# Patient Record
Sex: Male | Born: 1965 | Race: White | Hispanic: No | Marital: Married | Smoking: Never smoker
Health system: Southern US, Community
[De-identification: ages and names within clinical notes are randomized; demographics above are authoritative.]

## PROBLEM LIST (undated history)

## (undated) DIAGNOSIS — C801 Malignant (primary) neoplasm, unspecified: Secondary | ICD-10-CM

## (undated) DIAGNOSIS — E079 Disorder of thyroid, unspecified: Secondary | ICD-10-CM

## (undated) DIAGNOSIS — E119 Type 2 diabetes mellitus without complications: Secondary | ICD-10-CM

## (undated) DIAGNOSIS — E7800 Pure hypercholesterolemia, unspecified: Secondary | ICD-10-CM

## (undated) DIAGNOSIS — L509 Urticaria, unspecified: Secondary | ICD-10-CM

## (undated) DIAGNOSIS — C6290 Malignant neoplasm of unspecified testis, unspecified whether descended or undescended: Secondary | ICD-10-CM

## (undated) HISTORY — PX: ORCHIECTOMY: SHX2116

---

## 2020-04-17 ENCOUNTER — Emergency Department (HOSPITAL_BASED_OUTPATIENT_CLINIC_OR_DEPARTMENT_OTHER): Payer: BC Managed Care – PPO

## 2020-04-17 ENCOUNTER — Encounter (HOSPITAL_BASED_OUTPATIENT_CLINIC_OR_DEPARTMENT_OTHER): Payer: Self-pay | Admitting: Emergency Medicine

## 2020-04-17 ENCOUNTER — Other Ambulatory Visit: Payer: Self-pay

## 2020-04-17 ENCOUNTER — Emergency Department (HOSPITAL_BASED_OUTPATIENT_CLINIC_OR_DEPARTMENT_OTHER)
Admission: EM | Admit: 2020-04-17 | Discharge: 2020-04-17 | Disposition: A | Discharge status: Home / Self Care | Payer: BC Managed Care – PPO | Attending: Emergency Medicine | Admitting: Emergency Medicine

## 2020-04-17 DIAGNOSIS — S4991XA Unspecified injury of right shoulder and upper arm, initial encounter: Secondary | ICD-10-CM | POA: Diagnosis present

## 2020-04-17 DIAGNOSIS — S46911A Strain of unspecified muscle, fascia and tendon at shoulder and upper arm level, right arm, initial encounter: Secondary | ICD-10-CM | POA: Diagnosis not present

## 2020-04-17 DIAGNOSIS — X58XXXA Exposure to other specified factors, initial encounter: Secondary | ICD-10-CM | POA: Diagnosis not present

## 2020-04-17 DIAGNOSIS — Y92002 Bathroom of unspecified non-institutional (private) residence single-family (private) house as the place of occurrence of the external cause: Secondary | ICD-10-CM | POA: Diagnosis not present

## 2020-04-17 DIAGNOSIS — Z859 Personal history of malignant neoplasm, unspecified: Secondary | ICD-10-CM | POA: Insufficient documentation

## 2020-04-17 HISTORY — DX: Malignant (primary) neoplasm, unspecified: C80.1

## 2020-04-17 IMAGING — CR DG SHOULDER 2+V*R*
3 series · 3 of 3 positions shown · non-contrast
Comparison: None

CLINICAL DATA: Pain

EXAM:
RIGHT SHOULDER - 2+ VIEW

[w shoulder grashey right]
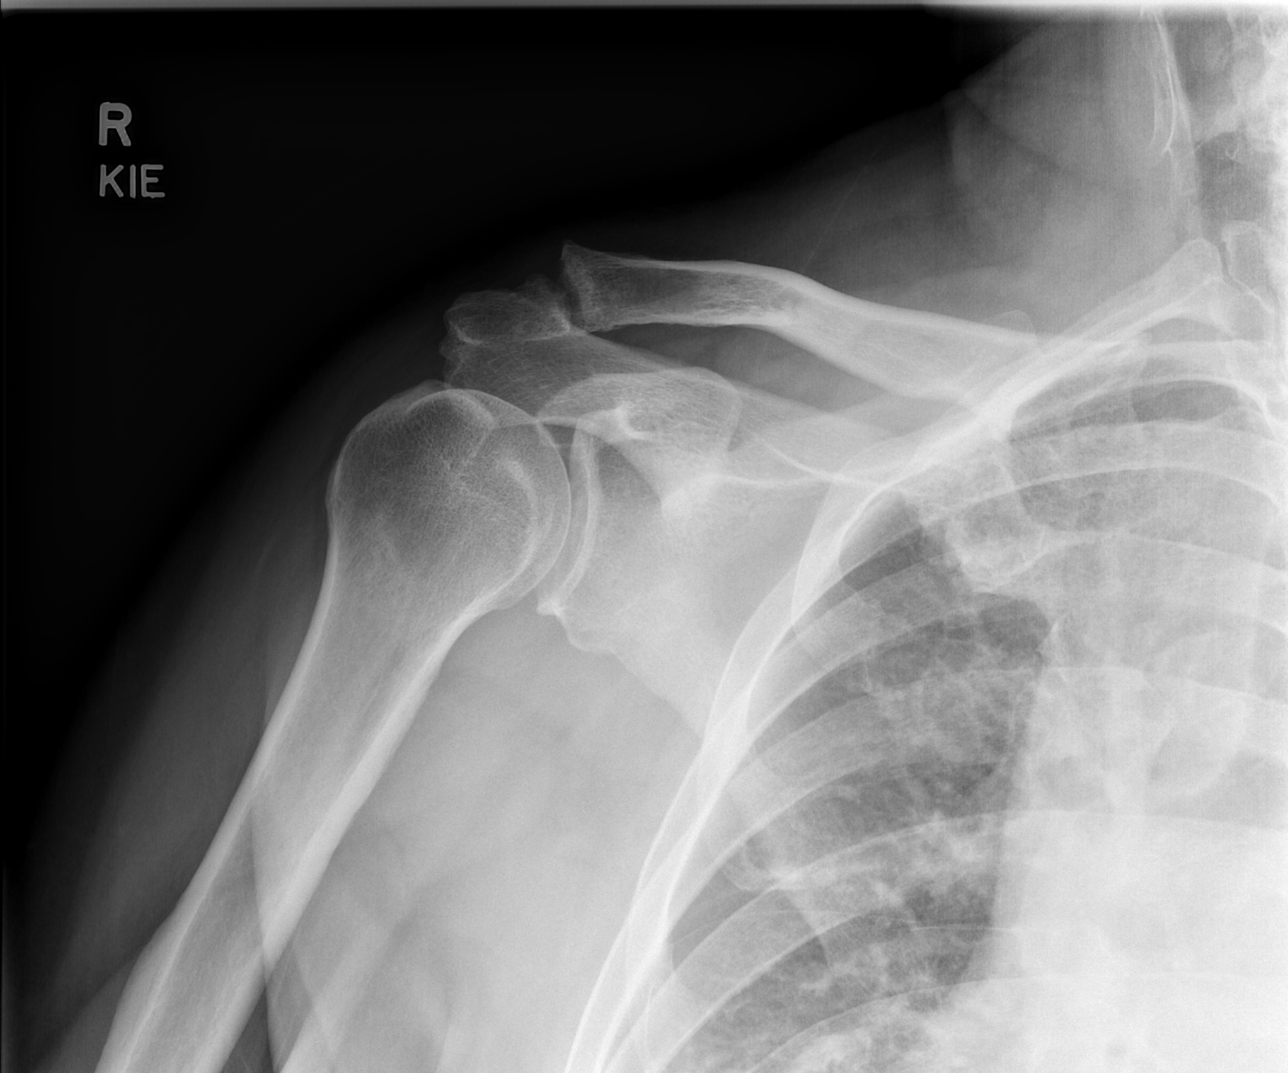

[w shoulder y view right]
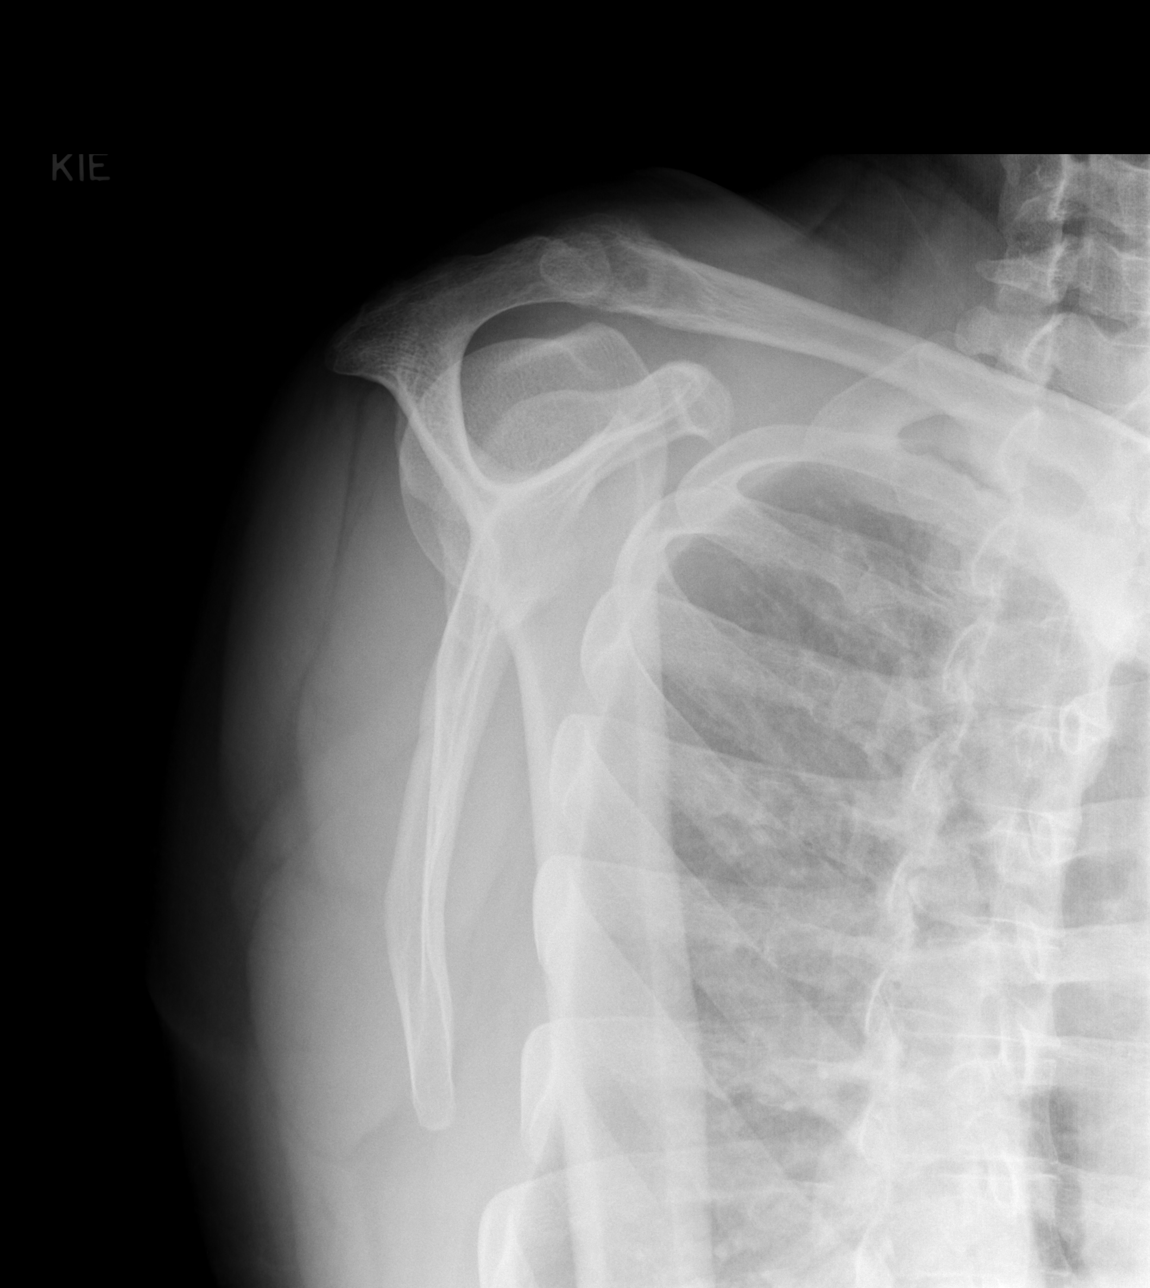

[w shoulder ap internal righ]
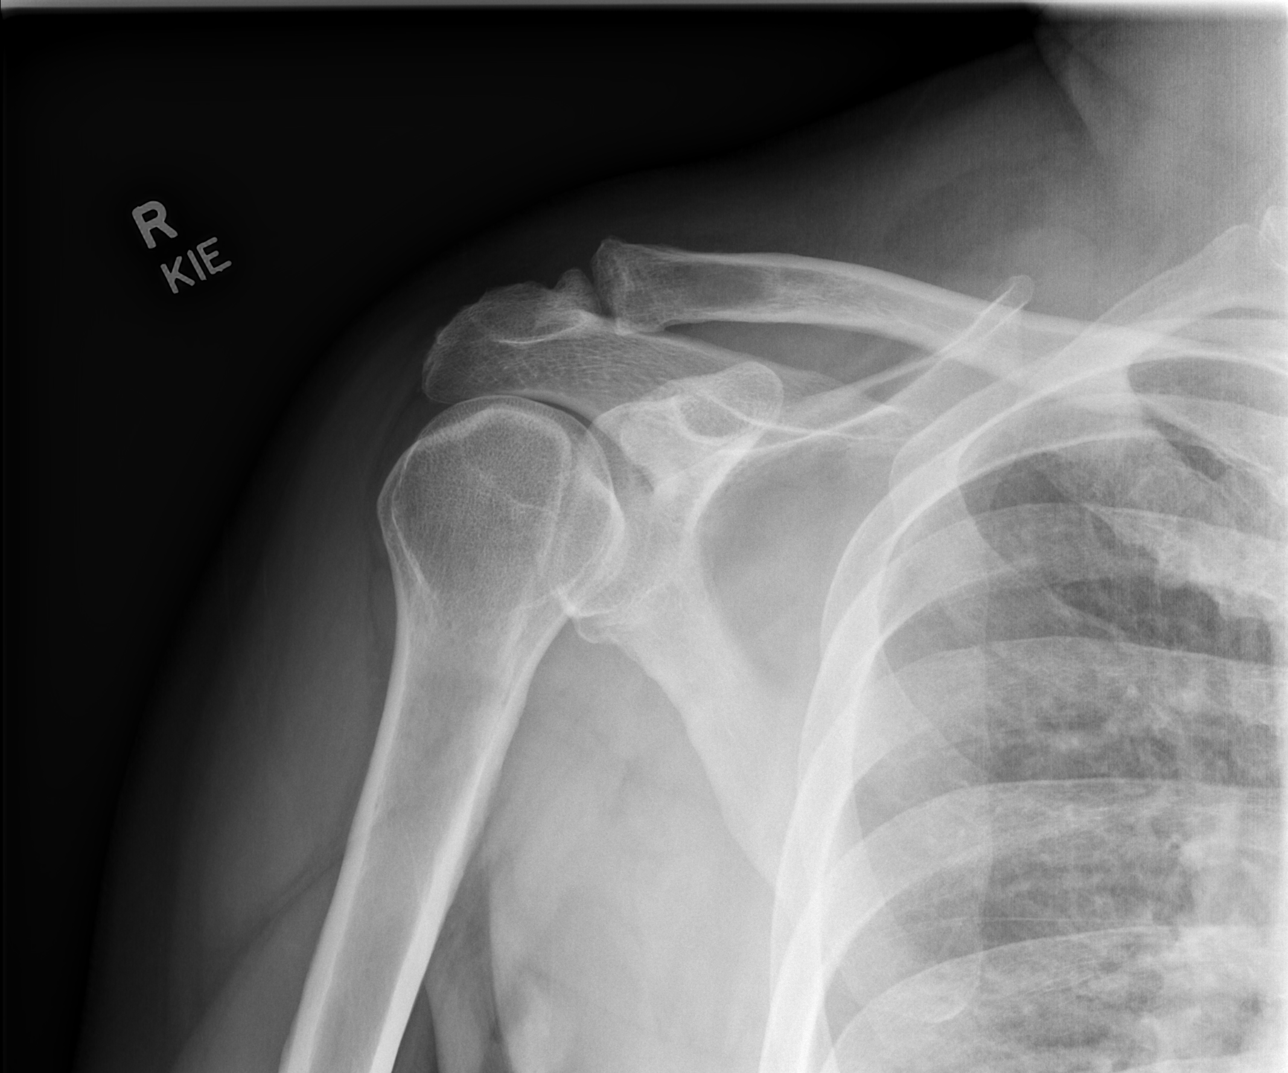

[3 of 3 positions shown; findings below may reference images not displayed]

FINDINGS: Frontal, oblique, and Y scapular images were obtained. No fracture
or dislocation. There is moderate generalized joint space narrowing.
No erosive change or intra-articular calcification. Visualized right
lung clear.
IMPRESSION: Moderate generalized osteoarthritic change. No fracture or
dislocation.

## 2020-04-17 MED ORDER — LIDOCAINE 5 % EX PTCH: 1.0000 | MEDICATED_PATCH | CUTANEOUS | 0 refills | Status: AC

## 2020-04-17 MED ORDER — HYDROCODONE-ACETAMINOPHEN 5-325 MG PO TABS
2.0000 | ORAL_TABLET | ORAL | 0 refills | Status: DC | PRN
Start: 2020-04-17 — End: 2020-04-22

## 2020-04-17 MED ORDER — LIDOCAINE 5 % EX PTCH
1.0000 | MEDICATED_PATCH | CUTANEOUS | Status: DC
  Administered 2020-04-17: 1 via TRANSDERMAL
  Filled 2020-04-17: qty 1

## 2020-04-17 MED ORDER — MELOXICAM 7.5 MG PO TABS: 7.5000 mg | ORAL_TABLET | Freq: Every day | ORAL | 0 refills | Status: DC

## 2020-04-17 MED ORDER — METHOCARBAMOL 500 MG PO TABS: 500.0000 mg | ORAL_TABLET | Freq: Two times a day (BID) | ORAL | 0 refills | Status: DC

## 2020-04-17 MED ORDER — HYDROCODONE-ACETAMINOPHEN 5-325 MG PO TABS
1.0000 | ORAL_TABLET | Freq: Once | ORAL | Status: AC
  Administered 2020-04-17: 1 via ORAL
  Filled 2020-04-17: qty 1

## 2020-04-17 NOTE — ED Provider Notes (Signed)
[facility] Provider Note   CSN: [id number] Arrival date & time: 04/17/20  1057     History Chief Complaint  Patient presents with  . Shoulder Pain    Jonathan Shaffer is a 54 y.o. male.  54 year old male presents with complaint of right shoulder pain.  Patient states that he had a stomach bug last week and was leaning over the toilet holding onto the toilet with both arms.  Patient woke up the next day with muscle soreness in his abdomen as well as in his right shoulder.  Patient states the muscle discomfort in his abdomen has since totally resolved and he has no more GI symptoms however continues to have pain in his right shoulder.  Patient is taking ibuprofen without improvement in his symptoms, pain is worse with palpation around the shoulder as well as with any range of motion of the shoulder.  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        Past Medical History:  Diagnosis Date  . Cancer ([place])     There are no problems to display for this patient.   History reviewed. No pertinent surgical history.     No family history on file.  Social History   Tobacco Use  . Smoking status: Never Smoker  . Smokeless tobacco: Never Used  Substance Use Topics  . Alcohol use: Yes  . Drug use: Never    Home Medications Prior to Admission medications   Medication Sig Start Date End Date Taking? Authorizing Provider  HYDROcodone-acetaminophen (NORCO/VICODIN) 5-325 MG tablet Take 2 tablets by mouth every 4 (four) hours as needed. 04/17/20   Tacy Learn, PA-C  lidocaine (LIDODERM) 5 % Place 1 patch onto the skin daily. Remove & Discard patch within 12 hours or as directed by MD 04/17/20   Tacy Learn, PA-C  meloxicam (MOBIC) 7.5 MG tablet Take 1 tablet (7.5 mg total) by mouth daily for 10 days. 04/17/20 04/23/2020  Tacy Learn, PA-C  methocarbamol (ROBAXIN) 500 MG tablet Take 1 tablet (500 mg total) by mouth 2 (two) times daily. 04/17/20   Tacy Learn, PA-C    Allergies    Patient has no known allergies.  Review of Systems   Review of Systems  Constitutional: Negative for fever.  Respiratory: Negative for shortness of breath.   Cardiovascular: Negative for chest pain.  Gastrointestinal: Negative for abdominal pain, diarrhea, nausea and vomiting.  Musculoskeletal: Positive for arthralgias, myalgias and neck pain.  Skin: Negative for rash and wound.  Allergic/Immunologic: Negative for immunocompromised state.  Neurological: Negative for numbness.  All other systems reviewed and are negative.   Physical Exam Updated Vital Signs BP (!) 139/91 (BP Location: Left Arm)   Pulse 93   Temp 99.2 F (37.3 C) (Oral)   Resp 18   Ht 6\' 3"  (1.905 m)   Wt 117.9 kg   SpO2 98%   BMI 32.50 kg/m   Physical Exam Vitals and nursing note reviewed.  Constitutional:      General: He is not in acute distress.    Appearance: He is well-developed. He is not diaphoretic.  HENT:     Head: Normocephalic and atraumatic.  Cardiovascular:     Pulses: Normal pulses.  Pulmonary:     Effort: Pulmonary effort is normal.  Musculoskeletal:        General: Tenderness present. No  swelling.     Right shoulder: Tenderness present. No swelling, deformity, bony tenderness or crepitus. Decreased range of motion.     Cervical back: No bony tenderness.     Thoracic back: No tenderness or bony tenderness.       Back:     Comments: generalized tenderness of right shoulder, specifically along the right cervical paraspinous and right trapezius along medial border of right scapula and right AC.  Pain is worse with range of motion of the right shoulder, unable to internally or externally rotate secondary to pain, range of motion all feels significantly limited active and passive range of motion.  Skin:    General: Skin is warm  and dry.     Findings: No erythema or rash.  Neurological:     Mental Status: He is alert and oriented to person, place, and time.  Psychiatric:        Behavior: Behavior normal.     ED Results / Procedures / Treatments   Labs (all labs ordered are listed, but only abnormal results are displayed) Labs Reviewed - No data to display  EKG None  Radiology DG Shoulder Right  Result Date: 04/17/2020 CLINICAL DATA:  Pain EXAM: RIGHT SHOULDER - 2+ VIEW COMPARISON:  None FINDINGS: Frontal, oblique, and Y scapular images were obtained. No fracture or dislocation. There is moderate generalized joint space narrowing. No erosive change or intra-articular calcification. Visualized right lung clear. IMPRESSION: Moderate generalized osteoarthritic change. No fracture or dislocation. Electronically Signed   By: Lowella Grip III M.D.   On: 04/17/2020 11:50    Procedures Procedures (including critical care time)  Medications Ordered in ED Medications  lidocaine (LIDODERM) 5 % 1 patch (1 patch Transdermal Patch Applied 04/17/20 1243)  HYDROcodone-acetaminophen (NORCO/VICODIN) 5-325 MG per tablet 1 tablet (1 tablet Oral Given 04/17/20 1240)    ED Course  I have reviewed the triage vital signs and the nursing notes.  Pertinent labs & imaging results that were available during my care of the patient were reviewed by me and considered in my medical decision making (see chart for details).  Clinical Course as of Apr 17 1253  Nancy Fetter Apr 17, 5069  [id number] 54 year old male with complaint of right shoulder pain, not improving with rest, sling, ibuprofen at home. X-ray shows arthritic changes without dislocation or fracture.  Found to have tenderness general right shoulder as well as right trapezius and right paraspinous muscles, pain significantly in its range of motion. Plan is for Robaxin and meloxicam at home as well as Lidoderm patches.  Given Norco for limited use for pain not relieved with Robaxin  and meloxicam.  Advised to use Colace for constipation.  Recommend warm compresses followed by gentle range of motion exercises as discussed.  Recommend early follow-up with sports medicine, discussed potential to develop adhesive capsulitis. Patient has a sports medicine provider and will contact them tomorrow for follow-up.   [LM]    Clinical Course User Index [LM] Roque Lias   MDM Rules/Calculators/A&P                          Final Clinical Impression(s) / ED Diagnoses Final diagnoses:  Strain of right shoulder, initial encounter    Rx / DC Orders ED Discharge Orders         Ordered    HYDROcodone-acetaminophen (NORCO/VICODIN) 5-325 MG tablet  Every 4 hours PRN        04/17/20 1227  lidocaine (LIDODERM) 5 %  Every 24 hours        04/17/20 1227    methocarbamol (ROBAXIN) 500 MG tablet  2 times daily        04/17/20 1227    meloxicam (MOBIC) 7.5 MG tablet  Daily        04/17/20 1227           Tacy Learn, PA-C 04/17/20 1254    Wyvonnia Dusky, MD 04/17/20 1729

## 2020-04-17 NOTE — Discharge Instructions (Addendum)
Take meloxicam daily as prescribed.  Do not take other NSAIDs while taking meloxicam. Take Robaxin as needed as prescribed, this is a muscle relaxer, do not drive or operate machinery taking Robaxin. Take Norco for pain not controlled with your meloxicam and Robaxin.  Do not drive or operate machinery while taking Norco.  Muscle relaxers and narcotic pain medications can cause constipation, take Colace if taking these medications. Apply Lidoderm patch as needed as prescribed. Recommend warm compresses for 20 minutes followed by gentle stretching as discussed. Follow-up with your sports medicine provider.

## 2020-04-17 NOTE — ED Triage Notes (Signed)
States he has had shoulder pain for several days. No known injury but states he had a GI bug and possibly hurt it while vomiting. He is wearing a sling.

## 2020-04-19 ENCOUNTER — Other Ambulatory Visit: Payer: Self-pay

## 2020-04-19 ENCOUNTER — Ambulatory Visit (INDEPENDENT_AMBULATORY_CARE_PROVIDER_SITE_OTHER): Payer: BC Managed Care – PPO | Admitting: Family Medicine

## 2020-04-19 ENCOUNTER — Encounter: Payer: Self-pay | Admitting: Family Medicine

## 2020-04-19 ENCOUNTER — Ambulatory Visit: Payer: BC Managed Care – PPO

## 2020-04-19 DIAGNOSIS — M25511 Pain in right shoulder: Secondary | ICD-10-CM | POA: Diagnosis not present

## 2020-04-19 MED ORDER — HYDROCODONE-ACETAMINOPHEN 5-325 MG PO TABS
1.0000 | ORAL_TABLET | Freq: Every evening | ORAL | 0 refills | Status: DC | PRN
Start: 2020-04-19 — End: 2020-04-22

## 2020-04-19 MED ORDER — TRAMADOL HCL 50 MG PO TABS
50.0000 mg | ORAL_TABLET | Freq: Four times a day (QID) | ORAL | 0 refills | Status: AC | PRN
Start: 2020-04-19 — End: ?

## 2020-04-19 NOTE — Progress Notes (Signed)
   Office Visit Note   Patient: Jonathan Shaffer           Date of Birth: 1966/05/22           MRN: [id number] Visit Date: 04/19/2020 Requested by: No referring provider defined for this encounter. PCP: System, Provider Not In  Subjective: Chief Complaint  Patient presents with  . Right Shoulder - Pain    HPI: He is here with right shoulder pain. He is right-hand dominant. Last week he ate some seafood, developed food poisoning and vomited violently for about 24 hours. Then everything seemed to be okay, but a day later he started noticing pain in his right shoulder which has progressively worsened. Now he can hardly move it because of pain. Pain seems to be mostly on the anterior aspect. 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 He was given some medications which have not eliminated his pain. Denies any neck pain, denies any numbness or weakness in his arm.               ROS:   All other systems were reviewed and are negative.  Objective: Vital Signs: There were no vitals taken for this visit.  Physical Exam:  General:  Alert and oriented, in no acute distress. Pulm:  Breathing unlabored. Psy:  Normal mood, congruent affect. Skin: No erythema or rash Right shoulder: Very limited active range of motion because of pain. Passive range of motion hurts equally. He is tender over the [facility] joint and in the lateral and posterior subacromial spaces. Diffusely tender around the shoulder. Unable to test rotator cuff strength due to pain.  Imaging: US Guided Needle Placement  Result Date: 04/19/2020 I briefly attempted diagnostic imaging but he was unable to tolerate positioning. Ultrasound guided injection is preferred based studies that show increased duration, increased effect, greater accuracy, decreased procedural pain, increased response rate, and decreased cost with ultrasound guided versus blind injection.   Verbal informed consent  obtained.  Time-out conducted.  Noted no overlying erythema, induration, or other signs of local infection. Ultrasound-guided right glenohumeral injection: After sterile prep with Betadine, injected 8 cc 1% lidocaine without epinephrine and 40 mg methylprednisolone using a 22-gauge spinal needle, passing the needle from posterior approach into the glenohumeral joint. Injectate was seen filling the joint capsule. He had very good immediate relief.    Assessment & Plan: 1. Acute right shoulder pain, suspect inflammatory reaction due to recent illness -Discussed options with him and elected to do an ultrasound-guided intra-articular injection today. He will follow-up as needed. MRI scan if he fails to improve.     Procedures: No procedures performed  No notes on file     PMFS History: There are no problems to display for this patient.  Past Medical History:  Diagnosis Date  . Cancer [facility])     No family history on file.  No past surgical history on file. Social History   Occupational History  . Not on file  Tobacco Use  . Smoking status: Never Smoker  . Smokeless tobacco: Never Used  Substance and Sexual Activity  . Alcohol use: Yes  . Drug use: Never  . Sexual activity: Not on file

## 2020-04-21 ENCOUNTER — Other Ambulatory Visit: Payer: Self-pay | Admitting: Family Medicine

## 2020-04-21 MED ORDER — PREDNISONE 10 MG PO TABS: ORAL_TABLET | ORAL | 0 refills | Status: AC

## 2020-04-22 ENCOUNTER — Emergency Department (HOSPITAL_BASED_OUTPATIENT_CLINIC_OR_DEPARTMENT_OTHER): Payer: BC Managed Care – PPO

## 2020-04-22 ENCOUNTER — Inpatient Hospital Stay: Payer: Self-pay

## 2020-04-22 ENCOUNTER — Other Ambulatory Visit: Payer: Self-pay

## 2020-04-22 ENCOUNTER — Inpatient Hospital Stay (HOSPITAL_BASED_OUTPATIENT_CLINIC_OR_DEPARTMENT_OTHER)
Admission: EM | Admit: 2020-04-22 | Discharge: 2020-06-04 | DRG: 853 | Disposition: E | Payer: BC Managed Care – PPO | Attending: Cardiothoracic Surgery | Admitting: Cardiothoracic Surgery

## 2020-04-22 ENCOUNTER — Encounter (HOSPITAL_BASED_OUTPATIENT_CLINIC_OR_DEPARTMENT_OTHER): Payer: Self-pay | Admitting: *Deleted

## 2020-04-22 ENCOUNTER — Inpatient Hospital Stay (HOSPITAL_COMMUNITY): Payer: BC Managed Care – PPO

## 2020-04-22 DIAGNOSIS — E1152 Type 2 diabetes mellitus with diabetic peripheral angiopathy with gangrene: Secondary | ICD-10-CM | POA: Diagnosis present

## 2020-04-22 DIAGNOSIS — L270 Generalized skin eruption due to drugs and medicaments taken internally: Secondary | ICD-10-CM | POA: Diagnosis not present

## 2020-04-22 DIAGNOSIS — R7881 Bacteremia: Secondary | ICD-10-CM | POA: Diagnosis not present

## 2020-04-22 DIAGNOSIS — E440 Moderate protein-calorie malnutrition: Secondary | ICD-10-CM | POA: Diagnosis not present

## 2020-04-22 DIAGNOSIS — F05 Delirium due to known physiological condition: Secondary | ICD-10-CM | POA: Diagnosis not present

## 2020-04-22 DIAGNOSIS — M549 Dorsalgia, unspecified: Secondary | ICD-10-CM | POA: Diagnosis not present

## 2020-04-22 DIAGNOSIS — E872 Acidosis: Secondary | ICD-10-CM | POA: Diagnosis present

## 2020-04-22 DIAGNOSIS — R578 Other shock: Secondary | ICD-10-CM | POA: Diagnosis not present

## 2020-04-22 DIAGNOSIS — D689 Coagulation defect, unspecified: Secondary | ICD-10-CM | POA: Diagnosis present

## 2020-04-22 DIAGNOSIS — D75838 Other thrombocytosis: Secondary | ICD-10-CM | POA: Diagnosis not present

## 2020-04-22 DIAGNOSIS — N170 Acute kidney failure with tubular necrosis: Secondary | ICD-10-CM | POA: Diagnosis present

## 2020-04-22 DIAGNOSIS — J9811 Atelectasis: Secondary | ICD-10-CM | POA: Diagnosis not present

## 2020-04-22 DIAGNOSIS — I495 Sick sinus syndrome: Secondary | ICD-10-CM | POA: Diagnosis not present

## 2020-04-22 DIAGNOSIS — A419 Sepsis, unspecified organism: Secondary | ICD-10-CM | POA: Diagnosis not present

## 2020-04-22 DIAGNOSIS — R609 Edema, unspecified: Secondary | ICD-10-CM | POA: Diagnosis not present

## 2020-04-22 DIAGNOSIS — L02413 Cutaneous abscess of right upper limb: Secondary | ICD-10-CM

## 2020-04-22 DIAGNOSIS — E1110 Type 2 diabetes mellitus with ketoacidosis without coma: Secondary | ICD-10-CM | POA: Diagnosis present

## 2020-04-22 DIAGNOSIS — D72829 Elevated white blood cell count, unspecified: Secondary | ICD-10-CM | POA: Diagnosis not present

## 2020-04-22 DIAGNOSIS — Y92239 Unspecified place in hospital as the place of occurrence of the external cause: Secondary | ICD-10-CM | POA: Diagnosis not present

## 2020-04-22 DIAGNOSIS — I823 Embolism and thrombosis of renal vein: Secondary | ICD-10-CM | POA: Diagnosis not present

## 2020-04-22 DIAGNOSIS — R6520 Severe sepsis without septic shock: Secondary | ICD-10-CM | POA: Diagnosis not present

## 2020-04-22 DIAGNOSIS — I468 Cardiac arrest due to other underlying condition: Secondary | ICD-10-CM | POA: Diagnosis not present

## 2020-04-22 DIAGNOSIS — G9341 Metabolic encephalopathy: Secondary | ICD-10-CM | POA: Diagnosis present

## 2020-04-22 DIAGNOSIS — Z791 Long term (current) use of non-steroidal anti-inflammatories (NSAID): Secondary | ICD-10-CM

## 2020-04-22 DIAGNOSIS — M60011 Infective myositis, right shoulder: Secondary | ICD-10-CM | POA: Diagnosis present

## 2020-04-22 DIAGNOSIS — L03114 Cellulitis of left upper limb: Secondary | ICD-10-CM | POA: Diagnosis present

## 2020-04-22 DIAGNOSIS — E875 Hyperkalemia: Secondary | ICD-10-CM | POA: Diagnosis not present

## 2020-04-22 DIAGNOSIS — R6521 Severe sepsis with septic shock: Secondary | ICD-10-CM | POA: Diagnosis present

## 2020-04-22 DIAGNOSIS — M62830 Muscle spasm of back: Secondary | ICD-10-CM | POA: Diagnosis not present

## 2020-04-22 DIAGNOSIS — E871 Hypo-osmolality and hyponatremia: Secondary | ICD-10-CM | POA: Diagnosis present

## 2020-04-22 DIAGNOSIS — I130 Hypertensive heart and chronic kidney disease with heart failure and stage 1 through stage 4 chronic kidney disease, or unspecified chronic kidney disease: Secondary | ICD-10-CM | POA: Diagnosis present

## 2020-04-22 DIAGNOSIS — J95821 Acute postprocedural respiratory failure: Secondary | ICD-10-CM | POA: Diagnosis not present

## 2020-04-22 DIAGNOSIS — I330 Acute and subacute infective endocarditis: Secondary | ICD-10-CM | POA: Diagnosis present

## 2020-04-22 DIAGNOSIS — I4891 Unspecified atrial fibrillation: Secondary | ICD-10-CM | POA: Diagnosis not present

## 2020-04-22 DIAGNOSIS — R52 Pain, unspecified: Secondary | ICD-10-CM

## 2020-04-22 DIAGNOSIS — E876 Hypokalemia: Secondary | ICD-10-CM | POA: Diagnosis not present

## 2020-04-22 DIAGNOSIS — Z20822 Contact with and (suspected) exposure to covid-19: Secondary | ICD-10-CM | POA: Diagnosis present

## 2020-04-22 DIAGNOSIS — I76 Septic arterial embolism: Secondary | ICD-10-CM | POA: Diagnosis present

## 2020-04-22 DIAGNOSIS — J9601 Acute respiratory failure with hypoxia: Secondary | ICD-10-CM | POA: Diagnosis not present

## 2020-04-22 DIAGNOSIS — J80 Acute respiratory distress syndrome: Secondary | ICD-10-CM | POA: Diagnosis not present

## 2020-04-22 DIAGNOSIS — I313 Pericardial effusion (noninflammatory): Secondary | ICD-10-CM | POA: Diagnosis not present

## 2020-04-22 DIAGNOSIS — E860 Dehydration: Secondary | ICD-10-CM | POA: Diagnosis present

## 2020-04-22 DIAGNOSIS — N179 Acute kidney failure, unspecified: Secondary | ICD-10-CM | POA: Diagnosis not present

## 2020-04-22 DIAGNOSIS — I748 Embolism and thrombosis of other arteries: Secondary | ICD-10-CM | POA: Diagnosis not present

## 2020-04-22 DIAGNOSIS — B9561 Methicillin susceptible Staphylococcus aureus infection as the cause of diseases classified elsewhere: Secondary | ICD-10-CM

## 2020-04-22 DIAGNOSIS — I472 Ventricular tachycardia: Secondary | ICD-10-CM | POA: Diagnosis not present

## 2020-04-22 DIAGNOSIS — M868X1 Other osteomyelitis, shoulder: Secondary | ICD-10-CM | POA: Diagnosis present

## 2020-04-22 DIAGNOSIS — D735 Infarction of spleen: Secondary | ICD-10-CM | POA: Diagnosis not present

## 2020-04-22 DIAGNOSIS — R410 Disorientation, unspecified: Secondary | ICD-10-CM | POA: Diagnosis not present

## 2020-04-22 DIAGNOSIS — R509 Fever, unspecified: Secondary | ICD-10-CM

## 2020-04-22 DIAGNOSIS — I059 Rheumatic mitral valve disease, unspecified: Secondary | ICD-10-CM

## 2020-04-22 DIAGNOSIS — I6310 Cerebral infarction due to embolism of unspecified precerebral artery: Secondary | ICD-10-CM | POA: Diagnosis not present

## 2020-04-22 DIAGNOSIS — J690 Pneumonitis due to inhalation of food and vomit: Secondary | ICD-10-CM | POA: Diagnosis not present

## 2020-04-22 DIAGNOSIS — L989 Disorder of the skin and subcutaneous tissue, unspecified: Secondary | ICD-10-CM

## 2020-04-22 DIAGNOSIS — E785 Hyperlipidemia, unspecified: Secondary | ICD-10-CM | POA: Diagnosis present

## 2020-04-22 DIAGNOSIS — Z8547 Personal history of malignant neoplasm of testis: Secondary | ICD-10-CM

## 2020-04-22 DIAGNOSIS — M00011 Staphylococcal arthritis, right shoulder: Secondary | ICD-10-CM | POA: Diagnosis not present

## 2020-04-22 DIAGNOSIS — Z452 Encounter for adjustment and management of vascular access device: Secondary | ICD-10-CM

## 2020-04-22 DIAGNOSIS — J810 Acute pulmonary edema: Secondary | ICD-10-CM | POA: Diagnosis not present

## 2020-04-22 DIAGNOSIS — E441 Mild protein-calorie malnutrition: Secondary | ICD-10-CM | POA: Diagnosis not present

## 2020-04-22 DIAGNOSIS — E11649 Type 2 diabetes mellitus with hypoglycemia without coma: Secondary | ICD-10-CM | POA: Diagnosis not present

## 2020-04-22 DIAGNOSIS — D6489 Other specified anemias: Secondary | ICD-10-CM | POA: Diagnosis present

## 2020-04-22 DIAGNOSIS — K567 Ileus, unspecified: Secondary | ICD-10-CM

## 2020-04-22 DIAGNOSIS — Z8249 Family history of ischemic heart disease and other diseases of the circulatory system: Secondary | ICD-10-CM

## 2020-04-22 DIAGNOSIS — I272 Pulmonary hypertension, unspecified: Secondary | ICD-10-CM | POA: Diagnosis present

## 2020-04-22 DIAGNOSIS — E1169 Type 2 diabetes mellitus with other specified complication: Secondary | ICD-10-CM | POA: Diagnosis present

## 2020-04-22 DIAGNOSIS — R001 Bradycardia, unspecified: Secondary | ICD-10-CM | POA: Diagnosis not present

## 2020-04-22 DIAGNOSIS — Z66 Do not resuscitate: Secondary | ICD-10-CM | POA: Diagnosis not present

## 2020-04-22 DIAGNOSIS — E11621 Type 2 diabetes mellitus with foot ulcer: Secondary | ICD-10-CM | POA: Diagnosis present

## 2020-04-22 DIAGNOSIS — M009 Pyogenic arthritis, unspecified: Secondary | ICD-10-CM | POA: Diagnosis present

## 2020-04-22 DIAGNOSIS — M7551 Bursitis of right shoulder: Secondary | ICD-10-CM | POA: Diagnosis present

## 2020-04-22 DIAGNOSIS — T360X5A Adverse effect of penicillins, initial encounter: Secondary | ICD-10-CM | POA: Diagnosis not present

## 2020-04-22 DIAGNOSIS — J90 Pleural effusion, not elsewhere classified: Secondary | ICD-10-CM | POA: Diagnosis not present

## 2020-04-22 DIAGNOSIS — R109 Unspecified abdominal pain: Secondary | ICD-10-CM

## 2020-04-22 DIAGNOSIS — E1310 Other specified diabetes mellitus with ketoacidosis without coma: Secondary | ICD-10-CM | POA: Diagnosis not present

## 2020-04-22 DIAGNOSIS — R739 Hyperglycemia, unspecified: Secondary | ICD-10-CM | POA: Diagnosis not present

## 2020-04-22 DIAGNOSIS — Z6837 Body mass index (BMI) 37.0-37.9, adult: Secondary | ICD-10-CM

## 2020-04-22 DIAGNOSIS — E878 Other disorders of electrolyte and fluid balance, not elsewhere classified: Secondary | ICD-10-CM | POA: Diagnosis not present

## 2020-04-22 DIAGNOSIS — M7989 Other specified soft tissue disorders: Secondary | ICD-10-CM | POA: Diagnosis not present

## 2020-04-22 DIAGNOSIS — Z9889 Other specified postprocedural states: Secondary | ICD-10-CM

## 2020-04-22 DIAGNOSIS — I5021 Acute systolic (congestive) heart failure: Secondary | ICD-10-CM | POA: Diagnosis not present

## 2020-04-22 DIAGNOSIS — D62 Acute posthemorrhagic anemia: Secondary | ICD-10-CM | POA: Diagnosis not present

## 2020-04-22 DIAGNOSIS — I742 Embolism and thrombosis of arteries of the upper extremities: Secondary | ICD-10-CM | POA: Diagnosis not present

## 2020-04-22 DIAGNOSIS — M479 Spondylosis, unspecified: Secondary | ICD-10-CM | POA: Diagnosis present

## 2020-04-22 DIAGNOSIS — I743 Embolism and thrombosis of arteries of the lower extremities: Secondary | ICD-10-CM | POA: Diagnosis not present

## 2020-04-22 DIAGNOSIS — A4101 Sepsis due to Methicillin susceptible Staphylococcus aureus: Secondary | ICD-10-CM | POA: Diagnosis present

## 2020-04-22 DIAGNOSIS — K7200 Acute and subacute hepatic failure without coma: Secondary | ICD-10-CM | POA: Diagnosis present

## 2020-04-22 DIAGNOSIS — N1831 Chronic kidney disease, stage 3a: Secondary | ICD-10-CM | POA: Diagnosis present

## 2020-04-22 DIAGNOSIS — L03818 Cellulitis of other sites: Secondary | ICD-10-CM | POA: Diagnosis not present

## 2020-04-22 DIAGNOSIS — Z01818 Encounter for other preprocedural examination: Secondary | ICD-10-CM

## 2020-04-22 DIAGNOSIS — I455 Other specified heart block: Secondary | ICD-10-CM | POA: Diagnosis present

## 2020-04-22 DIAGNOSIS — I872 Venous insufficiency (chronic) (peripheral): Secondary | ICD-10-CM | POA: Diagnosis present

## 2020-04-22 DIAGNOSIS — I340 Nonrheumatic mitral (valve) insufficiency: Secondary | ICD-10-CM | POA: Diagnosis not present

## 2020-04-22 DIAGNOSIS — I38 Endocarditis, valve unspecified: Secondary | ICD-10-CM | POA: Diagnosis not present

## 2020-04-22 DIAGNOSIS — D649 Anemia, unspecified: Secondary | ICD-10-CM | POA: Diagnosis not present

## 2020-04-22 DIAGNOSIS — I2510 Atherosclerotic heart disease of native coronary artery without angina pectoris: Secondary | ICD-10-CM | POA: Diagnosis present

## 2020-04-22 DIAGNOSIS — E1122 Type 2 diabetes mellitus with diabetic chronic kidney disease: Secondary | ICD-10-CM | POA: Diagnosis present

## 2020-04-22 DIAGNOSIS — L97429 Non-pressure chronic ulcer of left heel and midfoot with unspecified severity: Secondary | ICD-10-CM | POA: Diagnosis present

## 2020-04-22 DIAGNOSIS — L988 Other specified disorders of the skin and subcutaneous tissue: Secondary | ICD-10-CM | POA: Diagnosis not present

## 2020-04-22 DIAGNOSIS — L0291 Cutaneous abscess, unspecified: Secondary | ICD-10-CM

## 2020-04-22 DIAGNOSIS — I081 Rheumatic disorders of both mitral and tricuspid valves: Secondary | ICD-10-CM | POA: Diagnosis present

## 2020-04-22 DIAGNOSIS — J9690 Respiratory failure, unspecified, unspecified whether with hypoxia or hypercapnia: Secondary | ICD-10-CM

## 2020-04-22 DIAGNOSIS — Z833 Family history of diabetes mellitus: Secondary | ICD-10-CM

## 2020-04-22 DIAGNOSIS — Z79899 Other long term (current) drug therapy: Secondary | ICD-10-CM

## 2020-04-22 DIAGNOSIS — G4700 Insomnia, unspecified: Secondary | ICD-10-CM | POA: Diagnosis not present

## 2020-04-22 DIAGNOSIS — Z9079 Acquired absence of other genital organ(s): Secondary | ICD-10-CM | POA: Diagnosis not present

## 2020-04-22 DIAGNOSIS — E7800 Pure hypercholesterolemia, unspecified: Secondary | ICD-10-CM | POA: Diagnosis present

## 2020-04-22 HISTORY — DX: Pure hypercholesterolemia, unspecified: E78.00

## 2020-04-22 HISTORY — DX: Type 2 diabetes mellitus without complications: E11.9

## 2020-04-22 HISTORY — DX: Malignant neoplasm of unspecified testis, unspecified whether descended or undescended: C62.90

## 2020-04-22 HISTORY — DX: Disorder of thyroid, unspecified: E07.9

## 2020-04-22 HISTORY — DX: Urticaria, unspecified: L50.9

## 2020-04-22 LAB — CBC WITH DIFFERENTIAL/PLATELET
Abs Immature Granulocytes: 0.41 10*3/uL — ABNORMAL HIGH (ref 0.00–0.07)
Basophils Absolute: 0 10*3/uL (ref 0.0–0.1)
Basophils Relative: 0 %
Eosinophils Absolute: 0.1 10*3/uL (ref 0.0–0.5)
Eosinophils Relative: 0 %
HCT: 41.6 % (ref 39.0–52.0)
Hemoglobin: 14 g/dL (ref 13.0–17.0)
Immature Granulocytes: 2 %
Lymphocytes Relative: 4 %
Lymphs Abs: 0.8 10*3/uL (ref 0.7–4.0)
MCH: 30 pg (ref 26.0–34.0)
MCHC: 33.7 g/dL (ref 30.0–36.0)
MCV: 89.1 fL (ref 80.0–100.0)
Monocytes Absolute: 0.5 10*3/uL (ref 0.1–1.0)
Monocytes Relative: 3 %
Neutro Abs: 19.5 10*3/uL — ABNORMAL HIGH (ref 1.7–7.7)
Neutrophils Relative %: 91 %
Platelets: 191 10*3/uL (ref 150–400)
RBC: 4.67 MIL/uL (ref 4.22–5.81)
RDW: 14 % (ref 11.5–15.5)
Smear Review: NORMAL
WBC: 21.3 10*3/uL — ABNORMAL HIGH (ref 4.0–10.5)
nRBC: 0 % (ref 0.0–0.2)

## 2020-04-22 LAB — GLUCOSE, CAPILLARY
Glucose-Capillary: 381 mg/dL — ABNORMAL HIGH (ref 70–99)
Glucose-Capillary: 322 mg/dL — ABNORMAL HIGH (ref 70–99)
Glucose-Capillary: 227 mg/dL — ABNORMAL HIGH (ref 70–99)
Glucose-Capillary: 272 mg/dL — ABNORMAL HIGH (ref 70–99)
Glucose-Capillary: 234 mg/dL — ABNORMAL HIGH (ref 70–99)
Glucose-Capillary: 243 mg/dL — ABNORMAL HIGH (ref 70–99)
Glucose-Capillary: 248 mg/dL — ABNORMAL HIGH (ref 70–99)
Glucose-Capillary: 246 mg/dL — ABNORMAL HIGH (ref 70–99)
Glucose-Capillary: 255 mg/dL — ABNORMAL HIGH (ref 70–99)
Glucose-Capillary: 252 mg/dL — ABNORMAL HIGH (ref 70–99)
Glucose-Capillary: 257 mg/dL — ABNORMAL HIGH (ref 70–99)
Glucose-Capillary: 238 mg/dL — ABNORMAL HIGH (ref 70–99)
Glucose-Capillary: 254 mg/dL — ABNORMAL HIGH (ref 70–99)
Glucose-Capillary: 168 mg/dL — ABNORMAL HIGH (ref 70–99)
Glucose-Capillary: 165 mg/dL — ABNORMAL HIGH (ref 70–99)
Glucose-Capillary: 187 mg/dL — ABNORMAL HIGH (ref 70–99)
Glucose-Capillary: 182 mg/dL — ABNORMAL HIGH (ref 70–99)

## 2020-04-22 LAB — BASIC METABOLIC PANEL
Anion gap: 17 — ABNORMAL HIGH (ref 5–15)
Anion gap: 14 (ref 5–15)
Anion gap: 12 (ref 5–15)
Anion gap: 9 (ref 5–15)
Anion gap: 9 (ref 5–15)
BUN: 29 mg/dL — ABNORMAL HIGH (ref 6–20)
BUN: 25 mg/dL — ABNORMAL HIGH (ref 6–20)
BUN: 26 mg/dL — ABNORMAL HIGH (ref 6–20)
BUN: 24 mg/dL — ABNORMAL HIGH (ref 6–20)
BUN: 23 mg/dL — ABNORMAL HIGH (ref 6–20)
CO2: 14 mmol/L — ABNORMAL LOW (ref 22–32)
CO2: 19 mmol/L — ABNORMAL LOW (ref 22–32)
CO2: 21 mmol/L — ABNORMAL LOW (ref 22–32)
CO2: 20 mmol/L — ABNORMAL LOW (ref 22–32)
CO2: 20 mmol/L — ABNORMAL LOW (ref 22–32)
Calcium: 7.6 mg/dL — ABNORMAL LOW (ref 8.9–10.3)
Calcium: 8 mg/dL — ABNORMAL LOW (ref 8.9–10.3)
Calcium: 8 mg/dL — ABNORMAL LOW (ref 8.9–10.3)
Calcium: 7.4 mg/dL — ABNORMAL LOW (ref 8.9–10.3)
Calcium: 7.5 mg/dL — ABNORMAL LOW (ref 8.9–10.3)
Chloride: 89 mmol/L — ABNORMAL LOW (ref 98–111)
Chloride: 93 mmol/L — ABNORMAL LOW (ref 98–111)
Chloride: 95 mmol/L — ABNORMAL LOW (ref 98–111)
Chloride: 98 mmol/L (ref 98–111)
Chloride: 102 mmol/L (ref 98–111)
Creatinine, Ser: 1.51 mg/dL — ABNORMAL HIGH (ref 0.61–1.24)
Creatinine, Ser: 1.29 mg/dL — ABNORMAL HIGH (ref 0.61–1.24)
Creatinine, Ser: 1.48 mg/dL — ABNORMAL HIGH (ref 0.61–1.24)
Creatinine, Ser: 1.41 mg/dL — ABNORMAL HIGH (ref 0.61–1.24)
Creatinine, Ser: 1.33 mg/dL — ABNORMAL HIGH (ref 0.61–1.24)
GFR, Estimated: 55 mL/min — ABNORMAL LOW (ref >60)
GFR, Estimated: >60 mL/min (ref >60)
GFR, Estimated: 56 mL/min — ABNORMAL LOW (ref >60)
GFR, Estimated: 59 mL/min — ABNORMAL LOW (ref >60)
GFR, Estimated: >60 mL/min (ref >60)
Glucose, Bld: 539 mg/dL (ref 70–99)
Glucose, Bld: 255 mg/dL — ABNORMAL HIGH (ref 70–99)
Glucose, Bld: 264 mg/dL — ABNORMAL HIGH (ref 70–99)
Glucose, Bld: 287 mg/dL — ABNORMAL HIGH (ref 70–99)
Glucose, Bld: 198 mg/dL — ABNORMAL HIGH (ref 70–99)
Potassium: 3.6 mmol/L (ref 3.5–5.1)
Potassium: 4.2 mmol/L (ref 3.5–5.1)
Potassium: 3.6 mmol/L (ref 3.5–5.1)
Potassium: 2.9 mmol/L — ABNORMAL LOW (ref 3.5–5.1)
Potassium: 3.3 mmol/L — ABNORMAL LOW (ref 3.5–5.1)
Sodium: 120 mmol/L — ABNORMAL LOW (ref 135–145)
Sodium: 126 mmol/L — ABNORMAL LOW (ref 135–145)
Sodium: 128 mmol/L — ABNORMAL LOW (ref 135–145)
Sodium: 127 mmol/L — ABNORMAL LOW (ref 135–145)
Sodium: 131 mmol/L — ABNORMAL LOW (ref 135–145)

## 2020-04-22 LAB — I-STAT VENOUS BLOOD GAS, ED
Acid-base deficit: 7 mmol/L — ABNORMAL HIGH (ref 0.0–2.0)
Bicarbonate: 18 mmol/L — ABNORMAL LOW (ref 20.0–28.0)
Calcium, Ion: 1.09 mmol/L — ABNORMAL LOW (ref 1.15–1.40)
HCT: 46 % (ref 39.0–52.0)
Hemoglobin: 15.6 g/dL (ref 13.0–17.0)
O2 Saturation: 50 %
Patient temperature: 104.1
Potassium: 4 mmol/L (ref 3.5–5.1)
Sodium: 122 mmol/L — ABNORMAL LOW (ref 135–145)
TCO2: 19 mmol/L — ABNORMAL LOW (ref 22–32)
pCO2, Ven: 38 mmHg — ABNORMAL LOW (ref 44.0–60.0)
pH, Ven: 7.298 (ref 7.250–7.430)
pO2, Ven: 35 mmHg (ref 32.0–45.0)

## 2020-04-22 LAB — URINALYSIS, ROUTINE W REFLEX MICROSCOPIC
Bilirubin Urine: NEGATIVE
Glucose, UA: >=500 mg/dL — AB
Ketones, ur: 40 mg/dL — AB
Leukocytes,Ua: NEGATIVE
Nitrite: NEGATIVE
Protein, ur: NEGATIVE mg/dL
Specific Gravity, Urine: 1.01 (ref 1.005–1.030)
pH: 5.5 (ref 5.0–8.0)

## 2020-04-22 LAB — BLOOD CULTURE ID PANEL (REFLEXED) - BCID2

## 2020-04-22 LAB — RAPID URINE DRUG SCREEN, HOSP PERFORMED
Amphetamines: NOT DETECTED
Barbiturates: NOT DETECTED
Benzodiazepines: NOT DETECTED
Cocaine: NOT DETECTED
Opiates: POSITIVE — AB
Tetrahydrocannabinol: NOT DETECTED

## 2020-04-22 LAB — URINALYSIS, MICROSCOPIC (REFLEX): Squamous Epithelial / HPF: NONE SEEN (ref 0–5)

## 2020-04-22 LAB — LACTIC ACID, PLASMA
Lactic Acid, Venous: 3.3 mmol/L (ref 0.5–1.9)
Lactic Acid, Venous: 2.5 mmol/L (ref 0.5–1.9)
Lactic Acid, Venous: 3 mmol/L (ref 0.5–1.9)
Lactic Acid, Venous: 3.2 mmol/L (ref 0.5–1.9)
Lactic Acid, Venous: 1.7 mmol/L (ref 0.5–1.9)
Lactic Acid, Venous: 1.4 mmol/L (ref 0.5–1.9)

## 2020-04-22 LAB — CBG MONITORING, ED
Glucose-Capillary: 524 mg/dL (ref 70–99)
Glucose-Capillary: 543 mg/dL (ref 70–99)
Glucose-Capillary: 464 mg/dL — ABNORMAL HIGH (ref 70–99)
Glucose-Capillary: 505 mg/dL (ref 70–99)
Glucose-Capillary: 428 mg/dL — ABNORMAL HIGH (ref 70–99)

## 2020-04-22 LAB — AMMONIA: Ammonia: 23 umol/L (ref 9–35)

## 2020-04-22 LAB — C-REACTIVE PROTEIN: CRP: 34.3 mg/dL — ABNORMAL HIGH (ref <1.0)

## 2020-04-22 LAB — HEMOGLOBIN A1C
Hgb A1c MFr Bld: 11.3 % — ABNORMAL HIGH (ref 4.8–5.6)
Mean Plasma Glucose: 277.61 mg/dL

## 2020-04-22 LAB — CK: Total CK: 70 U/L (ref 49–397)

## 2020-04-22 LAB — COMPREHENSIVE METABOLIC PANEL
ALT: 48 U/L — ABNORMAL HIGH (ref 0–44)
AST: 40 U/L (ref 15–41)
Albumin: 2.4 g/dL — ABNORMAL LOW (ref 3.5–5.0)
Alkaline Phosphatase: 133 U/L — ABNORMAL HIGH (ref 38–126)
Anion gap: 21 — ABNORMAL HIGH (ref 5–15)
BUN: 30 mg/dL — ABNORMAL HIGH (ref 6–20)
CO2: 15 mmol/L — ABNORMAL LOW (ref 22–32)
Calcium: 7.8 mg/dL — ABNORMAL LOW (ref 8.9–10.3)
Chloride: 85 mmol/L — ABNORMAL LOW (ref 98–111)
Creatinine, Ser: 1.59 mg/dL — ABNORMAL HIGH (ref 0.61–1.24)
GFR, Estimated: 51 mL/min — ABNORMAL LOW (ref >60)
Glucose, Bld: 572 mg/dL (ref 70–99)
Potassium: 4.1 mmol/L (ref 3.5–5.1)
Sodium: 121 mmol/L — ABNORMAL LOW (ref 135–145)
Total Bilirubin: 2 mg/dL — ABNORMAL HIGH (ref 0.3–1.2)
Total Protein: 6.4 g/dL — ABNORMAL LOW (ref 6.5–8.1)

## 2020-04-22 LAB — LIPASE, BLOOD: Lipase: 35 U/L (ref 11–51)

## 2020-04-22 LAB — RESP PANEL BY RT-PCR (FLU A&B, COVID) ARPGX2
Influenza A by PCR: NEGATIVE
Influenza B by PCR: NEGATIVE
SARS Coronavirus 2 by RT PCR: NEGATIVE

## 2020-04-22 LAB — SEDIMENTATION RATE: Sed Rate: 60 mm/hr — ABNORMAL HIGH (ref 0–16)

## 2020-04-22 LAB — ETHANOL: Alcohol, Ethyl (B): <10 mg/dL (ref <10)

## 2020-04-22 LAB — SALICYLATE LEVEL: Salicylate Lvl: <7 mg/dL — ABNORMAL LOW (ref 7.0–30.0)

## 2020-04-22 LAB — APTT: aPTT: 24 seconds (ref 24–36)

## 2020-04-22 LAB — MRSA PCR SCREENING: MRSA by PCR: NEGATIVE

## 2020-04-22 LAB — PROTIME-INR
INR: 1.3 — ABNORMAL HIGH (ref 0.8–1.2)
Prothrombin Time: 15.7 seconds — ABNORMAL HIGH (ref 11.4–15.2)

## 2020-04-22 LAB — BETA-HYDROXYBUTYRIC ACID
Beta-Hydroxybutyric Acid: 1.5 mmol/L — ABNORMAL HIGH (ref 0.05–0.27)
Beta-Hydroxybutyric Acid: 0.48 mmol/L — ABNORMAL HIGH (ref 0.05–0.27)

## 2020-04-22 LAB — ECHOCARDIOGRAM LIMITED
Height: 75 in
Weight: 4472.69 oz

## 2020-04-22 LAB — HIV ANTIBODY (ROUTINE TESTING W REFLEX): HIV Screen 4th Generation wRfx: NONREACTIVE

## 2020-04-22 LAB — ACETAMINOPHEN LEVEL: Acetaminophen (Tylenol), Serum: 10 ug/mL (ref 10–30)

## 2020-04-22 IMAGING — DX DG CHEST 1V PORT
1 series · 1 of 1 positions shown · non-contrast
Comparison: None.

CLINICAL DATA: Sepsis

EXAM:
PORTABLE CHEST 1 VIEW

[chest ap]
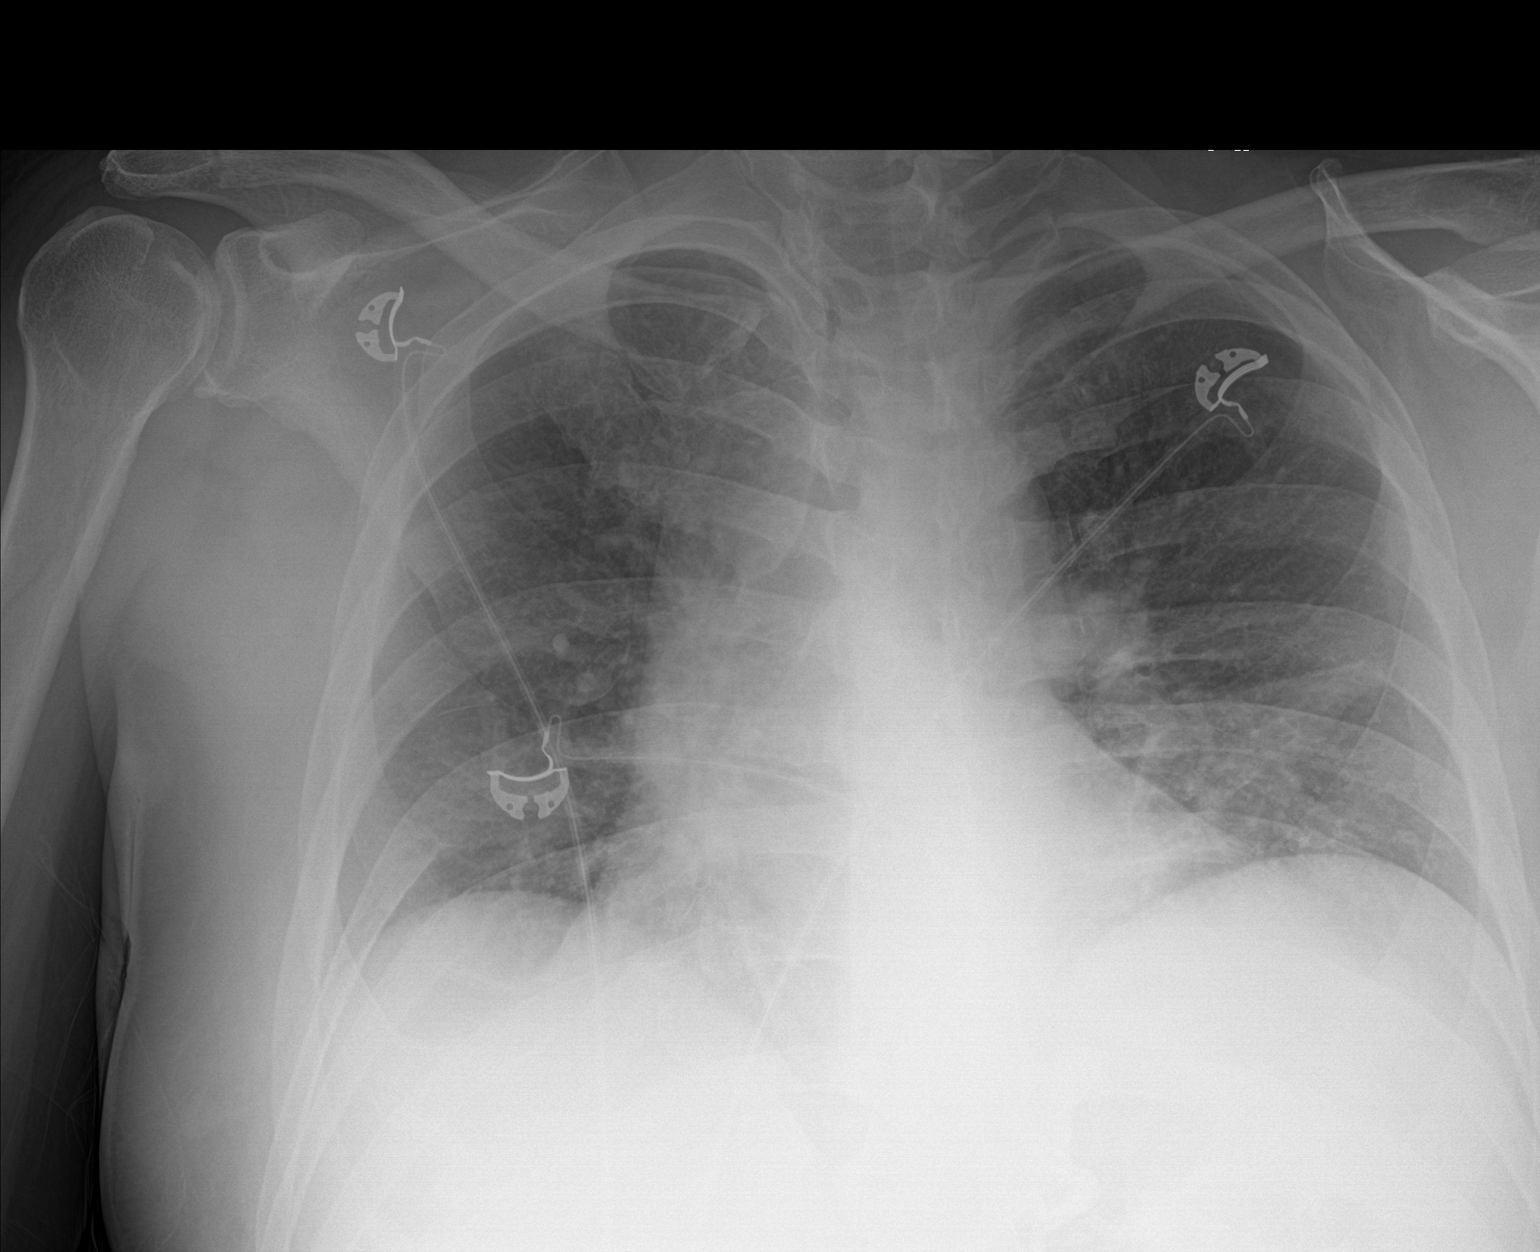

[1 of 1 positions shown; findings below may reference images not displayed]

FINDINGS: Lungs volumes are small, and there is mild left basilar atelectasis.
No pneumothorax or pleural effusion. Cardiac size within normal
limits. Pulmonary vascularity is normal. Osseous structures are
age-appropriate. No acute bone abnormality.
IMPRESSION: No active disease.

## 2020-04-22 IMAGING — CT CT ABD-PELV W/ CM
2 of 5 series · 16 of 46 positions shown, 18 images · IV contrast (omnipaque)
Comparison: None.

CLINICAL DATA: Abdominal pain, fever, vomiting

EXAM:
CT ABDOMEN AND PELVIS WITH CONTRAST
TECHNIQUE: Multidetector CT imaging of the abdomen and pelvis was performed
using the standard protocol following bolus administration of
intravenous contrast.
CONTRAST:  100mL OMNIPAQUE IOHEXOL 300 MG/ML  SOLN

[Series 3: axial st · axial · 0.95mm/px · z∈[-1220,-726]mm · 13 of 113 slices shown, 15 images]
[im 7/113  soft-tissue]
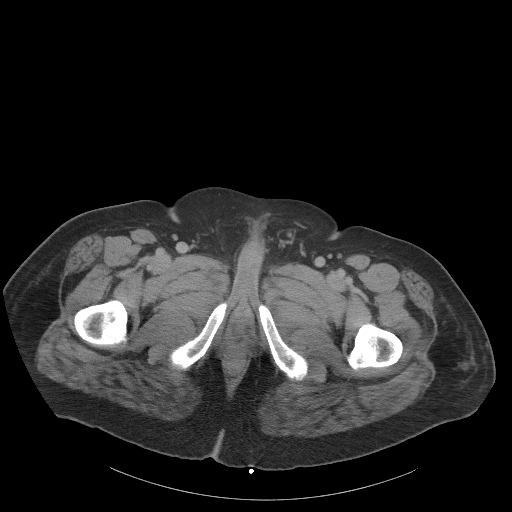
[im 7/113  bone]
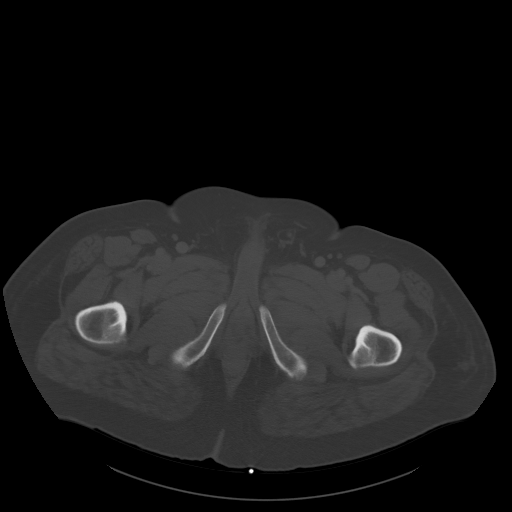
[im 13/113  soft-tissue]
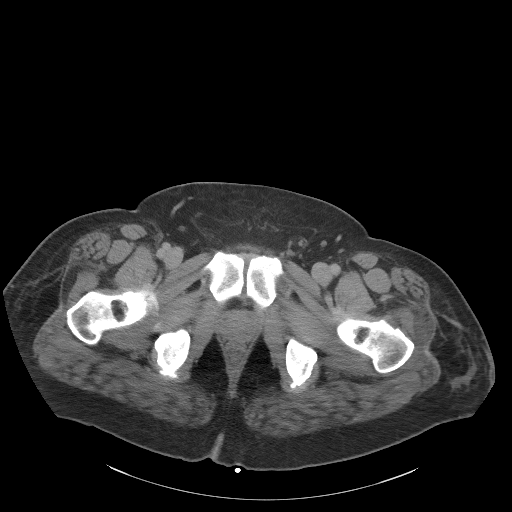
[im 25/113  soft-tissue]
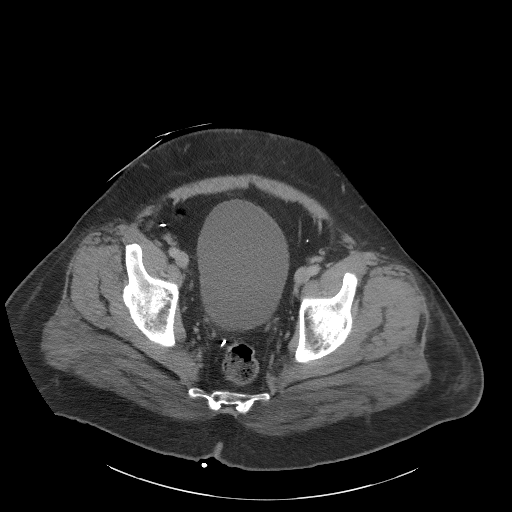
[im 32/113  soft-tissue]
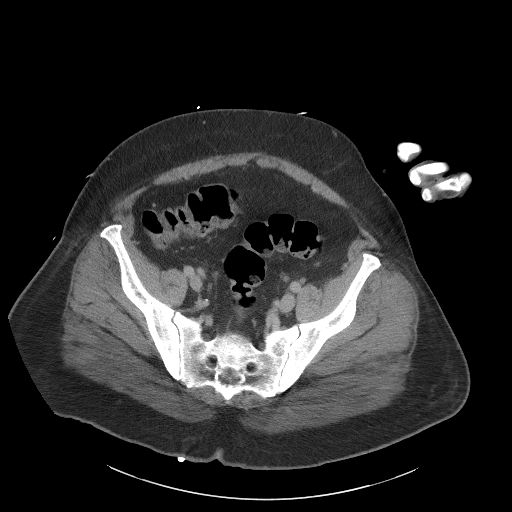
[im 38/113  soft-tissue]
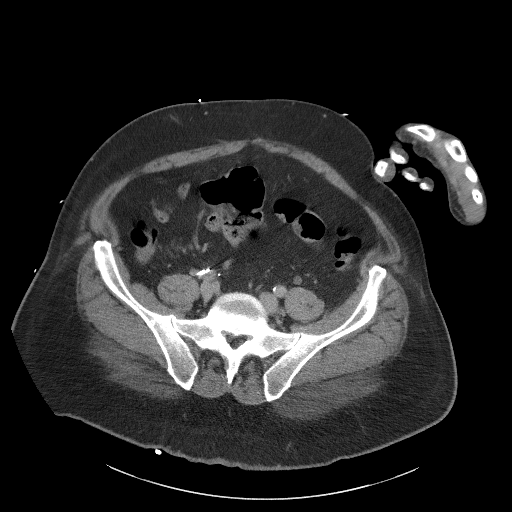
[im 50/113  soft-tissue]
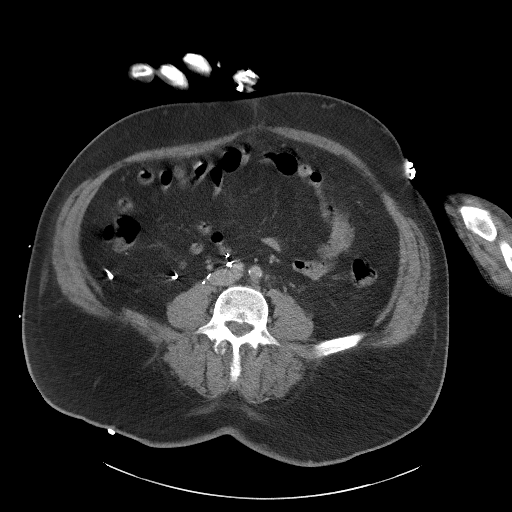
[im 57/113  soft-tissue]
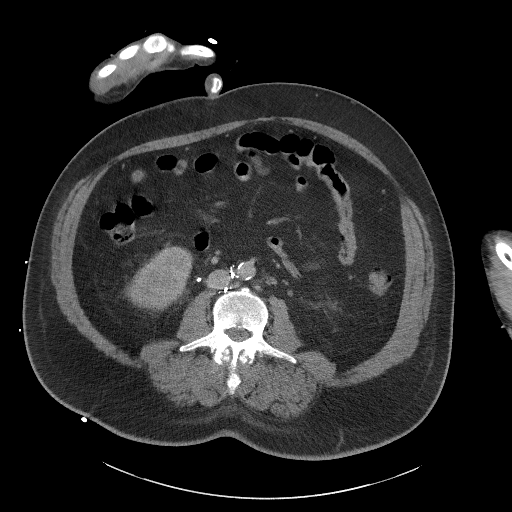
[im 63/113  soft-tissue]
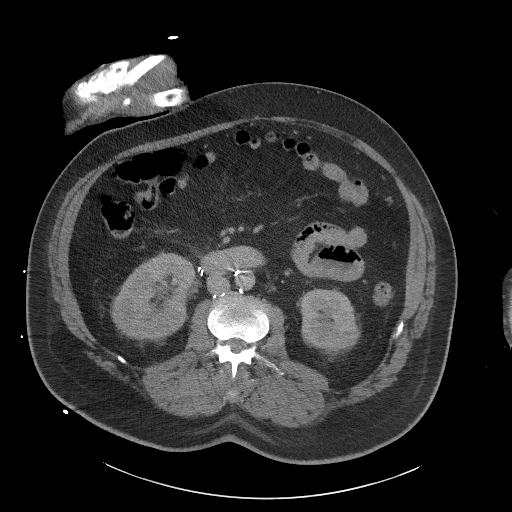
[im 75/113  soft-tissue]
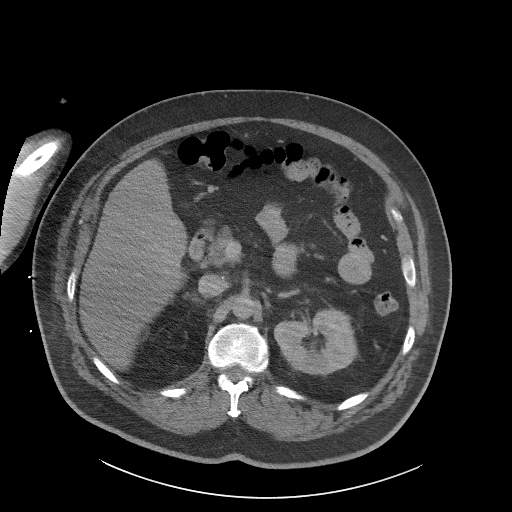
[im 75/113  bone]
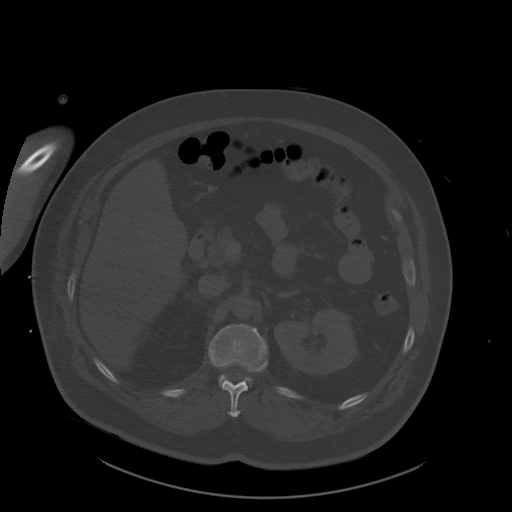
[im 81/113  soft-tissue]
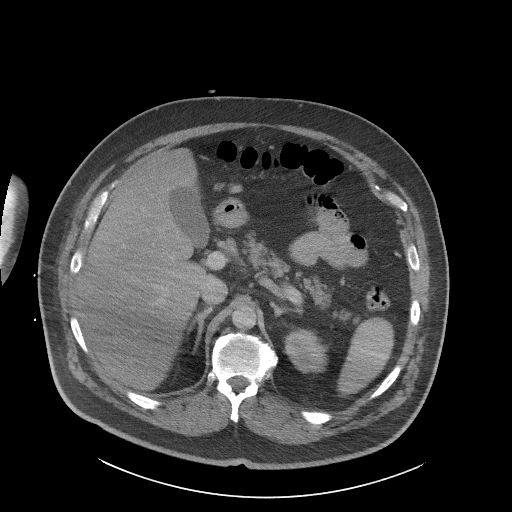
[im 88/113  soft-tissue]
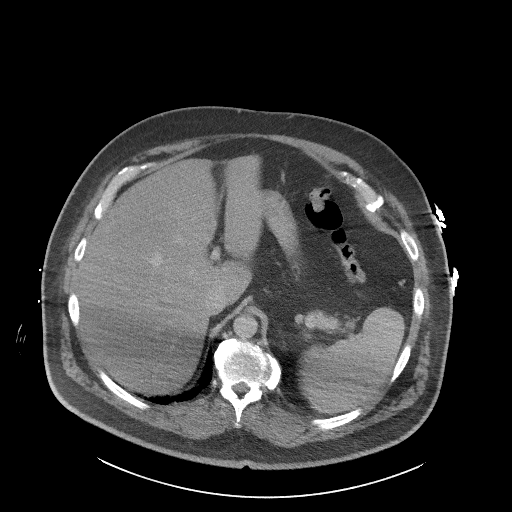
[im 100/113  soft-tissue]
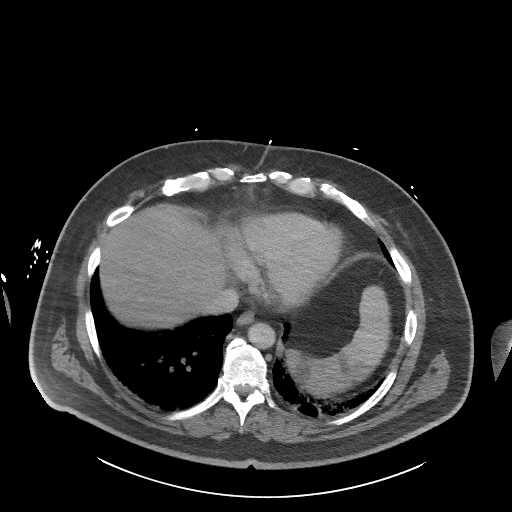
[im 106/113  soft-tissue]
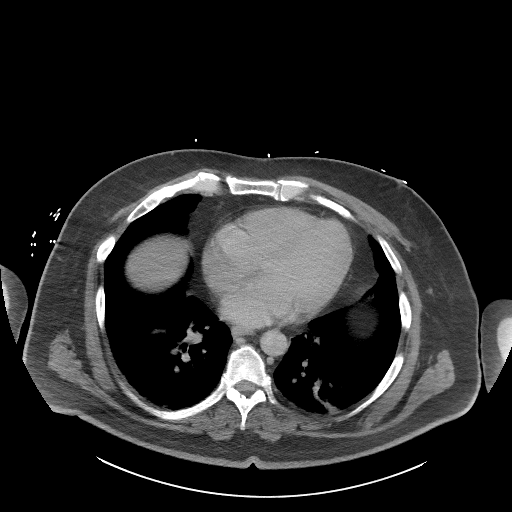

[Series 6: coronal st · coronal · 0.89mm/px · 3 of 116 slices shown]
[im 39/116  soft-tissue]
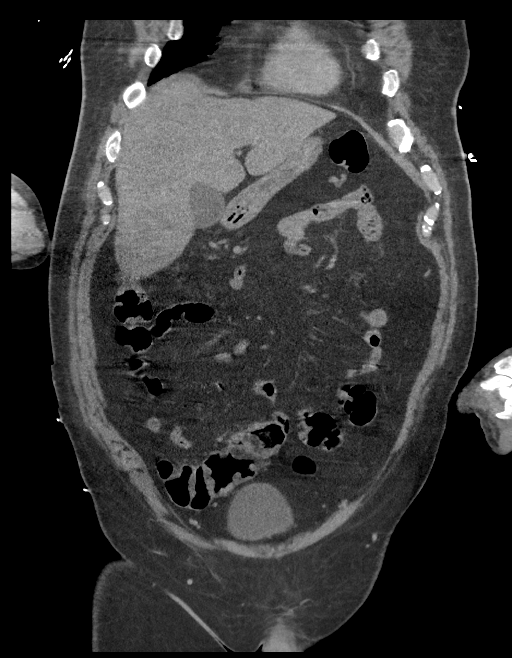
[im 52/116  soft-tissue]
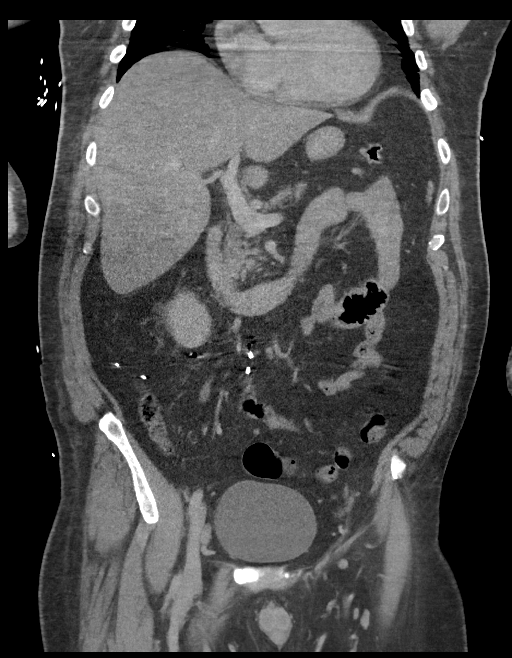
[im 64/116  soft-tissue]
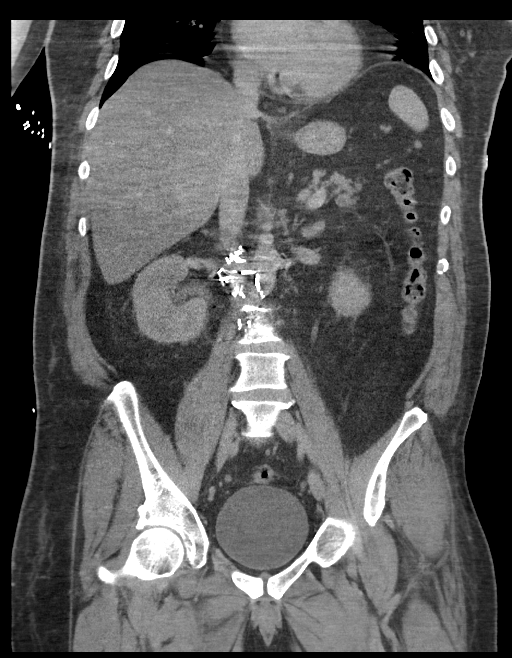

[16 of 46 positions shown; findings below may reference images not displayed]

FINDINGS: Lower chest: The visualized lung bases are clear bilaterally. The
visualized heart and pericardium are unremarkable.

Hepatobiliary: No focal liver abnormality is seen. No gallstones,
gallbladder wall thickening, or biliary dilatation.

Pancreas: Unremarkable

Spleen: There are multiple wedge-shaped areas of hypoattenuation
within the upper pole of the spleen likely representing multiple
small splenic infarcts. The spleen is of normal size. Splenic artery
and vein are patent.

Adrenals/Urinary Tract: Adrenal glands are unremarkable. Kidneys are
normal, without renal calculi, focal lesion, or hydronephrosis.
Bladder is unremarkable.

Stomach/Bowel: The stomach, small bowel, and large bowel are
unremarkable. The appendix is normal. No free intraperitoneal gas or
fluid.

Vascular/Lymphatic: The abdominal vasculature is unremarkable.
Numerous surgical clips are seen within the retroperitoneum in
keeping with retroperitoneal lymph node dissection. The right
spermatic cord is absent in keeping with orchiectomy. No pathologic
adenopathy within the abdomen and pelvis.

Reproductive: Prostate is unremarkable.

Other: Rectum unremarkable.  Tiny fat containing umbilical hernia.

Musculoskeletal: No acute bone abnormality.
IMPRESSION: Multiple small infarcts within the upper pole of the spleen.

Surgical changes of right orchiectomy and retroperitoneal lymph node
dissection in keeping with treatment of testicular cancer.

## 2020-04-22 IMAGING — CT CT HEAD W/O CM
3 series · 16 of 47 positions shown, 19 images · non-contrast
Comparison: None.

CLINICAL DATA: Delirium

EXAM:
CT HEAD WITHOUT CONTRAST
TECHNIQUE: Contiguous axial images were obtained from the base of the skull
through the vertex without intravenous contrast.

[Series 2: head wo · axial · 0.46mm/px · z∈[-419,-279]mm · 10 of 34 slices shown, 13 images]
[im 3/34  brain]
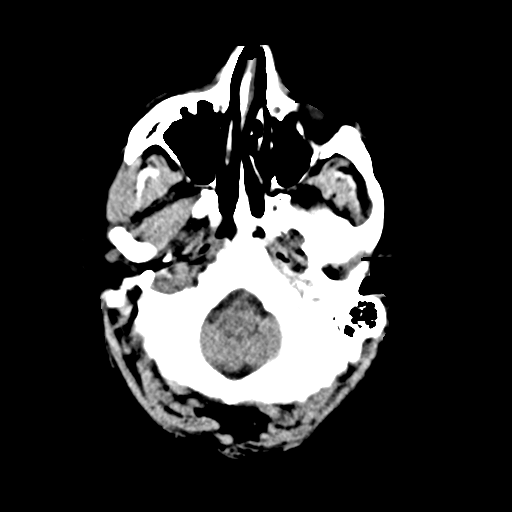
[im 3/34  bone]
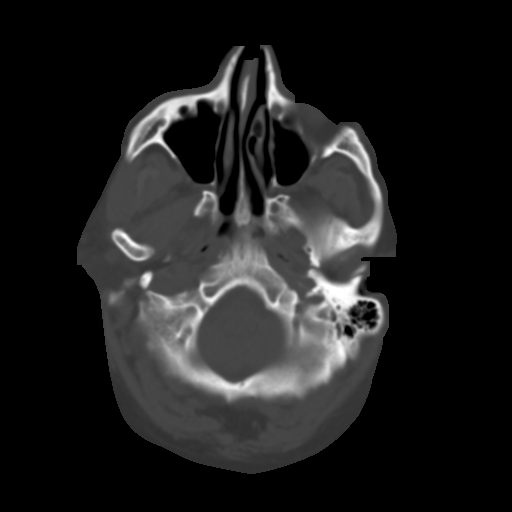
[im 6/34  brain]
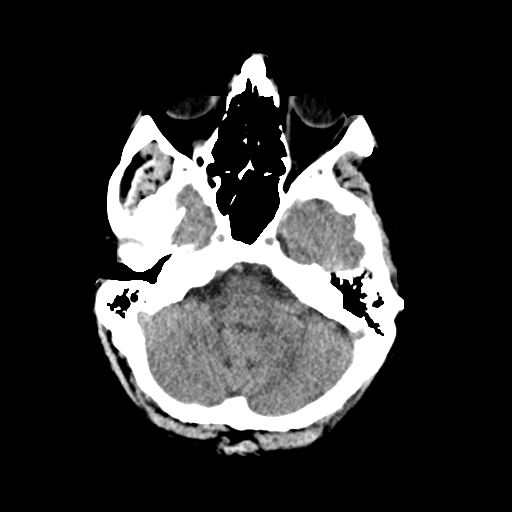
[im 10/34  brain]
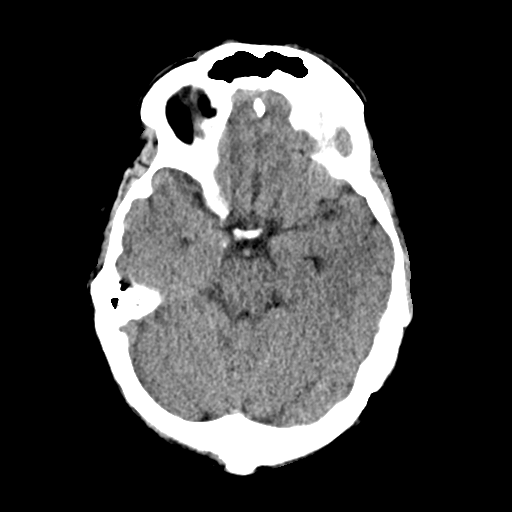
[im 12/34  brain]
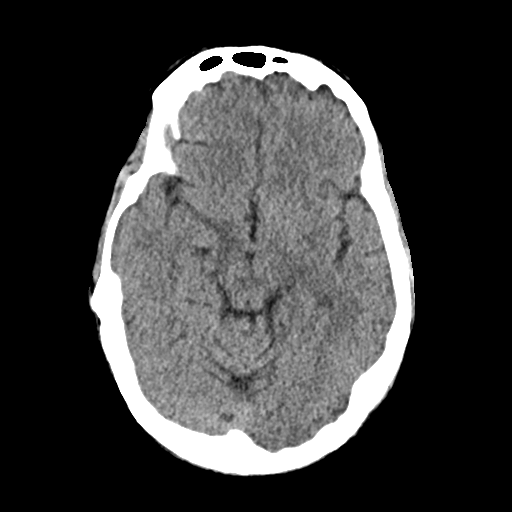
[im 15/34  brain]
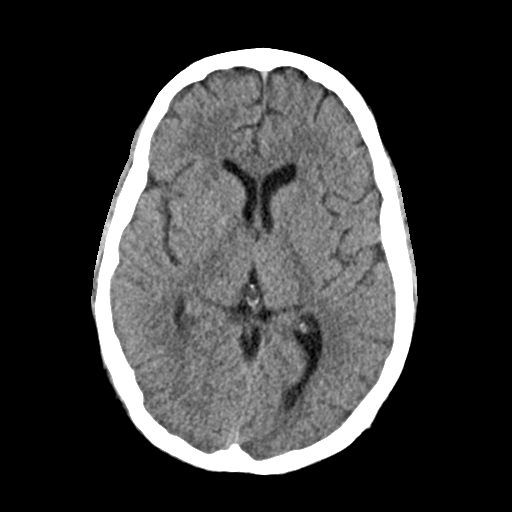
[im 15/34  bone]
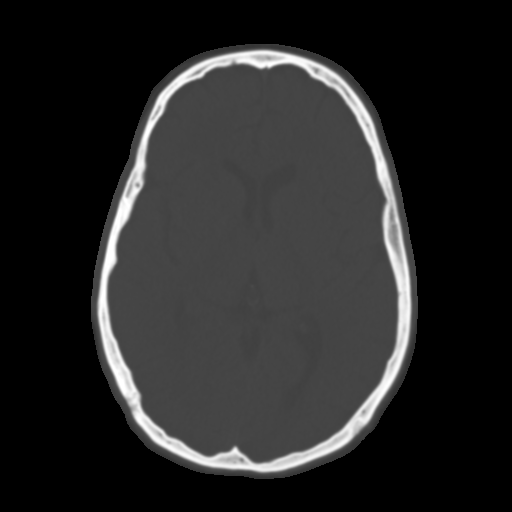
[im 19/34  brain]
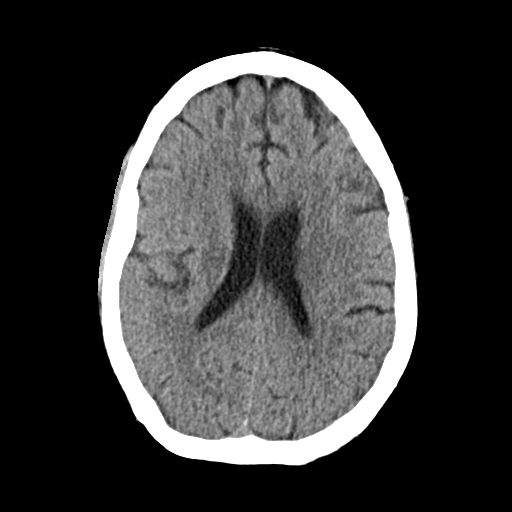
[im 22/34  brain]
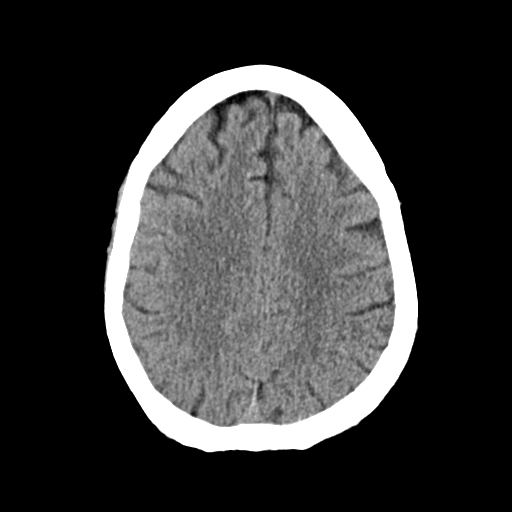
[im 26/34  brain]
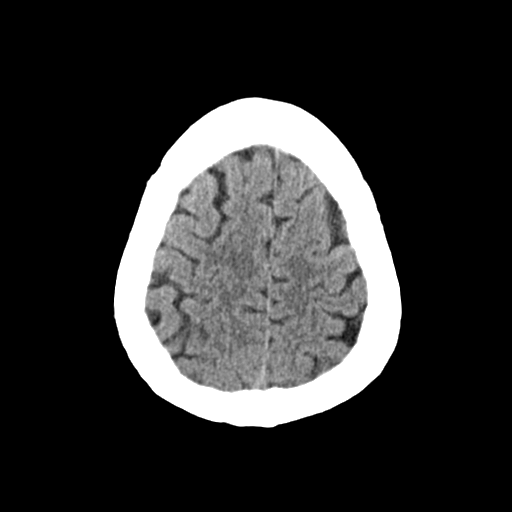
[im 28/34  brain]
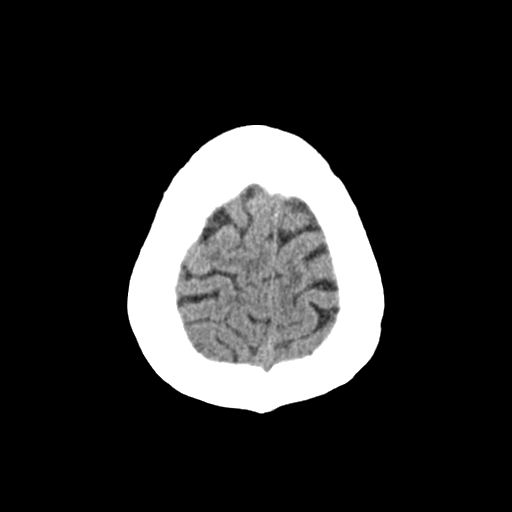
[im 28/34  bone]
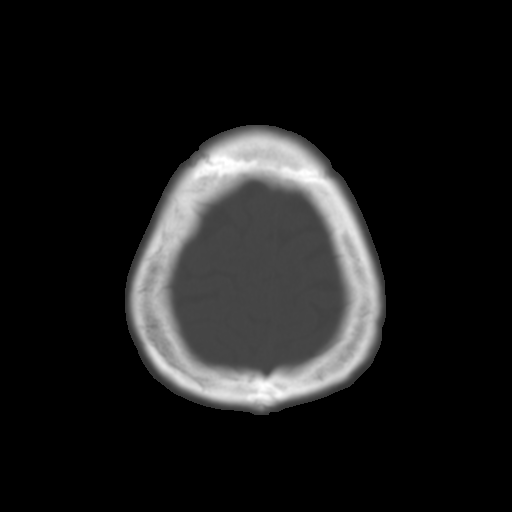
[im 31/34  brain]
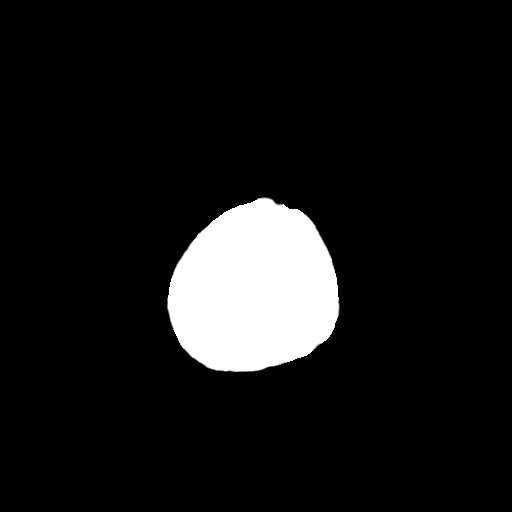

[Series 4: coronal soft · coronal · 0.33mm/px · 3 of 64 slices shown]
[im 22/64  brain]
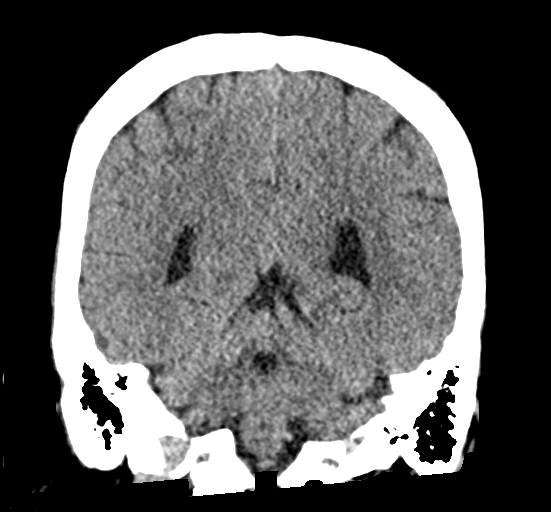
[im 29/64  brain]
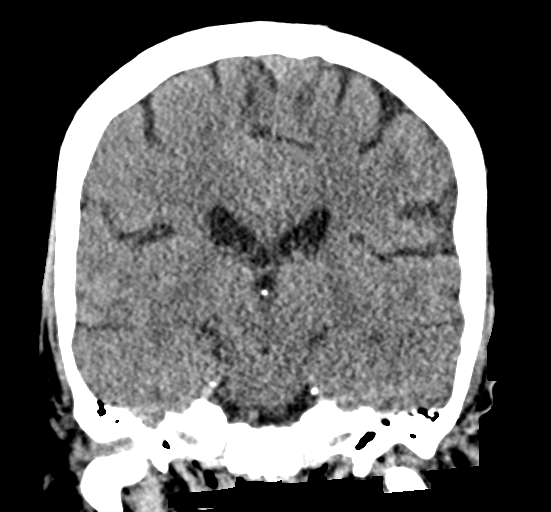
[im 36/64  brain]
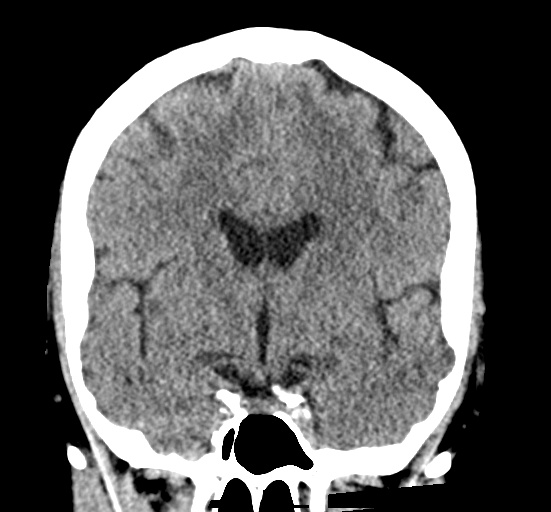

[Series 5: sag soft · sagittal · 0.33mm/px · 3 of 52 slices shown]
[im 18/52  brain]
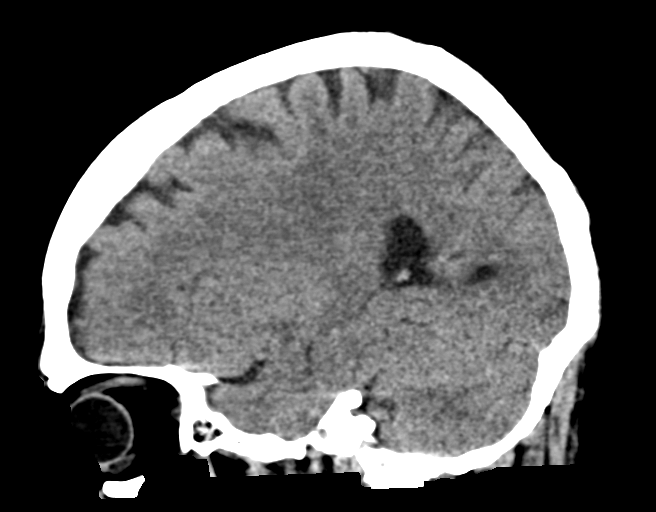
[im 26/52  brain]
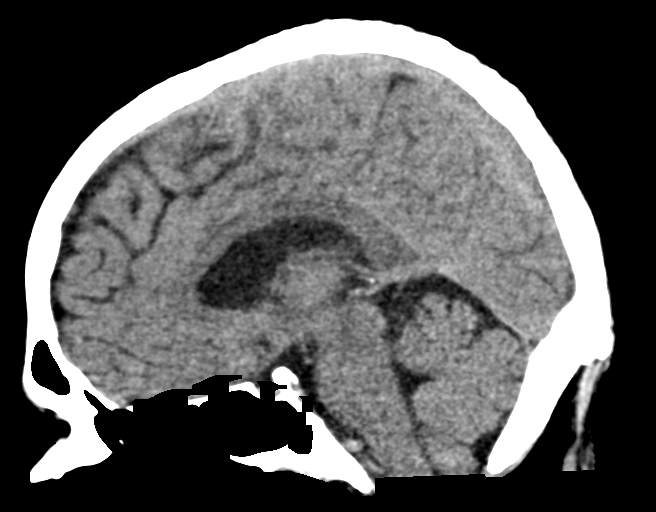
[im 35/52  brain]
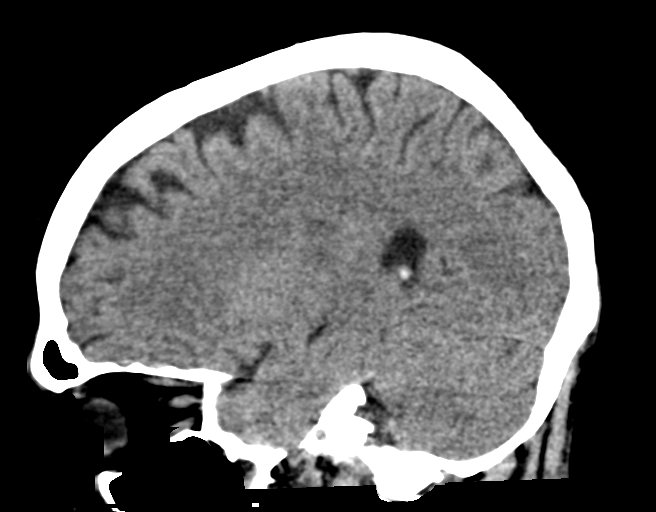

[16 of 47 positions shown; findings below may reference images not displayed]

FINDINGS: Brain: Normal anatomic configuration. No abnormal intra or
extra-axial mass lesion or fluid collection. No abnormal mass effect
or midline shift. No evidence of acute intracranial hemorrhage or
infarct. Ventricular size is normal. Cerebellum unremarkable.

Vascular: Unremarkable

Skull: Intact

Sinuses/Orbits: Paranasal sinuses are clear. Orbits are
unremarkable.

Other: Mastoid air cells and middle ear cavities are clear.
IMPRESSION: No acute intracranial abnormality.

## 2020-04-22 MED ORDER — ACETAMINOPHEN 325 MG PO TABS
650.0000 mg | ORAL_TABLET | Freq: Four times a day (QID) | ORAL | Status: DC | PRN
  Administered 2020-04-23 – 2020-04-29 (×5): 650 mg via ORAL
  Filled 2020-04-22 (×5): qty 2

## 2020-04-22 MED ORDER — DEXTROSE IN LACTATED RINGERS 5 % IV SOLN: INTRAVENOUS | Status: DC

## 2020-04-22 MED ORDER — SODIUM CHLORIDE 0.9 % IV BOLUS
1000.0000 mL | Freq: Once | INTRAVENOUS | Status: AC
  Administered 2020-04-22: 1000 mL via INTRAVENOUS

## 2020-04-22 MED ORDER — LACTATED RINGERS IV SOLN: INTRAVENOUS | Status: DC

## 2020-04-22 MED ORDER — INSULIN REGULAR(HUMAN) IN NACL 100-0.9 UT/100ML-% IV SOLN
INTRAVENOUS | Status: DC
  Administered 2020-04-22: 7 [IU]/h via INTRAVENOUS
  Administered 2020-04-22: 3.6 [IU]/h via INTRAVENOUS
  Administered 2020-04-23: 8.5 [IU]/h via INTRAVENOUS
  Filled 2020-04-22 (×2): qty 100

## 2020-04-22 MED ORDER — LACTATED RINGERS IV BOLUS (SEPSIS)
1000.0000 mL | Freq: Once | INTRAVENOUS | Status: AC
  Administered 2020-04-22: 1000 mL via INTRAVENOUS

## 2020-04-22 MED ORDER — ACETAMINOPHEN 650 MG RE SUPP: 650.0000 mg | Freq: Four times a day (QID) | RECTAL | Status: DC | PRN

## 2020-04-22 MED ORDER — CHLORHEXIDINE GLUCONATE CLOTH 2 % EX PADS
6.0000 | MEDICATED_PAD | Freq: Every day | CUTANEOUS | Status: DC
  Administered 2020-04-22 – 2020-04-27 (×6): 6 via TOPICAL

## 2020-04-22 MED ORDER — POTASSIUM CHLORIDE 10 MEQ/50ML IV SOLN
10.0000 meq | INTRAVENOUS | Status: AC
  Administered 2020-04-22 (×4): 10 meq via INTRAVENOUS
  Filled 2020-04-22 (×4): qty 50

## 2020-04-22 MED ORDER — SODIUM CHLORIDE 0.9 % IV SOLN
INTRAVENOUS | Status: AC
  Filled 2020-04-22: qty 2

## 2020-04-22 MED ORDER — ENOXAPARIN SODIUM 40 MG/0.4ML ~~LOC~~ SOLN
40.0000 mg | SUBCUTANEOUS | Status: DC
  Administered 2020-04-22 – 2020-04-23 (×2): 40 mg via SUBCUTANEOUS
  Filled 2020-04-22 (×2): qty 0.4

## 2020-04-22 MED ORDER — SODIUM CHLORIDE 0.9 % IV SOLN
2.0000 g | Freq: Once | INTRAVENOUS | Status: AC
  Administered 2020-04-22: 2 g via INTRAVENOUS

## 2020-04-22 MED ORDER — SODIUM CHLORIDE 0.9 % IV BOLUS: 500.0000 mL | Freq: Once | INTRAVENOUS | Status: DC

## 2020-04-22 MED ORDER — CEFEPIME HCL 2 G IJ SOLR
2.0000 g | Freq: Three times a day (TID) | INTRAMUSCULAR | Status: DC
Start: 2020-04-22 — End: 2020-04-23
  Administered 2020-04-22 – 2020-04-23 (×3): 2 g via INTRAVENOUS
  Filled 2020-04-22 (×3): qty 2

## 2020-04-22 MED ORDER — SODIUM CHLORIDE 0.9 % IV SOLN
100.0000 mg | Freq: Two times a day (BID) | INTRAVENOUS | Status: DC
  Administered 2020-04-22 – 2020-04-23 (×2): 100 mg via INTRAVENOUS
  Filled 2020-04-22 (×2): qty 100

## 2020-04-22 MED ORDER — VANCOMYCIN HCL IN DEXTROSE 1-5 GM/200ML-% IV SOLN
1000.0000 mg | Freq: Two times a day (BID) | INTRAVENOUS | Status: DC
  Administered 2020-04-22 – 2020-04-23 (×2): 1000 mg via INTRAVENOUS
  Filled 2020-04-22 (×2): qty 200

## 2020-04-22 MED ORDER — LIP MEDEX EX OINT
TOPICAL_OINTMENT | CUTANEOUS | Status: DC | PRN
  Filled 2020-04-22: qty 7

## 2020-04-22 MED ORDER — DEXTROSE 50 % IV SOLN: 0.0000 mL | INTRAVENOUS | Status: DC | PRN

## 2020-04-22 MED ORDER — ACETAMINOPHEN 325 MG PO TABS
650.0000 mg | ORAL_TABLET | Freq: Once | ORAL | Status: AC
  Administered 2020-04-22: 650 mg via ORAL
  Filled 2020-04-22: qty 2

## 2020-04-22 MED ORDER — INSULIN REGULAR(HUMAN) IN NACL 100-0.9 UT/100ML-% IV SOLN
INTRAVENOUS | Status: AC
  Administered 2020-04-22: 7.5 [IU]/h via INTRAVENOUS
  Filled 2020-04-22: qty 100

## 2020-04-22 MED ORDER — INSULIN REGULAR(HUMAN) IN NACL 100-0.9 UT/100ML-% IV SOLN: INTRAVENOUS | Status: DC

## 2020-04-22 MED ORDER — VANCOMYCIN HCL 750 MG/150ML IV SOLN
750.0000 mg | Freq: Two times a day (BID) | INTRAVENOUS | Status: DC
  Filled 2020-04-22: qty 150

## 2020-04-22 MED ORDER — ONDANSETRON HCL 4 MG/2ML IJ SOLN: 4.0000 mg | Freq: Four times a day (QID) | INTRAMUSCULAR | Status: DC | PRN

## 2020-04-22 MED ORDER — PIPERACILLIN-TAZOBACTAM 3.375 G IVPB
3.3750 g | Freq: Once | INTRAVENOUS | Status: AC
  Administered 2020-04-22: 3.375 g via INTRAVENOUS
  Filled 2020-04-22: qty 50

## 2020-04-22 MED ORDER — IOHEXOL 300 MG/ML  SOLN
100.0000 mL | Freq: Once | INTRAMUSCULAR | Status: AC | PRN
  Administered 2020-04-22: 100 mL via INTRAVENOUS

## 2020-04-22 MED ORDER — SODIUM CHLORIDE 0.9 % IV BOLUS
1000.0000 mL | Freq: Once | INTRAVENOUS | Status: AC
Start: 2020-04-22 — End: 2020-04-22
  Administered 2020-04-22: 1000 mL via INTRAVENOUS

## 2020-04-22 MED ORDER — POTASSIUM CHLORIDE 10 MEQ/100ML IV SOLN
10.0000 meq | INTRAVENOUS | Status: AC
  Administered 2020-04-22 (×2): 10 meq via INTRAVENOUS
  Filled 2020-04-22 (×2): qty 100

## 2020-04-22 MED ORDER — VANCOMYCIN HCL IN DEXTROSE 1-5 GM/200ML-% IV SOLN
1000.0000 mg | INTRAVENOUS | Status: AC
  Administered 2020-04-22 (×2): 1000 mg via INTRAVENOUS
  Filled 2020-04-22: qty 200

## 2020-04-22 MED ORDER — SODIUM CHLORIDE 0.9% FLUSH
10.0000 mL | Freq: Two times a day (BID) | INTRAVENOUS | Status: DC
  Administered 2020-04-22 – 2020-04-26 (×7): 10 mL

## 2020-04-22 MED ORDER — ORAL CARE MOUTH RINSE
15.0000 mL | Freq: Two times a day (BID) | OROMUCOSAL | Status: DC
  Administered 2020-04-22 – 2020-04-29 (×9): 15 mL via OROMUCOSAL

## 2020-04-22 MED ORDER — VANCOMYCIN HCL IN DEXTROSE 1-5 GM/200ML-% IV SOLN
1000.0000 mg | Freq: Once | INTRAVENOUS | Status: DC
  Filled 2020-04-22: qty 200

## 2020-04-22 MED ORDER — ONDANSETRON HCL 4 MG PO TABS: 4.0000 mg | ORAL_TABLET | Freq: Four times a day (QID) | ORAL | Status: DC | PRN

## 2020-04-22 MED ORDER — SODIUM CHLORIDE 0.9% FLUSH: 10.0000 mL | INTRAVENOUS | Status: DC | PRN

## 2020-04-22 MED ORDER — FENTANYL CITRATE (PF) 100 MCG/2ML IJ SOLN
50.0000 ug | INTRAMUSCULAR | Status: DC | PRN
  Administered 2020-04-23 – 2020-04-28 (×13): 50 ug via INTRAVENOUS
  Filled 2020-04-22 (×13): qty 2

## 2020-04-22 MED ORDER — CHLORHEXIDINE GLUCONATE 0.12 % MT SOLN
15.0000 mL | Freq: Two times a day (BID) | OROMUCOSAL | Status: DC
  Administered 2020-04-22 – 2020-04-29 (×15): 15 mL via OROMUCOSAL
  Filled 2020-04-22 (×15): qty 15

## 2020-04-22 NOTE — H&P (Addendum)
History and Physical  Patient Name: Jonathan Shaffer     MOQ:[id number]    DOB: 1966/03/10    DOA: 04/05/2020 PCP: Patient, No Pcp Per  Patient coming from: Home  Chief Complaint: Fever, confusion, weakness      HPI: Jonathan Shaffer is a 54 y.o. M with hx untreated DM, chronic urticaria who presents with 1 week fever, joint pain, and rash.  Patient was completely normal state of health until 1 week ago, he ate a shrimp cocktail and within minutes felt chills and violently ill, vomited all night.  He continued to have waxing and waning malaise, chills, and fever.    A few days later developed severe pain in the right shoulder, unable to move the joint.  Seen in the ER, diagnosed with sprain, followed up with PCP and got corticosteroid injection 3 days prior to this admission.  This helped immensely at first, then pain migrated to hips/thighs, and PCP started prednisone taper by phone (only took 1 dose).  Malaise, chills, fever, and fatigue progressed and new non-blnaching painful purple nodules appeared on the right 1 MTP joint and the right hand, and yesterday the symptoms were severe so the patient came to the ER.  In the ER, temp 104, heart rate 130, respirations 40, lactate 3.3, blood pressure normal.  Chest x-ray clear.  CT head unremarkable.  CT abdomen and pelvis with contrast showed no intraabdominal infection, but did show multiple splenic infarcts.  Also noted to have elevated AG, glucose >500, ketonuria.  Started on fluids, insulin infusion for DKA, also broad spectrum antibiotics.     Wife notes 20 lbs weight loss over last 6-12 months. No polyuria or polydipsia per patient.  History of testicular cancer.  No history of recent travel (since before COVID).  Works as Education officer, community.  No IVDU, no illicit drug use at all, no new sexual contacts.           ROS: Review of Systems  Constitutional: Positive for chills, fever, malaise/fatigue and weight loss.  HENT: Negative  for congestion, sinus pain and sore throat.   Eyes: Negative.   Respiratory: Negative for cough, sputum production and shortness of breath.   Cardiovascular: Negative for chest pain, palpitations, orthopnea, claudication, leg swelling and PND.  Gastrointestinal: Negative for abdominal pain and vomiting.  Genitourinary: Negative for dysuria, flank pain, frequency, hematuria and urgency.  Musculoskeletal: Positive for joint pain. Negative for back pain, myalgias and neck pain.  Skin: Positive for rash. Negative for itching.  Neurological: Positive for weakness. Negative for dizziness, focal weakness, seizures, loss of consciousness and headaches.  All other systems reviewed and are negative.         Past Medical History:  Diagnosis Date  . Cancer ([place])   . Diabetes mellitus without complication ([place])   . High cholesterol   . Testicle cancer ([place])   . Thyroid disease   . Urticaria    Recurrent idiopathic urticaria in 2018    Past Surgical History:  Procedure Laterality Date  . ORCHIECTOMY      Social History: Patient lives with his wife, works as Programme researcher, broadcasting/film/video.  Lives also sometimes alone in apartment in [place].  The patient walks unassisted.  Nonsmoker.  No illicit drugs, no significant alcohol.  No Known Allergies  Family history: family history includes Diabetes in his father; Heart disease in his father; Hyperlipidemia in his father; Hypertension in his father.  Prior to Admission medications   Medication Sig Start Date End Date  Taking? Authorizing Provider  HYDROcodone-acetaminophen (NORCO/VICODIN) 5-325 MG tablet Take 1 tablet by mouth every 6 (six) hours as needed for moderate pain.   Yes [provider]  lidocaine (LIDODERM) 5 % Place 1 patch onto the skin daily. Remove & Discard patch within 12 hours or as directed by MD 04/17/20  Yes Tacy Learn, PA-C  meloxicam (MOBIC) 7.5 MG tablet Take 7.5 mg by mouth daily.   Yes [provider]  predniSONE  (DELTASONE) 10 MG tablet Take as directed for 12 days.  Daily dose 6,6,5,5,4,4,3,3,2,2,1,1. Patient taking differently: Take 10-60 mg by mouth See admin instructions. Take as directed for 12 days.  Daily dose 6,6,5,5,4,4,3,3,2,2,1,1. 04/21/20  Yes Hilts, Legrand Como, MD  traMADol (ULTRAM) 50 MG tablet Take 1-2 tablets (50-100 mg total) by mouth every 6 (six) hours as needed for moderate pain. 04/19/20  Yes Hilts, Legrand Como, MD       Physical Exam: BP (!) 156/104   Pulse (!) 132   Temp 98 F (36.7 C) (Oral)   Resp (!) 28   Ht 6\' 3"  (1.905 m)   Wt 126.8 kg   SpO2 96%   BMI 34.94 kg/m  General appearance: Well-developed, adult male, stuporous, inattentive, confused, mild distress due to malaise.   Eyes: Anicteric, conjunctiva pink, lids and lashes normal. PERRL.    ENT: No nasal deformity, discharge, epistaxis.  Hearing normal. Dentition in good repair.  OP dry, lips dry, no oral lesions.   Neck: No neck masses.  Trachea midline.  No thyromegaly/tenderness. Lymph: No cervical or supraclavicular lymphadenopathy. Skin: Warm and dry.  There is a faint red papular rash on the face, forehead, cheeks, fading by the neck.  There is a purple macule (not nodule) on the right 2nd MCP joint, and a larger painful patch, no longer really a nodule on the 1st MTP.    Cardiac: Tachycardic, gallop noted.  Capillary refill is brisk.  JVP not visible.  No LE edema.  Radial and DP pulses 2+ and symmetric. Respiratory: Slightly tachypneic.  CTAB without rales or wheezes. Abdomen: Abdomen soft.  No TTP or guarding. No ascites, distension, hepatosplenomegaly.   MSK: No deformities or effusions of the large joints of the upper or lower extremities bilaterally.  No cyanosis or clubbing. The right shoulder is somewhat tender and with limited ROM, but no deformity that I see Neuro: Cranial nerves difficult to assses given confusion, inattentiveness.  Upper arm strenght is 4/5 but symmetric.  Speech is fluent but content is  confused.      Pupils equal and reactive. Psych: Sensorium intact and responding to questions, but attention diminished, affect blunted, judgment impaired.         Labs on Admission:  I have personally reviewed following labs and imaging studies: CBC: Recent Labs  Lab 04/23/2020 0202 05/03/2020 0204  WBC 21.3*  --   NEUTROABS 19.5*  --   HGB 14.0 15.6  HCT 41.6 46.0  MCV 89.1  --   PLT 191  --    Basic Metabolic Panel: Recent Labs  Lab 04/21/2020 0202 04/19/2020 0204 04/20/2020 0405 04/25/2020 1017  NA 121* 122* 120* 126*  K 4.1 4.0 3.6 4.2  CL 85*  --  89* 93*  CO2 15*  --  14* 19*  GLUCOSE 572*  --  539* 255*  BUN 30*  --  29* 25*  CREATININE 1.59*  --  1.51* 1.29*  CALCIUM 7.8*  --  7.6* 8.0*   GFR: Estimated Creatinine Clearance: 93.9  mL/min (A) (by C-G formula based on SCr of 1.29 mg/dL (H)).  Liver Function Tests: Recent Labs  Lab 04/21/2020 0202  AST 40  ALT 48*  ALKPHOS 133*  BILITOT 2.0*  PROT 6.4*  ALBUMIN 2.4*   Recent Labs  Lab 04/17/2020 0202  LIPASE 35   Recent Labs  Lab 04/12/2020 0202  AMMONIA 23   Coagulation Profile: Recent Labs  Lab 04/21/2020 0202  INR 1.3*   Cardiac Enzymes: Recent Labs  Lab 04/30/2020 0202  CKTOTAL 70   BNP (last 3 results) No results for input(s): PROBNP in the last 8760 hours. HbA1C: No results for input(s): HGBA1C in the last 72 hours. CBG: Recent Labs  Lab 04/09/2020 0634 04/20/2020 0734 04/29/2020 0837 04/27/2020 0927 04/24/2020 1032  GLUCAP 381* 322* 272* 227* 234*   Lipid Profile: No results for input(s): CHOL, HDL, LDLCALC, TRIG, CHOLHDL, LDLDIRECT in the last 72 hours. Thyroid Function Tests: No results for input(s): TSH, T4TOTAL, FREET4, T3FREE, THYROIDAB in the last 72 hours. Anemia Panel: No results for input(s): VITAMINB12, FOLATE, FERRITIN, TIBC, IRON, RETICCTPCT in the last 72 hours. Sepsis Labs: Lactate elevated  Recent Results (from the past 240 hour(s))  Resp Panel by RT-PCR (Flu A&B, Covid)  Nasopharyngeal Swab     Status: None   Collection Time: 04/04/2020  2:05 AM   Specimen: Nasopharyngeal Swab; Nasopharyngeal(NP) swabs in vial transport medium  Result Value Ref Range Status   SARS Coronavirus 2 by RT PCR NEGATIVE NEGATIVE Final    Comment: (NOTE) SARS-CoV-2 target nucleic acids are NOT DETECTED.  The SARS-CoV-2 RNA is generally detectable in upper respiratory specimens during the acute phase of infection. The lowest concentration of SARS-CoV-2 viral copies this assay can detect is 138 copies/mL. A negative result does not preclude SARS-Cov-2 infection and should not be used as the sole basis for treatment or other patient management decisions. A negative result may occur with  improper specimen collection/handling, submission of specimen other than nasopharyngeal swab, presence of viral mutation(s) within the areas targeted by this assay, and inadequate number of viral copies(<138 copies/mL). A negative result must be combined with clinical observations, patient history, and epidemiological information. The expected result is Negative.  Fact Sheet for Patients:  EntrepreneurPulse.com.au  Fact Sheet for Healthcare Providers:  IncredibleEmployment.be  This test is no t yet approved or cleared by the [place] FDA and  has been authorized for detection and/or diagnosis of SARS-CoV-2 by FDA under an Emergency Use Authorization (EUA). This EUA will remain  in effect (meaning this test can be used) for the duration of the COVID-19 declaration under Section 564(b)(1) of the Act, 21 U.S.C.section 360bbb-3(b)(1), unless the authorization is terminated  or revoked sooner.       Influenza A by PCR NEGATIVE NEGATIVE Final   Influenza B by PCR NEGATIVE NEGATIVE Final    Comment: (NOTE) The Xpert Xpress SARS-CoV-2/FLU/RSV plus assay is intended as an aid in the diagnosis of influenza from Nasopharyngeal swab specimens and should not be  used as a sole basis for treatment. Nasal washings and aspirates are unacceptable for Xpert Xpress SARS-CoV-2/FLU/RSV testing.  Fact Sheet for Patients: EntrepreneurPulse.com.au  Fact Sheet for Healthcare Providers: IncredibleEmployment.be  This test is not yet approved or cleared by the [place] FDA and has been authorized for detection and/or diagnosis of SARS-CoV-2 by FDA under an Emergency Use Authorization (EUA). This EUA will remain in effect (meaning this test can be used) for the duration of the COVID-19 declaration under Section 564(b)(1)  of the Act, 21 U.S.C. section 360bbb-3(b)(1), unless the authorization is terminated or revoked.  Performed at [facility], [address] [place], [place] [address]   MRSA PCR Screening     Status: None   Collection Time: 04/21/2020  6:24 AM   Specimen: Nasopharyngeal  Result Value Ref Range Status   MRSA by PCR NEGATIVE NEGATIVE Final    Comment:        The GeneXpert MRSA Assay (FDA approved for NASAL specimens only), is one component of a comprehensive MRSA colonization surveillance program. It is not intended to diagnose MRSA infection nor to guide or monitor treatment for MRSA infections. Performed at [facility], [place] [address], [place], [place] [address]            Radiological Exams on Admission: Personally reviewed CT head report shows nothing, CT abdomen and pelvis shows splenic infarcts, CXR personally reviewed, no opacities or pneumonia or effusions: CT Head Wo Contrast  Result Date: 04/08/2020 CLINICAL DATA:  Delirium EXAM: CT HEAD WITHOUT CONTRAST TECHNIQUE: Contiguous axial images were obtained from the base of the skull through the vertex without intravenous contrast. COMPARISON:  None. FINDINGS: Brain: Normal anatomic configuration. No abnormal intra or extra-axial mass lesion or fluid collection. No abnormal mass effect or midline shift.  No evidence of acute intracranial hemorrhage or infarct. Ventricular size is normal. Cerebellum unremarkable. Vascular: Unremarkable Skull: Intact Sinuses/Orbits: Paranasal sinuses are clear. Orbits are unremarkable. Other: Mastoid air cells and middle ear cavities are clear. IMPRESSION: No acute intracranial abnormality. Electronically Signed   By: Fidela Salisbury MD   On: 04/04/2020 04:18   CT ABDOMEN PELVIS W CONTRAST  Result Date: 04/11/2020 CLINICAL DATA:  Abdominal pain, fever, vomiting EXAM: CT ABDOMEN AND PELVIS WITH CONTRAST TECHNIQUE: Multidetector CT imaging of the abdomen and pelvis was performed using the standard protocol following bolus administration of intravenous contrast. CONTRAST:  173mL OMNIPAQUE IOHEXOL 300 MG/ML  SOLN COMPARISON:  None. FINDINGS: Lower chest: The visualized lung bases are clear bilaterally. The visualized heart and pericardium are unremarkable. Hepatobiliary: No focal liver abnormality is seen. No gallstones, gallbladder wall thickening, or biliary dilatation. Pancreas: Unremarkable Spleen: There are multiple wedge-shaped areas of hypoattenuation within the upper pole of the spleen likely representing multiple small splenic infarcts. The spleen is of normal size. Splenic artery and vein are patent. Adrenals/Urinary Tract: Adrenal glands are unremarkable. Kidneys are normal, without renal calculi, focal lesion, or hydronephrosis. Bladder is unremarkable. Stomach/Bowel: The stomach, small bowel, and large bowel are unremarkable. The appendix is normal. No free intraperitoneal gas or fluid. Vascular/Lymphatic: The abdominal vasculature is unremarkable. Numerous surgical clips are seen within the retroperitoneum in keeping with retroperitoneal lymph node dissection. The right spermatic cord is absent in keeping with orchiectomy. No pathologic adenopathy within the abdomen and pelvis. Reproductive: Prostate is unremarkable. Other: Rectum unremarkable.  Tiny fat containing  umbilical hernia. Musculoskeletal: No acute bone abnormality. 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 Electronically Signed   By: Fidela Salisbury MD   On: 04/15/2020 04:27   DG Chest Port 1 View  Result Date: 04/15/2020 CLINICAL DATA:  Sepsis EXAM: PORTABLE CHEST 1 VIEW COMPARISON:  None. FINDINGS: Lungs volumes are small, and there is mild left basilar atelectasis. No pneumothorax or pleural effusion. Cardiac size within normal limits. Pulmonary vascularity is normal. Osseous structures are age-appropriate. No acute  bone abnormality. IMPRESSION: No active disease. Electronically Signed   By: Fidela Salisbury MD   On: 04/11/2020 02:55    EKG: Independently reviewed. ECG shows sinus tachycardia, no ST changes.       Assessment/Plan   Suspected sepsis, unclear source The systemic illness and embolic phenomena (splenic infarcts and hand foot skin lesions) suggest endocarditis.  UA shows hematuria and pyuria, suggesting some degree of nephritis from emboli?  Urine culture pending given pyuria, although no urinary symptoms.  History does suggest association with shellfish.  Discussed with ID, encephalopathy explained by sepsis plus DKA, meningitis unlikely.  -Continue vancomycin and cefepime -Add doxycycline for Vibrio spp. Given shrimp cocktail history  -Follow blood cultures -Follow urine culture  -Obtain Echocardiogram -Consult to ID, appreciate cares    Acute metabolic encephalopathy This persists, not improved. Consistent with the degree of DKA and sepsis.  If septic emboli are present, management would be same. -Low threshold to obtain MRI brain to eval for septic emboli    Diabetic ketoacidosis Untreated diabetes PCP notes in 2019 suggest he was diagnosed with diabetes, but his wife is unaware of this, and he is  clearly not on treatment. -Check hemoglobin A1c  -Continue insulin drip for treatment of DKA  -Continue IVF -Supplement electrolytes -q4 BMP until gap closes -Consult diabetes educator   Acute kidney injury due to sepsis and diabetic ketoacidosis Probably this is hemodynamically mediated due to DKA and sepsis.  The pyuria and hematuria should prompt consideration of glomerulonephritis if Cr does not improve. Baseline Cr 0.9 in CareEverywhere, here is 1.6 on admission -Continue IVF -Trend BMP  Transaminitis Probably sepsis related inury -Trend LFTs  Splenic infarct  If these are due to endocarditis, no further hematologic work-up is necessary.  If cultures are negative, though, we would benefit from Heme input -Consult Hematology, appreciate cares      DVT prophylaxis: Lovenox  Code Status: FULL  Family Communication: Wife at bedside  Disposition Plan: Anticipate IV fluids, insulin, empiric antibiotics.  Follow culture data, tailor to culture sources. Consults called: ID, hematology Admission status: Inpatient   At the time of admission, it appears that the appropriate admission status for this patient is INPATIENT. This is judged to be reasonable and necessary in order to provide the required intensity of service to ensure the patient's safety given: -presenting symptoms of confusion, malaise, joint pain -physical exam findings of fever, tachycardia, tachpnea, confusion, and  -initial radiographic and laboratory data elevated lactic acid, acute kidney injury, diabetic ketoacidosis -in the context of their chronic comorbidities untreated diabets    Together, these circumstances are felt to place him at high risk for further clinical deterioration threatening life, limb, or organ requiring a high intensity of service due to this acute illness that poses a threat to life, limb or bodily function.  I certify that at the point of admission it is my clinical judgment that the  patient will require inpatient hospital care spanning beyond 2 midnights from the point of admission and that early discharge would result in unnecessary risk of decompensation and readmission or threat to life, limb or bodily function.  Medical decision making: Patient seen at 8:00AM on 05/03/2020.  The patient was discussed with Dr. Linus Salmons.  What exists of the patient's chart was reviewed in depth and summarized above.  Clinical condition: mentation remains poor, HR still elevated, BP normal.        Suann Larry Joshua Soulier [facility] Please page though [facility] or Epic secure chat:  For password, contact charge nurse

## 2020-04-22 NOTE — Consult Note (Signed)
[facility] Disease       Reason for Consult: splenic infarct/emboli    Referring Physician: Dr. Loleta Books  Principal Problem:   Severe sepsis [facility]) Active Problems:   Splenic infarct   Diabetic acidosis without coma ([place])   AKI (acute kidney injury) ([place])   . chlorhexidine  15 mL Mouth Rinse BID  . Chlorhexidine Gluconate Cloth  6 each Topical Daily  . enoxaparin (LOVENOX) injection  40 mg Subcutaneous Q24H  . 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  I doubt this is a result of clotting with no arrhythmia and above findings.  Vibrio vulnificus possible but no underlying liver disease and he initially recovered from the GI issue.    Antibiotics: Vancomycin and cefepime Doxycycline added  HPI: Jonathan Shaffer is a 54 y.o. male with a history of poorly controlled DM came in with malaise, fever, chills.  He has been having some right shoulder pain and received a steroid injection but continued to feel poorly all over.  His wife is at the bedside and gives the history, along with chart review.  He is febrile with a Tmax of 104 and WBC of 21.3.  He recently had a episode of n/v after eating shrimp but had recovered.  No known sick contacts or travel.      Review of Systems:  Unable to be assessed due to mental status  Past Medical History:  Diagnosis Date  . Cancer ([place])   . Diabetes mellitus without complication ([place])   . High cholesterol   . 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 Thyroid disease   . Urticaria    Recurrent idiopathic urticaria in 2018    Social History   Tobacco Use  . Smoking status: Never Smoker  . Smokeless tobacco: Never Used  Substance Use Topics  . Alcohol use: Yes  . Drug use: Never    Family  History  Problem Relation Age of Onset  . Diabetes Father   . Hypertension Father   . Heart disease Father   . Hyperlipidemia Father     No Known Allergies  Physical Exam: Constitutional: disoriented  Vitals:   04/11/2020 1100 04/29/2020 1200  BP: (!) 163/90 (!) 163/86  Pulse: (!) 136 (!) 130  Resp: (!) 36 (!) 24  Temp:  100 F (37.8 C)  SpO2: 96% 94%   EYES: anicteric ENMT:no thrush Cardiovascular: Cor Tachy Respiratory: clear; GI: Bowel sounds are normal, liver is not enlarged, spleen is not enlarged Musculoskeletal: no pedal edema noted; left foot with 1st MTP with swelling, ecchymosis; left hand with a nodule on his finger; right shoulder with warmth  Skin: negatives: no rash Neuro: no response, awake, moving all extremitis  Lab Results  Component Value Date   WBC 21.3 (H) 04/30/2020   HGB 15.6 04/18/2020   HCT 46.0 04/19/2020   MCV 89.1 04/05/2020   PLT 191 04/09/2020    Lab Results  Component Value Date   CREATININE 1.29 (H) 05/02/2020   BUN 25 (H) 04/24/2020   NA 126 (L) 04/06/2020   K 4.2 05/02/2020   CL 93 (L) 04/04/2020   CO2 19 (L) 04/21/2020    Lab Results  Component Value Date   ALT 48 (H) 04/15/2020  AST 40 04/07/2020   ALKPHOS 133 (H) 04/23/2020     Microbiology: Recent Results (from the past 240 hour(s))  Resp Panel by RT-PCR (Flu A&B, Covid) Nasopharyngeal Swab     Status: None   Collection Time: 04/10/2020  2:05 AM   Specimen: Nasopharyngeal Swab; Nasopharyngeal(NP) swabs in vial transport medium  Result Value Ref Range Status   SARS Coronavirus 2 by RT PCR NEGATIVE NEGATIVE Final    Comment: (NOTE) SARS-CoV-2 target nucleic acids are NOT DETECTED.  The SARS-CoV-2 RNA is generally detectable in upper respiratory specimens during the acute phase of infection. The lowest concentration of SARS-CoV-2 viral copies this assay can detect is 138 copies/mL. A negative result does not preclude SARS-Cov-2 infection and should not be used as the  sole basis for treatment or other patient management decisions. A negative result may occur with  improper specimen collection/handling, submission of specimen other than nasopharyngeal swab, presence of viral mutation(s) within the areas targeted by this assay, and inadequate number of viral copies(<138 copies/mL). A negative result must be combined with clinical observations, patient history, and epidemiological information. The expected result is Negative.  Fact Sheet for Patients:  EntrepreneurPulse.com.au  Fact Sheet for Healthcare Providers:  IncredibleEmployment.be  This test is no t yet approved or cleared by the [place] FDA and  has been authorized for detection and/or diagnosis of SARS-CoV-2 by FDA under an Emergency Use Authorization (EUA). This EUA will remain  in effect (meaning this test can be used) for the duration of the COVID-19 declaration under Section 564(b)(1) of the Act, 21 U.S.C.section 360bbb-3(b)(1), unless the authorization is terminated  or revoked sooner.       Influenza A by PCR NEGATIVE NEGATIVE Final   Influenza B by PCR NEGATIVE NEGATIVE Final    Comment: (NOTE) The Xpert Xpress SARS-CoV-2/FLU/RSV plus assay is intended as an aid in the diagnosis of influenza from Nasopharyngeal swab specimens and should not be used as a sole basis for treatment. Nasal washings and aspirates are unacceptable for Xpert Xpress SARS-CoV-2/FLU/RSV testing.  Fact Sheet for Patients: EntrepreneurPulse.com.au  Fact Sheet for Healthcare Providers: IncredibleEmployment.be  This test is not yet approved or cleared by the [place] FDA and has been authorized for detection and/or diagnosis of SARS-CoV-2 by FDA under an Emergency Use Authorization (EUA). This EUA will remain in effect (meaning this test can be used) for the duration of the COVID-19 declaration under Section 564(b)(1) of the  Act, 21 U.S.C. section 360bbb-3(b)(1), unless the authorization is terminated or revoked.  Performed at [facility], [address] [place], [place] [address]   MRSA PCR Screening     Status: None   Collection Time: 04/30/2020  6:24 AM   Specimen: Nasopharyngeal  Result Value Ref Range Status   MRSA by PCR NEGATIVE NEGATIVE Final    Comment:        The GeneXpert MRSA Assay (FDA approved for NASAL specimens only), is one component of a comprehensive MRSA colonization surveillance program. It is not intended to diagnose MRSA infection nor to guide or monitor treatment for MRSA infections. Performed at [facility], [place] [address], [place], [place] [address]     Laurelin Elson W [place] Monds, MD [facility] [email or web address] 04/18/2020, 1:29 PM

## 2020-04-22 NOTE — Sepsis Progress Note (Signed)
Patient arrived to [facility] ICU. All sepsis bundle criteria met. Will continue to follow as ICU patient.

## 2020-04-22 NOTE — Progress Notes (Signed)
PHARMACY - PHYSICIAN COMMUNICATION CRITICAL VALUE ALERT - BLOOD CULTURE IDENTIFICATION (BCID)  Jonathan Shaffer is an 54 y.o. male who presented to [facility] on 04/10/2020 with a chief complaint of Fever, confusion, weakness   Assessment:  Severe sepsis   Name of physician (or Provider) Contacted: Lovey Newcomer NP   Current antibiotics: vancomycin, cefepime, doxycycline  Changes to prescribed antibiotics recommended:  Keep all antibiotics for now   Results for orders placed or performed during the hospital encounter of 04/28/2020  Blood Culture ID Panel (Reflexed) (Collected: 04/30/2020  2:00 AM)  Result Value Ref Range   Enterococcus faecalis NOT DETECTED NOT DETECTED   Enterococcus Faecium NOT DETECTED NOT DETECTED   Listeria monocytogenes NOT DETECTED NOT DETECTED   Staphylococcus species DETECTED (A) NOT DETECTED   Staphylococcus aureus (BCID) DETECTED (A) NOT DETECTED   Staphylococcus epidermidis NOT DETECTED NOT DETECTED   Staphylococcus lugdunensis NOT DETECTED NOT DETECTED   Streptococcus species NOT DETECTED NOT DETECTED   Streptococcus agalactiae NOT DETECTED NOT DETECTED   Streptococcus pneumoniae NOT DETECTED NOT DETECTED   Streptococcus pyogenes NOT DETECTED NOT DETECTED   A.calcoaceticus-baumannii NOT DETECTED NOT DETECTED   Bacteroides fragilis NOT DETECTED NOT DETECTED   Enterobacterales NOT DETECTED NOT DETECTED   Enterobacter cloacae complex NOT DETECTED NOT DETECTED   Escherichia coli NOT DETECTED NOT DETECTED   Klebsiella aerogenes NOT DETECTED NOT DETECTED   Klebsiella oxytoca NOT DETECTED NOT DETECTED   Klebsiella pneumoniae NOT DETECTED NOT DETECTED   Proteus species NOT DETECTED NOT DETECTED   Salmonella species NOT DETECTED NOT DETECTED   Serratia marcescens NOT DETECTED NOT DETECTED   Haemophilus influenzae NOT DETECTED NOT DETECTED   Neisseria meningitidis NOT DETECTED NOT DETECTED   Pseudomonas aeruginosa NOT DETECTED NOT DETECTED   Stenotrophomonas  maltophilia NOT DETECTED NOT DETECTED   Candida albicans NOT DETECTED NOT DETECTED   Candida auris NOT DETECTED NOT DETECTED   Candida glabrata NOT DETECTED NOT DETECTED   Candida krusei NOT DETECTED NOT DETECTED   Candida parapsilosis NOT DETECTED NOT DETECTED   Candida tropicalis NOT DETECTED NOT DETECTED   Cryptococcus neoformans/gattii NOT DETECTED NOT DETECTED   Meth resistant mecA/C and MREJ NOT DETECTED NOT DETECTED    Royetta Asal, PharmD, BCPS 04/06/2020 9:24 PM

## 2020-04-22 NOTE — Sepsis Progress Note (Signed)
Following for Code Sepsis monitoring. Delay in blood cultures d/t hemolyzation. Appropriate fluid resuscitation and antibiotics ordered.

## 2020-04-22 NOTE — ED Notes (Signed)
Patient transported to CT 

## 2020-04-22 NOTE — ED Notes (Signed)
Date and time results received: 04/07/2020 0232  Test: lactic acid Critical Value: 3.3  Name of Provider Notified: Dr. Christy Gentles  Orders Received? Or Actions Taken?: no new orders

## 2020-04-22 NOTE — Progress Notes (Signed)
CRITICAL VALUE ALERT  Critical Value:  3.3  Date & Time Notied: 11/19 11:01  Provider Notified: Yes, Dr. Loleta Books  Orders Received/Actions taken:

## 2020-04-22 NOTE — ED Notes (Signed)
Date and time results received: 04/25/2020 0447  Test: glucose Critical Value: 539  Name of Provider Notified: Dr. Christy Gentles  Orders Received? Or Actions Taken?: No new orders

## 2020-04-22 NOTE — ED Triage Notes (Signed)
Pt with c/o right hip pain and bilateral knee pain. Pt recently had gi symptoms (vomiting) last week and seen for shoulder pain over the weekend. Denies current n/v/d or fevers. Had vicodin for pain last night.

## 2020-04-22 NOTE — ED Provider Notes (Signed)
[facility] EMERGENCY DEPARTMENT Provider Note   CSN: [id number] Arrival date & time: 04/24/2020  0117     History Chief Complaint - weakness  Level 5 caveat due to altered mental status  Jonathan Shaffer is a 54 y.o. male.  The history is provided by the patient and the spouse. The history is limited by the condition of the patient.  Weakness Severity:  Severe Onset quality:  Gradual Timing:  Constant Progression:  Worsening Chronicity:  New Relieved by:  Nothing Worsened by:  Activity Associated symptoms: arthralgias, nausea, vision change and vomiting   Associated symptoms: no cough and no diarrhea   Patient presents with chills confusion and shoulder pain The wife presents most of the story Patient was in his apartment in [place] around November 10.  He developed what he thought was a gastrointestinal illness from eating shrimp cocktail.  He had episodes of vomiting  and during that time he thought he may have strained his right shoulder.  Patient came back home that weekend and wife reports that he had fevers and chills but had normal mental status but had shoulder pain.  He was seen in the ER on November 14 for right shoulder pain and had an x-ray that revealed arthritis but no other acute findings He was placed on medications (Vicodin/Lidoderm/Robaxin/Mobic) and told to follow-up. Patient followed up with his PCP Dr. Junius Roads on November 16.  He had an ultrasound-guided Intra-articular injection that dramatically improved his pain. However his pain returned, and his PCP had him start prednisone. Patient was already describing continued pain in the right shoulder and is knees prior to starting the prednisone. Wife reports that patient has started to have  generalized weakness.  He has required significant assistance to even get up which is unusual for him.  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 Tonight he was  given a Vicodin and had increasing confusion  No recent travel.  No known tick bites No recent trauma He is fully vaccinated for COVID-19     Past Medical History:  Diagnosis Date  . Cancer ([place])     There are no problems to display for this patient.   History reviewed. No pertinent surgical history.     No family history on file.  Social History   Tobacco Use  . Smoking status: Never Smoker  . Smokeless tobacco: Never Used  Substance Use Topics  . Alcohol use: Yes  . Drug use: Never    Home Medications Prior to Admission medications   Medication Sig Start Date End Date Taking? Authorizing Provider  lidocaine (LIDODERM) 5 % Place 1 patch onto the skin daily. Remove & Discard patch within 12 hours or as directed by MD 04/17/20  Yes Tacy Learn, PA-C  predniSONE (DELTASONE) 10 MG tablet Take as directed for 12 days.  Daily dose 6,6,5,5,4,4,3,3,2,2,1,1. 04/21/20  Yes Hilts, Legrand Como, MD  traMADol (ULTRAM) 50 MG tablet Take 1-2 tablets (50-100 mg total) by mouth every 6 (six) hours as needed for moderate pain. 04/19/20  Yes Hilts, Legrand Como, MD    Allergies    Patient has no known allergies.  Review of Systems   Review of Systems  Unable to perform ROS: Mental status change  Constitutional: Positive for chills and fatigue.  Respiratory: Negative for cough.   Gastrointestinal: Positive for nausea and vomiting. Negative for diarrhea.  Endocrine: Positive for polydipsia.  Musculoskeletal: Positive for arthralgias and joint swelling.  Neurological: Positive for weakness.  Psychiatric/Behavioral: Positive for confusion.    Physical Exam Updated Vital Signs BP (!) 166/94 (BP Location: Right Arm)   Pulse (!) 133   Temp (!) 104 F (40 C) (Rectal)   Resp (!) 40   Ht 1.905 m (6\' 3" )   Wt 117 kg   SpO2 100%   BMI 32.24 kg/m   Physical Exam CONSTITUTIONAL: Well developed but ill-appearing HEAD: Normocephalic/atraumatic, no signs of trauma EYES:  EOMI/PERRL ENMT: Mucous membranes dry NECK: supple no meningeal signs SPINE/BACK:entire spine nontender CV: S1/S2 noted, tachycardic up to 133 LUNGS: Tachypneic, brief coughing noted.  Decreased breath sounds bilaterally,?  Crackles left base ABDOMEN: soft, nontender, no rebound or guarding, bowel sounds noted throughout abdomen GU:no cva tenderness NEURO: Pt is awake but is confused and mumbles incoherently during part of the exam.  Patient will follow commands, moves all extremities x4.  No facial droop EXTREMITIES: pulses normal/equal, full ROM, mild erythema noted over the right anterior shoulder.  No crepitus or bruising.  He has no tenderness to palpation and he is able to range the right shoulder with minimal difficulty. No joint effusions noted in the rest of his extremities.  No tenderness noted with range of motion of all 4 extremities SKIN: warm, color normal,, mild erythema to right shoulder that wife reports has been there for several days, no other rash noted.  No petechiae noted PSYCH: Unable to assess  ED Results / Procedures / Treatments   Labs (all labs ordered are listed, but only abnormal results are displayed) Labs Reviewed  LACTIC ACID, PLASMA - Abnormal; Notable for the following components:      Result Value   Lactic Acid, Venous 3.3 (*)    All other components within normal limits  LACTIC ACID, PLASMA - Abnormal; Notable for the following components:   Lactic Acid, Venous 2.5 (*)    All other components within normal limits  COMPREHENSIVE METABOLIC PANEL - Abnormal; Notable for the following components:   Sodium 121 (*)    Chloride 85 (*)    CO2 15 (*)    Glucose, Bld 572 (*)    BUN 30 (*)    Creatinine, Ser 1.59 (*)    Calcium 7.8 (*)    Total Protein 6.4 (*)    Albumin 2.4 (*)    ALT 48 (*)    Alkaline Phosphatase 133 (*)    Total Bilirubin 2.0 (*)    GFR, Estimated 51 (*)    Anion gap 21 (*)    All other components within normal limits  CBC WITH  DIFFERENTIAL/PLATELET - Abnormal; Notable for the following components:   WBC 21.3 (*)    Neutro Abs 19.5 (*)    Abs Immature Granulocytes 0.41 (*)    All other components within normal limits  PROTIME-INR - Abnormal; Notable for the following components:   Prothrombin Time 15.7 (*)    INR 1.3 (*)    All other components within normal limits  URINALYSIS, ROUTINE W REFLEX MICROSCOPIC - Abnormal; Notable for the following components:   APPearance HAZY (*)    Glucose, UA >=500 (*)    Hgb urine dipstick LARGE (*)    Ketones, ur 40 (*)    All other components within normal limits  RAPID URINE DRUG SCREEN, HOSP PERFORMED - Abnormal; Notable for the following components:   Opiates POSITIVE (*)    All other components within normal limits  SALICYLATE LEVEL - Abnormal; Notable for the following components:  Salicylate Lvl <6.3 (*)    All other components within normal limits  URINALYSIS, MICROSCOPIC (REFLEX) - Abnormal; Notable for the following components:   Bacteria, UA FEW (*)    All other components within normal limits  BASIC METABOLIC PANEL - Abnormal; Notable for the following components:   Sodium 120 (*)    Chloride 89 (*)    CO2 14 (*)    Glucose, Bld 539 (*)    BUN 29 (*)    Creatinine, Ser 1.51 (*)    Calcium 7.6 (*)    GFR, Estimated 55 (*)    Anion gap 17 (*)    All other components within normal limits  SEDIMENTATION RATE - Abnormal; Notable for the following components:   Sed Rate 60 (*)    All other components within normal limits  GLUCOSE, CAPILLARY - Abnormal; Notable for the following components:   Glucose-Capillary 381 (*)    All other components within normal limits  CBG MONITORING, ED - Abnormal; Notable for the following components:   Glucose-Capillary 524 (*)    All other components within normal limits  I-STAT VENOUS BLOOD GAS, ED - Abnormal; Notable for the following components:   pCO2, Ven 38.0 (*)    Bicarbonate 18.0 (*)    TCO2 19 (*)    Acid-base  deficit 7.0 (*)    Sodium 122 (*)    Calcium, Ion 1.09 (*)    All other components within normal limits  CBG MONITORING, ED - Abnormal; Notable for the following components:   Glucose-Capillary 543 (*)    All other components within normal limits  CBG MONITORING, ED - Abnormal; Notable for the following components:   Glucose-Capillary 505 (*)    All other components within normal limits  CBG MONITORING, ED - Abnormal; Notable for the following components:   Glucose-Capillary 464 (*)    All other components within normal limits  CBG MONITORING, ED - Abnormal; Notable for the following components:   Glucose-Capillary 428 (*)    All other components within normal limits  RESP PANEL BY RT-PCR (FLU A&B, COVID) ARPGX2  CULTURE, BLOOD (ROUTINE X 2)  CULTURE, BLOOD (ROUTINE X 2)  URINE CULTURE  MRSA PCR SCREENING  APTT  LIPASE, BLOOD  ACETAMINOPHEN LEVEL  AMMONIA  ETHANOL  CK  BLOOD GAS, VENOUS  ROCKY MTN SPOTTED FVR ABS PNL(IGG+IGM)  C-REACTIVE PROTEIN    EKG EKG Interpretation  Date/Time:  Friday April 22 2020 01:28:11 EST Ventricular Rate:  133 PR Interval:    QRS Duration: 94 QT Interval:  295 QTC Calculation: 439 R Axis:   72 Text Interpretation: Sinus tachycardia Borderline T abnormalities, inferior leads Baseline wander in lead(s) V2 No previous ECGs available Confirmed by Ripley Fraise [id number]) on 04/21/2020 1:30:00 AM   Radiology CT Head Wo Contrast  Result Date: 04/28/2020 CLINICAL DATA:  Delirium EXAM: CT HEAD WITHOUT CONTRAST TECHNIQUE: Contiguous axial images were obtained from the base of the skull through the vertex without intravenous contrast. COMPARISON:  None. FINDINGS: Brain: Normal anatomic configuration. No abnormal intra or extra-axial mass lesion or fluid collection. No abnormal mass effect or midline shift. No evidence of acute intracranial hemorrhage or infarct. Ventricular size is normal. Cerebellum unremarkable. Vascular: Unremarkable Skull:  Intact Sinuses/Orbits: Paranasal sinuses are clear. Orbits are unremarkable. Other: Mastoid air cells and middle ear cavities are clear. IMPRESSION: No acute intracranial abnormality. Electronically Signed   By: Fidela Salisbury MD   On: 04/27/2020 04:18   CT ABDOMEN PELVIS W CONTRAST  Result Date: 04/19/2020 CLINICAL DATA:  Abdominal pain, fever, vomiting EXAM: CT ABDOMEN AND PELVIS WITH CONTRAST TECHNIQUE: Multidetector CT imaging of the abdomen and pelvis was performed using the standard protocol following bolus administration of intravenous contrast. CONTRAST:  174mL OMNIPAQUE IOHEXOL 300 MG/ML  SOLN COMPARISON:  None. FINDINGS: Lower chest: The visualized lung bases are clear bilaterally. The visualized heart and pericardium are unremarkable. Hepatobiliary: No focal liver abnormality is seen. No gallstones, gallbladder wall thickening, or biliary dilatation. Pancreas: Unremarkable Spleen: There are multiple wedge-shaped areas of hypoattenuation within the upper pole of the spleen likely representing multiple small splenic infarcts. The spleen is of normal size. Splenic artery and vein are patent. Adrenals/Urinary Tract: Adrenal glands are unremarkable. Kidneys are normal, without renal calculi, focal lesion, or hydronephrosis. Bladder is unremarkable. Stomach/Bowel: The stomach, small bowel, and large bowel are unremarkable. The appendix is normal. No free intraperitoneal gas or fluid. Vascular/Lymphatic: The abdominal vasculature is unremarkable. Numerous surgical clips are seen within the retroperitoneum in keeping with retroperitoneal lymph node dissection. The right spermatic cord is absent in keeping with orchiectomy. No pathologic adenopathy within the abdomen and pelvis. Reproductive: Prostate is unremarkable. Other: Rectum unremarkable.  Tiny fat containing umbilical hernia. Musculoskeletal: No acute bone abnormality. 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 Surgical  changes of right orchiectomy and retroperitoneal lymph node dissection in keeping with treatment of testicular cancer. Electronically Signed   By: Fidela Salisbury MD   On: 04/07/2020 04:27   DG Chest Port 1 View  Result Date: 04/06/2020 CLINICAL DATA:  Sepsis EXAM: PORTABLE CHEST 1 VIEW COMPARISON:  None. FINDINGS: Lungs volumes are small, and there is mild left basilar atelectasis. No pneumothorax or pleural effusion. Cardiac size within normal limits. Pulmonary vascularity is normal. Osseous structures are age-appropriate. No acute bone abnormality. IMPRESSION: No active disease. Electronically Signed   By: Fidela Salisbury MD   On: 04/11/2020 02:55    Procedures .Critical Care Performed by: Ripley Fraise, MD Authorized by: Ripley Fraise, MD   Critical care provider statement:    Critical care time (minutes):  135   Critical care start time:  04/05/2020 2:15 AM   Critical care end time:  04/14/2020 4:30 AM   Critical care time was exclusive of:  Separately billable procedures and treating other patients   Critical care was necessary to treat or prevent imminent or life-threatening deterioration of the following conditions:  Respiratory failure, sepsis, CNS failure or compromise, shock, metabolic crisis and endocrine crisis   Critical care was time spent personally by me on the following activities:  Development of treatment plan with patient or surrogate, discussions with consultants, evaluation of patient's response to treatment, examination of patient, obtaining history from patient or surrogate, review of old charts, re-evaluation of patient's condition, pulse oximetry, ordering and review of radiographic studies, ordering and review of laboratory studies and ordering and performing treatments and interventions   I assumed direction of critical care for this patient from another provider in my specialty: no       Medications Ordered in ED Medications  lactated ringers infusion (  Intravenous Stopped 04/15/2020 0340)  insulin regular, human (MYXREDLIN) 100 units/ 100 mL infusion (7.5 Units/hr Intravenous New Bag/Given 04/05/2020 0412)  lactated ringers infusion ( Intravenous New Bag/Given 04/24/2020 0351)  dextrose 5 % in lactated ringers infusion (0 mLs Intravenous Hold 04/17/2020 0413)  dextrose 50 % solution 0-50 mL (has no administration in time range)  potassium chloride 10 mEq in 100  mL IVPB (0 mEq Intravenous Stopped 04/15/2020 0442)  lactated ringers bolus 1,000 mL (0 mLs Intravenous Stopped 04/14/2020 0320)    And  lactated ringers bolus 1,000 mL (0 mLs Intravenous Stopped 04/18/2020 0255)    And  lactated ringers bolus 1,000 mL (1,000 mLs Intravenous New Bag/Given 04/11/2020 0316)    And  lactated ringers bolus 1,000 mL (0 mLs Intravenous Stopped 04/29/2020 0359)  ceFEPIme (MAXIPIME) 2 g in sodium chloride 0.9 % 100 mL IVPB (0 g Intravenous Stopped 04/25/2020 0250)  piperacillin-tazobactam (ZOSYN) IVPB 3.375 g (0 g Intravenous Stopped 04/07/2020 0359)  vancomycin (VANCOCIN) IVPB 1000 mg/200 mL premix (0 mg Intravenous Stopped 04/07/2020 0428)  acetaminophen (TYLENOL) tablet 650 mg (650 mg Oral Given 04/19/2020 0215)  iohexol (OMNIPAQUE) 300 MG/ML solution 100 mL (100 mLs Intravenous Contrast Given 04/08/2020 0319)    ED Course  I have reviewed the triage vital signs and the nursing notes.  Pertinent labs & imaging results that were available during my care of the patient were reviewed by me and considered in my medical decision making (see chart for details).    MDM Rules/Calculators/A&P                          2:15 AM Patient presents with fevers and confusion.  Code sepsis has been called.  Unclear etiology of infection.  Patient reportedly had a gastrointestinal illness around November 10 and thought he strained his shoulder at that time and has been evaluated twice for right shoulder pain.  He also received an injection.  However he has minimal difficulty ranging the right  shoulder.  Unclear cause of sepsis at this time.  Labs and imaging are pending.  IV fluids and antibiotics have been ordered 3:08 AM X-ray has been read as negative.  No obvious signs of UTI. He does appear to be in DKA which is new onset. However he does have liver dysfunction on labs. Due to liver dysfunction, recent vomiting and fever, will proceed with CT abdomen pelvis Will also add on CT head. If CT scans are negative, at that point I would consider performing lumbar puncture due to fever and altered mental status 4:13 AM Patient is more alert at this time.  He is answering questions appropriately Awaiting CT scan results 4:44 AM CT head negative.  CT of pelvis does not reveal any acute infectious etiology, but does have possible splenic infarct. Patient is now awake alert, no meningeal signs, denies headache.  He has a normal mental status. On reassessment of the skin, he does appear to have rash throughout his body that is new        5:17 AM Discussed with Dr. Myna Hidalgo for admission We discussed lab findings and his physical exam findings. Given the presence of a splenic infarct as well as new onset rash/purpura skin nodule on his fingers, this could represent endocarditis. Patient [facility]. I will also consult w/Ortho care as he may have septic arthritis in the right shoulder 6:43 AM D/w Dr Erlinda Hong with [facility], his service will see patient once he is at [facility]  Final Clinical Impression(s) / ED Diagnoses Final diagnoses:  Diabetic ketoacidosis without coma associated with other specified diabetes mellitus ([place])  AKI (acute kidney injury) ([place])  Splenic infarct  Dehydration    Rx / DC Orders ED Discharge Orders    None       Ripley Fraise, MD 04/11/2020 [phone]

## 2020-04-22 NOTE — Progress Notes (Addendum)
Pharmacy Antibiotic Note  Jonathan Shaffer is a 54 y.o. male admitted on 04/05/2020 with fevers, confusion noted to be in DKA, apparently new-onset. Patient has liver dysfunction and found to have splenic infarct. Rash/purpura is present through ought his body as well as noted skin nodule on his fingers that could represent endocarditis per EDP. Patient has Pharmacy has been consulted for vancomycin and cefepime dosing.  Plan: Increase vancomycin dosing to 1000 mg iv q 12 hours due to possible joint infection, possible endocarditis  Cefepime 2 g iv q 8 hours  F/U renal function, clinical course, plans for antibiotics  De-escalate, check levels, as indicated/when appropriate  Height: 6\' 3"  (190.5 cm) Weight: 126.8 kg (279 lb 8.7 oz) IBW/kg (Calculated) : 84.5  Temp (24hrs), Avg:100.5 F (38.1 C), Min:97.9 F (36.6 C), Max:104 F (40 C)  Recent Labs  Lab 04/05/2020 0202 04/29/2020 0405  WBC 21.3*  --   CREATININE 1.59* 1.51*  LATICACIDVEN 3.3* 2.5*    Estimated Creatinine Clearance: 80.2 mL/min (A) (by C-G formula based on SCr of 1.51 mg/dL (H)).    No Known Allergies  Antimicrobials this admission: 11/19 Zosyn x 1 11/19 cefepime >>  11/19 vancomycin >>   Dose adjustments this admission:   Microbiology results: 11/19 BCx:  11/19 UCx:  11/19 MRSA PCR:  11/19 resp panel: covid/flu neg  Thank you for allowing pharmacy to be a part of this patient's care.  [name] 04/27/2020 8:08 AM

## 2020-04-22 NOTE — Care Plan (Signed)
Okay to place PICC in spite of pending blood cultures, benefit outweighs risk.

## 2020-04-22 NOTE — ED Notes (Signed)
Per pt wife, pt has had recent urinary frequency. Pt urinated in bed, attempted both Primofit and Condom catheter, pt pulled both off. Sheets and gown changed. Urinals at bedside, call bell within reach.

## 2020-04-22 NOTE — TOC Initial Note (Signed)
Transition of Care [facility]) - Initial/Assessment Note    Patient Details  Name: Jonathan Shaffer MRN: [id number] Date of Birth: [birth year]  [facility] CM/SW Contact:    Leeroy Cha, RN Phone Number: 04/04/2020, 8:55 AM  Clinical Narrative:                 54 y.o. male who presents with sepsis as well as right shoulder pain to the [facility], ER last night.  He had a rectal temperature of 104degrees.  He was recently seen at our office and received an ultrasound-guided glenohumeral cortisone injection by Dr. Junius Roads.  He states that he did feel relief from this injection but recently his shoulder began hurting more with range of motion and activity and use.  He denies any trauma or injuries.  Denies any numbness and tingling.  plan is home with self care has pcp and ortho md Bld Glucose is 539, lactic acid 2.5, wbc 21.3,iv abx, iv insulinj, iv d5lr at 125 and iv lr at 125cc/hr, Following for progression. Expected Discharge Plan: Home/Self Care Barriers to Discharge: No Barriers Identified   Patient Goals and CMS Choice Patient states their goals for this hospitalization and ongoing recovery are:: to go home      Expected Discharge Plan and Services Expected Discharge Plan: Home/Self Care   Discharge Planning Services: CM Consult   Living arrangements for the past 2 months: Single Family Home                                      Prior Living Arrangements/Services Living arrangements for the past 2 months: Single Family Home Lives with:: Spouse Patient language and need for interpreter reviewed:: Yes Do you feel safe going back to the place where you live?: Yes      Need for Family Participation in Patient Care: Yes (Comment) Care giver support system in place?: Yes (comment)   Criminal Activity/Legal Involvement Pertinent to Current Situation/Hospitalization: No - Comment as needed  Activities of Daily Living      Permission Sought/Granted                   Emotional Assessment Appearance:: Appears stated age Attitude/Demeanor/Rapport: Engaged Affect (typically observed): Calm Orientation: : Oriented to Situation, Oriented to  Time, Oriented to Place, Oriented to Self Alcohol / Substance Use: Not Applicable Psych Involvement: No (comment)  Admission diagnosis:  Dehydration [E86.0] Splenic infarct [D73.5] AKI (acute kidney injury) ([place]) [N17.9] Severe sepsis ([place]) [A41.9, R65.20] Diabetic ketoacidosis without coma associated with other specified diabetes mellitus ([place]) [E13.10] Patient Active Problem List   Diagnosis Date Noted  . Severe sepsis ([place]) 04/30/2020  . Splenic infarct 04/06/2020  . Diabetic acidosis without coma ([place]) 04/18/2020  . AKI (acute kidney injury) ([place]) 04/18/2020   PCP:  Patient, No Pcp Per Pharmacy:   [facility] [place], [place] [address] Phone: [phone] Fax: [phone]     Social Determinants of Health (SDOH) Interventions    Readmission Risk Interventions No flowsheet data found.

## 2020-04-22 NOTE — Care Plan (Signed)
Pt rec'd from [facility] to ICU 1237 on insulin gtt @7 .5 and LR @125 . Pt brought by [facility] via stretcher. BS on arrival 381. Pt tachypneic, tachycardic, and has intermittent confusion. Placed on all ICU monitoring equipment. RN observed skin alterations to L great toe and R shoulder.

## 2020-04-22 NOTE — Consult Note (Signed)
ORTHOPAEDIC CONSULTATION  REQUESTING PHYSICIAN: Edwin Dada, *  Chief Complaint: Right shoulder pain  HPI: Jonathan Shaffer is a 54 y.o. male who presents with sepsis as well as right shoulder pain to the [facility], ER last night.  He had a rectal temperature of 104degrees.  He was recently seen at our office and received an ultrasound-guided glenohumeral cortisone injection by Dr. Junius Roads.  He states that he did feel relief from this injection but recently his shoulder began hurting more with range of motion and activity and use.  He denies any trauma or injuries.  Denies any numbness and tingling.  Past Medical History:  Diagnosis Date  . Cancer ([place])   . High cholesterol   . Thyroid disease    History reviewed. No pertinent surgical history. Social History   Socioeconomic History  . Marital status: Married    Spouse name: Not on file  . Number of children: Not on file  . Years of education: Not on file  . Highest education level: Not on file  Occupational History  . Not on file  Tobacco Use  . Smoking status: Never Smoker  . Smokeless tobacco: Never Used  Substance and Sexual Activity  . Alcohol use: Yes  . Drug use: Never  . Sexual activity: Not on file  Other Topics Concern  . Not on file  Social History Narrative  . Not on file   Social Determinants of Health   Financial Resource Strain:   . Difficulty of Paying Living Expenses: Not on file  Food Insecurity:   . Worried About Charity fundraiser in the Last Year: Not on file  . Ran Out of Food in the Last Year: Not on file  Transportation Needs:   . Lack of Transportation (Medical): Not on file  . Lack of Transportation (Non-Medical): Not on file  Physical Activity:   . Days of Exercise per Week: Not on file  . Minutes of Exercise per Session: Not on file  Stress:   . Feeling of Stress : Not on file  Social Connections:   . Frequency of Communication with Friends and Family: Not on file  .  Frequency of Social Gatherings with Friends and Family: Not on file  . Attends Religious Services: Not on file  . Active Member of Clubs or Organizations: Not on file  . Attends Archivist Meetings: Not on file  . Marital Status: Not on file   No family history on file. - negative except otherwise stated in the family history section No Known Allergies Prior to Admission medications   Medication Sig Start Date End Date Taking? Authorizing Provider  lidocaine (LIDODERM) 5 % Place 1 patch onto the skin daily. Remove & Discard patch within 12 hours or as directed by MD 04/17/20  Yes Tacy Learn, PA-C  predniSONE (DELTASONE) 10 MG tablet Take as directed for 12 days.  Daily dose 6,6,5,5,4,4,3,3,2,2,1,1. 04/21/20  Yes Hilts, Legrand Como, MD  traMADol (ULTRAM) 50 MG tablet Take 1-2 tablets (50-100 mg total) by mouth every 6 (six) hours as needed for moderate pain. 04/19/20  Yes Hilts, Legrand Como, MD   CT Head Wo Contrast  Result Date: 04/15/2020 CLINICAL DATA:  Delirium EXAM: CT HEAD WITHOUT CONTRAST TECHNIQUE: Contiguous axial images were obtained from the base of the skull through the vertex without intravenous contrast. COMPARISON:  None. FINDINGS: Brain: Normal anatomic configuration. No abnormal intra or extra-axial mass lesion or fluid collection. No abnormal mass effect or midline  shift. No evidence of acute intracranial hemorrhage or infarct. Ventricular size is normal. Cerebellum unremarkable. Vascular: Unremarkable Skull: Intact Sinuses/Orbits: Paranasal sinuses are clear. Orbits are unremarkable. Other: Mastoid air cells and middle ear cavities are clear. IMPRESSION: No acute intracranial abnormality. Electronically Signed   By: Fidela Salisbury MD   On: 04/05/2020 04:18   CT ABDOMEN PELVIS W CONTRAST  Result Date: 04/27/2020 CLINICAL DATA:  Abdominal pain, fever, vomiting EXAM: CT ABDOMEN AND PELVIS WITH CONTRAST TECHNIQUE: Multidetector CT imaging of the abdomen and pelvis was  performed using the standard protocol following bolus administration of intravenous contrast. CONTRAST:  124mL OMNIPAQUE IOHEXOL 300 MG/ML  SOLN COMPARISON:  None. FINDINGS: Lower chest: The visualized lung bases are clear bilaterally. The visualized heart and pericardium are unremarkable. Hepatobiliary: No focal liver abnormality is seen. No gallstones, gallbladder wall thickening, or biliary dilatation. Pancreas: Unremarkable Spleen: There are multiple wedge-shaped areas of hypoattenuation within the upper pole of the spleen likely representing multiple small splenic infarcts. The spleen is of normal size. Splenic artery and vein are patent. Adrenals/Urinary Tract: Adrenal glands are unremarkable. Kidneys are normal, without renal calculi, focal lesion, or hydronephrosis. Bladder is unremarkable. Stomach/Bowel: The stomach, small bowel, and large bowel are unremarkable. The appendix is normal. No free intraperitoneal gas or fluid. Vascular/Lymphatic: The abdominal vasculature is unremarkable. Numerous surgical clips are seen within the retroperitoneum in keeping with retroperitoneal lymph node dissection. The right spermatic cord is absent in keeping with orchiectomy. No pathologic adenopathy within the abdomen and pelvis. Reproductive: Prostate is unremarkable. Other: Rectum unremarkable.  Tiny fat containing umbilical hernia. Musculoskeletal: No acute bone abnormality. 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 Electronically Signed   By: Fidela Salisbury MD   On: 05/02/2020 04:27   DG Chest Port 1 View  Result Date: 04/10/2020 CLINICAL DATA:  Sepsis EXAM: PORTABLE CHEST 1 VIEW COMPARISON:  None. FINDINGS: Lungs volumes are small, and there is mild left basilar atelectasis. No pneumothorax or pleural effusion. Cardiac size within normal limits. Pulmonary  vascularity is normal. Osseous structures are age-appropriate. No acute bone abnormality. IMPRESSION: No active disease. Electronically Signed   By: Fidela Salisbury MD   On: 04/18/2020 02:55   - pertinent xrays, CT, MRI studies were reviewed and independently interpreted  Positive ROS: All other systems have been reviewed and were otherwise negative with the exception of those mentioned in the HPI and as above.  Physical Exam: General: No acute distress Cardiovascular: No pedal edema Respiratory: No cyanosis, no use of accessory musculature GI: No organomegaly, abdomen is soft and non-tender Skin: No lesions in the area of chief complaint Neurologic: Sensation intact distally Psychiatric: Patient is at baseline mood and affect Lymphatic: No axillary or cervical lymphadenopathy  MUSCULOSKELETAL:  Right shoulder shows mild swelling.  There is some erythema overlying the AC joint.  AC joint is tender to palpation.  He has increased pain with cross body adduction.  Glenohumeral range of motion does not elicit any significant pain.  No neurovascular compromise of the right upper extremity.  Assessment: Right shoulder pain possible septic AC joint  Plan: I recommend MRI of the right shoulder to evaluate for infection.    Thank you for the consult and the opportunity to see Mr. Jonathan Hayter, MD [facility] 8:13 AM

## 2020-04-22 NOTE — Progress Notes (Signed)
Inpatient Diabetes Program Recommendations  AACE/ADA: New Consensus Statement on Inpatient Glycemic Control (2015)  Target Ranges:  Prepandial:   less than 140 mg/dL      Peak postprandial:   less than 180 mg/dL (1-2 hours)      Critically ill patients:  140 - 180 mg/dL   Lab Results  Component Value Date   GLUCAP 234 (H) 04/21/2020    Review of Glycemic Control  Diabetes history: type 2  Outpatient Diabetes medications: none Current orders for Inpatient glycemic control: IV insulin  Inpatient Diabetes Program Recommendations:   Noted that patient was admitted with blood sugar of 572 mg/dl. Started on IV insulin drip.   Recommend transition insulin dosages as weight based: Levemir 12 units BID (126.8 kg X 0.2 units/kg=25.36 units) and Novolog SENSITIVE correction scale every 4 hours and then TID when eating. Add Novolog 0-5 units HS scale.   Will continue to monitor blood sugars while in the hospital.  Harvel Ricks RN BSN CDE Diabetes Coordinator Pager: [phone]  8am-5pm

## 2020-04-22 NOTE — ED Notes (Signed)
hemolyzed labs recollected and sent to lab.

## 2020-04-22 NOTE — ED Notes (Signed)
ED Provider at bedside. 

## 2020-04-22 NOTE — Progress Notes (Signed)
Pharmacy Antibiotic Note  Jonathan Shaffer is a 54 y.o. male admitted on 04/27/2020 with sepsis.  Pharmacy has been consulted for Vancmoycin dosing. WBC is elevated. Noted renal dysfunction.   Plan: Vancomycin 2000 mg IV x 1, then 750 mg IV q12h, increase dose if Scr improves Cefepime 2g IV x 1, f/u additional gram negative coverage Trend WBC, temp, renal function  F/U infectious work-up Drug levels as indicated   Height: 6\' 3"  (190.5 cm) Weight: 117 kg (257 lb 15 oz) IBW/kg (Calculated) : 84.5  Temp (24hrs), Avg:100.8 F (38.2 C), Min:97.9 F (36.6 C), Max:104 F (40 C)  Recent Labs  Lab 04/13/2020 0202 04/23/2020 0405  WBC 21.3*  --   CREATININE 1.59* 1.51*  LATICACIDVEN 3.3* 2.5*    Estimated Creatinine Clearance: 77.1 mL/min (A) (by C-G formula based on SCr of 1.51 mg/dL (H)).    No Known Allergies  Narda Bonds, PharmD, BCPS Clinical Pharmacist Phone: [phone]

## 2020-04-22 NOTE — Progress Notes (Signed)
Peripherally Inserted Central Catheter Placement  The IV Nurse has discussed with the patient and/or persons authorized to consent for the patient, the purpose of this procedure and the potential benefits and risks involved with this procedure.  The benefits include less needle sticks, lab draws from the catheter, and the patient may be discharged home with the catheter. Risks include, but not limited to, infection, bleeding, blood clot (thrombus formation), and puncture of an artery; nerve damage and irregular heartbeat and possibility to perform a PICC exchange if needed/ordered by physician.  Alternatives to this procedure were also discussed.  Bard Power PICC patient education guide, fact sheet on infection prevention and patient information card has been provided to patient /or left at bedside.    PICC Placement Documentation  PICC Triple Lumen 11/57/26 PICC Right Basilic 41 cm 1 cm (Active)  Exposed Catheter (cm) 1 cm 04/16/2020 1539  Site Assessment Clean;Intact;Dry 04/28/2020 1539  Lumen #1 Status Blood return noted;Flushed 04/17/2020 1539  Lumen #2 Status Blood return noted;Flushed 04/30/2020 1539  Lumen #3 Status Blood return noted;Flushed 04/21/2020 1539  Dressing Type Transparent;Securing device 04/23/2020 1539  Antimicrobial disc in place? Yes 04/19/2020 1539  Safety Lock Not Applicable 20/35/59 7416  Dressing Change Due 04/29/20 05/02/2020 [place] 04/18/2020, 3:42 PM

## 2020-04-22 NOTE — Progress Notes (Signed)
CRITICAL VALUE ALERT  Critical Value: 3.2  Date & Time Notied:  11/19, 1505  Provider Notified: Yes, [place], md  Orders Received/Actions taken: YES

## 2020-04-22 NOTE — Progress Notes (Signed)
  Echocardiogram 2D Echocardiogram has been performed.  Jennette Dubin 04/11/2020, 12:19 PM

## 2020-04-22 NOTE — ED Notes (Signed)
Date and time results received: 04/09/2020 0447  Test: lactic Critical Value: 2.5  Name of Provider Notified: Dr. Christy Gentles  Orders Received? Or Actions Taken?: no new orders

## 2020-04-23 ENCOUNTER — Inpatient Hospital Stay (HOSPITAL_COMMUNITY): Payer: BC Managed Care – PPO

## 2020-04-23 ENCOUNTER — Encounter (HOSPITAL_COMMUNITY): Payer: Self-pay | Admitting: Family Medicine

## 2020-04-23 DIAGNOSIS — M7989 Other specified soft tissue disorders: Secondary | ICD-10-CM | POA: Diagnosis not present

## 2020-04-23 DIAGNOSIS — A419 Sepsis, unspecified organism: Secondary | ICD-10-CM | POA: Diagnosis not present

## 2020-04-23 DIAGNOSIS — D735 Infarction of spleen: Secondary | ICD-10-CM

## 2020-04-23 DIAGNOSIS — R7881 Bacteremia: Secondary | ICD-10-CM

## 2020-04-23 DIAGNOSIS — D649 Anemia, unspecified: Secondary | ICD-10-CM

## 2020-04-23 DIAGNOSIS — R609 Edema, unspecified: Secondary | ICD-10-CM | POA: Diagnosis not present

## 2020-04-23 DIAGNOSIS — E860 Dehydration: Secondary | ICD-10-CM

## 2020-04-23 DIAGNOSIS — B9561 Methicillin susceptible Staphylococcus aureus infection as the cause of diseases classified elsewhere: Secondary | ICD-10-CM

## 2020-04-23 DIAGNOSIS — R6521 Severe sepsis with septic shock: Secondary | ICD-10-CM

## 2020-04-23 DIAGNOSIS — R739 Hyperglycemia, unspecified: Secondary | ICD-10-CM

## 2020-04-23 DIAGNOSIS — N179 Acute kidney failure, unspecified: Secondary | ICD-10-CM

## 2020-04-23 LAB — BASIC METABOLIC PANEL
Anion gap: 9 (ref 5–15)
Anion gap: 13 (ref 5–15)
BUN: 25 mg/dL — ABNORMAL HIGH (ref 6–20)
BUN: 26 mg/dL — ABNORMAL HIGH (ref 6–20)
CO2: 20 mmol/L — ABNORMAL LOW (ref 22–32)
CO2: 16 mmol/L — ABNORMAL LOW (ref 22–32)
Calcium: 7.3 mg/dL — ABNORMAL LOW (ref 8.9–10.3)
Calcium: 7.5 mg/dL — ABNORMAL LOW (ref 8.9–10.3)
Chloride: 103 mmol/L (ref 98–111)
Chloride: 101 mmol/L (ref 98–111)
Creatinine, Ser: 1.41 mg/dL — ABNORMAL HIGH (ref 0.61–1.24)
Creatinine, Ser: 1.38 mg/dL — ABNORMAL HIGH (ref 0.61–1.24)
GFR, Estimated: 59 mL/min — ABNORMAL LOW (ref >60)
GFR, Estimated: >60 mL/min (ref >60)
Glucose, Bld: 198 mg/dL — ABNORMAL HIGH (ref 70–99)
Glucose, Bld: 328 mg/dL — ABNORMAL HIGH (ref 70–99)
Potassium: 3.3 mmol/L — ABNORMAL LOW (ref 3.5–5.1)
Potassium: 3.6 mmol/L (ref 3.5–5.1)
Sodium: 132 mmol/L — ABNORMAL LOW (ref 135–145)
Sodium: 130 mmol/L — ABNORMAL LOW (ref 135–145)

## 2020-04-23 LAB — COMPREHENSIVE METABOLIC PANEL WITH GFR
ALT: 32 U/L (ref 0–44)
AST: 41 U/L (ref 15–41)
Albumin: 1.7 g/dL — ABNORMAL LOW (ref 3.5–5.0)
Alkaline Phosphatase: 75 U/L (ref 38–126)
Anion gap: 9 (ref 5–15)
BUN: 25 mg/dL — ABNORMAL HIGH (ref 6–20)
CO2: 22 mmol/L (ref 22–32)
Calcium: 7.4 mg/dL — ABNORMAL LOW (ref 8.9–10.3)
Chloride: 103 mmol/L (ref 98–111)
Creatinine, Ser: 1.34 mg/dL — ABNORMAL HIGH (ref 0.61–1.24)
GFR, Estimated: >60 mL/min
Glucose, Bld: 173 mg/dL — ABNORMAL HIGH (ref 70–99)
Potassium: 2.8 mmol/L — ABNORMAL LOW (ref 3.5–5.1)
Sodium: 134 mmol/L — ABNORMAL LOW (ref 135–145)
Total Bilirubin: 1.5 mg/dL — ABNORMAL HIGH (ref 0.3–1.2)
Total Protein: 4.9 g/dL — ABNORMAL LOW (ref 6.5–8.1)

## 2020-04-23 LAB — GLUCOSE, CAPILLARY
Glucose-Capillary: 136 mg/dL — ABNORMAL HIGH (ref 70–99)
Glucose-Capillary: 139 mg/dL — ABNORMAL HIGH (ref 70–99)
Glucose-Capillary: 146 mg/dL — ABNORMAL HIGH (ref 70–99)
Glucose-Capillary: 162 mg/dL — ABNORMAL HIGH (ref 70–99)
Glucose-Capillary: 165 mg/dL — ABNORMAL HIGH (ref 70–99)
Glucose-Capillary: 168 mg/dL — ABNORMAL HIGH (ref 70–99)
Glucose-Capillary: 170 mg/dL — ABNORMAL HIGH (ref 70–99)
Glucose-Capillary: 172 mg/dL — ABNORMAL HIGH (ref 70–99)
Glucose-Capillary: 172 mg/dL — ABNORMAL HIGH (ref 70–99)
Glucose-Capillary: 188 mg/dL — ABNORMAL HIGH (ref 70–99)
Glucose-Capillary: 193 mg/dL — ABNORMAL HIGH (ref 70–99)
Glucose-Capillary: 194 mg/dL — ABNORMAL HIGH (ref 70–99)
Glucose-Capillary: 296 mg/dL — ABNORMAL HIGH (ref 70–99)
Glucose-Capillary: 305 mg/dL — ABNORMAL HIGH (ref 70–99)
Glucose-Capillary: 338 mg/dL — ABNORMAL HIGH (ref 70–99)

## 2020-04-23 LAB — CBC
HCT: 32.8 % — ABNORMAL LOW (ref 39.0–52.0)
Hemoglobin: 11 g/dL — ABNORMAL LOW (ref 13.0–17.0)
MCH: 30 pg (ref 26.0–34.0)
MCHC: 33.5 g/dL (ref 30.0–36.0)
MCV: 89.4 fL (ref 80.0–100.0)
Platelets: 151 K/uL (ref 150–400)
RBC: 3.67 MIL/uL — ABNORMAL LOW (ref 4.22–5.81)
RDW: 14 % (ref 11.5–15.5)
WBC: 16.3 K/uL — ABNORMAL HIGH (ref 4.0–10.5)
nRBC: 0 % (ref 0.0–0.2)

## 2020-04-23 LAB — LACTIC ACID, PLASMA
Lactic Acid, Venous: 9 mmol/L (ref 0.5–1.9)
Lactic Acid, Venous: 1.3 mmol/L (ref 0.5–1.9)
Lactic Acid, Venous: 1.4 mmol/L (ref 0.5–1.9)

## 2020-04-23 LAB — BETA-HYDROXYBUTYRIC ACID: Beta-Hydroxybutyric Acid: 0.22 mmol/L (ref 0.05–0.27)

## 2020-04-23 LAB — MAGNESIUM: Magnesium: 2 mg/dL (ref 1.7–2.4)

## 2020-04-23 IMAGING — MR MR HEAD W/O CM
10 of 11 series · 37 of 48 positions shown · non-contrast
Comparison: None.

CLINICAL DATA: Altered mental status

EXAM:
MRI HEAD WITHOUT CONTRAST
TECHNIQUE: Multiplanar, multiecho pulse sequences of the brain and surrounding
structures were obtained without intravenous contrast.

[Series 5: dwi_tracew · axial · 3.0mm · 1.08mm/px · z∈[-48,+120]mm · 9 of 114 slices shown]
[im 1/114]
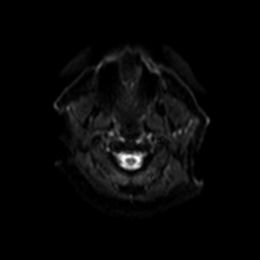
[im 15/114]
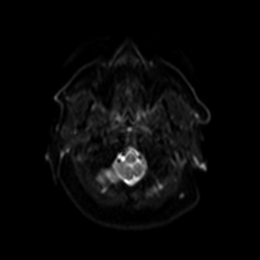
[im 29/114]
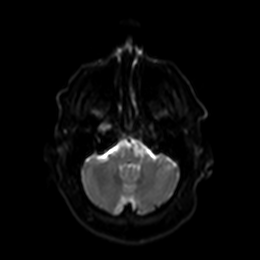
[im 43/114]
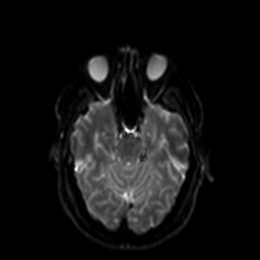
[im 57/114]
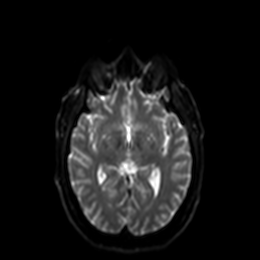
[im 71/114]
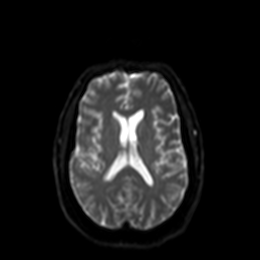
[im 85/114]
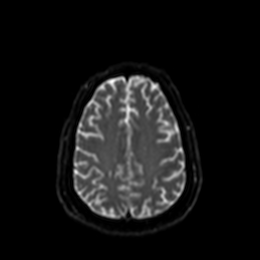
[im 99/114]
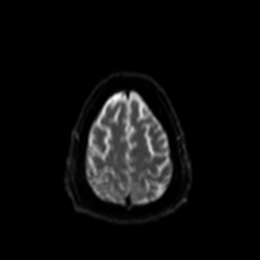
[im 114/114]
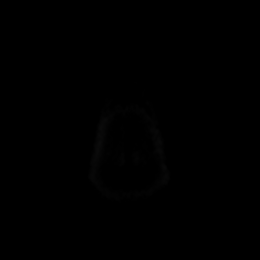

[Series 6: dwi_adc · axial · 3.0mm · 1.08mm/px · z∈[-48,+120]mm · 4 of 57 slices shown]
[im 1/57]
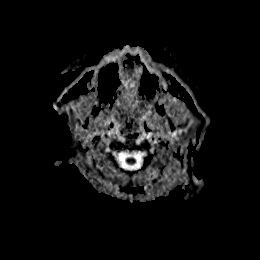
[im 19/57]
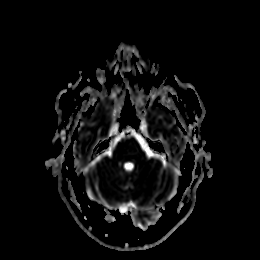
[im 38/57]
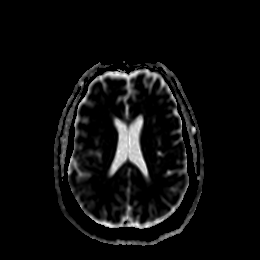
[im 57/57]
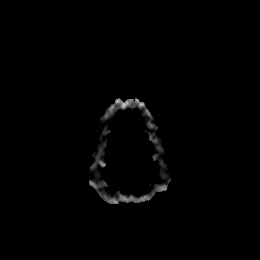

[Series 7: T2 · sagittal · 5.0mm · 0.47mm/px · 2 of 24 slices shown (1 of 3)]
[im 1/24]
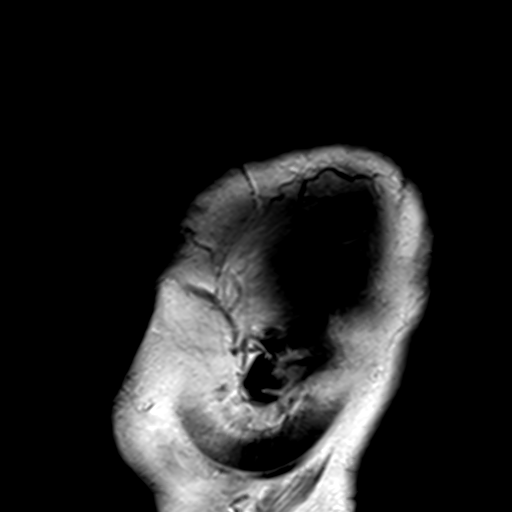
[im 24/24]
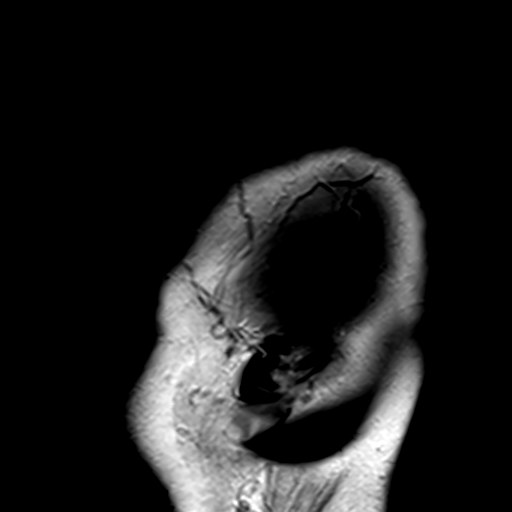

[Series 8: T2 · axial · 5.0mm · 0.47mm/px · z∈[-50,+125]mm · 2 of 28 slices shown (2 of 3)]
[im 1/28]
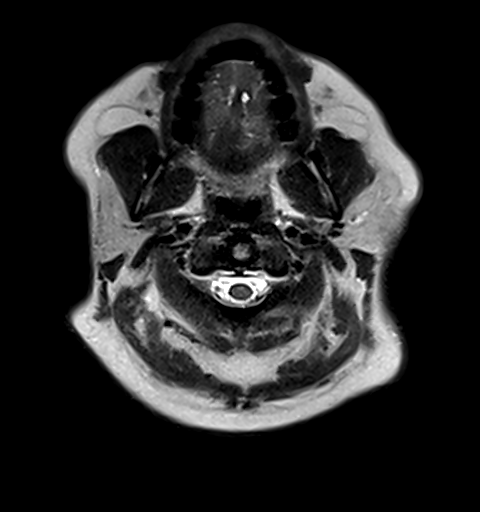
[im 28/28]
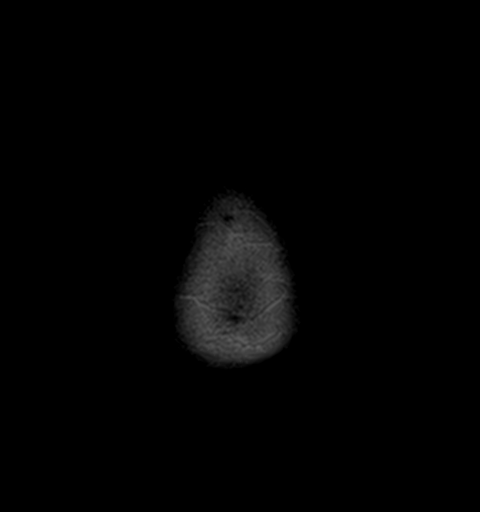

[Series 9: GRE · axial · 3.0mm · 0.47mm/px · z∈[-45,+123]mm · 4 of 57 slices shown]
[im 1/57]
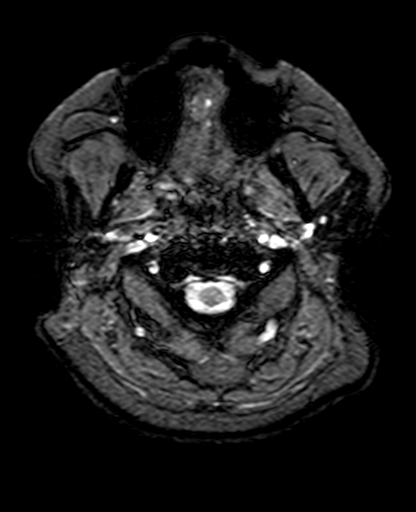
[im 19/57]
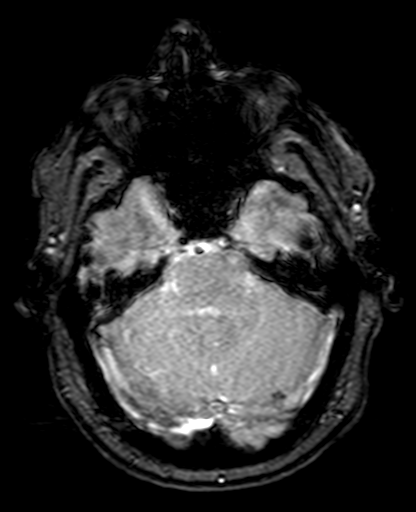
[im 38/57]
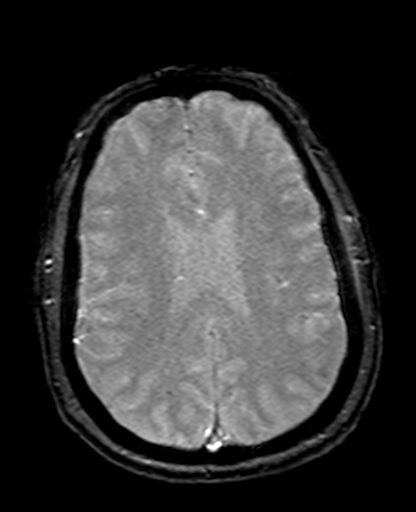
[im 57/57]
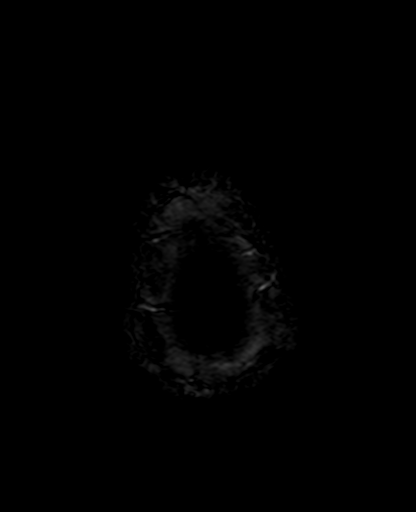

[Series 10: FLAIR · axial · 3.0mm · 0.89mm/px · z∈[-46,+122]mm · 4 of 57 slices shown]
[im 1/57]
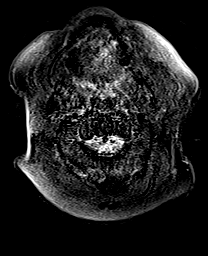
[im 19/57]
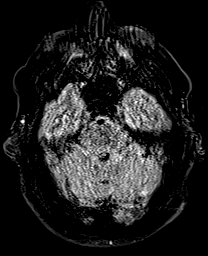
[im 38/57]
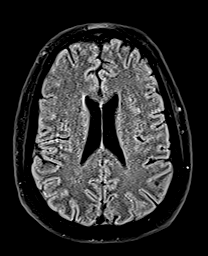
[im 57/57]
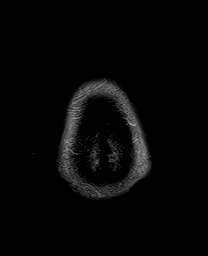

[Series 11: T1 · axial · 3.0mm · 0.47mm/px · z∈[-42,+126]mm · 4 of 57 slices shown]
[im 1/57]
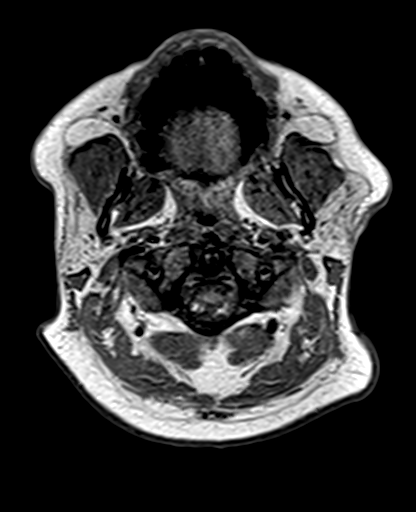
[im 19/57]
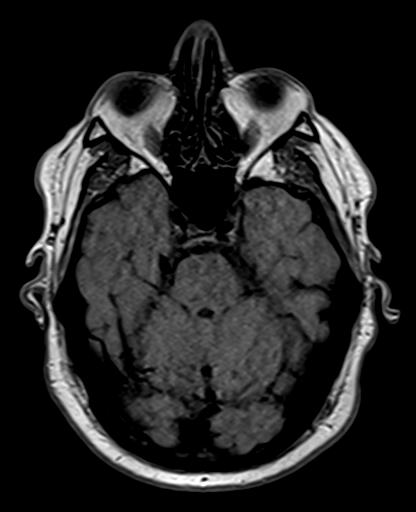
[im 38/57]
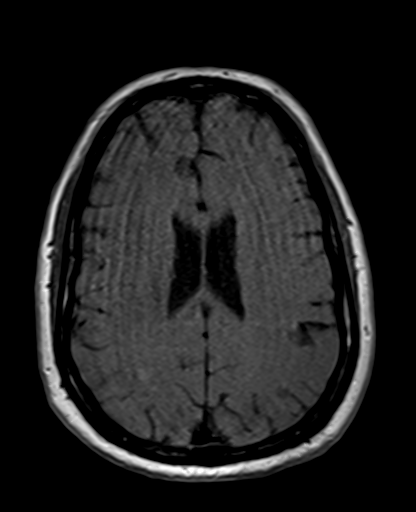
[im 57/57]
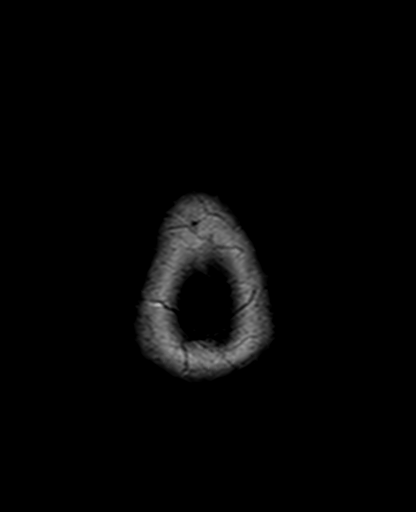

[Series 12: DWI · coronal · 5.0mm · 1.31mm/px · 4 of 56 slices shown (1 of 2)]
[im 1/56]
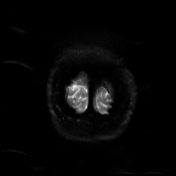
[im 19/56]
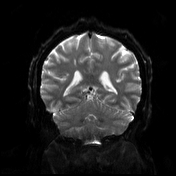
[im 37/56]
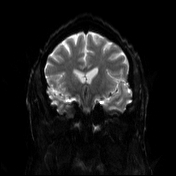
[im 56/56]
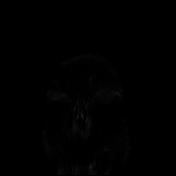

[Series 13: DWI · coronal · 5.0mm · 1.31mm/px · 2 of 28 slices shown (2 of 2)]
[im 1/28]
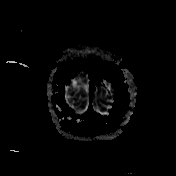
[im 28/28]
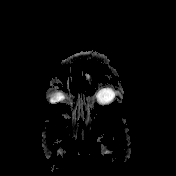

[Series 14: T2 · coronal · 5.0mm · 0.86mm/px · 2 of 28 slices shown (3 of 3)]
[im 1/28]
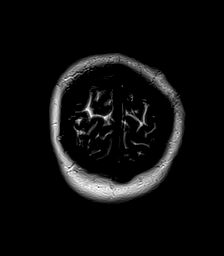
[im 28/28]
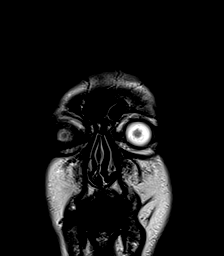

[37 of 48 positions shown; findings below may reference images not displayed]

FINDINGS: Motion artifact is present.

Brain: There are scattered small foci of restricted diffusion in the
parietal lobes bilaterally, right greater than left. Additional
involvement of the posterior left frontal lobe, left
occipitotemporal lobe, and bilateral cerebellum.

Minimal punctate foci of susceptibility in the bilateral parietal
and right occipital lobes probably reflect chronic microhemorrhages.
There is no intracranial mass or significant mass effect. There is
no hydrocephalus or extra-axial fluid collection. Ventricles and
sulci are normal in size and configuration.

Vascular: Major vessel flow voids at the skull base are preserved.

Skull and upper cervical spine: Normal marrow signal is preserved.

Sinuses/Orbits: Paranasal sinuses are aerated. Orbits are
unremarkable.

Other: Sella is unremarkable.  Mastoid air cells are clear.
IMPRESSION: Small acute infarcts in the bilateral parietal lobes, posterior left
frontal lobe, left occipitotemporal lobe, and bilateral cerebellum.

These results will be called to the ordering clinician or
representative by the Radiologist Assistant, and communication
documented in the PACS or [REDACTED].

## 2020-04-23 IMAGING — MR MR SHOULDER*R* W/O CM
5 of 6 series · 36 of 40 positions shown · non-contrast
Comparison: X-ray [DATE]

CLINICAL DATA: Right shoulder pain, sepsis.  Concern for infection

EXAM:
MRI OF THE RIGHT SHOULDER WITHOUT CONTRAST
TECHNIQUE: Multiplanar, multisequence MR imaging of the shoulder was performed.
No intravenous contrast was administered.

[Series 6: T2 fat-sat · axial · right · 4.0mm · 0.62mm/px · z∈[-90,+60]mm · 8 of 36 slices shown (1 of 3)]
[im 1/36]
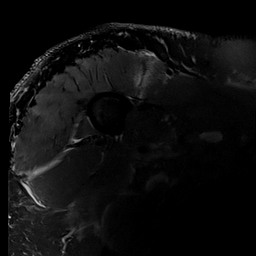
[im 6/36]
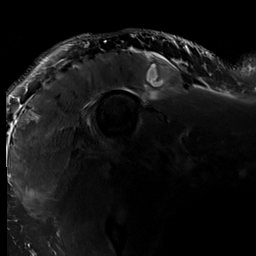
[im 11/36]
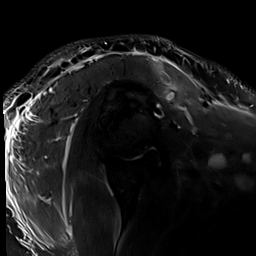
[im 16/36]
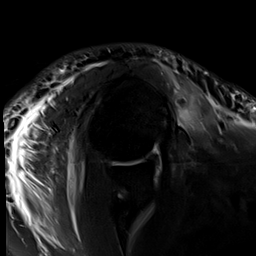
[im 21/36]
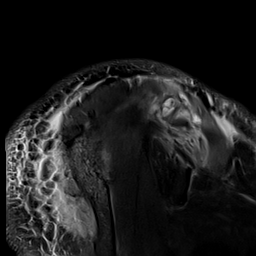
[im 26/36]
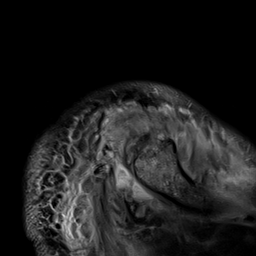
[im 31/36]
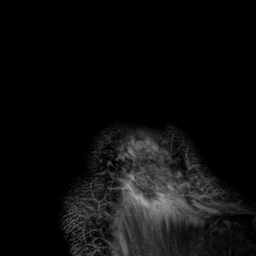
[im 36/36]
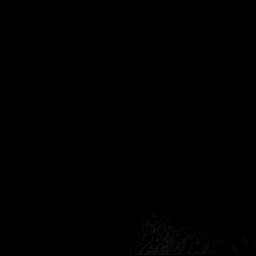

[Series 7: T2 fat-sat · oblique · right · 4.0mm · 0.62mm/px · 6 of 30 slices shown (2 of 3)]
[im 1/30]
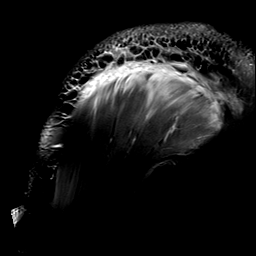
[im 6/30]
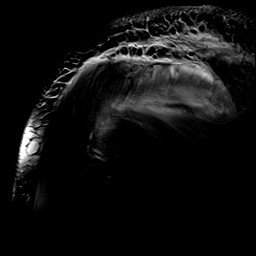
[im 12/30]
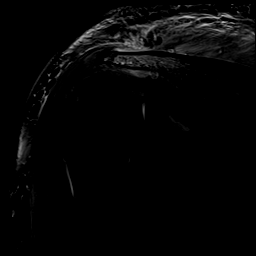
[im 18/30]
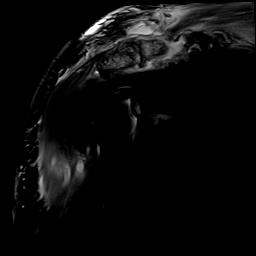
[im 24/30]
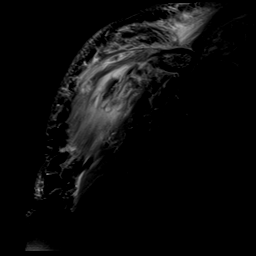
[im 30/30]
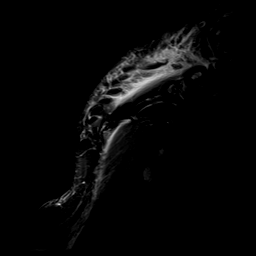

[Series 8: T1 · oblique · right · 3.5mm · 0.71mm/px · 7 of 33 slices shown]
[im 1/33]
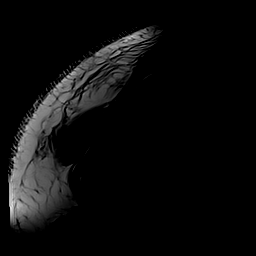
[im 6/33]
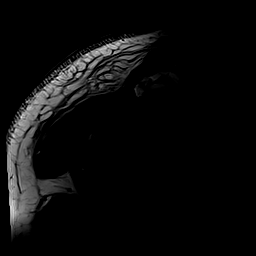
[im 11/33]
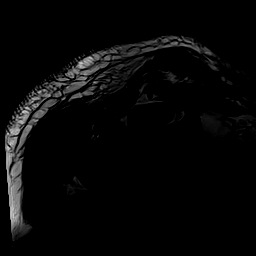
[im 17/33]
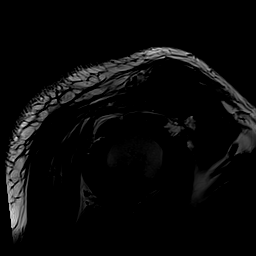
[im 22/33]
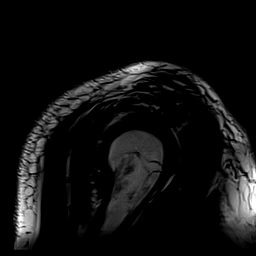
[im 27/33]
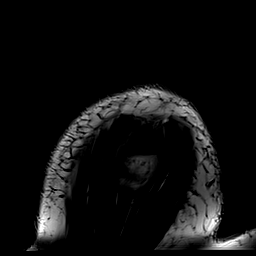
[im 33/33]
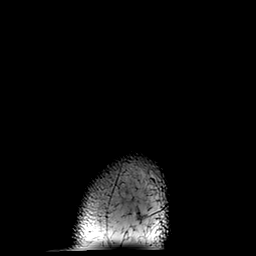

[Series 9: PD · oblique · right · 4.0mm · 0.83mm/px · 8 of 40 slices shown]
[im 1/40]
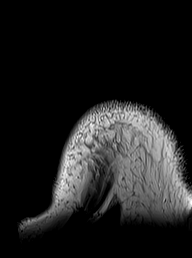
[im 6/40]
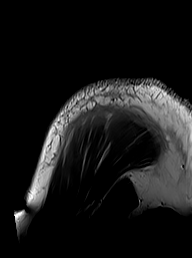
[im 12/40]
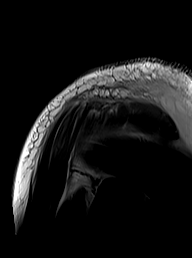
[im 17/40]
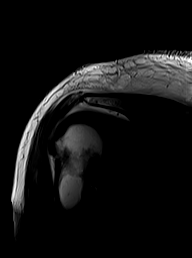
[im 23/40]
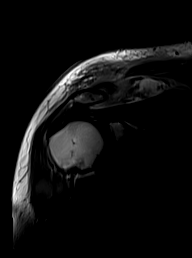
[im 28/40]
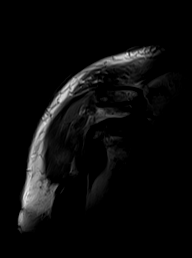
[im 34/40]
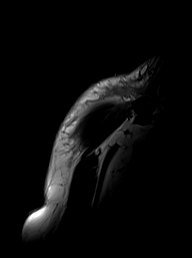
[im 40/40]
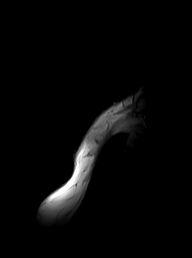

[Series 10: T2 fat-sat · oblique · right · 3.5mm · 0.71mm/px · 7 of 33 slices shown (3 of 3)]
[im 1/33]
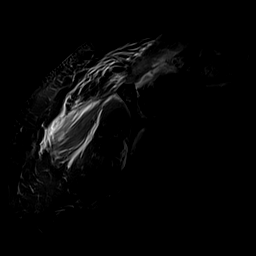
[im 6/33]
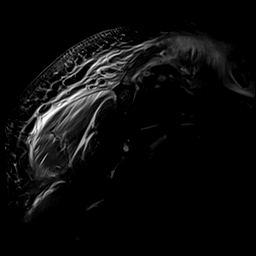
[im 11/33]
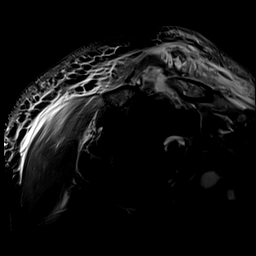
[im 17/33]
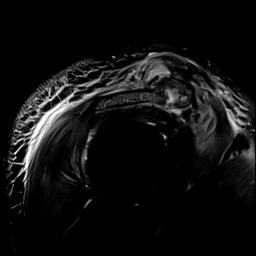
[im 22/33]
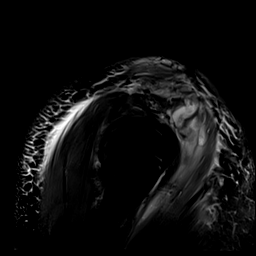
[im 27/33]
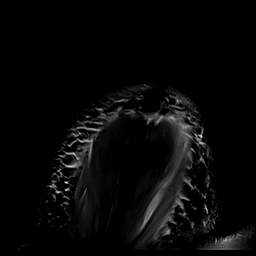
[im 33/33]
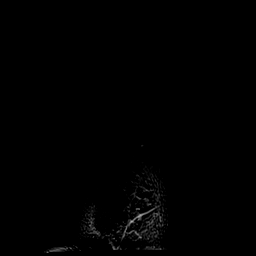

[36 of 40 positions shown; findings below may reference images not displayed]

FINDINGS: Rotator cuff: Mild tendinosis of the supraspinatus and infraspinatus
tendons. Possible tiny insertional tear of the posterior aspect of
the infraspinatus tendon. Mild subscapularis tendinosis with
low-grade insertional tear. Intact teres minor. No high-grade or
full-thickness rotator cuff tear.

Muscles: Mild subscapularis intramuscular edema. Preserved bulk of
the rotator cuff musculature without atrophy or fatty infiltration.

Biceps long head:  Intact.  No tenosynovitis.

Acromioclavicular Joint: Moderate-sized acromioclavicular joint
effusion. Marked bone marrow edema involving the acromion process
and distal clavicle. There is subchondral irregularity and low T1
signal intensity changes within the distal clavicle and acromion tip
suspicious for osteomyelitis (series 8, images 15-17). Marked
surrounding soft tissue edema. Complex subacromial-subdeltoid bursal
fluid collection (series 7, image 20).

Glenohumeral Joint: No glenohumeral joint effusion. No cartilage
defect.

Labrum:  Suboptimally evaluated.

Bones: No acute fracture or dislocation. Marrow edema and T1 signal
abnormality centered at the AC joint suspicious for osteomyelitis,
as above.

Other: Marked intramuscular edema within the anterior deltoid muscle
with complex multilobulated fluid collection suspicious for
intramuscular abscess. Although not well-defined. Collection
measures up to 4.7 x 1.7 cm trans axially along the anterior aspect
of the AC joint and extends inferiorly within the muscle at least 10
cm in length. Extensive intramuscular edema within the lateral
deltoid muscle without organized fluid collection. Multiple
prominent axillary lymph nodes. Prominent surrounding subcutaneous
edema.
IMPRESSION: 1. Findings most compatible with septic arthritis of the right
acromioclavicular joint with osteomyelitis of the distal clavicle
and acromion.
2. Marked intramuscular edema within the anterior deltoid muscle
with complex multilobulated fluid collection suspicious for
intramuscular abscess.
3. Complex subacromial-subdeltoid bursal fluid collection suspicious
for bursitis, which may be infectious.
4. No evidence to suggest glenohumeral joint septic arthritis.
5. Mild tendinosis of the supraspinatus and infraspinatus tendons.
Possible tiny insertional tear of the posterior aspect of the
infraspinatus tendon. Mild subscapularis tendinosis with low-grade
insertional tear. No high-grade or full-thickness rotator cuff tear.

These results will be called to the ordering clinician or
representative by the Radiologist Assistant, and communication
documented in the PACS or [REDACTED].

## 2020-04-23 MED ORDER — SODIUM CHLORIDE 0.9 % IV SOLN
INTRAVENOUS | Status: DC
Start: 2020-04-23 — End: 2020-04-23

## 2020-04-23 MED ORDER — ENOXAPARIN SODIUM 60 MG/0.6ML ~~LOC~~ SOLN
60.0000 mg | SUBCUTANEOUS | Status: DC
  Administered 2020-04-24: 60 mg via SUBCUTANEOUS
  Filled 2020-04-23: qty 0.6

## 2020-04-23 MED ORDER — INSULIN ASPART 100 UNIT/ML ~~LOC~~ SOLN
0.0000 [IU] | Freq: Three times a day (TID) | SUBCUTANEOUS | Status: DC
  Administered 2020-04-23 – 2020-04-24 (×2): 7 [IU] via SUBCUTANEOUS

## 2020-04-23 MED ORDER — NAFCILLIN SODIUM 2 G IJ SOLR
2.0000 g | INTRAMUSCULAR | Status: DC
  Filled 2020-04-23 (×2): qty 2000

## 2020-04-23 MED ORDER — POTASSIUM CHLORIDE 10 MEQ/100ML IV SOLN
10.0000 meq | INTRAVENOUS | Status: AC
  Administered 2020-04-23 (×3): 10 meq via INTRAVENOUS
  Filled 2020-04-23 (×3): qty 100

## 2020-04-23 MED ORDER — INSULIN ASPART 100 UNIT/ML ~~LOC~~ SOLN
0.0000 [IU] | Freq: Every day | SUBCUTANEOUS | Status: DC
  Administered 2020-04-23: 4 [IU] via SUBCUTANEOUS

## 2020-04-23 MED ORDER — HYDROCORTISONE NA SUCCINATE PF 100 MG IJ SOLR
100.0000 mg | Freq: Three times a day (TID) | INTRAMUSCULAR | Status: DC
  Filled 2020-04-23: qty 2

## 2020-04-23 MED ORDER — LACTATED RINGERS IV BOLUS
1000.0000 mL | Freq: Once | INTRAVENOUS | Status: AC
  Administered 2020-04-23: 1000 mL via INTRAVENOUS

## 2020-04-23 MED ORDER — NOREPINEPHRINE 4 MG/250ML-% IV SOLN
0.0000 ug/min | INTRAVENOUS | Status: DC
  Administered 2020-04-23: 2 ug/min via INTRAVENOUS
  Filled 2020-04-23: qty 250

## 2020-04-23 MED ORDER — INSULIN GLARGINE 100 UNIT/ML ~~LOC~~ SOLN
10.0000 [IU] | Freq: Every day | SUBCUTANEOUS | Status: DC
  Administered 2020-04-23: 10 [IU] via SUBCUTANEOUS
  Filled 2020-04-23 (×2): qty 0.1

## 2020-04-23 MED ORDER — SODIUM CHLORIDE 0.9 % IV SOLN
2.0000 g | INTRAVENOUS | Status: DC
  Administered 2020-04-23 – 2020-04-29 (×35): 2 g via INTRAVENOUS
  Filled 2020-04-23 (×20): qty 2000
  Filled 2020-04-23 (×2): qty 2
  Filled 2020-04-23 (×9): qty 2000
  Filled 2020-04-23: qty 2
  Filled 2020-04-23 (×3): qty 2000
  Filled 2020-04-23: qty 2
  Filled 2020-04-23 (×3): qty 2000
  Filled 2020-04-23: qty 2
  Filled 2020-04-23 (×2): qty 2000

## 2020-04-23 MED ORDER — NOREPINEPHRINE 4 MG/250ML-% IV SOLN
INTRAVENOUS | Status: AC
  Filled 2020-04-23: qty 250

## 2020-04-23 NOTE — Progress Notes (Signed)
Patient blood pressure sudden drop to 77/45. MD notified orders received for bolus LR. Minimal response to first bolus with BP in mid 80s, second LR bolus ordered by Karleen Hampshire MD and Levophed ordered per Carlis Abbott MD. BP stable at this time.

## 2020-04-23 NOTE — Progress Notes (Signed)
    [facility]   Reason for visit: Follow up on MSSA bacteremia  Interval History: patient currently in MRI, lactate elevated.  TTE without obvious vegetation.  Now under CCM care.   Physical Exam: Constitutional:  Vitals:   04/23/20 1307 04/23/20 1400  BP: (!) 83/54 106/61  Pulse: (!) 107 (!) 103  Resp: (!) 25 (!) 26  Temp:    SpO2: 95% 96%    Impression: MSSA bacteremeia with Septic shock.  Emboli to spleen, hand, foot.  On pressor support.  Has remained encephalopathic and getting MRI of brain now for ? Septic emboli.  TTE without vegetation noted.  Changed to nafcillin for MSSA coverage with CNS penetration.  MRI shoulder now to determine if acute source control indicated.    Plan: 1.  Continue nafcillin and supportive care.

## 2020-04-23 NOTE — Progress Notes (Signed)
PROGRESS NOTE    Jonathan Shaffer  MOQ:[id number] DOB: 1965/09/26 DOA: 04/15/2020 PCP: Patient, No Pcp Per   No chief complaint on file.   Brief Narrative:  54 year old gentleman with prior history of diabetes mellitus, urticaria presents with fever and left shoulder pain, confusion.  On arrival to ED he was febrile with a temp of 104, tachycardic with a heart rate of 130/min, tachypneic with respiratory rate of 40/min elevated lactic acid of 3.3, was found to be in DKA.  CT of the head is unremarkable and CT of the abdomen and pelvis did not show any intra-abdominal infection, showed multiple splenic infarcts. Of note patient is wife reports that he had shrimp cocktail about a week ago, started having nausea vomiting since Thursday and presented to ED yesterday evening.  Assessment & Plan:   Principal Problem:   Severe sepsis ([place]) Active Problems:   Splenic infarct   Diabetic acidosis without coma (HCC)   AKI (acute kidney injury) ([place])   Severe sepsis probably secondary to MSSA bacteremia with septic shock. On arrival patient was febrile, tachycardic with heart rate of 130/min, tachypneic respiratory rate of 40/min, elevated lactic acid AKI with creatinine of 1.5, currently hypotensive with blood pressure of 83/54. So far he has received 2 normal saline boluses and 1 Ringer lactate 1 L bolus, On maintenance fluids at 125 mL/h of Ringer lactate. Lactic acid normalized to 1.4. Hydrocortef ordered 100 mg q8 hrs.  Requested pulmonary/critical care consult for initiation of vasopressors. Patient started on broad-spectrum IV antibiotics, later on transitioned to nafcillin .blood cultures revealed MSSA. ID on board and appreciate recommendations. Doxycycline added for possible vibrio vulnificus infection.   Diabetic ketoacidosis As per the patient and wife he was borderline diabetic 2 years ago and did not follow-up with PCP due to Covid pandemic.  His A1c is currently around 11.2 and  on admission he was in DKA.  He was started on insulin gtt, as his gap closed and bicarb normalized, he was later on transitioned to Lantus and sliding scale.  Is currently on clear liquid diet. CBG (last 3)  Recent Labs    04/23/20 1003 04/23/20 1057 04/23/20 1155  GLUCAP 146* 170* 168*   Continue with sliding scale insulin.    Pseudohyponatremia probably secondary to hyperglycemia and dehydration Sodium has improved to 134.    AKI with mild metabolic acidosis,  Probably from poor renal perfusion from sepsis Creatinine improved from 1.59-1.34 Urine output adequate.     Hypokalemia Replaced, magnesium levels adequate.    Right shoulder joint erythema and tender Orthopedics on board and MRI of the right shoulder ordered for further evaluation.   Leukocytosis probably secondary to MSSA bacteremia Improving with IV antibiotics.   Acute metabolic encephalopathy probably secondary to DKA, AKI and sepsis Improving currently he is alert oriented to place and person. Initial CT of the head is negative for acute abnormalities. MRI of the brain without contrast for evaluation of septic emboli.    Septic emboli of the spleen CT abdomen pelvis showed  Multiple small infarcts within the upper pole of the spleen.   History of testicular cancer S/p right orchiectomy and retroperitoneal lymph node.     DVT prophylaxis: Lovenox Code Status: (Full code) Family Communication: (wife at bedside) Disposition:   Status is: Inpatient  Remains inpatient appropriate because:Ongoing diagnostic testing needed not appropriate for outpatient work up, Unsafe d/c plan, IV treatments appropriate due to intensity of illness or inability to take PO and Inpatient level  of care appropriate due to severity of illness   Dispo: The patient is from: Home              Anticipated d/c is to: Home              Anticipated d/c date is: 3 days              Patient currently is not medically  stable to d/c.       Consultants:   Orthopedics.   Infectious disease  PCCm.    Procedures:   Echocardiogram.   PICC line placement.   Venous duplex of the left upper extremity.   Antimicrobials:  Antibiotics Given (last 72 hours)    Date/Time Action Medication Dose Rate   04/11/2020 0209 New Bag/Given   ceFEPIme (MAXIPIME) 2 g in sodium chloride 0.9 % 100 mL IVPB 2 g 200 mL/hr   04/07/2020 0212 New Bag/Given   vancomycin (VANCOCIN) IVPB 1000 mg/200 mL premix 1,000 mg 200 mL/hr   04/12/2020 0252 New Bag/Given   piperacillin-tazobactam (ZOSYN) IVPB 3.375 g 3.375 g 12.5 mL/hr   04/07/2020 0315 New Bag/Given   vancomycin (VANCOCIN) IVPB 1000 mg/200 mL premix 1,000 mg 200 mL/hr   04/13/2020 1245 New Bag/Given   ceFEPIme (MAXIPIME) 2 g in sodium chloride 0.9 % 100 mL IVPB 2 g 200 mL/hr   05/01/2020 1623 New Bag/Given   vancomycin (VANCOCIN) IVPB 1000 mg/200 mL premix 1,000 mg 200 mL/hr   04/17/2020 1732 New Bag/Given   doxycycline (VIBRAMYCIN) 100 mg in sodium chloride 0.9 % 250 mL IVPB 100 mg 125 mL/hr   05/01/2020 1937 New Bag/Given   ceFEPIme (MAXIPIME) 2 g in sodium chloride 0.9 % 100 mL IVPB 2 g 200 mL/hr   04/23/20 0327 New Bag/Given   vancomycin (VANCOCIN) IVPB 1000 mg/200 mL premix 1,000 mg 200 mL/hr   04/23/20 0334 New Bag/Given   doxycycline (VIBRAMYCIN) 100 mg in sodium chloride 0.9 % 250 mL IVPB 100 mg 125 mL/hr   04/23/20 7829 New Bag/Given   ceFEPIme (MAXIPIME) 2 g in sodium chloride 0.9 % 100 mL IVPB 2 g 200 mL/hr      Subjective: Patient reports left shoulder pain, wants to know when he can eat  Objective: Vitals:   04/23/20 1200 04/23/20 1300 04/23/20 1306 04/23/20 1307  BP: 122/69 (!) 77/45 (!) 83/52 (!) 83/54  Pulse: (!) 110 (!) 110 (!) 109 (!) 107  Resp: (!) 29 (!) 25 (!) 26 (!) 25  Temp: (!) 100.4 F (38 C)     TempSrc: Oral     SpO2: 96% 95% 95% 95%  Weight:      Height:        Intake/Output Summary (Last 24 hours) at 04/23/2020 1354 Last data  filed at 04/23/2020 1157 Gross per 24 hour  Intake 6629.62 ml  Output 1975 ml  Net 4654.62 ml   Filed Weights   04/10/2020 0124 04/25/2020 0635  Weight: 117 kg 126.8 kg    Examination:  General exam: Well-developed gentleman, not in any kind of distress at this time. Respiratory system: Clear to auscultation. Respiratory effort normal. Cardiovascular system: S1 & S2 heard, RRR.  No JVD, trace pedal edema Gastrointestinal system: Abdomen is nondistended, soft and nontender. Normal bowel sounds heard. Central nervous system: Lethargic but was able to wake up and answer simple questions.  Alert and oriented to place and person and able to move all extremities. Extremities: Left upper extremity swelling and painful range of movements at  the left shoulder joints. Skin: No rashes, lesions or ulcers Psychiatry: Mood is appropriate   Data Reviewed: I have personally reviewed following labs and imaging studies  CBC: Recent Labs  Lab 04/25/2020 0202 04/25/2020 0204 04/23/20 0537  WBC 21.3*  --  16.3*  NEUTROABS 19.5*  --   --   HGB 14.0 15.6 11.0*  HCT 41.6 46.0 32.8*  MCV 89.1  --  89.4  PLT 191  --  517    Basic Metabolic Panel: Recent Labs  Lab 04/26/2020 1353 04/16/2020 1717 04/23/2020 2200 04/23/20 0130 04/23/20 0537  NA 128* 127* 131* 132* 134*  K 3.6 2.9* 3.3* 3.3* 2.8*  CL 95* 98 102 103 103  CO2 21* 20* 20* 20* 22  GLUCOSE 264* 287* 198* 198* 173*  BUN 26* 24* 23* 25* 25*  CREATININE 1.48* 1.41* 1.33* 1.41* 1.34*  CALCIUM 8.0* 7.4* 7.5* 7.3* 7.4*  MG  --   --   --   --  2.0    GFR: Estimated Creatinine Clearance: 90.4 mL/min (A) (by C-G formula based on SCr of 1.34 mg/dL (H)).  Liver Function Tests: Recent Labs  Lab 04/21/2020 0202 04/23/20 0537  AST 40 41  ALT 48* 32  ALKPHOS 133* 75  BILITOT 2.0* 1.5*  PROT 6.4* 4.9*  ALBUMIN 2.4* 1.7*    CBG: Recent Labs  Lab 04/23/20 0750 04/23/20 0847 04/23/20 1003 04/23/20 1057 04/23/20 1155  GLUCAP 139* 136*  146* 170* 168*     Recent Results (from the past 240 hour(s))  Blood Culture (routine x 2)     Status: Abnormal (Preliminary result)   Collection Time: 04/18/2020  2:00 AM   Specimen: BLOOD RIGHT HAND  Result Value Ref Range Status   Specimen Description   Final    BLOOD RIGHT HAND Performed at [facility], [address] [place], [place] [address]    Special Requests   Final    BOTTLES DRAWN AEROBIC AND ANAEROBIC Blood Culture adequate volume Performed at [facility], [address] [place], [place] [address]    Culture  Setup Time   Final    GRAM POSITIVE COCCI IN BOTH AEROBIC AND ANAEROBIC BOTTLES CRITICAL RESULT CALLED TO, READ BACK BY AND VERIFIED WITHSeward Meth [facility] 2109 04/28/2020 A BROWNING Performed at [facility], [place] [address], [place], [place] [address]    Culture STAPHYLOCOCCUS AUREUS (A)  Final   Report Status PENDING  Incomplete  Blood Culture ID Panel (Reflexed)     Status: Abnormal   Collection Time: 04/25/2020  2:00 AM  Result Value Ref Range Status   Enterococcus faecalis NOT DETECTED NOT DETECTED Final   Enterococcus Faecium NOT DETECTED NOT DETECTED Final   Listeria monocytogenes NOT DETECTED NOT DETECTED Final   Staphylococcus species DETECTED (A) NOT DETECTED Final    Comment: CRITICAL RESULT CALLED TO, READ BACK BY AND VERIFIED WITH: N GOLGOVAC PHARMD 2109 04/15/2020 A BROWNING    Staphylococcus aureus (BCID) DETECTED (A) NOT DETECTED Final    Comment: CRITICAL RESULT CALLED TO, READ BACK BY AND VERIFIED WITH: N GOLGOVAC PHARMD 2109 04/24/2020 A BROWNING    Staphylococcus epidermidis NOT DETECTED NOT DETECTED Final   Staphylococcus lugdunensis NOT DETECTED NOT DETECTED Final   Streptococcus species NOT DETECTED NOT DETECTED Final   Streptococcus agalactiae NOT DETECTED NOT DETECTED Final   Streptococcus pneumoniae NOT DETECTED NOT DETECTED Final   Streptococcus pyogenes NOT DETECTED NOT DETECTED Final    A.calcoaceticus-baumannii  NOT DETECTED NOT DETECTED Final   Bacteroides fragilis NOT DETECTED NOT DETECTED Final   Enterobacterales NOT DETECTED NOT DETECTED Final   Enterobacter cloacae complex NOT DETECTED NOT DETECTED Final   Escherichia coli NOT DETECTED NOT DETECTED Final   Klebsiella aerogenes NOT DETECTED NOT DETECTED Final   Klebsiella oxytoca NOT DETECTED NOT DETECTED Final   Klebsiella pneumoniae NOT DETECTED NOT DETECTED Final   Proteus species NOT DETECTED NOT DETECTED Final   Salmonella species NOT DETECTED NOT DETECTED Final   Serratia marcescens NOT DETECTED NOT DETECTED Final   Haemophilus influenzae NOT DETECTED NOT DETECTED Final   Neisseria meningitidis NOT DETECTED NOT DETECTED Final   Pseudomonas aeruginosa NOT DETECTED NOT DETECTED Final   Stenotrophomonas maltophilia NOT DETECTED NOT DETECTED Final   Candida albicans NOT DETECTED NOT DETECTED Final   Candida auris NOT DETECTED NOT DETECTED Final   Candida glabrata NOT DETECTED NOT DETECTED Final   Candida krusei NOT DETECTED NOT DETECTED Final   Candida parapsilosis NOT DETECTED NOT DETECTED Final   Candida tropicalis NOT DETECTED NOT DETECTED Final   Cryptococcus neoformans/gattii NOT DETECTED NOT DETECTED Final   Meth resistant mecA/C and MREJ NOT DETECTED NOT DETECTED Final    Comment: Performed at [facility], [address], [place], [place] [address]  Urine culture     Status: Abnormal (Preliminary result)   Collection Time: 04/25/2020  2:02 AM   Specimen: In/Out Cath Urine  Result Value Ref Range Status   Specimen Description   Final    IN/OUT CATH URINE Performed at [facility], [address] [place],  [address]    Special Requests   Final    NONE Performed at [facility], [address] [place], [place] [address]    Culture (A)  Final    40,000 COLONIES/mL STAPHYLOCOCCUS AUREUS SUSCEPTIBILITIES TO FOLLOW Performed at [facility], [place] [address]  [place],  [address]    Report Status PENDING  Incomplete  Resp Panel by RT-PCR (Flu A&B, Covid) Nasopharyngeal Swab     Status: None   Collection Time: 04/20/2020  2:05 AM   Specimen: Nasopharyngeal Swab; Nasopharyngeal(NP) swabs in vial transport medium  Result Value Ref Range Status   SARS Coronavirus 2 by RT PCR NEGATIVE NEGATIVE Final    Comment: (NOTE) SARS-CoV-2 target nucleic acids are NOT DETECTED.  The SARS-CoV-2 RNA is generally detectable in upper respiratory specimens during the acute phase of infection. The lowest concentration of SARS-CoV-2 viral copies this assay can detect is 138 copies/mL. A negative result does not preclude SARS-Cov-2 infection and should not be used as the sole basis for treatment or other patient management decisions. A negative result may occur with  improper specimen collection/handling, submission of specimen other than nasopharyngeal swab, presence of viral mutation(s) within the areas targeted by this assay, and inadequate number of viral copies(<138 copies/mL). A negative result must be combined with clinical observations, patient history, and epidemiological information. The expected result is Negative.  Fact Sheet for Patients:  EntrepreneurPulse.com.au  Fact Sheet for Healthcare Providers:  IncredibleEmployment.be  This test is no t yet approved or cleared by the [place] FDA and  has been authorized for detection and/or diagnosis of SARS-CoV-2 by FDA under an Emergency Use Authorization (EUA). This EUA will remain  in effect (meaning this test can be used) for the duration of the COVID-19 declaration under Section 564(b)(1) of the Act, 21 U.S.C.section 360bbb-3(b)(1), unless the authorization  is terminated  or revoked sooner.       Influenza A by PCR NEGATIVE NEGATIVE Final   Influenza B by PCR NEGATIVE NEGATIVE Final    Comment: (NOTE) The Xpert Xpress SARS-CoV-2/FLU/RSV plus assay is  intended as an aid in the diagnosis of influenza from Nasopharyngeal swab specimens and should not be used as a sole basis for treatment. Nasal washings and aspirates are unacceptable for Xpert Xpress SARS-CoV-2/FLU/RSV testing.  Fact Sheet for Patients: EntrepreneurPulse.com.au  Fact Sheet for Healthcare Providers: IncredibleEmployment.be  This test is not yet approved or cleared by the [place] FDA and has been authorized for detection and/or diagnosis of SARS-CoV-2 by FDA under an Emergency Use Authorization (EUA). This EUA will remain in effect (meaning this test can be used) for the duration of the COVID-19 declaration under Section 564(b)(1) of the Act, 21 U.S.C. section 360bbb-3(b)(1), unless the authorization is terminated or revoked.  Performed at [facility], [address] [place], [place] [address]   Blood Culture (routine x 2)     Status: Abnormal (Preliminary result)   Collection Time: 04/28/2020  2:06 AM   Specimen: BLOOD LEFT FOREARM  Result Value Ref Range Status   Specimen Description   Final    BLOOD LEFT FOREARM Performed at [facility], [address] [place], [place] [address]    Special Requests   Final    BOTTLES DRAWN AEROBIC AND ANAEROBIC Blood Culture adequate volume Performed at [facility], [address] [place], [place] [address]    Culture  Setup Time   Final    GRAM POSITIVE COCCI IN BOTH AEROBIC AND ANAEROBIC BOTTLES CRITICAL VALUE NOTED.  VALUE IS CONSISTENT WITH PREVIOUSLY REPORTED AND CALLED VALUE. Performed at [facility], [place] [address] [place], [place] [address]    Culture STAPHYLOCOCCUS AUREUS (A)  Final   Report Status PENDING  Incomplete  MRSA PCR Screening     Status: None   Collection Time: 04/16/2020  6:24 AM   Specimen: Nasopharyngeal  Result Value Ref Range Status   MRSA by PCR NEGATIVE NEGATIVE Final    Comment:        The GeneXpert MRSA  Assay (FDA approved for NASAL specimens only), is one component of a comprehensive MRSA colonization surveillance program. It is not intended to diagnose MRSA infection nor to guide or monitor treatment for MRSA infections. Performed at [facility], [place] [address] [place], [place] [address]   Culture, blood (single)     Status: None (Preliminary result)   Collection Time: 04/25/2020  1:53 PM   Specimen: BLOOD RIGHT HAND  Result Value Ref Range Status   Specimen Description   Final    BLOOD RIGHT HAND Performed at [facility] [address] [place], [place] [address]    Special Requests   Final    BOTTLES DRAWN AEROBIC AND ANAEROBIC Blood Culture results may not be optimal due to an inadequate volume of blood received in culture bottles Performed at [place] [address], [place], [place] [address]    Culture   Final    NO GROWTH < 24 HOURS Performed at [place] [address] [place], [place] [address]    Report Status PENDING  Incomplete         Radiology Studies: CT Head Wo Contrast  Result Date: 04/08/2020 CLINICAL DATA:  Delirium EXAM: CT HEAD WITHOUT CONTRAST TECHNIQUE:  Contiguous axial images were obtained from the base of the skull through the vertex without intravenous contrast. COMPARISON:  None. FINDINGS: Brain: Normal anatomic configuration. No abnormal intra or extra-axial mass lesion or fluid collection. No abnormal mass effect or midline shift. No evidence of acute intracranial hemorrhage or infarct. Ventricular size is normal. Cerebellum unremarkable. Vascular: Unremarkable Skull: Intact Sinuses/Orbits: Paranasal sinuses are clear. Orbits are unremarkable. Other: Mastoid air cells and middle ear cavities are clear. IMPRESSION: No acute intracranial abnormality. Electronically Signed   By: Fidela Salisbury MD   On: 05/03/2020 04:18   CT ABDOMEN PELVIS W CONTRAST  Result Date:  04/28/2020 CLINICAL DATA:  Abdominal pain, fever, vomiting EXAM: CT ABDOMEN AND PELVIS WITH CONTRAST TECHNIQUE: Multidetector CT imaging of the abdomen and pelvis was performed using the standard protocol following bolus administration of intravenous contrast. CONTRAST:  168mL OMNIPAQUE IOHEXOL 300 MG/ML  SOLN COMPARISON:  None. FINDINGS: Lower chest: The visualized lung bases are clear bilaterally. The visualized heart and pericardium are unremarkable. Hepatobiliary: No focal liver abnormality is seen. No gallstones, gallbladder wall thickening, or biliary dilatation. Pancreas: Unremarkable Spleen: There are multiple wedge-shaped areas of hypoattenuation within the upper pole of the spleen likely representing multiple small splenic infarcts. The spleen is of normal size. Splenic artery and vein are patent. Adrenals/Urinary Tract: Adrenal glands are unremarkable. Kidneys are normal, without renal calculi, focal lesion, or hydronephrosis. Bladder is unremarkable. Stomach/Bowel: The stomach, small bowel, and large bowel are unremarkable. The appendix is normal. No free intraperitoneal gas or fluid. Vascular/Lymphatic: The abdominal vasculature is unremarkable. Numerous surgical clips are seen within the retroperitoneum in keeping with retroperitoneal lymph node dissection. The right spermatic cord is absent in keeping with orchiectomy. No pathologic adenopathy within the abdomen and pelvis. Reproductive: Prostate is unremarkable. Other: Rectum unremarkable.  Tiny fat containing umbilical hernia. Musculoskeletal: No acute bone abnormality. 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 Electronically Signed   By: Fidela Salisbury MD   On: 04/05/2020 04:27   DG Chest Port 1 View  Result Date: 04/30/2020 CLINICAL DATA:  Sepsis EXAM: PORTABLE CHEST 1 VIEW COMPARISON:   None. FINDINGS: Lungs volumes are small, and there is mild left basilar atelectasis. No pneumothorax or pleural effusion. Cardiac size within normal limits. Pulmonary vascularity is normal. Osseous structures are age-appropriate. No acute bone abnormality. IMPRESSION: No active disease. Electronically Signed   By: Fidela Salisbury MD   On: 04/20/2020 02:55   ECHOCARDIOGRAM LIMITED  Result Date: 04/17/2020    ECHOCARDIOGRAM LIMITED REPORT   Patient Name:   Jonathan Shaffer Date of Exam: 04/29/2020 Medical Rec #:  [id number]      Height:       75.0 in Accession #:    [id number]     Weight:       279.5 lb Date of Birth:  [birth year]     BSA:          2.530 m Patient Age:    86 years       BP:           163/86 mmHg Patient Gender: M              HR:           130 bpm. Exam Location:  Inpatient Procedure: Limited Echo and Limited Color Doppler Indications:    Fever R50.9  History:  Patient has no prior history of Echocardiogram examinations.                 Risk Factors:Diabetes.  Sonographer:    Mikki Santee RDCS (AE) Referring Phys: [id number] CHRISTOPHER P DANFORD IMPRESSIONS  1. Left ventricular ejection fraction, by estimation, is 55 to 60%. The left ventricle has normal function. The left ventricle has no regional wall motion abnormalities. Left ventricular diastolic function could not be evaluated.  2. The mitral valve is grossly normal. No evidence of mitral valve regurgitation. No evidence of mitral stenosis.  3. The aortic valve is tricuspid. Aortic valve regurgitation is not visualized. No aortic stenosis is present. FINDINGS  Left Ventricle: Left ventricular ejection fraction, by estimation, is 55 to 60%. The left ventricle has normal function. The left ventricle has no regional wall motion abnormalities. The left ventricular internal cavity size was normal in size. There is  no left ventricular hypertrophy. Left ventricular diastolic function could not be evaluated. Pericardium: Trivial pericardial  effusion is present. Mitral Valve: The mitral valve is grossly normal. No evidence of mitral valve stenosis. Tricuspid Valve: The tricuspid valve is grossly normal. Tricuspid valve regurgitation is not demonstrated. No evidence of tricuspid stenosis. Aortic Valve: The aortic valve is tricuspid. Aortic valve regurgitation is not visualized. No aortic stenosis is present. Pulmonic Valve: The pulmonic valve was not assessed. Eleonore Chiquito MD Electronically signed by Eleonore Chiquito MD Signature Date/Time: 04/15/2020/1:36:17 PM    Final    [place] EKG SITE RITE  Result Date: 04/14/2020 If Site Rite image not attached, placement could not be confirmed due to current cardiac rhythm.       Scheduled Meds: . chlorhexidine  15 mL Mouth Rinse BID  . Chlorhexidine Gluconate Cloth  6 each Topical Daily  . [START ON 04/24/2020] enoxaparin (LOVENOX) injection  60 mg Subcutaneous Q24H  . hydrocortisone sod succinate (SOLU-CORTEF) inj  100 mg Intravenous Q8H  . insulin aspart  0-5 Units Subcutaneous QHS  . insulin aspart  0-9 Units Subcutaneous TID WC  . insulin glargine  10 Units Subcutaneous Daily  . mouth rinse  15 mL Mouth Rinse q12n4p  . sodium chloride flush  10-40 mL Intracatheter Q12H   Continuous Infusions: . lactated ringers    . lactated ringers Stopped (04/27/2020 0955)  . nafcillin IV       LOS: 1 day        Hosie Poisson, MD [facility]   To contact the attending provider between 7A-7P or the covering provider during after hours 7P-7A, please log into the web site [email or web address] and access using universal [place] password for that web site. If you do not have the password, please call the hospital operator.  04/23/2020, 1:54 PM

## 2020-04-23 NOTE — Progress Notes (Signed)
Upper extremity venous has been completed.   Preliminary results in CV Proc.   Abram Sander 04/23/2020 2:05 PM

## 2020-04-23 NOTE — Consult Note (Signed)
NAME:  Jonathan Shaffer, MRN:  [id number], DOB:  [birth year], LOS: 1 ADMISSION DATE:  04/04/2020, CONSULTATION DATE:  04/23/20 REFERRING MD:  Karleen Hampshire, CHIEF COMPLAINT:  Septic shock  Brief History   MSSA bacteremia +/- AC septic arthritis, septic emboli to spleen  History of present illness   Jonathan Shaffer is a 54 y/o gentleman admitted with sepsis, found to have MSSA bacteremia. He developed sudden onset nausea and vomiting and chills last week and later developed fevers and R shoulder pain. He was evaluated and felt to have a sprain. He was treated with an OP steroid injection and started oral steroids. He developed a painful rash on his hand. His progressive malaise, fevers, rash, and joint pain prompted return to the ED. At presentation he was febrile, tachycardic, tachypneic, with lactic acidosis. He was in DKA at presentation. He has a previous history of "high diabetes markers", which he reports he was not following up on. He was started on empiric vanc, zosyn & cefepime, and doxycycline. He has been evaluated by orthopaedics for possible septic arthritis of his R AC joint. ID has been consulted for MSSA+ blood cultures. No known history of IVDU or injuries prior to becoming ill. He has been encephalopathic this admission, prompting concern for septic emboli to the brain; MRI pending.  Past Medical History  Uncontrolled DM Testicular cancer; in remission HLD  Significant Hospital Events   Started vasopressors 11/20  Consults:  ID Ortho PCCM  Procedures:    Significant Diagnostic Tests:  MRI R shoulder> Echocardiogram> LUE US>  Micro Data:  Blood culture 11/19> MSSA  3/3 Urine culture 11/19> staph aureus  Antimicrobials:  Vanc 11/20 Zosyn 11/19 Cefepime 11/19- 11/20 Doxycycline 11/19 Nafcillin 11/20>  Interim history/subjective:    Objective   Blood pressure (!) 83/54, pulse (!) 107, temperature (!) 100.4 F (38 C), temperature source Oral, resp. rate (!) 25, height 6'  3" (1.905 m), weight 126.8 kg, SpO2 95 %.        Intake/Output Summary (Last 24 hours) at 04/23/2020 1445 Last data filed at 04/23/2020 1157 Gross per 24 hour  Intake 6629.62 ml  Output 1975 ml  Net 4654.62 ml   Filed Weights   04/05/2020 0124 04/06/2020 0635  Weight: 117 kg 126.8 kg    Examination: General: middle aged man laying in bed watching TV in NAD HENT: [place]/AT, eyes anicteric Lungs: CTAB, tachypneic but no accessory muscle use. Breathing comfortably on RA, no conversational dyspnea. Cardiovascular: tachycardic, reg rhythm, distant heart sounds Abdomen: soft, NT Extremities: erythematous and edematous LUE, warm and red R shoulder at [facility] joint Neuro: awake, watching TV, answering questions appropriately, moving all extremities spontaneously. Derm: Splinter hemorrhages on R distal toes, medial R first MTP joint.  Resolved Hospital Problem list     Assessment & Plan:  Septic shock due to MSSA bacteremia; concerned for [facility] septic arthritis as an uncontrolled source. Echocardiogram pending, but concern for endocarditis with splenic hemorrhages and janeway lesions. -Appreciate ID's assistance; con't nafcillin. -repeat blood cultures tomorrow -PICC will need to be removed when able to come off vasopressors; will need a new one placed after short line holiday if continuing nafcillin. 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 A1c  11.3 -basal bolus insulin -SSI PRN -goal BG 140-180 while critically ill -needs OP DM education and much more strict control  Acute anemia; suspect this is mixed due to critical illness, dilution -con't to monitor -transfuse for Hb <7 or hemodynamically significant bleeding  Hyperbilirubinemia likely 2/2 sepsis -con't to monitor -if Hb dropping, will check hemolysis and DIC labs  Best practice:  Diet: clears Pain/Anxiety/Delirium protocol (if indicated): fentanyl PRN VAP protocol (if indicated): n/a DVT prophylaxis: lovenox GI prophylaxis: n/a Glucose control: basal bolus + SSI Mobility: progressive Code Status: full Family Communication: wife updated at bedside Disposition: ICU  Labs   CBC: Recent Labs  Lab 04/18/2020 0202 04/30/2020 0204 04/23/20 0537  WBC 21.3*  --  16.3*  NEUTROABS 19.5*  --   --   HGB 14.0 15.6 11.0*  HCT 41.6 46.0 32.8*  MCV 89.1  --  89.4  PLT 191  --  811    Basic Metabolic Panel: Recent Labs  Lab 04/08/2020 1353 05/01/2020 1717 05/02/2020 2200 04/23/20 0130 04/23/20 0537  NA 128* 127* 131* 132* 134*  K 3.6 2.9* 3.3* 3.3* 2.8*  CL 95* 98 102 103 103  CO2 21* 20* 20* 20* 22  GLUCOSE 264* 287* 198* 198* 173*  BUN 26* 24* 23* 25* 25*  CREATININE 1.48* 1.41* 1.33* 1.41* 1.34*  CALCIUM 8.0* 7.4* 7.5* 7.3* 7.4*  MG  --   --   --   --  2.0   GFR: Estimated Creatinine Clearance: 90.4 mL/min (A) (by C-G formula based on SCr of 1.34 mg/dL (H)). Recent Labs  Lab 04/14/2020 0202 04/27/2020 0405 04/21/2020 1017 04/09/2020 1353 04/05/2020 1717 04/19/2020 2200 04/23/20 0537  WBC 21.3*  --   --   --   --   --  16.3*  LATICACIDVEN 3.3*   < > 3.0* 3.2* 1.7 1.4  --    < > = values in this interval not displayed.    Liver Function Tests: Recent Labs  Lab 04/28/2020 0202 04/23/20 0537  AST 40 41  ALT 48* 32  ALKPHOS 133* 75  BILITOT 2.0* 1.5*  PROT 6.4* 4.9*  ALBUMIN 2.4* 1.7*   Recent Labs  Lab 04/09/2020 0202  LIPASE 35   Recent Labs  Lab  04/04/2020 0202  AMMONIA 23    ABG    Component Value Date/Time   HCO3 18.0 (L) 04/16/2020 0204   TCO2 19 (L) 04/29/2020 0204   ACIDBASEDEF 7.0 (H) 04/09/2020 0204   O2SAT 50.0 04/12/2020 0204     Coagulation Profile: Recent Labs  Lab 04/21/2020 0202  INR 1.3*    Cardiac Enzymes: Recent Labs  Lab 05/02/2020 0202  CKTOTAL 70    HbA1C: Hgb A1c MFr Bld  Date/Time Value Ref Range Status  04/15/2020 10:17 AM 11.3 (H) 4.8 - 5.6 % Final    Comment:    (NOTE) Pre diabetes:          5.7%-6.4%  Diabetes:              >6.4%  Glycemic control for   <7.0% adults with diabetes     CBG: Recent Labs  Lab 04/23/20 0750 04/23/20 0847 04/23/20 1003 04/23/20 1057 04/23/20 1155  GLUCAP 139* 136* 146* 170* 168*    Review of Systems:   Review of Systems  Constitutional: Positive for chills, fever, malaise/fatigue and weight loss.  HENT: Negative.   Respiratory: Negative for cough and shortness of breath.   Cardiovascular: Positive for leg swelling. Negative for chest pain.  Gastrointestinal: Positive for nausea and vomiting.  Musculoskeletal: Positive for joint pain.  Skin: Positive for rash.     Past Medical History  He,  has a past medical history of Cancer ([place]), Diabetes mellitus without complication ([place]), High cholesterol, Testicle cancer ([place]), Thyroid disease, and Urticaria.   Surgical History    Past Surgical History:  Procedure Laterality Date  . ORCHIECTOMY       Social History   reports that he has never smoked. He has never used smokeless tobacco. He reports current alcohol use. He reports that he does not use drugs.   Family History   His family history includes Diabetes in his father; Heart disease in his father; Hyperlipidemia in his father; Hypertension in his father.   Allergies No Known Allergies   Home Medications  Prior to Admission medications   Medication Sig Start Date End Date Taking? Authorizing Provider  HYDROcodone-acetaminophen  (NORCO/VICODIN) 5-325 MG tablet Take 1 tablet by mouth every 6 (six) hours as needed for moderate pain.   Yes [provider]  lidocaine (LIDODERM) 5 % Place 1 patch onto the skin daily. Remove & Discard patch within 12 hours or as directed by MD 04/17/20  Yes Tacy Learn, PA-C  meloxicam (MOBIC) 7.5 MG tablet Take 7.5 mg by mouth daily.   Yes [provider]  predniSONE (DELTASONE) 10 MG tablet Take as directed for 12 days.  Daily dose 6,6,5,5,4,4,3,3,2,2,1,1. Patient taking differently: Take 10-60 mg by mouth See admin instructions. Take as directed for 12 days.  Daily dose 6,6,5,5,4,4,3,3,2,2,1,1. 04/21/20  Yes Hilts, Legrand Como, MD  traMADol (ULTRAM) 50 MG tablet Take 1-2 tablets (50-100 mg total) by mouth every 6 (six) hours as needed for moderate pain. 04/19/20  Yes Hilts, Legrand Como, MD      This patient is critically ill with multiple organ system failure which requires frequent high complexity decision making, assessment, support, evaluation, and titration of therapies. This was completed through the application of advanced monitoring technologies and extensive interpretation of multiple databases. During this encounter critical care time was devoted to patient care services described in this note for 46 minutes.  Julian Hy, DO 04/23/20 3:34 PM [facility]

## 2020-04-23 NOTE — Progress Notes (Signed)
Blood cultures now growing MSSA.  He remains confused and MRI ordered.  I will change him to nafcillin for CNS penetration with concern for septic emboli.   Thayer Headings, MD

## 2020-04-23 NOTE — Plan of Care (Signed)
  Problem: Fluid Volume: Goal: Ability to achieve a balanced intake and output will improve Outcome: Progressing   Problem: Metabolic: Goal: Ability to maintain appropriate glucose levels will improve Outcome: Progressing   

## 2020-04-23 NOTE — Plan of Care (Signed)
MRI shows AC joint septic arthritis, OM of the distal clavicle, and possible intramuscular abscess. [facility] on call paged. Patient made NPO.  Julian Hy, DO 04/23/20 4:53 PM [facility]

## 2020-04-23 NOTE — Progress Notes (Signed)
CRITICAL VALUE ALERT  Critical Value:  Lactic Acid 9.0  Date & Time Notied:  04/23/2020 1445  Provider Notified: Carlis Abbott MD  Orders Received/Actions taken: [facility]

## 2020-04-24 ENCOUNTER — Inpatient Hospital Stay (HOSPITAL_COMMUNITY): Payer: BC Managed Care – PPO

## 2020-04-24 DIAGNOSIS — M00011 Staphylococcal arthritis, right shoulder: Secondary | ICD-10-CM | POA: Diagnosis not present

## 2020-04-24 DIAGNOSIS — M009 Pyogenic arthritis, unspecified: Secondary | ICD-10-CM

## 2020-04-24 DIAGNOSIS — I748 Embolism and thrombosis of other arteries: Secondary | ICD-10-CM | POA: Diagnosis not present

## 2020-04-24 DIAGNOSIS — R410 Disorientation, unspecified: Secondary | ICD-10-CM | POA: Diagnosis not present

## 2020-04-24 DIAGNOSIS — A4101 Sepsis due to Methicillin susceptible Staphylococcus aureus: Principal | ICD-10-CM

## 2020-04-24 DIAGNOSIS — L02413 Cutaneous abscess of right upper limb: Secondary | ICD-10-CM | POA: Diagnosis not present

## 2020-04-24 DIAGNOSIS — I76 Septic arterial embolism: Secondary | ICD-10-CM | POA: Diagnosis not present

## 2020-04-24 DIAGNOSIS — B9561 Methicillin susceptible Staphylococcus aureus infection as the cause of diseases classified elsewhere: Secondary | ICD-10-CM | POA: Diagnosis not present

## 2020-04-24 DIAGNOSIS — A419 Sepsis, unspecified organism: Secondary | ICD-10-CM | POA: Diagnosis not present

## 2020-04-24 DIAGNOSIS — I742 Embolism and thrombosis of arteries of the upper extremities: Secondary | ICD-10-CM

## 2020-04-24 DIAGNOSIS — R7881 Bacteremia: Secondary | ICD-10-CM | POA: Diagnosis not present

## 2020-04-24 LAB — BASIC METABOLIC PANEL
Anion gap: 11 (ref 5–15)
Anion gap: 11 (ref 5–15)
BUN: 25 mg/dL — ABNORMAL HIGH (ref 6–20)
BUN: 27 mg/dL — ABNORMAL HIGH (ref 6–20)
CO2: 19 mmol/L — ABNORMAL LOW (ref 22–32)
CO2: 20 mmol/L — ABNORMAL LOW (ref 22–32)
Calcium: 7.1 mg/dL — ABNORMAL LOW (ref 8.9–10.3)
Calcium: 7.2 mg/dL — ABNORMAL LOW (ref 8.9–10.3)
Chloride: 100 mmol/L (ref 98–111)
Chloride: 98 mmol/L (ref 98–111)
Creatinine, Ser: 1.44 mg/dL — ABNORMAL HIGH (ref 0.61–1.24)
Creatinine, Ser: 1.4 mg/dL — ABNORMAL HIGH (ref 0.61–1.24)
GFR, Estimated: 58 mL/min — ABNORMAL LOW (ref >60)
GFR, Estimated: 60 mL/min — ABNORMAL LOW (ref >60)
Glucose, Bld: 333 mg/dL — ABNORMAL HIGH (ref 70–99)
Glucose, Bld: 320 mg/dL — ABNORMAL HIGH (ref 70–99)
Potassium: 3.4 mmol/L — ABNORMAL LOW (ref 3.5–5.1)
Potassium: 3.7 mmol/L (ref 3.5–5.1)
Sodium: 130 mmol/L — ABNORMAL LOW (ref 135–145)
Sodium: 129 mmol/L — ABNORMAL LOW (ref 135–145)

## 2020-04-24 LAB — CBC
HCT: 32.1 % — ABNORMAL LOW (ref 39.0–52.0)
Hemoglobin: 10.8 g/dL — ABNORMAL LOW (ref 13.0–17.0)
MCH: 30.1 pg (ref 26.0–34.0)
MCHC: 33.6 g/dL (ref 30.0–36.0)
MCV: 89.4 fL (ref 80.0–100.0)
Platelets: 181 10*3/uL (ref 150–400)
RBC: 3.59 MIL/uL — ABNORMAL LOW (ref 4.22–5.81)
RDW: 14.5 % (ref 11.5–15.5)
WBC: 14.8 10*3/uL — ABNORMAL HIGH (ref 4.0–10.5)
nRBC: 0 % (ref 0.0–0.2)

## 2020-04-24 LAB — GLUCOSE, CAPILLARY
Glucose-Capillary: 311 mg/dL — ABNORMAL HIGH (ref 70–99)
Glucose-Capillary: 301 mg/dL — ABNORMAL HIGH (ref 70–99)
Glucose-Capillary: 249 mg/dL — ABNORMAL HIGH (ref 70–99)

## 2020-04-24 LAB — CULTURE, BLOOD (ROUTINE X 2)
Special Requests: ADEQUATE
Special Requests: ADEQUATE

## 2020-04-24 LAB — URINE CULTURE: Culture: 40000 — AB

## 2020-04-24 IMAGING — DX DG WRIST COMPLETE 3+V*L*
4 series · 4 of 4 positions shown · non-contrast
Comparison: None.

CLINICAL DATA: Left wrist pain.

EXAM:
LEFT WRIST - COMPLETE 3+ VIEW

[wrist ap (1 of 2)]
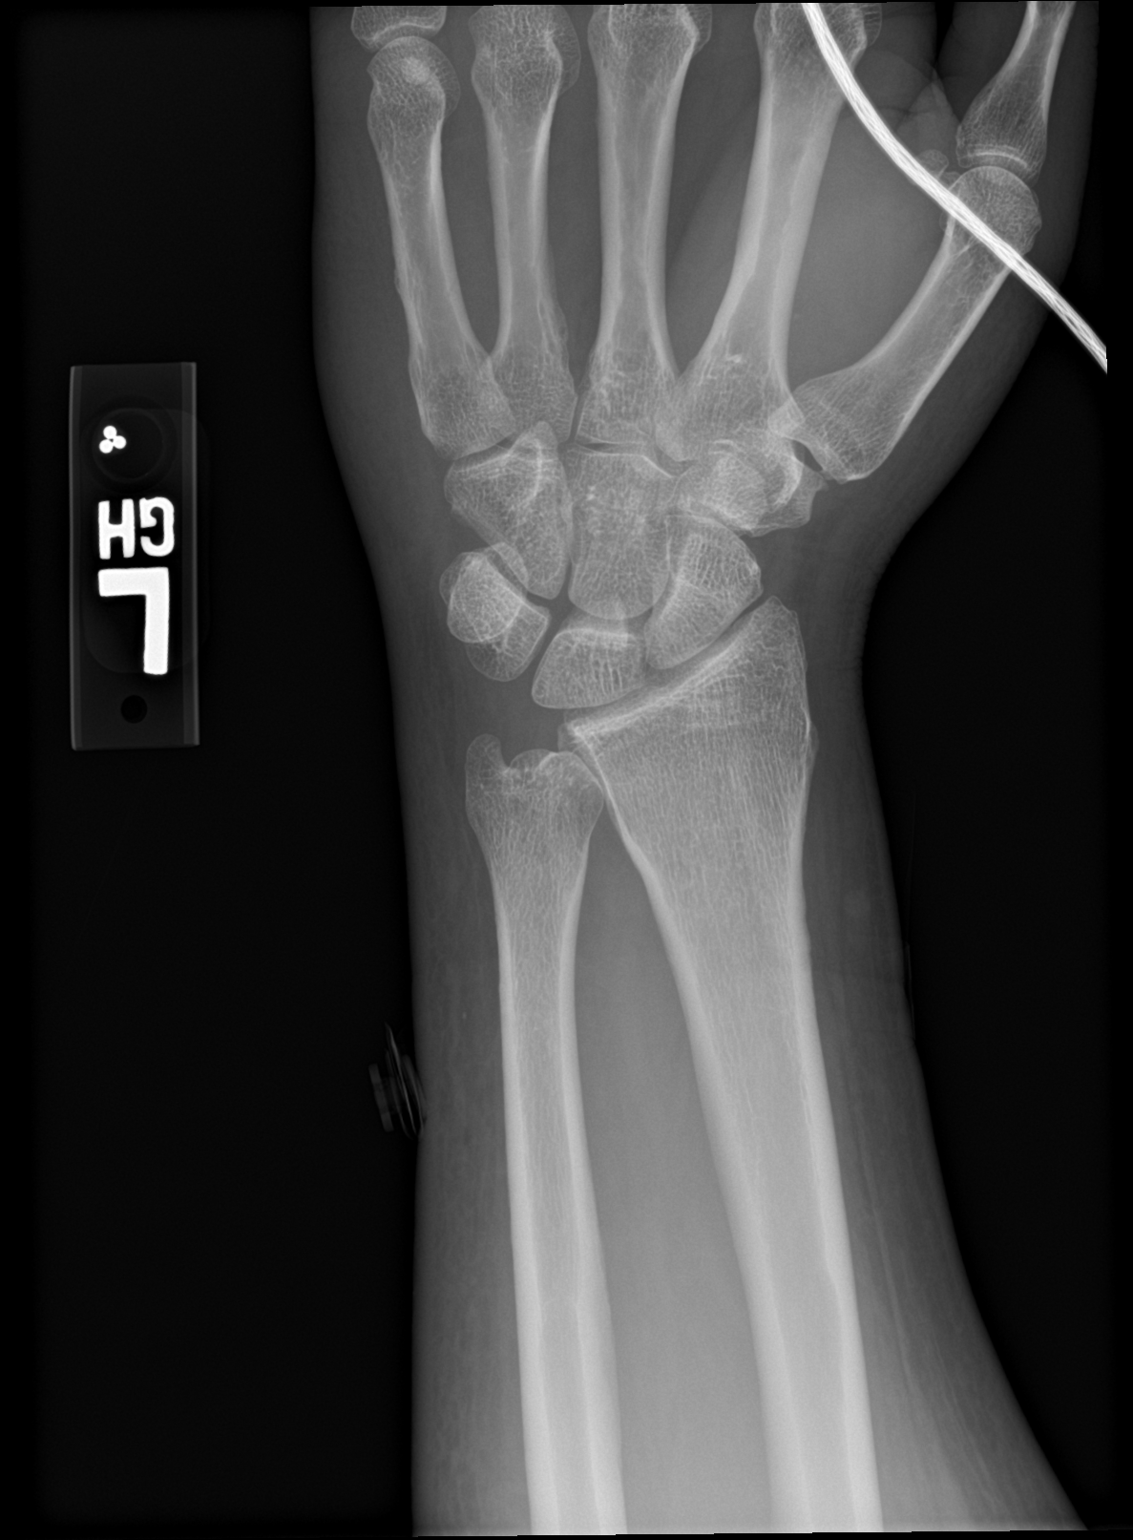

[wrist obl]
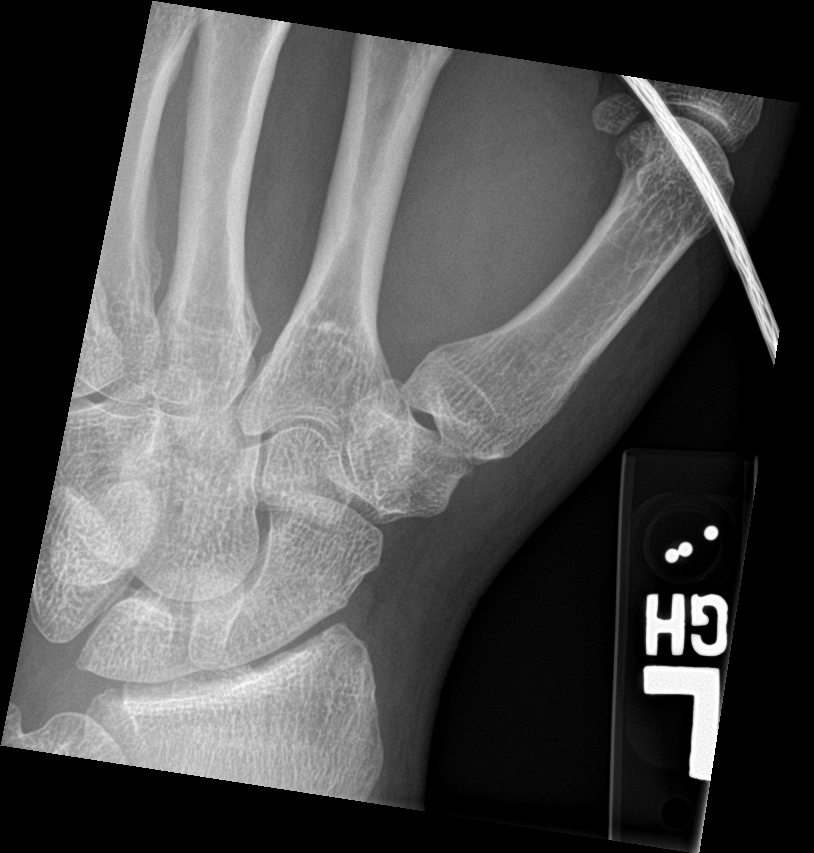

[wrist ap (2 of 2)]
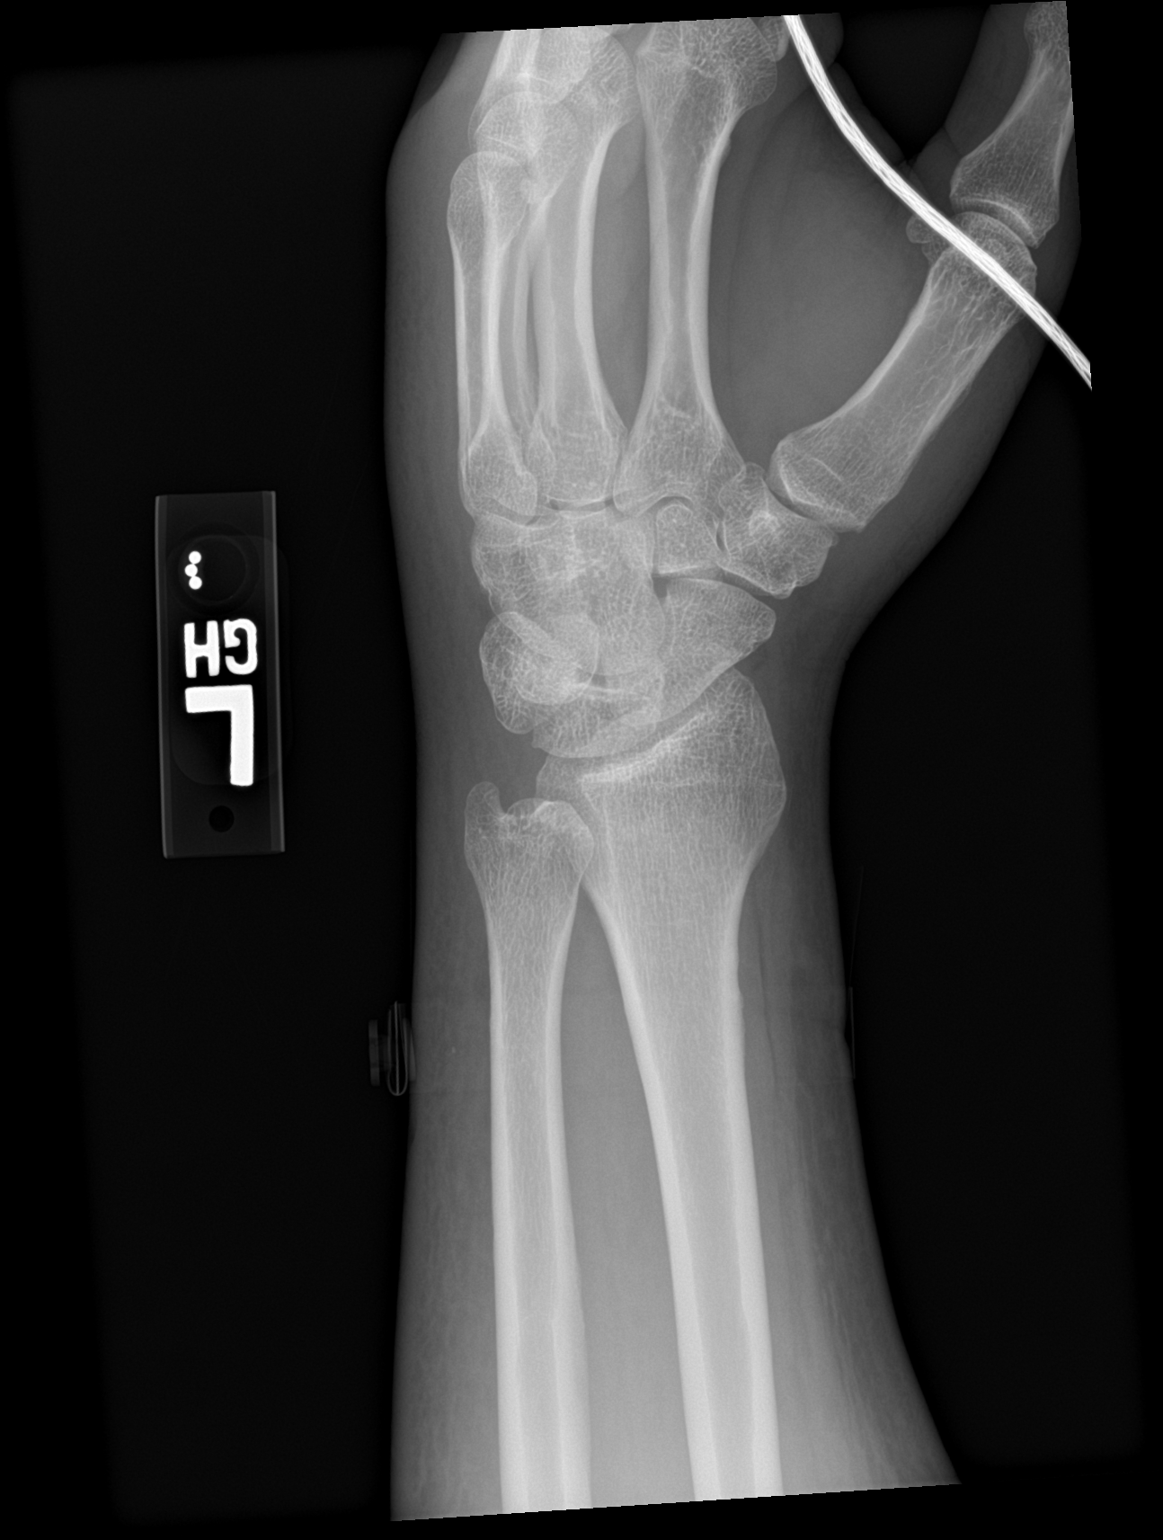

[wrist lat]
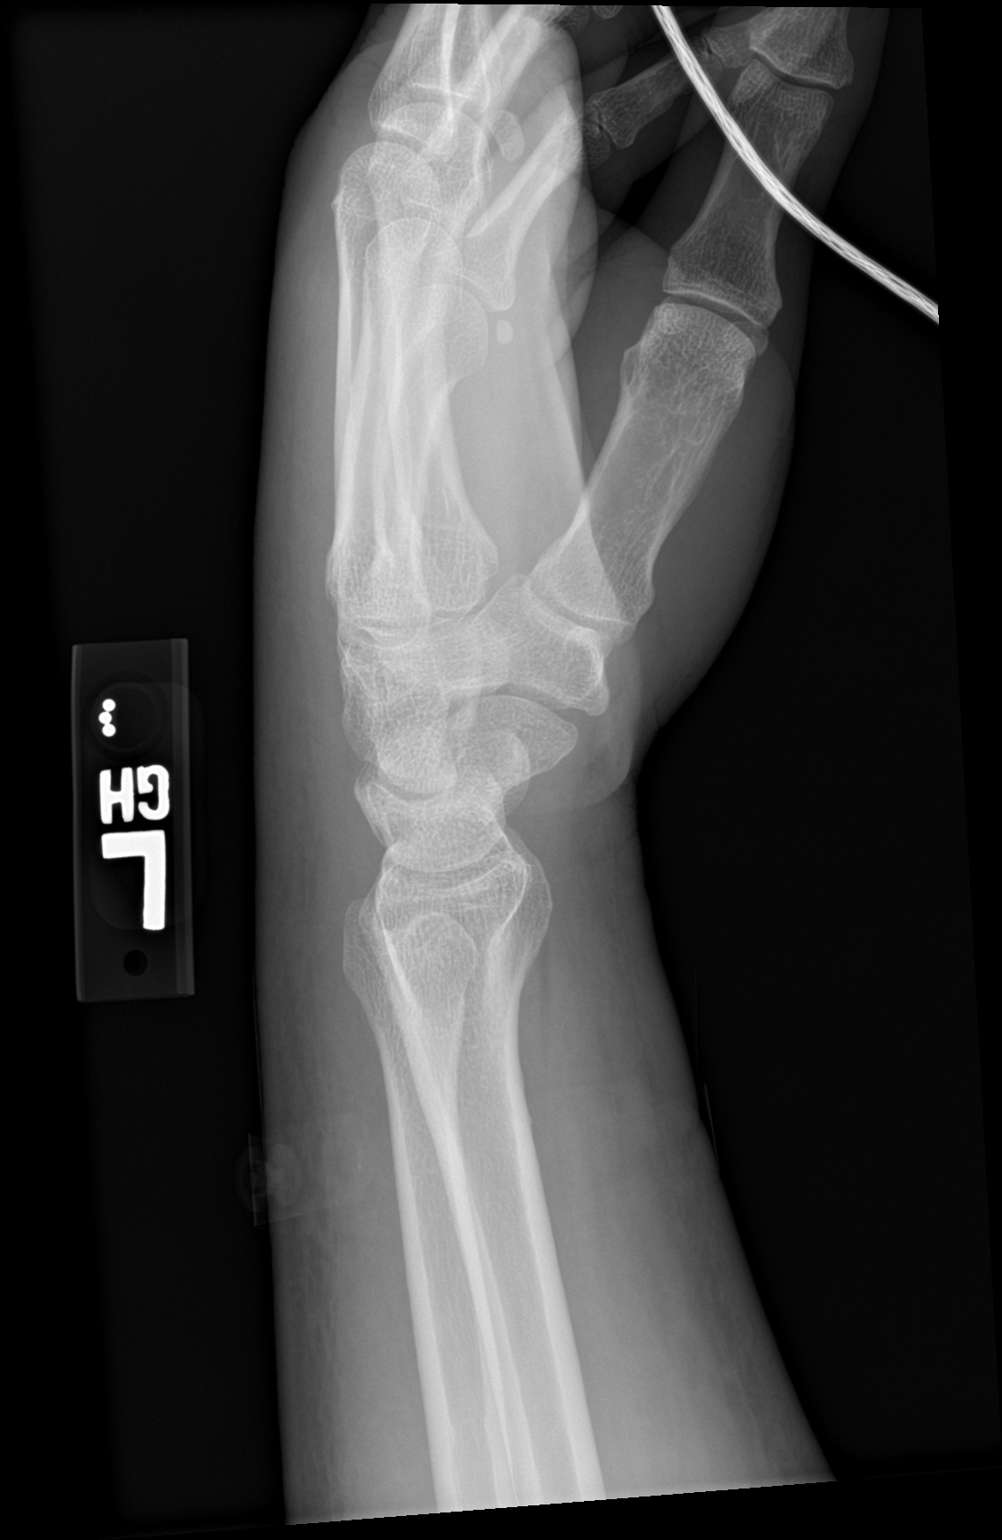

[4 of 4 positions shown; findings below may reference images not displayed]

FINDINGS: There is no evidence of fracture or dislocation. There is no
evidence of arthropathy or other focal bone abnormality. Soft
tissues are unremarkable.
IMPRESSION: Negative.

## 2020-04-24 IMAGING — MR MR WRIST*L* W/O CM
5 series · 40 of 40 positions shown · non-contrast
Comparison: None.

CLINICAL DATA: Question of infection, MSSA bacteremia

EXAM:
MR OF THE LEFT WRIST WITHOUT CONTRAST
TECHNIQUE: Multiplanar, multisequence MR imaging of the left wrist was
performed. No intravenous contrast was administered.

[Series 6: T1 · coronal · left · 4.0mm · 0.25mm/px · 8 of 20 slices shown (1 of 2)]
[im 1/20]
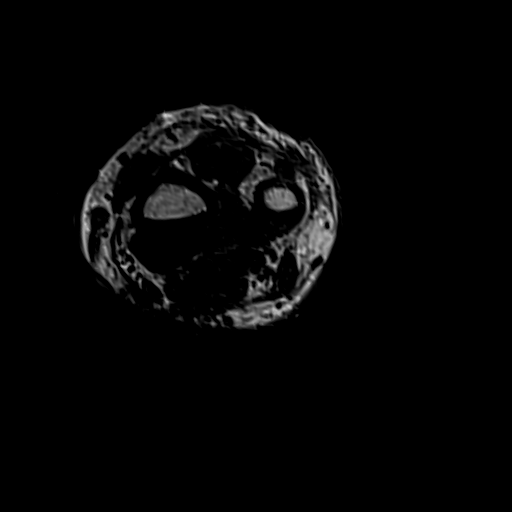
[im 3/20]
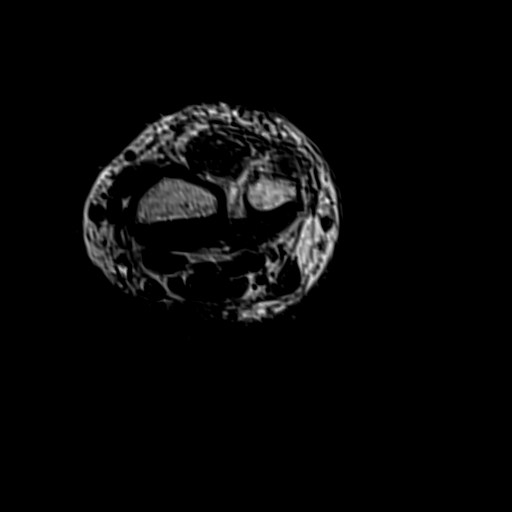
[im 6/20]
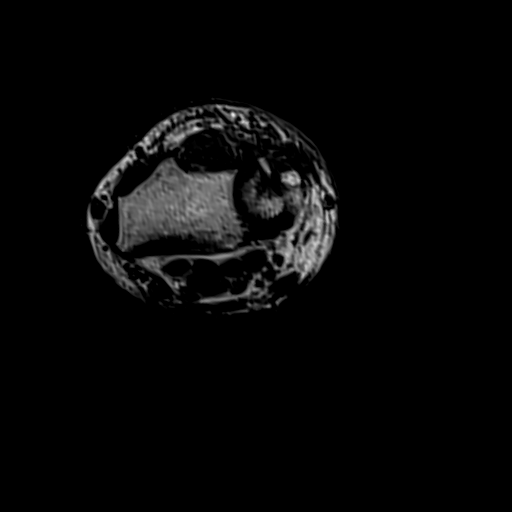
[im 9/20]
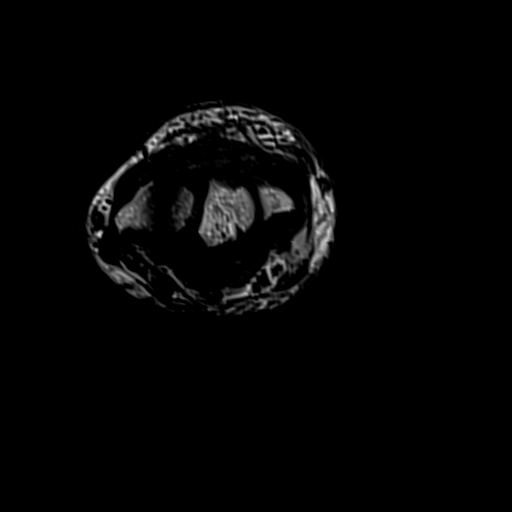
[im 11/20]
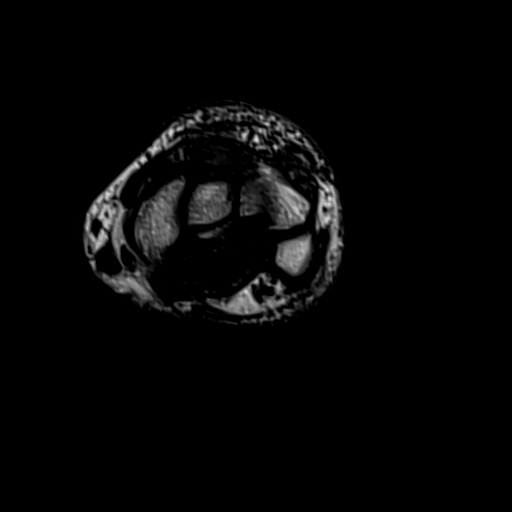
[im 14/20]
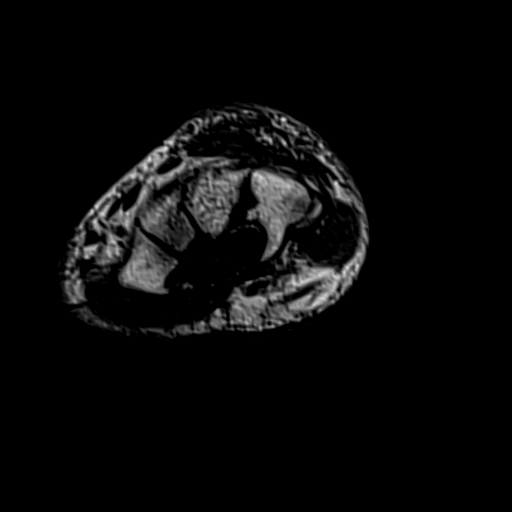
[im 17/20]
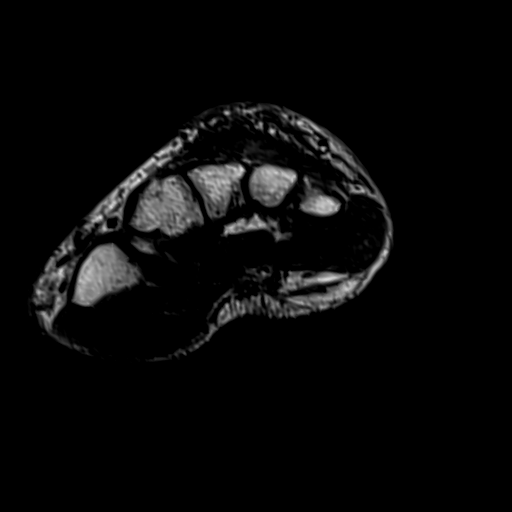
[im 20/20]
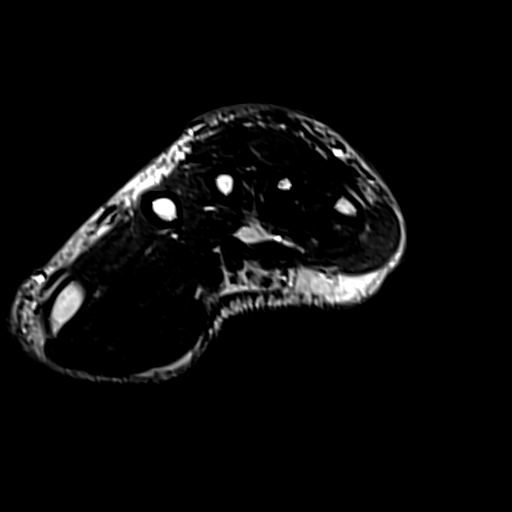

[Series 9: T1 · oblique · left · 3.0mm · 0.47mm/px · 8 of 19 slices shown (2 of 2)]
[im 1/19]
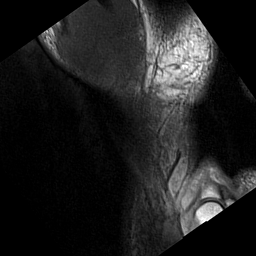
[im 3/19]
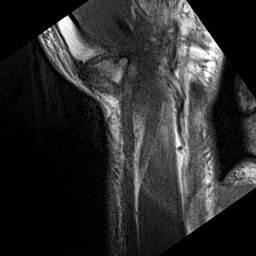
[im 6/19]
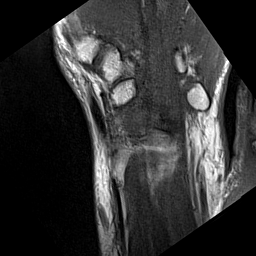
[im 8/19]
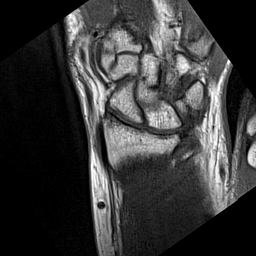
[im 11/19]
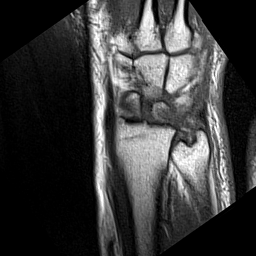
[im 13/19]
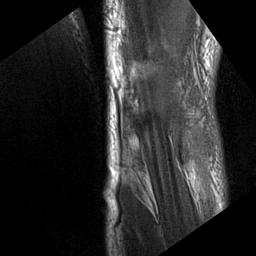
[im 16/19]
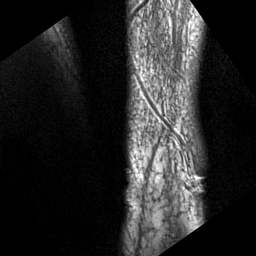
[im 19/19]
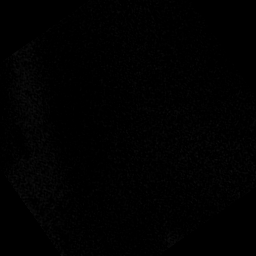

[Series 10: T2 fat-sat · oblique · left · 3.0mm · 0.47mm/px · 8 of 19 slices shown (1 of 2)]
[im 1/19]
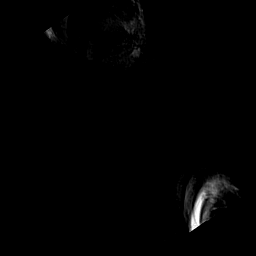
[im 3/19]
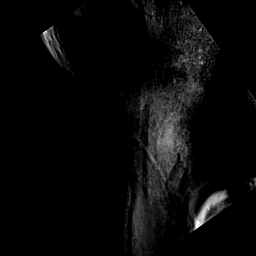
[im 6/19]
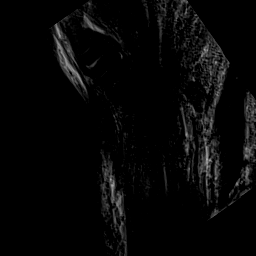
[im 8/19]
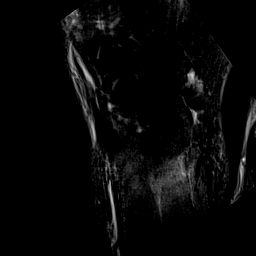
[im 11/19]
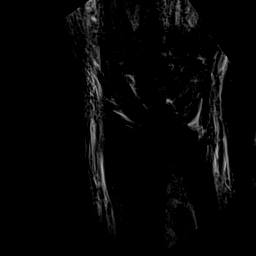
[im 13/19]
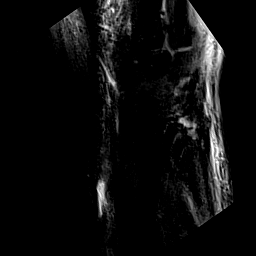
[im 16/19]
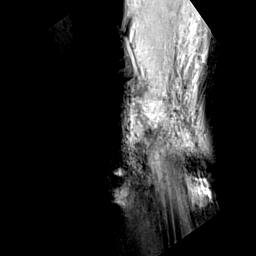
[im 19/19]
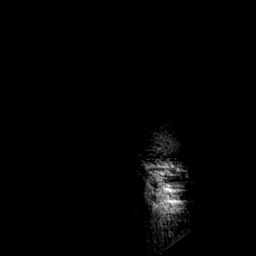

[Series 11: PD fat-sat · oblique · left · 3.0mm · 0.47mm/px · 8 of 21 slices shown]
[im 1/21]
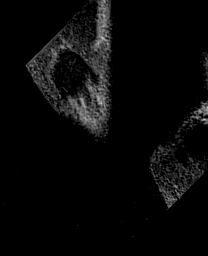
[im 3/21]
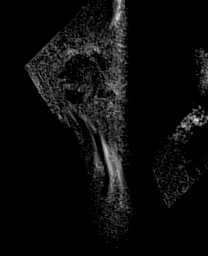
[im 6/21]
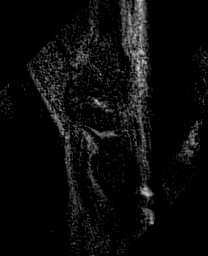
[im 9/21]
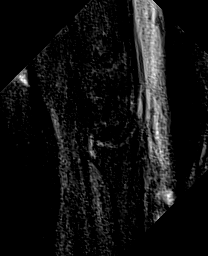
[im 12/21]
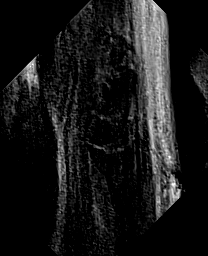
[im 15/21]
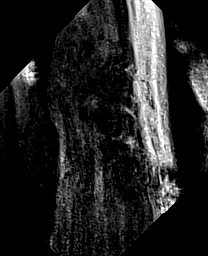
[im 18/21]
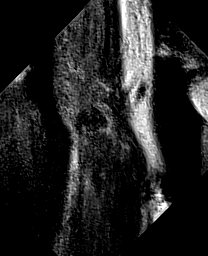
[im 21/21]
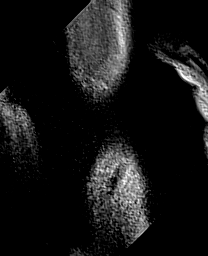

[Series 12: T2 fat-sat · coronal · left · 4.0mm · 0.55mm/px · 8 of 20 slices shown (2 of 2)]
[im 1/20]
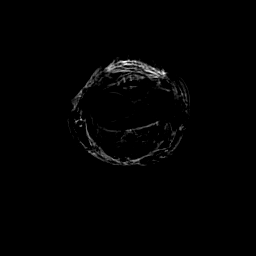
[im 3/20]
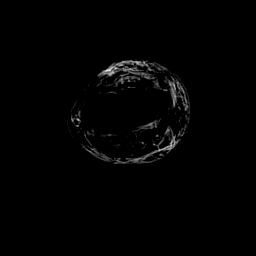
[im 6/20]
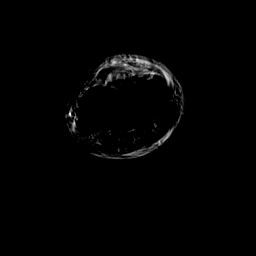
[im 9/20]
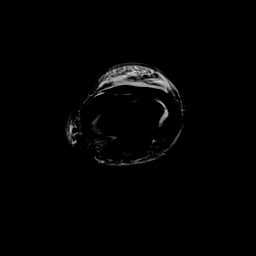
[im 11/20]
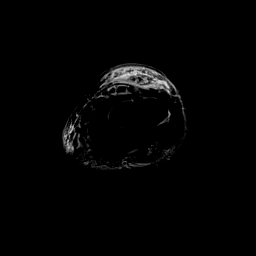
[im 14/20]
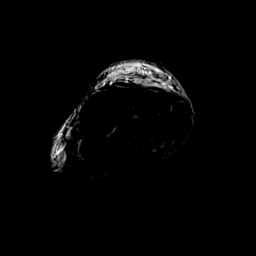
[im 17/20]
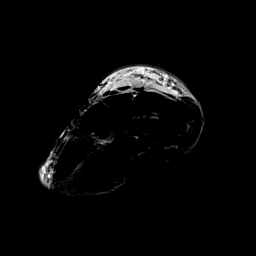
[im 20/20]
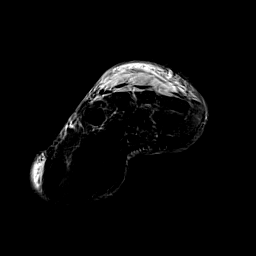

[40 of 40 positions shown; findings below may reference images not displayed]

FINDINGS: The study is limited due to patient motion.

Bones/Joint/Cartilage

No definite areas of cortical destruction or periosteal reaction.
Normal osseous marrow signal is seen throughout. There is an ulnar
minus variance. No large joint effusions are seen.

Ligaments

Somewhat limited, however the TFCC appears to be intact. The
scapholunate and lunotriquetral ligaments are grossly intact.

Muscles and Tendons
The muscles surrounding the wrist are normal appearance without
focal atrophy or tear. The flexor and extensor tendons are intact.

Soft tissue
Dorsal subcutaneous edema is seen surrounding the wrist. No focal
fluid collections or subcutaneous emphysema.
IMPRESSION: Study is somewhat limited due to patient motion. Diffuse dorsal
subcutaneous edema and findings of cellulitis. No definite soft
tissue abscess or evidence of osteomyelitis.

## 2020-04-24 MED ORDER — INSULIN ASPART 100 UNIT/ML ~~LOC~~ SOLN
2.0000 [IU] | Freq: Three times a day (TID) | SUBCUTANEOUS | Status: DC
  Administered 2020-04-24: 6 [IU] via SUBCUTANEOUS
  Administered 2020-04-25: 4 [IU] via SUBCUTANEOUS
  Administered 2020-04-25: 2 [IU] via SUBCUTANEOUS
  Administered 2020-04-25: 6 [IU] via SUBCUTANEOUS
  Administered 2020-04-26: 2 [IU] via SUBCUTANEOUS
  Administered 2020-04-26 (×2): 4 [IU] via SUBCUTANEOUS
  Administered 2020-04-27 (×2): 2 [IU] via SUBCUTANEOUS
  Administered 2020-04-28: 4 [IU] via SUBCUTANEOUS
  Administered 2020-04-28: 6 [IU] via SUBCUTANEOUS
  Administered 2020-04-28: 4 [IU] via SUBCUTANEOUS
  Administered 2020-04-29 (×2): 2 [IU] via SUBCUTANEOUS
  Administered 2020-04-29: 4 [IU] via SUBCUTANEOUS

## 2020-04-24 MED ORDER — INSULIN GLARGINE 100 UNIT/ML ~~LOC~~ SOLN
20.0000 [IU] | Freq: Every day | SUBCUTANEOUS | Status: DC
  Administered 2020-04-24: 20 [IU] via SUBCUTANEOUS
  Filled 2020-04-24 (×2): qty 0.2

## 2020-04-24 MED ORDER — INSULIN ASPART 100 UNIT/ML ~~LOC~~ SOLN
4.0000 [IU] | Freq: Three times a day (TID) | SUBCUTANEOUS | Status: DC
  Administered 2020-04-24 – 2020-04-26 (×6): 4 [IU] via SUBCUTANEOUS

## 2020-04-24 MED ORDER — INSULIN ASPART 100 UNIT/ML ~~LOC~~ SOLN: 2.0000 [IU] | SUBCUTANEOUS | Status: DC

## 2020-04-24 MED ORDER — POTASSIUM CHLORIDE 10 MEQ/100ML IV SOLN
10.0000 meq | INTRAVENOUS | Status: AC
  Administered 2020-04-24 (×5): 10 meq via INTRAVENOUS
  Filled 2020-04-24 (×5): qty 100

## 2020-04-24 NOTE — Progress Notes (Signed)
Pt blood sugars increasing. Dr. Carlis Abbott was made aware of situation. Dr. Carlis Abbott added meal coverage and increased Lantus instead of starting insulin drip. This nurse will continue to monitor.

## 2020-04-24 NOTE — Plan of Care (Signed)
  Problem: Clinical Measurements: Goal: Ability to maintain clinical measurements within normal limits will improve Outcome: Progressing   Problem: Pain Managment: Goal: General experience of comfort will improve Outcome: Progressing   Problem: Safety: Goal: Ability to remain free from injury will improve Outcome: Progressing   

## 2020-04-24 NOTE — Plan of Care (Signed)
This nurse will continue to monitor.

## 2020-04-24 NOTE — Consult Note (Signed)
NAME:  Jonathan Shaffer, MRN:  [id number], DOB:  January 15, 1966, LOS: 2 ADMISSION DATE:  04/10/2020, CONSULTATION DATE:  04/23/20 REFERRING MD:  Karleen Hampshire, CHIEF COMPLAINT:  Septic shock  Brief History   MSSA bacteremia +/- AC septic arthritis, septic emboli to spleen  History of present illness   Jonathan Shaffer is a 54 y/o gentleman admitted with sepsis, found to have MSSA bacteremia. He developed sudden onset nausea and vomiting and chills last week and later developed fevers and R shoulder pain. He was evaluated and felt to have a sprain. He was treated with an OP steroid injection and started oral steroids. He developed a painful rash on his hand. His progressive malaise, fevers, rash, and joint pain prompted return to the ED. At presentation he was febrile, tachycardic, tachypneic, with lactic acidosis. He was in DKA at presentation. He has a previous history of "high diabetes markers", which he reports he was not following up on. He was started on empiric vanc, zosyn & cefepime, and doxycycline. He has been evaluated by orthopaedics for possible septic arthritis of his R AC joint. ID has been consulted for MSSA+ blood cultures. No known history of IVDU or injuries prior to becoming ill. He has been encephalopathic this admission, prompting concern for septic emboli to the brain; MRI pending.  Past Medical History  Uncontrolled DM Testicular cancer; in remission HLD  Significant Hospital Events   Started vasopressors 11/20  Consults:  ID Ortho PCCM  Procedures:  PICC   Significant Diagnostic Tests:  MRI R shoulder> Echocardiogram> LUE US>  Micro Data:  Blood culture 11/19> MSSA  3/3 Urine culture 11/19> staph aureus  Antimicrobials:  Vanc 11/20 Zosyn 11/19 Cefepime 11/19- 11/20 Doxycycline 11/19 Nafcillin 11/20>  Interim history/subjective:  Off NE today. Still having pain all over and wrist pain on the left. No new complaints. Not sleeping well.  Objective   Blood pressure  115/67, pulse (!) 107, temperature 97.9 F (36.6 C), temperature source Oral, resp. rate (!) 34, height 6\' 3"  (1.905 m), weight 126.8 kg, SpO2 96 %.        Intake/Output Summary (Last 24 hours) at 04/24/2020 0950 Last data filed at 04/24/2020 9326 Gross per 24 hour  Intake 2980.8 ml  Output 3375 ml  Net -394.2 ml   Filed Weights   04/29/2020 0124 04/21/2020 0635  Weight: 117 kg 126.8 kg    Examination: General: Ill-appearing man lying in bed no acute distress HENT: [place]/AT, eyes anicteric Lungs: Breathing comfortably on room air, mild tachypnea.  Clear to auscultation bilaterally. Cardiovascular: Tachycardic, regular rhythm, no murmurs Abdomen: Soft, nontender, nondistended Extremities: Persistent erythema and pain of her right shoulder.  Persistent swelling, erythema, pain in the left forearm.  Pain with passive flexion and extension of the wrist, not adduction or abduction. Neuro: Fatigued, arouses easily. CAM-ICU+.  Moving all extremities spontaneously. Derm: Splinter hemorrhages on several toes, medially over right first MTP joint  Resolved Hospital Problem list   Lactic acidosis  Assessment & Plan:  Septic shock due to MSSA bacteremia; complicated by [facility] septic arthritis and clavicular osteomyelitis as an uncontrolled source. Echocardiogram without abnormalities, but concern for endocarditis with splenic hemorrhages and janeway lesions. Incomplete source control with AC septic arthritis. -Appreciate ID's assistance; con't nafcillin. -repeated blood cultures this morning -PICC will need to be removed when able to come off vasopressors; will need a new one placed after short line holiday if continuing nafcillin. -Likely needs TEE for further evaluation of embolic disease. -Needs definitive control  of AC joint and possible muscle abscess> planning for the OR tomorrow with Dr. Erlinda Hong. -Vasopressors as required to maintain MAP greater than 65.  Currently off.  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 -Strict I/O  Hypokalemia -Repleted with IV -Continue to monitor  Hyponatremia-stable -Continue to monitor  Uncontrolled DM with hyperglycemia; prev in DKA. A1c 11.3 -basal bolus insulin; increasing glargine today -SSI QID PRN -goal BG 140-180 while critically ill; if he remains uncontrolled we will reescalate to insulin infusion -needs OP DM education and much more strict control  Acute anemia; suspect this is mixed due to critical illness, dilution -con't to monitor -transfuse for Hb <7 or hemodynamically significant bleeding -If continues to worsen will assess hemolysis labs.  Hyperbilirubinemia likely 2/2 sepsis, mild coagulopathy -con't to monitor  Wrist pain - concern for cellulitis vs septic arthritis -xrays; if unrevealing needs MRI. Discussed with Ortho  Delirium- multifactorial due to sepsis, sleep disruption, ICU delirium -Out of bed mobility -Prioritize sleep-wake cycles  Best practice:  Diet: clears Pain/Anxiety/Delirium protocol (if indicated): fentanyl PRN VAP protocol (if indicated): n/a DVT prophylaxis: lovenox GI prophylaxis: n/a Glucose control: basal bolus + SSI Mobility: progressive Code Status: full Family Communication: wife updated at bedside 11/21 Disposition: ICU  Labs   CBC: Recent Labs  Lab 04/18/2020 0202 04/12/2020 0204 04/23/20 0537 04/24/20 0600  WBC 21.3*  --  16.3* 14.8*  NEUTROABS 19.5*  --   --   --   HGB 14.0 15.6 11.0* 10.8*  HCT 41.6 46.0 32.8* 32.1*  MCV 89.1  --  89.4 89.4  PLT 191  --  151 751    Basic Metabolic Panel: Recent Labs  Lab 04/29/2020 2200 04/23/20 0130 04/23/20 0537 04/23/20 1800 04/24/20 0600  NA 131* 132* 134* 130* 130*  K 3.3* 3.3* 2.8* 3.6 3.4*  CL 102 103 103 101 100  CO2 20* 20* 22 16* 19*  GLUCOSE 198* 198* 173* 328* 333*  BUN 23* 25* 25* 26* 25*  CREATININE 1.33* 1.41* 1.34*  1.38* 1.44*  CALCIUM 7.5* 7.3* 7.4* 7.5* 7.1*  MG  --   --  2.0  --   --    GFR: Estimated Creatinine Clearance: 84.1 mL/min (A) (by C-G formula based on SCr of 1.44 mg/dL (H)). Recent Labs  Lab 04/19/2020 0202 04/10/2020 0405 04/05/2020 2200 04/23/20 0537 04/23/20 1345 04/23/20 1701 04/23/20 2015 04/24/20 0600  WBC 21.3*  --   --  16.3*  --   --   --  14.8*  LATICACIDVEN 3.3*   < > 1.4  --  9.0* 1.3 1.4  --    < > = values in this interval not displayed.    Liver Function Tests: Recent Labs  Lab 04/18/2020 0202 04/23/20 0537  AST 40 41  ALT 48* 32  ALKPHOS 133* 75  BILITOT 2.0* 1.5*  PROT 6.4* 4.9*  ALBUMIN 2.4* 1.7*   Recent Labs  Lab 05/02/2020 0202  LIPASE 35   Recent Labs  Lab 04/07/2020 0202  AMMONIA 23    ABG    Component Value Date/Time   HCO3 18.0 (L) 04/12/2020 0204   TCO2 19 (L) 04/18/2020 0204   ACIDBASEDEF 7.0 (H) 05/03/2020 0204   O2SAT 50.0 04/21/2020 0204     Coagulation Profile: Recent Labs  Lab 04/14/2020 0202  INR 1.3*    Cardiac Enzymes: Recent Labs  Lab 04/15/2020 0202  CKTOTAL 70    HbA1C: Hgb  A1c MFr Bld  Date/Time Value Ref Range Status  04/04/2020 10:17 AM 11.3 (H) 4.8 - 5.6 % Final    Comment:    (NOTE) Pre diabetes:          5.7%-6.4%  Diabetes:              >6.4%  Glycemic control for   <7.0% adults with diabetes     CBG: Recent Labs  Lab 04/23/20 1155 04/23/20 1806 04/23/20 2056 04/23/20 2152 04/24/20 0801  GLUCAP 168* 305* 338* 296* 311*   This patient is critically ill with multiple organ system failure which requires frequent high complexity decision making, assessment, support, evaluation, and titration of therapies. This was completed through the application of advanced monitoring technologies and extensive interpretation of multiple databases. During this encounter critical care time was devoted to patient care services described in this note for 40 minutes.   Julian Hy, DO 04/24/20 9:50 AM [place]  [facility]

## 2020-04-24 NOTE — Progress Notes (Signed)
Jonathan Shaffer for Infectious Disease   Reason for visit: Follow up on bacteremia  Interval History: MRI with significant shoulder infection, for surgery tomorrow.  Wife at bedside.  Total antibiotic days 4 Nafcillin day 2  Physical Exam: Constitutional:  Vitals:   04/24/20 1325 04/24/20 1400  BP:  108/73  Pulse: (!) 109 (!) 103  Resp: (!) 24 (!) 29  Temp:    SpO2: 99% 100%   patient appears in NAD Respiratory: Normal respiratory effort; CTA B Cardiovascular: RRR GI: soft, nt, nd  Review of Systems: Constitutional: negative for fevers and chills Gastrointestinal: negative for nausea and diarrhea  Lab Results  Component Value Date   WBC 14.8 (H) 04/24/2020   HGB 10.8 (L) 04/24/2020   HCT 32.1 (L) 04/24/2020   MCV 89.4 04/24/2020   PLT 181 04/24/2020    Lab Results  Component Value Date   CREATININE 1.44 (H) 04/24/2020   BUN 25 (H) 04/24/2020   NA 130 (L) 04/24/2020   K 3.4 (L) 04/24/2020   CL 100 04/24/2020   CO2 19 (L) 04/24/2020    Lab Results  Component Value Date   ALT 32 04/23/2020   AST 41 04/23/2020   ALKPHOS 75 04/23/2020     Microbiology: Recent Results (from the past 240 hour(s))  Blood Culture (routine x 2)     Status: Abnormal   Collection Time: 04/28/2020  2:00 AM   Specimen: BLOOD RIGHT HAND  Result Value Ref Range Status   Specimen Description   Final    BLOOD RIGHT HAND Performed at [facility], [address] [place], [place] [address]    Special Requests   Final    BOTTLES DRAWN AEROBIC AND ANAEROBIC Blood Culture adequate volume Performed at [facility], [address] [place], [place] [address]    Culture  Setup Time   Final    GRAM POSITIVE COCCI IN BOTH AEROBIC AND ANAEROBIC BOTTLES CRITICAL RESULT CALLED TO, READ BACK BY AND VERIFIED WITHSeward Meth [facility] 2109 04/14/2020 A BROWNING Performed at [facility], [place] [address] [place], [place] [address]    Culture STAPHYLOCOCCUS AUREUS (A)   Final   Report Status 04/24/2020 FINAL  Final   Organism ID, Bacteria STAPHYLOCOCCUS AUREUS  Final      Susceptibility   Staphylococcus aureus - MIC*    CIPROFLOXACIN <=0.5 SENSITIVE Sensitive     ERYTHROMYCIN <=0.25 SENSITIVE Sensitive     GENTAMICIN <=0.5 SENSITIVE Sensitive     OXACILLIN 0.5 SENSITIVE Sensitive     TETRACYCLINE <=1 SENSITIVE Sensitive     VANCOMYCIN 1 SENSITIVE Sensitive     TRIMETH/SULFA <=10 SENSITIVE Sensitive     CLINDAMYCIN <=0.25 SENSITIVE Sensitive     RIFAMPIN <=0.5 SENSITIVE Sensitive     Inducible Clindamycin NEGATIVE Sensitive     * STAPHYLOCOCCUS AUREUS  Blood Culture ID Panel (Reflexed)     Status: Abnormal   Collection Time: 04/15/2020  2:00 AM  Result Value Ref Range Status   Enterococcus faecalis NOT DETECTED NOT DETECTED Final   Enterococcus Faecium NOT DETECTED NOT DETECTED Final   Listeria monocytogenes NOT DETECTED NOT DETECTED Final   Staphylococcus species DETECTED (A) NOT DETECTED Final    Comment: CRITICAL RESULT CALLED TO, READ BACK BY AND VERIFIED WITH: N GOLGOVAC PHARMD 2109 04/29/2020 A BROWNING    Staphylococcus aureus (BCID) DETECTED (A) NOT DETECTED Final    Comment: CRITICAL RESULT CALLED TO, READ BACK BY AND  VERIFIED WITH: Seward Meth PHARMD 2109 04/29/2020 A BROWNING    Staphylococcus epidermidis NOT DETECTED NOT DETECTED Final   Staphylococcus lugdunensis NOT DETECTED NOT DETECTED Final   Streptococcus species NOT DETECTED NOT DETECTED Final   Streptococcus agalactiae NOT DETECTED NOT DETECTED Final   Streptococcus pneumoniae NOT DETECTED NOT DETECTED Final   Streptococcus pyogenes NOT DETECTED NOT DETECTED Final   A.calcoaceticus-baumannii NOT DETECTED NOT DETECTED Final   Bacteroides fragilis NOT DETECTED NOT DETECTED Final   Enterobacterales NOT DETECTED NOT DETECTED Final   Enterobacter cloacae complex NOT DETECTED NOT DETECTED Final   Escherichia coli NOT DETECTED NOT DETECTED Final   Klebsiella aerogenes NOT DETECTED NOT  DETECTED Final   Klebsiella oxytoca NOT DETECTED NOT DETECTED Final   Klebsiella pneumoniae NOT DETECTED NOT DETECTED Final   Proteus species NOT DETECTED NOT DETECTED Final   Salmonella species NOT DETECTED NOT DETECTED Final   Serratia marcescens NOT DETECTED NOT DETECTED Final   Haemophilus influenzae NOT DETECTED NOT DETECTED Final   Neisseria meningitidis NOT DETECTED NOT DETECTED Final   Pseudomonas aeruginosa NOT DETECTED NOT DETECTED Final   Stenotrophomonas maltophilia NOT DETECTED NOT DETECTED Final   Candida albicans NOT DETECTED NOT DETECTED Final   Candida auris NOT DETECTED NOT DETECTED Final   Candida glabrata NOT DETECTED NOT DETECTED Final   Candida krusei NOT DETECTED NOT DETECTED Final   Candida parapsilosis NOT DETECTED NOT DETECTED Final   Candida tropicalis NOT DETECTED NOT DETECTED Final   Cryptococcus neoformans/gattii NOT DETECTED NOT DETECTED Final   Meth resistant mecA/C and MREJ NOT DETECTED NOT DETECTED Final    Comment: Performed at [facility], [address], [place], [place] [address]  Urine culture     Status: Abnormal   Collection Time: 04/24/2020  2:02 AM   Specimen: In/Out Cath Urine  Result Value Ref Range Status   Specimen Description IN/OUT CATH URINE  Final   Special Requests NONE  Final   Culture 40,000 COLONIES/mL STAPHYLOCOCCUS AUREUS (A)  Final   Report Status 04/24/2020 FINAL  Final   Organism ID, Bacteria STAPHYLOCOCCUS AUREUS (A)  Final      Susceptibility   Staphylococcus aureus - MIC*    CIPROFLOXACIN <=0.5 SENSITIVE Sensitive     GENTAMICIN <=0.5 SENSITIVE Sensitive     NITROFURANTOIN 32 SENSITIVE Sensitive     OXACILLIN <=0.25 SENSITIVE Sensitive     TETRACYCLINE <=1 SENSITIVE Sensitive     VANCOMYCIN 1 SENSITIVE Sensitive     TRIMETH/SULFA <=10 SENSITIVE Sensitive     CLINDAMYCIN <=0.25 SENSITIVE Sensitive     RIFAMPIN <=0.5 SENSITIVE Sensitive     Inducible Clindamycin NEGATIVE Sensitive     * 40,000 COLONIES/mL  STAPHYLOCOCCUS AUREUS  Resp Panel by RT-PCR (Flu A&B, Covid) Nasopharyngeal Swab     Status: None   Collection Time: 04/05/2020  2:05 AM   Specimen: Nasopharyngeal Swab; Nasopharyngeal(NP) swabs in vial transport medium  Result Value Ref Range Status   SARS Coronavirus 2 by RT PCR NEGATIVE NEGATIVE Final    Comment: (NOTE) SARS-CoV-2 target nucleic acids are NOT DETECTED.  The SARS-CoV-2 RNA is generally detectable in upper respiratory specimens during the acute phase of infection. The lowest concentration of SARS-CoV-2 viral copies this assay can detect is 138 copies/mL. A negative result does not preclude SARS-Cov-2 infection and should not be used as the sole basis for treatment or other patient management decisions. A negative result may occur with  improper specimen collection/handling, submission of specimen other than nasopharyngeal  swab, presence of viral mutation(s) within the areas targeted by this assay, and inadequate number of viral copies(<138 copies/mL). A negative result must be combined with clinical observations, patient history, and epidemiological information. The expected result is Negative.  Fact Sheet for Patients:  EntrepreneurPulse.com.au  Fact Sheet for Healthcare Providers:  IncredibleEmployment.be  This test is no t yet approved or cleared by the [place] FDA and  has been authorized for detection and/or diagnosis of SARS-CoV-2 by FDA under an Emergency Use Authorization (EUA). This EUA will remain  in effect (meaning this test can be used) for the duration of the COVID-19 declaration under Section 564(b)(1) of the Act, 21 U.S.C.section 360bbb-3(b)(1), unless the authorization is terminated  or revoked sooner.       Influenza A by PCR NEGATIVE NEGATIVE Final   Influenza B by PCR NEGATIVE NEGATIVE Final    Comment: (NOTE) The Xpert Xpress SARS-CoV-2/FLU/RSV plus assay is intended as an aid in the diagnosis of  influenza from Nasopharyngeal swab specimens and should not be used as a sole basis for treatment. Nasal washings and aspirates are unacceptable for Xpert Xpress SARS-CoV-2/FLU/RSV testing.  Fact Sheet for Patients: EntrepreneurPulse.com.au  Fact Sheet for Healthcare Providers: IncredibleEmployment.be  This test is not yet approved or cleared by the [place] FDA and has been authorized for detection and/or diagnosis of SARS-CoV-2 by FDA under an Emergency Use Authorization (EUA). This EUA will remain in effect (meaning this test can be used) for the duration of the COVID-19 declaration under Section 564(b)(1) of the Act, 21 U.S.C. section 360bbb-3(b)(1), unless the authorization is terminated or revoked.  Performed at [facility], [address] [place], [place] [address]   Blood Culture (routine x 2)     Status: Abnormal   Collection Time: 04/10/2020  2:06 AM   Specimen: BLOOD LEFT FOREARM  Result Value Ref Range Status   Specimen Description   Final    BLOOD LEFT FOREARM Performed at [facility], [address] [place], [place] [address]    Special Requests   Final    BOTTLES DRAWN AEROBIC AND ANAEROBIC Blood Culture adequate volume Performed at [facility], [address] [place], [place] [address]    Culture  Setup Time   Final    GRAM POSITIVE COCCI IN BOTH AEROBIC AND ANAEROBIC BOTTLES CRITICAL VALUE NOTED.  VALUE IS CONSISTENT WITH PREVIOUSLY REPORTED AND CALLED VALUE.    Culture (A)  Final    STAPHYLOCOCCUS AUREUS SUSCEPTIBILITIES PERFORMED ON PREVIOUS CULTURE WITHIN THE LAST 5 DAYS. Performed at [facility], [place] [address], [place], [place] [address]    Report Status 04/24/2020 FINAL  Final  MRSA PCR Screening     Status: None   Collection Time: 04/07/2020  6:24 AM   Specimen: Nasopharyngeal  Result Value Ref Range Status   MRSA by PCR NEGATIVE NEGATIVE Final    Comment:          The GeneXpert MRSA Assay (FDA approved for NASAL specimens only), is one component of a comprehensive MRSA colonization surveillance program. It is not intended to diagnose MRSA infection nor to guide or monitor treatment for MRSA infections. Performed at [facility], [place] [address], [place], [place] [address]   Culture, blood (single)     Status: None (Preliminary result)   Collection Time: 04/06/2020  1:53 PM   Specimen: BLOOD RIGHT HAND  Result Value Ref Range Status   Specimen Description  Final    BLOOD RIGHT HAND Performed at [facility], [place] [address] [place], [place] [address]    Special Requests   Final    BOTTLES DRAWN AEROBIC AND ANAEROBIC Blood Culture results may not be optimal due to an inadequate volume of blood received in culture bottles Performed at [place] [address], [place], [place] [address]    Culture   Final    NO GROWTH 2 DAYS Performed at [place] [address] [place], [place] [address]    Report Status PENDING  Incomplete  Culture, blood (Routine X 2) w Reflex to ID Panel     Status: None (Preliminary result)   Collection Time: 04/24/20  7:52 AM   Specimen: BLOOD LEFT HAND  Result Value Ref Range Status   Specimen Description   Final    BLOOD LEFT HAND Performed at [facility], [place] [address] [place], [place] [address]    Special Requests   Final    BOTTLES DRAWN AEROBIC AND ANAEROBIC BLOOD LEFT HAND Performed at [place] [address] [place], [place] [address]    Culture PENDING  Incomplete   Report Status PENDING  Incomplete  Culture, blood (Routine X 2) w Reflex to ID Panel     Status: None (Preliminary result)   Collection Time: 04/24/20  7:52 AM   Specimen: BLOOD LEFT HAND  Result Value Ref Range Status   Specimen Description   Final    BLOOD LEFT HAND Performed at [facility], [place] [address], [place],  [place] [address]    Special Requests   Final    BOTTLES DRAWN AEROBIC AND ANAEROBIC BLOOD LEFT HAND Performed at [place] [address] [place], [place] [address]    Culture PENDING  Incomplete   Report Status PENDING  Incomplete    Impression/Plan:  1. MSSA bacteremia - disseminated infection with septic CNS emboli, shoulder infection, emboli to left foot, hand, spleen.  On nafcillin for better CNS penetration.   TTE without obvious vegetation.    TEE when able  2.  picc line - has a line in now and will need some line holiday when able.    3.  DKA - he is now learning about controlling his DM.  Will need continued efforts going forward.

## 2020-04-24 NOTE — Progress Notes (Signed)
Informed consent signed and placed in patient's chart. 

## 2020-04-24 NOTE — Progress Notes (Addendum)
I have reviewed MRI scan of the right shoulder and findings are suspicious for deep infection.  Patient will need formal I&D of this region once he is medically stable.  It sounds like the patient is hemodynamically labile at this time and will a couple of days to stabilize.  I will follow along and tentatively plan for I&D this coming Wednesday.    Azucena Cecil, MD [facility] [phone] 9:03 AM    Discussed with CCM today and they feel he is stable for surgery Monday.  I have posted his surgery for tomorrow afternoon.    Patient updated over the phone.  All questions answered.  Informed consent obtained for surgery.  NPO after midnight.

## 2020-04-25 ENCOUNTER — Encounter (HOSPITAL_COMMUNITY): Payer: Self-pay | Admitting: Family Medicine

## 2020-04-25 ENCOUNTER — Encounter (HOSPITAL_COMMUNITY): Admission: EM | Disposition: E | Payer: Self-pay | Source: Home / Self Care | Attending: Cardiothoracic Surgery

## 2020-04-25 ENCOUNTER — Inpatient Hospital Stay (HOSPITAL_COMMUNITY): Payer: BC Managed Care – PPO | Admitting: Anesthesiology

## 2020-04-25 DIAGNOSIS — L03818 Cellulitis of other sites: Secondary | ICD-10-CM

## 2020-04-25 DIAGNOSIS — N179 Acute kidney failure, unspecified: Secondary | ICD-10-CM | POA: Diagnosis not present

## 2020-04-25 DIAGNOSIS — A4101 Sepsis due to Methicillin susceptible Staphylococcus aureus: Secondary | ICD-10-CM | POA: Diagnosis not present

## 2020-04-25 DIAGNOSIS — L02413 Cutaneous abscess of right upper limb: Secondary | ICD-10-CM | POA: Diagnosis not present

## 2020-04-25 DIAGNOSIS — R739 Hyperglycemia, unspecified: Secondary | ICD-10-CM | POA: Diagnosis not present

## 2020-04-25 DIAGNOSIS — E871 Hypo-osmolality and hyponatremia: Secondary | ICD-10-CM

## 2020-04-25 DIAGNOSIS — E876 Hypokalemia: Secondary | ICD-10-CM

## 2020-04-25 HISTORY — PX: MINOR IRRIGATION AND DEBRIDEMENT OF WOUND: SHX6239

## 2020-04-25 LAB — COMPREHENSIVE METABOLIC PANEL
ALT: 36 U/L (ref 0–44)
AST: 24 U/L (ref 15–41)
Albumin: 1.6 g/dL — ABNORMAL LOW (ref 3.5–5.0)
Alkaline Phosphatase: 91 U/L (ref 38–126)
Anion gap: 9 (ref 5–15)
BUN: 26 mg/dL — ABNORMAL HIGH (ref 6–20)
CO2: 22 mmol/L (ref 22–32)
Calcium: 7.1 mg/dL — ABNORMAL LOW (ref 8.9–10.3)
Chloride: 100 mmol/L (ref 98–111)
Creatinine, Ser: 1.41 mg/dL — ABNORMAL HIGH (ref 0.61–1.24)
GFR, Estimated: 59 mL/min — ABNORMAL LOW (ref >60)
Glucose, Bld: 237 mg/dL — ABNORMAL HIGH (ref 70–99)
Potassium: 3.1 mmol/L — ABNORMAL LOW (ref 3.5–5.1)
Sodium: 131 mmol/L — ABNORMAL LOW (ref 135–145)
Total Bilirubin: 2.3 mg/dL — ABNORMAL HIGH (ref 0.3–1.2)
Total Protein: 5.2 g/dL — ABNORMAL LOW (ref 6.5–8.1)

## 2020-04-25 LAB — GLUCOSE, CAPILLARY
Glucose-Capillary: 315 mg/dL — ABNORMAL HIGH (ref 70–99)
Glucose-Capillary: 223 mg/dL — ABNORMAL HIGH (ref 70–99)
Glucose-Capillary: 174 mg/dL — ABNORMAL HIGH (ref 70–99)
Glucose-Capillary: 151 mg/dL — ABNORMAL HIGH (ref 70–99)
Glucose-Capillary: 139 mg/dL — ABNORMAL HIGH (ref 70–99)

## 2020-04-25 SURGERY — MINOR IRRIGATION AND DEBRIDEMENT OF WOUND
Anesthesia: General | Laterality: Right

## 2020-04-25 MED ORDER — FENTANYL CITRATE (PF) 100 MCG/2ML IJ SOLN
INTRAMUSCULAR | Status: AC
  Filled 2020-04-25: qty 2

## 2020-04-25 MED ORDER — MIDAZOLAM HCL 2 MG/2ML IJ SOLN
INTRAMUSCULAR | Status: AC
  Filled 2020-04-25: qty 2

## 2020-04-25 MED ORDER — HYDROMORPHONE HCL 2 MG/ML IJ SOLN
INTRAMUSCULAR | Status: AC
  Filled 2020-04-25: qty 1

## 2020-04-25 MED ORDER — LIDOCAINE 2% (20 MG/ML) 5 ML SYRINGE
INTRAMUSCULAR | Status: DC | PRN
  Administered 2020-04-25: 100 mg via INTRAVENOUS

## 2020-04-25 MED ORDER — PHENYLEPHRINE HCL-NACL 10-0.9 MG/250ML-% IV SOLN
INTRAVENOUS | Status: DC | PRN
  Administered 2020-04-25: 40 ug/min via INTRAVENOUS

## 2020-04-25 MED ORDER — PROPOFOL 10 MG/ML IV BOLUS
INTRAVENOUS | Status: DC | PRN
  Administered 2020-04-25: 160 mg via INTRAVENOUS

## 2020-04-25 MED ORDER — ROCURONIUM BROMIDE 10 MG/ML (PF) SYRINGE
PREFILLED_SYRINGE | INTRAVENOUS | Status: DC | PRN
  Administered 2020-04-25: 45 mg via INTRAVENOUS

## 2020-04-25 MED ORDER — FENTANYL CITRATE (PF) 100 MCG/2ML IJ SOLN
25.0000 ug | INTRAMUSCULAR | Status: DC | PRN
  Administered 2020-04-25 (×2): 25 ug via INTRAVENOUS

## 2020-04-25 MED ORDER — ONDANSETRON HCL 4 MG/2ML IJ SOLN
INTRAMUSCULAR | Status: AC
  Filled 2020-04-25: qty 2

## 2020-04-25 MED ORDER — DEXAMETHASONE SODIUM PHOSPHATE 10 MG/ML IJ SOLN
INTRAMUSCULAR | Status: AC
  Filled 2020-04-25: qty 1

## 2020-04-25 MED ORDER — LIDOCAINE 2% (20 MG/ML) 5 ML SYRINGE
INTRAMUSCULAR | Status: AC
  Filled 2020-04-25: qty 5

## 2020-04-25 MED ORDER — POTASSIUM CHLORIDE 10 MEQ/50ML IV SOLN
10.0000 meq | INTRAVENOUS | Status: AC
  Administered 2020-04-25 (×6): 10 meq via INTRAVENOUS
  Filled 2020-04-25 (×6): qty 50

## 2020-04-25 MED ORDER — LACTATED RINGERS IV SOLN: INTRAVENOUS | Status: DC

## 2020-04-25 MED ORDER — HYDROMORPHONE HCL 1 MG/ML IJ SOLN
INTRAMUSCULAR | Status: DC | PRN
Start: 2020-04-25 — End: 2020-04-25
  Administered 2020-04-25: .5 mg via INTRAVENOUS

## 2020-04-25 MED ORDER — ONDANSETRON HCL 4 MG/2ML IJ SOLN: 4.0000 mg | Freq: Once | INTRAMUSCULAR | Status: DC | PRN

## 2020-04-25 MED ORDER — SODIUM CHLORIDE (PF) 0.9 % IJ SOLN
INTRAMUSCULAR | Status: AC
  Filled 2020-04-25: qty 20

## 2020-04-25 MED ORDER — LIVING WELL WITH DIABETES BOOK
Freq: Once | Status: AC
  Filled 2020-04-25 (×2): qty 1

## 2020-04-25 MED ORDER — ONDANSETRON HCL 4 MG/2ML IJ SOLN
INTRAMUSCULAR | Status: DC | PRN
  Administered 2020-04-25: 4 mg via INTRAVENOUS

## 2020-04-25 MED ORDER — SUGAMMADEX SODIUM 200 MG/2ML IV SOLN
INTRAVENOUS | Status: DC | PRN
  Administered 2020-04-25: 300 mg via INTRAVENOUS

## 2020-04-25 MED ORDER — PROPOFOL 10 MG/ML IV BOLUS
INTRAVENOUS | Status: AC
  Filled 2020-04-25: qty 20

## 2020-04-25 MED ORDER — PHENYLEPHRINE HCL (PRESSORS) 10 MG/ML IV SOLN
INTRAVENOUS | Status: AC
  Filled 2020-04-25: qty 1

## 2020-04-25 MED ORDER — INSULIN GLARGINE 100 UNIT/ML ~~LOC~~ SOLN
26.0000 [IU] | Freq: Every day | SUBCUTANEOUS | Status: DC
  Administered 2020-04-25 – 2020-04-26 (×2): 26 [IU] via SUBCUTANEOUS
  Filled 2020-04-25 (×2): qty 0.26

## 2020-04-25 MED ORDER — POTASSIUM CHLORIDE CRYS ER 20 MEQ PO TBCR
20.0000 meq | EXTENDED_RELEASE_TABLET | Freq: Once | ORAL | Status: AC
  Administered 2020-04-25: 20 meq via ORAL
  Filled 2020-04-25: qty 1

## 2020-04-25 MED ORDER — FENTANYL CITRATE (PF) 100 MCG/2ML IJ SOLN
INTRAMUSCULAR | Status: DC | PRN
  Administered 2020-04-25: 25 ug via INTRAVENOUS
  Administered 2020-04-25: 75 ug via INTRAVENOUS

## 2020-04-25 SURGICAL SUPPLY — 57 items
BLADE HEX COATED 2.75 (ELECTRODE) ×1 IMPLANT
BLADE SURG SZ10 CARB STEEL (BLADE) ×3 IMPLANT
BNDG COHESIVE 1X5 TAN STRL LF (GAUZE/BANDAGES/DRESSINGS) IMPLANT
BNDG COHESIVE 4X5 TAN STRL (GAUZE/BANDAGES/DRESSINGS) ×1 IMPLANT
BNDG COHESIVE 6X5 TAN STRL LF (GAUZE/BANDAGES/DRESSINGS) ×2 IMPLANT
BNDG CONFORM 3 STRL LF (GAUZE/BANDAGES/DRESSINGS) IMPLANT
BNDG ELASTIC 3X5.8 VLCR STR LF (GAUZE/BANDAGES/DRESSINGS) IMPLANT
BNDG ELASTIC 6X5.8 VLCR STR LF (GAUZE/BANDAGES/DRESSINGS) ×3 IMPLANT
CORD BIPOLAR FORCEPS 12FT (ELECTRODE) IMPLANT
COVER WAND RF STERILE (DRAPES) IMPLANT
CUFF TOURN SGL QUICK 24 (TOURNIQUET CUFF)
CUFF TOURN SGL QUICK 34 (TOURNIQUET CUFF)
CUFF TOURN SGL QUICK 42 (TOURNIQUET CUFF) IMPLANT
CUFF TRNQT CYL 24X4X16.5-23 (TOURNIQUET CUFF) IMPLANT
CUFF TRNQT CYL 34X4.125X (TOURNIQUET CUFF) ×2 IMPLANT
DRAPE BILATERAL LIMB T (DRAPES) IMPLANT
DRAPE IMP U-DRAPE 54X76 (DRAPES) IMPLANT
DRAPE INCISE IOBAN 66X45 STRL (DRAPES) ×6 IMPLANT
DRAPE U-SHAPE 47X51 STRL (DRAPES) ×3 IMPLANT
DRSG PAD ABDOMINAL 8X10 ST (GAUZE/BANDAGES/DRESSINGS) ×5 IMPLANT
DURAPREP 26ML APPLICATOR (WOUND CARE) ×5 IMPLANT
ELECT REM PT RETURN 15FT ADLT (MISCELLANEOUS) ×2 IMPLANT
FACESHIELD WRAPAROUND (MASK) IMPLANT
FACESHIELD WRAPAROUND OR TEAM (MASK) IMPLANT
GAUZE SPONGE 4X4 12PLY STRL (GAUZE/BANDAGES/DRESSINGS) ×4 IMPLANT
GAUZE XEROFORM 1X8 LF (GAUZE/BANDAGES/DRESSINGS) ×1 IMPLANT
GAUZE XEROFORM 5X9 LF (GAUZE/BANDAGES/DRESSINGS) ×3 IMPLANT
GLOVE SURG SS PI 7.5 STRL IVOR (GLOVE) ×14 IMPLANT
GOWN STRL REUS W/ TWL LRG LVL3 (GOWN DISPOSABLE) ×1 IMPLANT
GOWN STRL REUS W/ TWL XL LVL3 (GOWN DISPOSABLE) ×1 IMPLANT
GOWN STRL REUS W/TWL LRG LVL3 (GOWN DISPOSABLE) ×3
GOWN STRL REUS W/TWL XL LVL3 (GOWN DISPOSABLE) ×3
HANDPIECE INTERPULSE COAX TIP (DISPOSABLE)
KIT BASIN OR (CUSTOM PROCEDURE TRAY) ×3 IMPLANT
KIT TURNOVER KIT A (KITS) ×2 IMPLANT
MANIFOLD NEPTUNE II (INSTRUMENTS) ×3 IMPLANT
NS IRRIG 1000ML POUR BTL (IV SOLUTION) ×4 IMPLANT
PACK ORTHO EXTREMITY (CUSTOM PROCEDURE TRAY) ×3 IMPLANT
PADDING CAST COTTON 6X4 STRL (CAST SUPPLIES) ×1 IMPLANT
PENCIL SMOKE EVACUATOR (MISCELLANEOUS) IMPLANT
PROTECTOR NERVE ULNAR (MISCELLANEOUS) ×2 IMPLANT
SET HNDPC FAN SPRY TIP SCT (DISPOSABLE) IMPLANT
SET IRRIG Y TYPE TUR BLADDER L (SET/KITS/TRAYS/PACK) ×3 IMPLANT
SPONGE LAP 18X18 RF (DISPOSABLE) ×2 IMPLANT
STOCKINETTE 8 INCH (MISCELLANEOUS) ×3 IMPLANT
SUT ETHILON 2 0 PSLX (SUTURE) ×3 IMPLANT
SUT ETHILON 3 0 PS 1 (SUTURE) ×2 IMPLANT
SUT VIC AB 2-0 CT1 36 (SUTURE) ×1 IMPLANT
SUT VIC AB 2-0 FS1 27 (SUTURE) ×6 IMPLANT
SWAB CULTURE ESWAB REG 1ML (MISCELLANEOUS) ×2 IMPLANT
SYR CONTROL 10ML LL (SYRINGE) IMPLANT
TOWEL OR 17X26 10 PK STRL BLUE (TOWEL DISPOSABLE) ×4 IMPLANT
TUBE FEEDING ENTERAL 5FR 16IN (TUBING) IMPLANT
TUBING CONNECTING 10 (TUBING) ×1 IMPLANT
TUBING CONNECTING 10' (TUBING)
UNDERPAD 30X36 HEAVY ABSORB (UNDERPADS AND DIAPERS) ×2 IMPLANT
YANKAUER SUCT BULB TIP NO VENT (SUCTIONS) ×3 IMPLANT

## 2020-04-25 NOTE — Anesthesia Preprocedure Evaluation (Signed)
Anesthesia Evaluation  Patient identified by MRN, date of birth, ID band Patient awake    Reviewed: Allergy & Precautions, NPO status , Patient's Chart, lab work & pertinent test results  Airway Mallampati: II  TM Distance: >3 FB Neck ROM: Full    Dental no notable dental hx. (+) Teeth Intact   Pulmonary neg pulmonary ROS,    Pulmonary exam normal breath sounds clear to auscultation       Cardiovascular negative cardio ROS Normal cardiovascular exam Rhythm:Regular Rate:Normal     Neuro/Psych negative neurological ROS  negative psych ROS   GI/Hepatic negative GI ROS, Neg liver ROS,   Endo/Other  diabetes, Poorly Controlled, Type 2Obesity Recent DKA due to sepsis  Renal/GU Renal InsufficiencyRenal diseaseAKI  negative genitourinary   Musculoskeletal Right Shoulder Abscess   Abdominal (+) + obese,   Peds  Hematology  (+) anemia , Severe Sepsis   Anesthesia Other Findings   Reproductive/Obstetrics                             Anesthesia Physical Anesthesia Plan  ASA: III  Anesthesia Plan: General   Post-op Pain Management:    Induction: Intravenous  PONV Risk Score and Plan: 3 and Midazolam, Ondansetron and Treatment may vary due to age or medical condition  Airway Management Planned: Oral ETT  Additional Equipment:   Intra-op Plan:   Post-operative Plan: Extubation in OR  Informed Consent: I have reviewed the patients History and Physical, chart, labs and discussed the procedure including the risks, benefits and alternatives for the proposed anesthesia with the patient or authorized representative who has indicated his/her understanding and acceptance.     Dental advisory given  Plan Discussed with: CRNA and Anesthesiologist  Anesthesia Plan Comments:         Anesthesia Quick Evaluation

## 2020-04-25 NOTE — Progress Notes (Signed)
NAME:  Jonathan Shaffer, MRN:  [id number], DOB:  [birth year], LOS: 3 ADMISSION DATE:  05/01/2020, CONSULTATION DATE:  04/23/20 REFERRING MD:  Karleen Hampshire, CHIEF COMPLAINT:  Septic shock  Brief History   MSSA bacteremia +/- AC septic arthritis, septic emboli to spleen  History of present illness   Jonathan Shaffer is a 54 y/o gentleman admitted with sepsis, found to have MSSA bacteremia. He developed sudden onset nausea and vomiting and chills last week and later developed fevers and R shoulder pain. He was evaluated and felt to have a sprain. He was treated with an OP steroid injection and started oral steroids. He developed a painful rash on his hand. His progressive malaise, fevers, rash, and joint pain prompted return to the ED. At presentation he was febrile, tachycardic, tachypneic, with lactic acidosis. He was in DKA at presentation. He has a previous history of "high diabetes markers", which he reports he was not following up on. He was started on empiric vanc, zosyn & cefepime, and doxycycline. He has been evaluated by orthopaedics for possible septic arthritis of his R AC joint. ID has been consulted for MSSA+ blood cultures. No known history of IVDU or injuries prior to becoming ill. He has been encephalopathic this admission, prompting concern for septic emboli to the brain; MRI pending.  Past Medical History  Uncontrolled DM Testicular cancer; in remission HLD  Significant Hospital Events   Started vasopressors 11/20  Consults:  ID Ortho PCCM  Procedures:  PICC   Significant Diagnostic Tests:  MRI R shoulder> septic arthritis AC joint, possible intramuscular abscess, OM distal clavicle Echocardiogram 11/19> LVEF 55 to 60%, normal valves. LUE US> no DVT MRI left wrist> cellulitis, no septic arthritis  Micro Data:  Blood culture 11/19> MSSA  2/3 Urine culture 11/19> staph aureus Blood culture 11/21>  Antimicrobials:  Vanc 11/20 Zosyn 11/19 Cefepime 11/19- 11/20 Doxycycline  11/19 Nafcillin 11/20>  Interim history/subjective:  He denies new complaints. He remains sore diffusely.  Objective   Blood pressure 109/63, pulse (!) 106, temperature 99.8 F (37.7 C), temperature source Oral, resp. rate (!) 31, height 6\' 3"  (1.905 m), weight 126.8 kg, SpO2 96 %.        Intake/Output Summary (Last 24 hours) at 04/17/2020 0717 Last data filed at 04/07/2020 8341 Gross per 24 hour  Intake 1423.42 ml  Output 3400 ml  Net -1976.58 ml   Filed Weights   05/03/2020 0124 04/25/2020 0635  Weight: 117 kg 126.8 kg    Examination: General: Ill-appearing man lying in bed no acute distress HENT: [place]/AT, eyes anicteric Lungs: Breathing comfortably in room air, tachypnea. Cardiovascular: Tachycardic, regular rhythm Abdomen: Soft, nontender, nondistended Extremities: Improving erythema on left wrist.  Dependent pitting edema bilateral lower extremities. Neuro: Fatigued hearing, arouses from sleep but falls back asleep easily.  Answering questions appropriately.  CAM ICU positive. 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 Echocardiogram without abnormalities, but concern for endocarditis with splenic hemorrhages and janeway lesions. Incomplete source control with AC septic arthritis. -Appreciate ID's assistance; con't nafcillin. -repeated blood cultures tomorrow morning.  Continue to follow previous cultures.  Needs 2 - sets of blood cultures. -PICC will need to be removed when able to come off vasopressors; will need a new one placed after short line holiday if continuing nafcillin. -Eventually needs TEE for  further cardiac evaluation -OR today for [facility] joint washout  AKI vs CKD IIIa-stable -Renally dose medications, avoid nephrotoxic meds -Greater than 65 for  adequate renal perfusion. -Strict I/O  Hypokalemia -Replete with oral and IV -Continue to monitor  Hyponatremia-stable -Continue to monitor  Uncontrolled DM with hyperglycemia; prev in DKA. A1c 11.3 -basal bolus insulin; increase glargine again today -SSI QID PRN  -goal BG 140-180 while critically ill; will hold off on insulin drip for now as his glucose control is improving with subcutaneous insulin. -needs OP DM education and much more strict control  Acute anemia; suspect this is mixed due to critical illness, dilution -con't to monitor -transfuse for Hb <7 or hemodynamically significant bleeding  Hyperbilirubinemia likely 2/2 sepsis, mild coagulopathy -con't to monitor  Wrist pain due to cellulitis -MRI reviewed- con't antibiotics. Likely no surgical intervention required.  Delirium- multifactorial due to sepsis, sleep disruption, ICU delirium -Out of bed mobility as able -Prioritize appropriate sleep-wake cycles  Best practice:  Diet: NPO Pain/Anxiety/Delirium protocol (if indicated): fentanyl PRN VAP protocol (if indicated): n/a DVT prophylaxis: lovenox GI prophylaxis: n/a Glucose control: basal bolus + SSI Mobility: progressive Code Status: full Family Communication: wife updated at bedside 11/22 Disposition: ICU  Labs   CBC: Recent Labs  Lab 04/19/2020 0202 05/03/2020 0204 04/23/20 0537 04/24/20 0600  WBC 21.3*  --  16.3* 14.8*  NEUTROABS 19.5*  --   --   --   HGB 14.0 15.6 11.0* 10.8*  HCT 41.6 46.0 32.8* 32.1*  MCV 89.1  --  89.4 89.4  PLT 191  --  151 408    Basic Metabolic Panel: Recent Labs  Lab 04/23/20 0537 04/23/20 1800 04/24/20 0600 04/24/20 1743 04/18/2020 0500  NA 134* 130* 130* 129* 131*  K 2.8* 3.6 3.4* 3.7 3.1*  CL 103 101 100 98 100  CO2 22 16* 19* 20* 22  GLUCOSE 173* 328* 333* 320* 237*  BUN 25* 26* 25* 27* 26*  CREATININE 1.34* 1.38* 1.44* 1.40* 1.41*  CALCIUM 7.4* 7.5* 7.1* 7.2* 7.1*  MG 2.0  --   --   --   --     GFR: Estimated Creatinine Clearance: 85.9 mL/min (A) (by C-G formula based on SCr of 1.41 mg/dL (H)). Recent Labs  Lab 04/20/2020 0202 04/04/2020 0405 04/21/2020 2200 04/23/20 0537 04/23/20 1345 04/23/20 1701 04/23/20 2015 04/24/20 0600  WBC 21.3*  --   --  16.3*  --   --   --  14.8*  LATICACIDVEN 3.3*   < > 1.4  --  9.0* 1.3 1.4  --    < > = values in this interval not displayed.    Liver Function Tests: Recent Labs  Lab 05/02/2020 0202 04/23/20 0537 04/24/2020 0500  AST 40 41 24  ALT 48* 32 36  ALKPHOS 133* 75 91  BILITOT 2.0* 1.5* 2.3*  PROT 6.4* 4.9* 5.2*  ALBUMIN 2.4* 1.7* 1.6*   Recent Labs  Lab 04/09/2020 0202  LIPASE 35   Recent Labs  Lab 04/21/2020 0202  AMMONIA 23    ABG    Component Value Date/Time   HCO3 18.0 (L) 04/23/2020 0204   TCO2 19 (L) 04/21/2020 0204   ACIDBASEDEF 7.0 (H) 04/29/2020 0204   O2SAT 50.0 04/10/2020 0204     Coagulation Profile: Recent Labs  Lab 04/15/2020 0202  INR 1.3*    Cardiac Enzymes: Recent Labs  Lab 04/19/2020 0202  CKTOTAL 70    HbA1C: Hgb A1c MFr Bld  Date/Time Value Ref Range  Status  04/10/2020 10:17 AM 11.3 (H) 4.8 - 5.6 % Final    Comment:    (NOTE) Pre diabetes:          5.7%-6.4%  Diabetes:              >6.4%  Glycemic control for   <7.0% adults with diabetes     CBG: Recent Labs  Lab 04/23/20 2056 04/23/20 2152 04/24/20 0801 04/24/20 1620 04/24/20 2223  GLUCAP 338* 296* 311* 301* 249*   This patient is critically ill with multiple organ system failure which requires frequent high complexity decision making, assessment, support, evaluation, and titration of therapies. This was completed through the application of advanced monitoring technologies and extensive interpretation of multiple databases. During this encounter critical care time was devoted to patient care services described in this note for 38 minutes.   Julian Hy, DO 04/26/2020 9:49 AM [facility]

## 2020-04-25 NOTE — Anesthesia Postprocedure Evaluation (Signed)
Anesthesia Post Note  Patient: Jonathan Shaffer  Procedure(s) Performed: MINOR IRRIGATION AND DEBRIDEMENT OF WOUND (Right )     Patient location during evaluation: PACU Anesthesia Type: General Level of consciousness: awake and alert and oriented Pain management: pain level controlled Vital Signs Assessment: post-procedure vital signs reviewed and stable Respiratory status: spontaneous breathing, nonlabored ventilation, respiratory function stable and patient connected to nasal cannula oxygen Cardiovascular status: blood pressure returned to baseline and stable Postop Assessment: no apparent nausea or vomiting Anesthetic complications: no   No complications documented.  Last Vitals:  Vitals:   04/27/2020 1530 04/29/2020 1545  BP: (!) 145/86 (!) 142/95  Pulse: (!) 109 (!) 108  Resp: (!) 34 (!) 26  Temp:    SpO2: 100% 98%    Last Pain:  Vitals:   04/26/2020 1527  TempSrc:   PainSc: Asleep                 Marisol Glazer A.

## 2020-04-25 NOTE — H&P (Signed)

## 2020-04-25 NOTE — Op Note (Signed)
   Date of Surgery: 04/26/2020  INDICATIONS: Jonathan Shaffer is a 54 y.o.-year-old male with a right shoulder abscess.  The patient did consent to the procedure after discussion of the risks and benefits.  PREOPERATIVE DIAGNOSIS:  1.  Septic arthritis of right acromioclavicular joint 2.  Deep abscess right shoulder region  POSTOPERATIVE DIAGNOSIS: Same.  PROCEDURE:  1.  Irrigation and debridement of right acromioclavicular joint including distal clavicle, acromion 2.  Incision and drainage deep abscess of right shoulder region  SURGEON: N. Eduard Roux, M.D.  ASSIST: Ciro Backer [place], [place]; necessary for the timely completion of procedure and due to complexity of procedure.  ANESTHESIA:  general  IV FLUIDS AND URINE: See anesthesia.  ESTIMATED BLOOD LOSS: 50 mL.  IMPLANTS: None  DRAINS: Penrose  COMPLICATIONS: see description of procedure.  DESCRIPTION OF PROCEDURE: The patient was brought to the operating room.  The patient had been signed prior to the procedure and this was documented. The patient had the anesthesia placed by the anesthesiologist.  A time-out was performed to confirm that this was the correct patient, site, side and location. The patient did receive antibiotics prior to the incision and was re-dosed during the procedure as needed at indicated intervals. The patient had the operative extremity prepped and draped in the standard surgical fashion.    An incision approximately 5 cm long was made directly over the fluctuant and cellulitic region over the anterior aspect of the right shoulder.  Sharp dissection was carried down through the dermis and into the subcutaneous tissue which returned frank pus.  Fluid and tissue cultures were obtained.  Sharp excisional debridement was performed using a rondure in the subcutaneous and muscular spaces.  There was extensive necrotic and infected tissue.  All of this was debrided away back to healthy bleeding tissue.  Continue to  trace the infection down into the [facility] joint.  The [facility] joint was opened up sharply and the distal clavicle and portion of the medial acromion were sharply debrided away using a rondure.  After thorough debridement the entire surgical wound was thoroughly irrigated with normal saline.  Hemostasis was obtained.  The wound was closed over a Penrose drain.  Sterile dressings were applied.  Patient tolerated procedure well had no immediate complications.  POSTOPERATIVE PLAN: Activity and range of motion of the right upper extremity as tolerated.  We will plan to pull the Penrose drain in a few days.  Azucena Cecil, MD 3:11 PM

## 2020-04-25 NOTE — TOC Progression Note (Signed)
Transition of Care [facility]) - Progression Note    Patient Details  Name: Jonathan Shaffer MRN: [id number] Date of Birth: [birth year]  Transition of Care [facility]) CM/SW Contact  Leeroy Cha, RN Phone Number: 04/12/2020, 9:09 AM  Clinical Narrative:    54 y/o gentleman admitted with sepsis, found to have MSSA bacteremia. He developed sudden onset nausea and vomiting and chills last week and later developed fevers and R shoulder pain. He was evaluated and felt to have a sprain. He was treated with an OP steroid injection and started oral steroids. He developed a painful rash on his hand. His progressive malaise, fevers, rash, and joint pain prompted return to the ED. At presentation he was febrile, tachycardic, tachypneic, with lactic acidosis. He was in DKA at presentation. He has a previous history of "high diabetes markers", which he reports he was not following up on. He was started on empiric vanc, zosyn & cefepime, and doxycycline. He has been evaluated by orthopaedics for possible septic arthritis of his R AC joint. ID has been consulted for MSSA+ blood cultures. No known history of IVDU or injuries prior to becoming ill. He has been encephalopathic this admission, prompting concern for septic emboli to the brain; MRI pending.  Past Medical History  Uncontrolled DM Testicular cancer; in remission HLD Temp 100.7, hypotension, Iv levophed, kcl 44mEq in cline x6 runs, na -131, K3.1, bun @^ creat 1.412 Plan is to return to home Following for progression.   Expected Discharge Plan: Home/Self Care Barriers to Discharge: No Barriers Identified  Expected Discharge Plan and Services Expected Discharge Plan: Home/Self Care   Discharge Planning Services: CM Consult   Living arrangements for the past 2 months: Single Family Home                                       Social Determinants of Health (SDOH) Interventions    Readmission Risk Interventions No flowsheet data  found.

## 2020-04-25 NOTE — Transfer of Care (Signed)
Immediate Anesthesia Transfer of Care Note  Patient: Jonathan Shaffer  Procedure(s) Performed: MINOR IRRIGATION AND DEBRIDEMENT OF WOUND (Right )  Patient Location: PACU  Anesthesia Type:General  Level of Consciousness: drowsy  Airway & Oxygen Therapy: Patient Spontanous Breathing and Patient connected to face mask oxygen  Post-op Assessment: Report given to RN and Post -op Vital signs reviewed and stable  Post vital signs: Reviewed and stable  Last Vitals:  Vitals Value Taken Time  BP 150/86 04/07/2020 1527  Temp    Pulse 109 05/02/2020 1530  Resp 34 05/02/2020 1530  SpO2 100 % 05/03/2020 1530  Vitals shown include unvalidated device data.  Last Pain:  Vitals:   04/04/2020 1243  TempSrc: Oral  PainSc:       Patients Stated Pain Goal: 4 (59/13/68 5992)  Complications: No complications documented.

## 2020-04-25 NOTE — Anesthesia Procedure Notes (Signed)
Procedure Name: Intubation Date/Time: 04/20/2020 2:24 PM Performed by: Sharlette Dense, CRNA Patient Re-evaluated:Patient Re-evaluated prior to induction Oxygen Delivery Method: Circle system utilized Preoxygenation: Pre-oxygenation with 100% oxygen Induction Type: IV induction Ventilation: Mask ventilation without difficulty and Oral airway inserted - appropriate to patient size Laryngoscope Size: Miller and 3 Grade View: Grade I Tube type: Oral Tube size: 7.5 mm Number of attempts: 1 Airway Equipment and Method: Stylet Placement Confirmation: ETT inserted through vocal cords under direct vision,  positive ETCO2 and breath sounds checked- equal and bilateral Secured at: 22 cm Tube secured with: Tape Dental Injury: Teeth and Oropharynx as per pre-operative assessment

## 2020-04-26 ENCOUNTER — Encounter (HOSPITAL_COMMUNITY): Payer: Self-pay | Admitting: Orthopaedic Surgery

## 2020-04-26 DIAGNOSIS — D735 Infarction of spleen: Secondary | ICD-10-CM | POA: Diagnosis not present

## 2020-04-26 DIAGNOSIS — N179 Acute kidney failure, unspecified: Secondary | ICD-10-CM | POA: Diagnosis not present

## 2020-04-26 DIAGNOSIS — L02413 Cutaneous abscess of right upper limb: Secondary | ICD-10-CM | POA: Diagnosis not present

## 2020-04-26 DIAGNOSIS — E1310 Other specified diabetes mellitus with ketoacidosis without coma: Secondary | ICD-10-CM

## 2020-04-26 DIAGNOSIS — A419 Sepsis, unspecified organism: Secondary | ICD-10-CM | POA: Diagnosis not present

## 2020-04-26 DIAGNOSIS — R6520 Severe sepsis without septic shock: Secondary | ICD-10-CM | POA: Diagnosis not present

## 2020-04-26 DIAGNOSIS — I6310 Cerebral infarction due to embolism of unspecified precerebral artery: Secondary | ICD-10-CM

## 2020-04-26 DIAGNOSIS — A4101 Sepsis due to Methicillin susceptible Staphylococcus aureus: Secondary | ICD-10-CM | POA: Diagnosis not present

## 2020-04-26 LAB — BLOOD CULTURE ID PANEL (REFLEXED) - BCID2

## 2020-04-26 LAB — BASIC METABOLIC PANEL
Anion gap: 10 (ref 5–15)
BUN: 22 mg/dL — ABNORMAL HIGH (ref 6–20)
CO2: 20 mmol/L — ABNORMAL LOW (ref 22–32)
Calcium: 7 mg/dL — ABNORMAL LOW (ref 8.9–10.3)
Chloride: 101 mmol/L (ref 98–111)
Creatinine, Ser: 1.25 mg/dL — ABNORMAL HIGH (ref 0.61–1.24)
GFR, Estimated: >60 mL/min (ref >60)
Glucose, Bld: 218 mg/dL — ABNORMAL HIGH (ref 70–99)
Potassium: 3.5 mmol/L (ref 3.5–5.1)
Sodium: 131 mmol/L — ABNORMAL LOW (ref 135–145)

## 2020-04-26 LAB — CBC
HCT: 32.1 % — ABNORMAL LOW (ref 39.0–52.0)
Hemoglobin: 10.5 g/dL — ABNORMAL LOW (ref 13.0–17.0)
MCH: 30 pg (ref 26.0–34.0)
MCHC: 32.7 g/dL (ref 30.0–36.0)
MCV: 91.7 fL (ref 80.0–100.0)
Platelets: 281 10*3/uL (ref 150–400)
RBC: 3.5 MIL/uL — ABNORMAL LOW (ref 4.22–5.81)
RDW: 15.4 % (ref 11.5–15.5)
WBC: 15 10*3/uL — ABNORMAL HIGH (ref 4.0–10.5)
nRBC: 0 % (ref 0.0–0.2)

## 2020-04-26 LAB — GLUCOSE, CAPILLARY
Glucose-Capillary: 142 mg/dL — ABNORMAL HIGH (ref 70–99)
Glucose-Capillary: 194 mg/dL — ABNORMAL HIGH (ref 70–99)
Glucose-Capillary: 151 mg/dL — ABNORMAL HIGH (ref 70–99)
Glucose-Capillary: 110 mg/dL — ABNORMAL HIGH (ref 70–99)

## 2020-04-26 LAB — ROCKY MTN SPOTTED FVR ABS PNL(IGG+IGM)
RMSF IgG: NEGATIVE
RMSF IgM: 0.36 index (ref 0.00–0.89)

## 2020-04-26 MED ORDER — INSULIN ASPART 100 UNIT/ML ~~LOC~~ SOLN
7.0000 [IU] | Freq: Three times a day (TID) | SUBCUTANEOUS | Status: DC
  Administered 2020-04-26 – 2020-04-29 (×8): 7 [IU] via SUBCUTANEOUS

## 2020-04-26 MED ORDER — ENOXAPARIN SODIUM 60 MG/0.6ML ~~LOC~~ SOLN
60.0000 mg | SUBCUTANEOUS | Status: DC
  Administered 2020-04-26 – 2020-04-29 (×4): 60 mg via SUBCUTANEOUS
  Filled 2020-04-26 (×5): qty 0.6

## 2020-04-26 MED ORDER — INSULIN GLARGINE 100 UNIT/ML ~~LOC~~ SOLN
30.0000 [IU] | Freq: Every day | SUBCUTANEOUS | Status: DC
  Administered 2020-04-28 – 2020-04-29 (×2): 30 [IU] via SUBCUTANEOUS
  Filled 2020-04-26 (×5): qty 0.3

## 2020-04-26 MED ORDER — POTASSIUM CHLORIDE CRYS ER 20 MEQ PO TBCR
40.0000 meq | EXTENDED_RELEASE_TABLET | Freq: Once | ORAL | Status: AC
  Administered 2020-04-26: 40 meq via ORAL
  Filled 2020-04-26: qty 2

## 2020-04-26 NOTE — Progress Notes (Signed)
Spoke with Stanton Kidney, [place] (rm [phone]) regarding pt's TEE scheduled at [facility] 04/05/2020. Mary aware that pt is scheduled at 1400 and will need a signed consent sent with the pt for the procedure. [facility] transport set up for pt to be at [place] by 1300. Jobe Igo, RN

## 2020-04-26 NOTE — TOC Progression Note (Signed)
Transition of Care [facility]) - Progression Note    Patient Details  Name: Jonathan Shaffer MRN: [id number] Date of Birth: [birth year]  Transition of Care [facility]) CM/SW Contact  Leeroy Cha, RN Phone Number: 04/26/2020, 7:52 AM  Clinical Narrative:    1 Day Post-Op Procedure(s) (LRB): MINOR IRRIGATION AND DEBRIDEMENT OF WOUND (Right) drain in place, iv nafcillin and iv levo on going, [place] at 2l/min, Plan is to return to home Following for progression.  Expected Discharge Plan: Home/Self Care Barriers to Discharge: No Barriers Identified  Expected Discharge Plan and Services Expected Discharge Plan: Home/Self Care   Discharge Planning Services: CM Consult   Living arrangements for the past 2 months: Single Family Home                                       Social Determinants of Health (SDOH) Interventions    Readmission Risk Interventions No flowsheet data found.

## 2020-04-26 NOTE — Progress Notes (Addendum)
NAME:  Jonathan Shaffer, MRN:  [id number], DOB:  1965/12/18, LOS: 4 ADMISSION DATE:  04/04/2020, CONSULTATION DATE:  04/23/20 REFERRING MD:  Karleen Hampshire, CHIEF COMPLAINT:  Septic shock  Brief History   MSSA bacteremia +/- AC septic arthritis, septic emboli to spleen  History of present illness   Jonathan Shaffer is a 54 y/o gentleman admitted with sepsis, found to have MSSA bacteremia. He developed sudden onset nausea and vomiting and chills last week and later developed fevers and R shoulder pain. He was evaluated and felt to have a sprain. He was treated with an OP steroid injection and started oral steroids. He developed a painful rash on his hand. His progressive malaise, fevers, rash, and joint pain prompted return to the ED. At presentation he was febrile, tachycardic, tachypneic, with lactic acidosis. He was in DKA at presentation. He has a previous history of "high diabetes markers", which he reports he was not following up on. He was started on empiric vanc, zosyn & cefepime, and doxycycline. He has been evaluated by orthopaedics for possible septic arthritis of his R AC joint. ID has been consulted for MSSA+ blood cultures. No known history of IVDU or injuries prior to becoming ill. He has been encephalopathic this admission, prompting concern for septic emboli to the brain; MRI pending.  Past Medical History  Uncontrolled DM Testicular cancer; in remission HLD  Significant Hospital Events   Started vasopressors 11/20  Consults:  ID Ortho PCCM  Procedures:  PICC   Significant Diagnostic Tests:  MRI R shoulder> septic arthritis AC joint, possible intramuscular abscess, OM distal clavicle Echocardiogram 11/19> LVEF 55 to 60%, normal valves. LUE US> no DVT MRI left wrist> cellulitis, no septic arthritis  Micro Data:  Blood culture 11/19> MSSA  2/3 Urine culture 11/19> staph aureus Blood culture 11/21> Operative cultures 11/22> abundant GPC>  Antimicrobials:  Vanc 11/20 Zosyn  11/19 Cefepime 11/19- 11/20 Doxycycline 11/19 Nafcillin 11/20>  Interim history/subjective:  OR for washout yesterday of AC & overlying muscle abscess.  Objective   Blood pressure 124/76, pulse (!) 103, temperature 98.2 F (36.8 C), temperature source Oral, resp. rate (!) 33, height 6\' 3"  (1.905 m), weight 120.2 kg, SpO2 99 %.        Intake/Output Summary (Last 24 hours) at 04/26/2020 0717 Last data filed at 04/26/2020 7253 Gross per 24 hour  Intake 2082.76 ml  Output 3225 ml  Net -1142.24 ml   Filed Weights   05/03/2020 0124 04/24/2020 0635 04/25/20 1239  Weight: 117 kg 126.8 kg 120.2 kg    Examination: General: ill appearing middle aged man laying in bed in NAD HENT: [place]/AT, eyes anicteric Lungs: Breathing comfortably on RA. CTAB. Speaking in full sentences. Tachypnea. Cardiovascular: Tachycardic, reg rhythm Abdomen: soft, ND, ND Extremities: Right shoulder bandaged. Left wrist erythematous, warm. Neuro: sleeping but arouses easily. Answers questions appropriately, normal speech. Weak but moving all extremities. Derm: splinter hemorrhages persist, no new rashes. No erythema around previous RUE PICC site.  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 Echocardiogram without abnormalities, but concern for endocarditis with splenic hemorrhages, embolic strokes, and janeway lesions. Incomplete source control with AC septic arthritis. -Appreciate ID's assistance; con't nafcillin. -Repeated blood cultures this morning.  Continue to follow previous cultures.  Needs 2 negative sets of blood cultures. -PICC line removed- on line holiday currently. -Consult to Cardiology for TEE.  TCTS consult for left sided endocarditis placed & called in.  -appreciate Ortho's management   AKI - improving -Renally dose medications, avoid  nephrotoxic meds -Maintain MAP  >65 for adequate renal perfusion. -Strict I/Os  Hypokalemia, resolved -Continue to monitor  Hyponatremia-stable -Continue to monitor  Uncontrolled DM with hyperglycemia; prev in DKA. A1c 11.3 -basal bolus insulin; increase glargine to 30 units, increase mealtime coverage to 7 units TIDAC -SSI QID PRN  -goal BG 140-180 while critically ill; will hold off on insulin drip for now as his glucose control is improving with subcutaneous insulin. -needs OP DM education and much more strict control  Acute anemia; suspect this is mixed due to critical illness & dilution- stable -con't to monitor -transfuse for Hb <7 or hemodynamically significant bleeding  Hyperbilirubinemia likely 2/2 sepsis- worsened without increase in LFTs. Hb stable over past few days. -con't to monitor  Wrist pain due to cellulitis -antibiotics  Delirium- multifactorial due to sepsis, sleep disruption, ICU delirium -Out of bed mobility as able; PT consult -Prioritize appropriate sleep-wake cycles  Stable to SD to med tele. TRH will assume care tomorrow. PCCM will sign off. Please call with questions.  Best practice:  Diet: NPO Pain/Anxiety/Delirium protocol (if indicated): fentanyl PRN VAP protocol (if indicated): n/a DVT prophylaxis: lovenox GI prophylaxis: n/a Glucose control: basal bolus + SSI Mobility: progressive Code Status: full Family Communication: wife updated at bedside 11/23 Disposition: ICU  Labs   CBC: Recent Labs  Lab 04/28/2020 0202 04/29/2020 0204 04/23/20 0537 04/24/20 0600  WBC 21.3*  --  16.3* 14.8*  NEUTROABS 19.5*  --   --   --   HGB 14.0 15.6 11.0* 10.8*  HCT 41.6 46.0 32.8* 32.1*  MCV 89.1  --  89.4 89.4  PLT 191  --  151 481    Basic Metabolic Panel: Recent Labs  Lab 04/23/20 0537 04/23/20 1800 04/24/20 0600 04/24/20 1743 04/18/2020 0500  NA 134* 130* 130* 129* 131*  K 2.8* 3.6 3.4* 3.7 3.1*  CL 103 101 100 98 100  CO2 22 16* 19* 20*  22  GLUCOSE 173* 328* 333* 320* 237*  BUN 25* 26* 25* 27* 26*  CREATININE 1.34* 1.38* 1.44* 1.40* 1.41*  CALCIUM 7.4* 7.5* 7.1* 7.2* 7.1*  MG 2.0  --   --   --   --    GFR: Estimated Creatinine Clearance: 83.7 mL/min (A) (by C-G formula based on SCr of 1.41 mg/dL (H)). Recent Labs  Lab 04/30/2020 0202 04/26/2020 0405 04/27/2020 2200 04/23/20 0537 04/23/20 1345 04/23/20 1701 04/23/20 2015 04/24/20 0600  WBC 21.3*  --   --  16.3*  --   --   --  14.8*  LATICACIDVEN 3.3*   < > 1.4  --  9.0* 1.3 1.4  --    < > = values in this interval not displayed.    Liver Function Tests: Recent Labs  Lab 04/24/2020 0202 04/23/20 0537 04/07/2020 0500  AST 40 41 24  ALT 48* 32 36  ALKPHOS 133* 75 91  BILITOT 2.0* 1.5* 2.3*  PROT 6.4* 4.9* 5.2*  ALBUMIN 2.4* 1.7* 1.6*   Recent Labs  Lab 04/21/2020 0202  LIPASE 35   Recent Labs  Lab 04/19/2020 0202  AMMONIA 23    ABG    Component Value Date/Time   HCO3 18.0 (L) 04/07/2020 0204   TCO2 19 (L) 04/12/2020 0204   ACIDBASEDEF 7.0 (H) 04/21/2020 0204   O2SAT 50.0 04/30/2020 0204     Coagulation Profile: Recent  Labs  Lab 04/21/2020 0202  INR 1.3*    Cardiac Enzymes: Recent Labs  Lab 04/06/2020 0202  CKTOTAL 70    HbA1C: Hgb A1c MFr Bld  Date/Time Value Ref Range Status  04/20/2020 10:17 AM 11.3 (H) 4.8 - 5.6 % Final    Comment:    (NOTE) Pre diabetes:          5.7%-6.4%  Diabetes:              >6.4%  Glycemic control for   <7.0% adults with diabetes     CBG: Recent Labs  Lab 04/24/20 2223 04/29/2020 0736 04/26/2020 1208 04/29/2020 1531 04/13/2020 [place] Quanisha Drewry, DO 04/26/20 7:17 AM [facility]

## 2020-04-26 NOTE — Progress Notes (Signed)
Subjective: No new complaints   Antibiotics:  Anti-infectives (From admission, onward)   Start     Dose/Rate Route Frequency Ordered Stop   04/23/20 1200  nafcillin injection 2 g  Status:  Discontinued       Note to Pharmacy: Pharmacy may adjust as indicated   2 g Intravenous Every 4 hours 04/23/20 0954 04/23/20 1004   04/23/20 1200  nafcillin 2 g in sodium chloride 0.9 % 100 mL IVPB        2 g 200 mL/hr over 30 Minutes Intravenous Every 4 hours 04/23/20 1004     04/30/2020 1600  doxycycline (VIBRAMYCIN) 100 mg in sodium chloride 0.9 % 250 mL IVPB  Status:  Discontinued        100 mg 125 mL/hr over 120 Minutes Intravenous Every 12 hours 04/24/2020 1348 04/23/20 0954   04/25/2020 1500  vancomycin (VANCOCIN) IVPB 1000 mg/200 mL premix  Status:  Discontinued        1,000 mg 200 mL/hr over 60 Minutes Intravenous Every 12 hours 04/09/2020 1114 04/23/20 0954   04/26/2020 1200  ceFEPIme (MAXIPIME) 2 g in sodium chloride 0.9 % 100 mL IVPB  Status:  Discontinued        2 g 200 mL/hr over 30 Minutes Intravenous Every 8 hours 04/14/2020 0808 04/23/20 0954   05/01/2020 1000  vancomycin (VANCOREADY) IVPB 750 mg/150 mL  Status:  Discontinued        750 mg 150 mL/hr over 60 Minutes Intravenous Every 12 hours 04/28/2020 0547 04/12/2020 1114   04/26/2020 0200  ceFEPIme (MAXIPIME) 2 g in sodium chloride 0.9 % 100 mL IVPB        2 g 200 mL/hr over 30 Minutes Intravenous  Once 04/29/2020 0147 04/27/2020 0250   04/04/2020 0200  piperacillin-tazobactam (ZOSYN) IVPB 3.375 g        3.375 g 12.5 mL/hr over 240 Minutes Intravenous Once 04/14/2020 0147 04/27/2020 0359   05/01/2020 0200  vancomycin (VANCOCIN) IVPB 1000 mg/200 mL premix  Status:  Discontinued        1,000 mg 200 mL/hr over 60 Minutes Intravenous  Once 04/24/2020 0147 04/19/2020 0153   04/09/2020 0200  vancomycin (VANCOCIN) IVPB 1000 mg/200 mL premix        1,000 mg 200 mL/hr over 60 Minutes Intravenous Every 1 hr x 2 04/30/2020 0153 04/10/2020 0428       Medications: Scheduled Meds: . chlorhexidine  15 mL Mouth Rinse BID  . Chlorhexidine Gluconate Cloth  6 each Topical Daily  . insulin aspart  2-6 Units Subcutaneous TID WC  . insulin aspart  4 Units Subcutaneous TID WC  . insulin glargine  26 Units Subcutaneous Daily  . mouth rinse  15 mL Mouth Rinse q12n4p  . sodium chloride flush  10-40 mL Intracatheter Q12H   Continuous Infusions: . nafcillin IV Stopped (04/26/20 0913)  . norepinephrine (LEVOPHED) Adult infusion Stopped (04/24/20 0439)   PRN Meds:.acetaminophen **OR** acetaminophen, dextrose, fentaNYL (SUBLIMAZE) injection, lip balm, ondansetron **OR** ondansetron (ZOFRAN) IV, sodium chloride flush    Objective: Weight change:   Intake/Output Summary (Last 24 hours) at 04/26/2020 0954 Last data filed at 04/26/2020 7124 Gross per 24 hour  Intake 1882.76 ml  Output 3225 ml  Net -1342.24 ml   Blood pressure (!) 124/57, pulse (!) 103, temperature 99.9 F (37.7 C), temperature source Oral, resp. rate (!) 33, height 6\' 3"  (1.905 m), weight 120.2 kg, SpO2 99 %. Temp:  [97.7 F (36.5 C)-99.9  F (37.7 C)] 99.9 F (37.7 C) (11/23 0800) Pulse Rate:  [102-113] 103 (11/23 0600) Resp:  [14-36] 33 (11/23 0600) BP: (115-151)/(57-95) 124/57 (11/23 0800) SpO2:  [91 %-100 %] 99 % (11/23 0600) Weight:  [120.2 kg] 120.2 kg (11/22 1239)  Physical Exam: General: Alert and awake, oriented x3, not in any acute distress. HEENT: anicteric sclera, EOMI CVS regular rate, normal no mgr hear Chest: , no wheezing, no respiratory distress, lungs clear Abdomen: soft non-distended,   Right shoulder with dressing  Left arm cellulitic with stiff tender wrist  Lesions on fingers, hands 11/123/2021:            Neuro: nonfocal  CBC:    BMET Recent Labs    04/24/20 1743 04/21/2020 0500  NA 129* 131*  K 3.7 3.1*  CL 98 100  CO2 20* 22  GLUCOSE 320* 237*  BUN 27* 26*  CREATININE 1.40* 1.41*  CALCIUM 7.2* 7.1*      Liver Panel  Recent Labs    04/13/2020 0500  PROT 5.2*  ALBUMIN 1.6*  AST 24  ALT 36  ALKPHOS 91  BILITOT 2.3*       Sedimentation Rate No results for input(s): ESRSEDRATE in the last 72 hours. C-Reactive Protein No results for input(s): CRP in the last 72 hours.  Micro Results: Recent Results (from the past 720 hour(s))  Blood Culture (routine x 2)     Status: Abnormal   Collection Time: 04/21/2020  2:00 AM   Specimen: BLOOD RIGHT HAND  Result Value Ref Range Status   Specimen Description   Final    BLOOD RIGHT HAND Performed at [facility], [address] [place], [place] [address]    Special Requests   Final    BOTTLES DRAWN AEROBIC AND ANAEROBIC Blood Culture adequate volume Performed at [facility], [address] [place], [place] [address]    Culture  Setup Time   Final    GRAM POSITIVE COCCI IN BOTH AEROBIC AND ANAEROBIC BOTTLES CRITICAL RESULT CALLED TO, READ BACK BY AND VERIFIED WITHSeward Shaffer [facility] 2109 04/21/2020 A BROWNING Performed at [facility], [place] [address], [place],  [address]    Culture STAPHYLOCOCCUS AUREUS (A)  Final   Report Status 04/24/2020 FINAL  Final   Organism ID, Bacteria STAPHYLOCOCCUS AUREUS  Final      Susceptibility   Staphylococcus aureus - MIC*    CIPROFLOXACIN <=0.5 SENSITIVE Sensitive     ERYTHROMYCIN <=0.25 SENSITIVE Sensitive     GENTAMICIN <=0.5 SENSITIVE Sensitive     OXACILLIN 0.5 SENSITIVE Sensitive     TETRACYCLINE <=1 SENSITIVE Sensitive     VANCOMYCIN 1 SENSITIVE Sensitive     TRIMETH/SULFA <=10 SENSITIVE Sensitive     CLINDAMYCIN <=0.25 SENSITIVE Sensitive     RIFAMPIN <=0.5 SENSITIVE Sensitive     Inducible Clindamycin NEGATIVE Sensitive     * STAPHYLOCOCCUS AUREUS  Blood Culture ID Panel (Reflexed)     Status: Abnormal   Collection Time: 04/28/2020  2:00 AM  Result Value Ref Range Status   Enterococcus faecalis NOT DETECTED NOT DETECTED Final   Enterococcus Faecium  NOT DETECTED NOT DETECTED Final   Listeria monocytogenes NOT DETECTED NOT DETECTED Final   Staphylococcus species DETECTED (A) NOT DETECTED Final    Comment: CRITICAL RESULT CALLED TO, READ BACK BY AND VERIFIED WITH: Jonathan Shaffer [place] 2109 04/09/2020 A BROWNING    Staphylococcus aureus (BCID) DETECTED (A) NOT DETECTED Final  Comment: CRITICAL RESULT CALLED TO, READ BACK BY AND VERIFIED WITH: Jonathan Shaffer [address] 2109 04/28/2020 A BROWNING    Staphylococcus epidermidis NOT DETECTED NOT DETECTED Final   Staphylococcus lugdunensis NOT DETECTED NOT DETECTED Final   Streptococcus species NOT DETECTED NOT DETECTED Final   Streptococcus agalactiae NOT DETECTED NOT DETECTED Final   Streptococcus pneumoniae NOT DETECTED NOT DETECTED Final   Streptococcus pyogenes NOT DETECTED NOT DETECTED Final   A.calcoaceticus-baumannii NOT DETECTED NOT DETECTED Final   Bacteroides fragilis NOT DETECTED NOT DETECTED Final   Enterobacterales NOT DETECTED NOT DETECTED Final   Enterobacter cloacae complex NOT DETECTED NOT DETECTED Final   Escherichia coli NOT DETECTED NOT DETECTED Final   Klebsiella aerogenes NOT DETECTED NOT DETECTED Final   Klebsiella oxytoca NOT DETECTED NOT DETECTED Final   Klebsiella pneumoniae NOT DETECTED NOT DETECTED Final   Proteus species NOT DETECTED NOT DETECTED Final   Salmonella species NOT DETECTED NOT DETECTED Final   Serratia marcescens NOT DETECTED NOT DETECTED Final   Haemophilus influenzae NOT DETECTED NOT DETECTED Final   Neisseria meningitidis NOT DETECTED NOT DETECTED Final   Pseudomonas aeruginosa NOT DETECTED NOT DETECTED Final   Stenotrophomonas maltophilia NOT DETECTED NOT DETECTED Final   Candida albicans NOT DETECTED NOT DETECTED Final   Candida auris NOT DETECTED NOT DETECTED Final   Candida glabrata NOT DETECTED NOT DETECTED Final   Candida krusei NOT DETECTED NOT DETECTED Final   Candida parapsilosis NOT DETECTED NOT DETECTED Final   Candida tropicalis NOT  DETECTED NOT DETECTED Final   Cryptococcus neoformans/gattii NOT DETECTED NOT DETECTED Final   Shaffer resistant mecA/C and MREJ NOT DETECTED NOT DETECTED Final    Comment: Performed at [facility], [address] [place], [place] [address]  Urine culture     Status: Abnormal   Collection Time: 04/20/2020  2:02 AM   Specimen: In/Out Cath Urine  Result Value Ref Range Status   Specimen Description IN/OUT CATH URINE  Final   Special Requests NONE  Final   Culture 40,000 COLONIES/mL STAPHYLOCOCCUS AUREUS (A)  Final   Report Status 04/24/2020 FINAL  Final   Organism ID, Bacteria STAPHYLOCOCCUS AUREUS (A)  Final      Susceptibility   Staphylococcus aureus - MIC*    CIPROFLOXACIN <=0.5 SENSITIVE Sensitive     GENTAMICIN <=0.5 SENSITIVE Sensitive     NITROFURANTOIN 32 SENSITIVE Sensitive     OXACILLIN <=0.25 SENSITIVE Sensitive     TETRACYCLINE <=1 SENSITIVE Sensitive     VANCOMYCIN 1 SENSITIVE Sensitive     TRIMETH/SULFA <=10 SENSITIVE Sensitive     CLINDAMYCIN <=0.25 SENSITIVE Sensitive     RIFAMPIN <=0.5 SENSITIVE Sensitive     Inducible Clindamycin NEGATIVE Sensitive     * 40,000 COLONIES/mL STAPHYLOCOCCUS AUREUS  Resp Panel by RT-PCR (Flu A&B, Covid) Nasopharyngeal Swab     Status: None   Collection Time: 04/05/2020  2:05 AM   Specimen: Nasopharyngeal Swab; Nasopharyngeal(NP) swabs in vial transport medium  Result Value Ref Range Status   SARS Coronavirus 2 by RT PCR NEGATIVE NEGATIVE Final    Comment: (NOTE) SARS-CoV-2 target nucleic acids are NOT DETECTED.  The SARS-CoV-2 RNA is generally detectable in upper respiratory specimens during the acute phase of infection. The lowest concentration of SARS-CoV-2 viral copies this assay can detect is 138 copies/mL. A negative result does not preclude SARS-Cov-2 infection and should not be used as the sole basis for treatment or other patient management decisions. A negative result may occur with  improper  specimen collection/handling,  submission of specimen other than nasopharyngeal swab, presence of viral mutation(s) within the areas targeted by this assay, and inadequate number of viral copies(<138 copies/mL). A negative result must be combined with clinical observations, patient history, and epidemiological information. The expected result is Negative.  Fact Sheet for Patients:  EntrepreneurPulse.com.au  Fact Sheet for Healthcare Providers:  IncredibleEmployment.be  This test is no t yet approved or cleared by the [place] FDA and  has been authorized for detection and/or diagnosis of SARS-CoV-2 by FDA under an Emergency Use Authorization (EUA). This EUA will remain  in effect (meaning this test can be used) for the duration of the COVID-19 declaration under Section 564(b)(1) of the Act, 21 U.S.C.section 360bbb-3(b)(1), unless the authorization is terminated  or revoked sooner.       Influenza A by PCR NEGATIVE NEGATIVE Final   Influenza B by PCR NEGATIVE NEGATIVE Final    Comment: (NOTE) The Xpert Xpress SARS-CoV-2/FLU/RSV plus assay is intended as an aid in the diagnosis of influenza from Nasopharyngeal swab specimens and should not be used as a sole basis for treatment. Nasal washings and aspirates are unacceptable for Xpert Xpress SARS-CoV-2/FLU/RSV testing.  Fact Sheet for Patients: EntrepreneurPulse.com.au  Fact Sheet for Healthcare Providers: IncredibleEmployment.be  This test is not yet approved or cleared by the [place] FDA and has been authorized for detection and/or diagnosis of SARS-CoV-2 by FDA under an Emergency Use Authorization (EUA). This EUA will remain in effect (meaning this test can be used) for the duration of the COVID-19 declaration under Section 564(b)(1) of the Act, 21 U.S.C. section 360bbb-3(b)(1), unless the authorization is terminated or revoked.  Performed at [facility], [address] [place], [place] [address]   Blood Culture (routine x 2)     Status: Abnormal   Collection Time: 04/06/2020  2:06 AM   Specimen: BLOOD LEFT FOREARM  Result Value Ref Range Status   Specimen Description   Final    BLOOD LEFT FOREARM Performed at [facility], [address] [place], [place] [address]    Special Requests   Final    BOTTLES DRAWN AEROBIC AND ANAEROBIC Blood Culture adequate volume Performed at [facility], [address] [place], [place] [address]    Culture  Setup Time   Final    GRAM POSITIVE COCCI IN BOTH AEROBIC AND ANAEROBIC BOTTLES CRITICAL VALUE NOTED.  VALUE IS CONSISTENT WITH PREVIOUSLY REPORTED AND CALLED VALUE.    Culture (A)  Final    STAPHYLOCOCCUS AUREUS SUSCEPTIBILITIES PERFORMED ON PREVIOUS CULTURE WITHIN THE LAST 5 DAYS. Performed at [facility], [place] [address] [place], [place] [address]    Report Status 04/24/2020 FINAL  Final  MRSA PCR Screening     Status: None   Collection Time: 05/01/2020  6:24 AM   Specimen: Nasopharyngeal  Result Value Ref Range Status   MRSA by PCR NEGATIVE NEGATIVE Final    Comment:        The GeneXpert MRSA Assay (FDA approved for NASAL specimens only), is one component of a comprehensive MRSA colonization surveillance program. It is not intended to diagnose MRSA infection nor to guide or monitor treatment for MRSA infections. Performed at [facility], [place] [address], [place], [place] [address]   Culture, blood (single)     Status: None (Preliminary result)   Collection Time: 04/07/2020  1:53 PM   Specimen: BLOOD RIGHT HAND  Result Value  Ref Range Status   Specimen Description   Final    BLOOD RIGHT HAND Performed at [place] [address], [place], [place] [address]    Special Requests   Final    BOTTLES DRAWN AEROBIC AND ANAEROBIC Blood Culture results may not be optimal due to an inadequate volume of blood received in  culture bottles Performed at [place] [address] [place], [place] [address]    Culture  Setup Time   Final    ANAEROBIC BOTTLE ONLY GRAM POSITIVE COCCI Organism ID to follow CRITICAL RESULT CALLED TO, READ BACK BY AND VERIFIED WITH: Jonathan Shaffer [id number] MLM Performed at [facility], [place] [address] [place], [place] [address]    Culture GRAM POSITIVE COCCI  Final   Report Status PENDING  Incomplete  Blood Culture ID Panel (Reflexed)     Status: Abnormal   Collection Time: 05/01/2020  1:53 PM  Result Value Ref Range Status   Enterococcus faecalis NOT DETECTED NOT DETECTED Final   Enterococcus Faecium NOT DETECTED NOT DETECTED Final   Listeria monocytogenes NOT DETECTED NOT DETECTED Final   Staphylococcus species DETECTED (A) NOT DETECTED Final    Comment: CRITICAL RESULT CALLED TO, READ BACK BY AND VERIFIED WITH: Jonathan Shaffer [id number] MLM    Staphylococcus aureus (BCID) DETECTED (A) NOT DETECTED Final    Comment: CRITICAL RESULT CALLED TO, READ BACK BY AND VERIFIED WITH: Jonathan Shaffer [id number] MLM    Staphylococcus epidermidis NOT DETECTED NOT DETECTED Final   Staphylococcus lugdunensis NOT DETECTED NOT DETECTED Final   Streptococcus species NOT DETECTED NOT DETECTED Final   Streptococcus agalactiae NOT DETECTED NOT DETECTED Final   Streptococcus pneumoniae NOT DETECTED NOT DETECTED Final   Streptococcus pyogenes NOT DETECTED NOT DETECTED Final   A.calcoaceticus-baumannii NOT DETECTED NOT DETECTED Final   Bacteroides fragilis NOT DETECTED NOT DETECTED Final   Enterobacterales NOT DETECTED NOT DETECTED Final   Enterobacter cloacae complex NOT DETECTED NOT DETECTED Final   Escherichia coli NOT DETECTED NOT DETECTED Final   Klebsiella aerogenes NOT DETECTED NOT DETECTED Final   Klebsiella oxytoca NOT DETECTED NOT DETECTED Final   Klebsiella pneumoniae NOT DETECTED NOT DETECTED Final   Proteus species NOT DETECTED NOT DETECTED Final    Salmonella species NOT DETECTED NOT DETECTED Final   Serratia marcescens NOT DETECTED NOT DETECTED Final   Haemophilus influenzae NOT DETECTED NOT DETECTED Final   Neisseria meningitidis NOT DETECTED NOT DETECTED Final   Pseudomonas aeruginosa NOT DETECTED NOT DETECTED Final   Stenotrophomonas maltophilia NOT DETECTED NOT DETECTED Final   Candida albicans NOT DETECTED NOT DETECTED Final   Candida auris NOT DETECTED NOT DETECTED Final   Candida glabrata NOT DETECTED NOT DETECTED Final   Candida krusei NOT DETECTED NOT DETECTED Final   Candida parapsilosis NOT DETECTED NOT DETECTED Final   Candida tropicalis NOT DETECTED NOT DETECTED Final   Cryptococcus neoformans/gattii NOT DETECTED NOT DETECTED Final   Shaffer resistant mecA/C and MREJ NOT DETECTED NOT DETECTED Final    Comment: Performed at [facility], [address] [place], [place] [address]  Culture, blood (Routine X 2) w Reflex to ID Panel     Status: None (Preliminary result)   Collection Time: 04/24/20  7:52 AM   Specimen: BLOOD LEFT HAND  Result Value Ref Range Status   Specimen Description   Final    BLOOD LEFT HAND Performed at [facility], [place]  [address] [place], [place] [address]    Special Requests   Final    BOTTLES DRAWN AEROBIC AND ANAEROBIC BLOOD LEFT HAND Performed at [place] [address], [place], [place] [address]    Culture   Final    NO GROWTH 2 DAYS Performed at [facility] [place] [address], [place], [place] [address]    Report Status PENDING  Incomplete  Culture, blood (Routine X 2) w Reflex to ID Panel     Status: None (Preliminary result)   Collection Time: 04/24/20  7:52 AM   Specimen: BLOOD LEFT HAND  Result Value Ref Range Status   Specimen Description   Final    BLOOD LEFT HAND Performed at [facility], [place] [address], [place], [place] [address]    Special Requests   Final    BOTTLES DRAWN AEROBIC AND ANAEROBIC BLOOD LEFT HAND Performed at  [place] [address], [place], [place] [address]    Culture   Final    NO GROWTH 2 DAYS Performed at [place] [address] [place], [place] [address]    Report Status PENDING  Incomplete  Aerobic/Anaerobic Culture (surgical/deep wound)     Status: None (Preliminary result)   Collection Time: 04/19/2020  2:48 PM   Specimen: PATH Other; Tissue  Result Value Ref Range Status   Specimen Description   Final    ABSCESS SHOULDER RIGHT Performed at [facility] [address] [place], [place] [address]    Special Requests   Final    NONE Performed at [facility], [place] [address], [place], [place] [address]    Gram Stain   Final    FEW WBC PRESENT, PREDOMINANTLY PMN ABUNDANT GRAM POSITIVE COCCI Performed at [facility], [place] [address] [place], [place] [address]    Culture PENDING  Incomplete   Report Status PENDING  Incomplete  Aerobic/Anaerobic Culture (surgical/deep wound)     Status: None (Preliminary result)   Collection Time: 04/18/2020  2:58 PM   Specimen: PATH Other; Tissue  Result Value Ref Range Status   Specimen Description   Final    TISSUE SHOULDER RIGHT Performed at [place] [address], [place], [place] [address]    Special Requests   Final    NONE Performed at [facility], [place] [address] [place], [place] [address]    Gram Stain   Final    ABUNDANT WBC PRESENT, PREDOMINANTLY PMN ABUNDANT GRAM POSITIVE COCCI Performed at [facility], [place] [address], [place], [place] [address]    Culture PENDING  Incomplete   Report Status PENDING  Incomplete    Studies/Results: DG Wrist Complete Left  Result Date: 04/24/2020 CLINICAL DATA:  Left wrist pain. EXAM: LEFT WRIST - COMPLETE 3+ VIEW COMPARISON:  None. FINDINGS: There is no evidence of fracture or dislocation. There is no evidence of arthropathy or other focal bone abnormality. Soft  tissues are unremarkable. IMPRESSION: Negative. Electronically Signed   By: Jonathan Shaffer M.D   On: 04/24/2020 15:42   MR WRIST LEFT WO CONTRAST  Result Date: 04/24/2020 CLINICAL DATA:  Question of infection, MSSA bacteremia EXAM: MR OF THE LEFT WRIST WITHOUT CONTRAST TECHNIQUE: Multiplanar, multisequence MR imaging of the left wrist was performed. No intravenous contrast was administered. COMPARISON:  None. FINDINGS: The study is limited due to patient motion. Bones/Joint/Cartilage No definite areas of cortical destruction or  periosteal reaction. Normal osseous marrow signal is seen throughout. There is an ulnar minus variance. No large joint effusions are seen. Ligaments Somewhat limited, however the TFCC appears to be intact. The scapholunate and lunotriquetral ligaments are grossly intact. Muscles and Tendons The muscles surrounding the wrist are normal appearance without focal atrophy or tear. The flexor and extensor tendons are intact. Soft tissue Dorsal subcutaneous edema is seen surrounding the wrist. No focal fluid collections or subcutaneous emphysema. IMPRESSION: Study is somewhat limited due to patient motion. Diffuse dorsal subcutaneous edema and findings of cellulitis. No definite soft tissue abscess or evidence of osteomyelitis. Electronically Signed   By: Jonathan Shaffer M.D.   On: 04/24/2020 20:36      Assessment/Plan:  INTERVAL HISTORY:   Sp  1.  Irrigation and debridement of right acromioclavicular joint including distal clavicle, acromion 2.  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  Irrigation and debridement of right acromioclavicular joint including distal clavicle, acromion 2.  Incision and drainage deep abscess of right shoulder region  MRI left wrist without septic joint moniitor site  His PICC has been removed  We will check surveillance cultures POST removal of PICC line and pursue line holiday  He DOES need TEE given left sided endocarditis and Cardiology and CV TS consult  Duration of treatment will be 6 weeks minimum from today   LOS: 4 days   Alcide Evener 04/26/2020, 9:54 AM

## 2020-04-26 NOTE — Progress Notes (Signed)
Subjective: 1 Day Post-Op Procedure(s) (LRB): MINOR IRRIGATION AND DEBRIDEMENT OF WOUND (Right) Patient reports pain as moderate.  Shoulder feeling better this am  Objective: Vital signs in last 24 hours: Temp:  [97.7 F (36.5 C)-99.9 F (37.7 C)] 98.2 F (36.8 C) (11/23 0400) Pulse Rate:  [102-113] 103 (11/23 0600) Resp:  [14-36] 33 (11/23 0600) BP: (115-151)/(69-95) 124/76 (11/23 0600) SpO2:  [91 %-100 %] 99 % (11/23 0600) Weight:  [120.2 kg] 120.2 kg (11/22 1239)  Intake/Output from previous day: 11/22 0701 - 11/23 0700 In: 2082.8 [P.O.:400; I.V.:950; IV Piggyback:732.8] Out: 3225 [Urine:3175; Blood:50] Intake/Output this shift: No intake/output data recorded.  Recent Labs    04/24/20 0600 04/26/20 0635  HGB 10.8* 10.5*   Recent Labs    04/24/20 0600 04/26/20 0635  WBC 14.8* 15.0*  RBC 3.59* 3.50*  HCT 32.1* 32.1*  PLT 181 281   Recent Labs    04/24/20 1743 04/04/2020 0500  NA 129* 131*  K 3.7 3.1*  CL 98 100  CO2 20* 22  BUN 27* 26*  CREATININE 1.40* 1.41*  GLUCOSE 320* 237*  CALCIUM 7.2* 7.1*   No results for input(s): LABPT, INR in the last 72 hours.  Neurologically intact Neurovascular intact Sensation intact distally Intact pulses distally Dorsiflexion/Plantar flexion intact Incision: dressing C/D/I No cellulitis present Compartment soft   Assessment/Plan: 1 Day Post-Op Procedure(s) (LRB): MINOR IRRIGATION AND DEBRIDEMENT OF WOUND (Right) WBAT RUE Intra-op cx growing gram positive cocci.  Continue abx per ID Will keep penrose drain for the next few days Dry dressing change prn      Jonathan Shaffer 04/26/2020, 7:48 AM

## 2020-04-26 NOTE — Progress Notes (Signed)
Inpatient Diabetes Program Recommendations  AACE/ADA: New Consensus Statement on Inpatient Glycemic Control (2015)  Target Ranges:  Prepandial:   less than 140 mg/dL      Peak postprandial:   less than 180 mg/dL (1-2 hours)      Critically ill patients:  140 - 180 mg/dL   Lab Results  Component Value Date   GLUCAP 142 (H) 04/26/2020   HGBA1C 11.3 (H) 05/03/2020    Review of Glycemic Control Results for Jonathan Shaffer, Jonathan Shaffer (MRN [id number]) as of 04/26/2020 11:48  Ref. Range 04/27/2020 07:36 04/11/2020 12:08 04/22/2020 15:31 04/27/2020 17:04 04/26/2020 07:56  Glucose-Capillary Latest Ref Range: 70 - 99 mg/dL 223 (H) 174 (H) 151 (H) 139 (H) 142 (H)   Diabetes history:  New onset T2DM Outpatient Diabetes medications:  None Current orders for Inpatient glycemic control:  Lantus 26 units qhs Novolog 26- units tid  Novolog 4 units tid  Inpatient Diabetes Program Recommendations:    At discharge might consider Metformin 500 bid & close follow up with PCP.  Will need glucometer & supplies.  Order # [id number]  Spoke with pt and wife at bedside about new diagnosis. Discussed A1C results with them and explained what an A1C is, basic pathophysiology of DM Type 2, basic home care, basic diabetes diet nutrition principles, importance of checking CBGs and maintaining good CBG control to prevent long-term and short-term complications. Reviewed signs and symptoms of hyperglycemia and hypoglycemia and how to treat hypoglycemia at home. Also reviewed blood sugar goals at home.  RNs to provide ongoing basic DM education at bedside with this patient. Have ordered educational booklet, and DM education. Have also placed RD consult for DM diet education for this patient.  Please use each patient interaction to provide diabetes education. Please review Living Well with Diabetes booklet with the patient, have patient watch patient education videos on diabetes, and instruct on insulin administration. Please allow  patient to be actively engaged with diabetes management by allowing patient to check own glucose and self-administer insulin injections. Diabetes Coordinator will follow up with patient and reinforce diabetes education.    He was told several years ago he was "headed for diabetes" by his PCP.  Discussed CHO's and importance of moderation.  Educated them on The Plate Method.  Encouraged him to eliminate drinks with sugar.  He states he can definitely make some diet adjustments.  Encourged 15 to 20 min walks 4-5 x a week as this will help lower his A1C.  Living Well with Diabetes booklet is at bedside.  Encouraged them to read over and take home.    Asked him to check his blood sugar before breakfast and mid afternoon.  He should take his meter to PCP appointment for review.  Will continue to follow while inpatient.  Thank you, Reche Dixon, RN, BSN Diabetes Coordinator Inpatient Diabetes Program [phone] (team pager from 8a-5p)

## 2020-04-27 ENCOUNTER — Inpatient Hospital Stay (HOSPITAL_COMMUNITY): Payer: BC Managed Care – PPO

## 2020-04-27 ENCOUNTER — Encounter (HOSPITAL_COMMUNITY): Payer: Self-pay | Admitting: Family Medicine

## 2020-04-27 ENCOUNTER — Inpatient Hospital Stay (HOSPITAL_COMMUNITY): Payer: BC Managed Care – PPO | Admitting: Anesthesiology

## 2020-04-27 ENCOUNTER — Other Ambulatory Visit: Payer: Self-pay

## 2020-04-27 ENCOUNTER — Encounter (HOSPITAL_COMMUNITY): Admission: EM | Disposition: E | Payer: Self-pay | Source: Home / Self Care | Attending: Cardiothoracic Surgery

## 2020-04-27 DIAGNOSIS — L02413 Cutaneous abscess of right upper limb: Secondary | ICD-10-CM | POA: Diagnosis not present

## 2020-04-27 DIAGNOSIS — R7881 Bacteremia: Secondary | ICD-10-CM

## 2020-04-27 DIAGNOSIS — I340 Nonrheumatic mitral (valve) insufficiency: Secondary | ICD-10-CM

## 2020-04-27 DIAGNOSIS — B9561 Methicillin susceptible Staphylococcus aureus infection as the cause of diseases classified elsewhere: Secondary | ICD-10-CM

## 2020-04-27 DIAGNOSIS — I059 Rheumatic mitral valve disease, unspecified: Secondary | ICD-10-CM

## 2020-04-27 DIAGNOSIS — L988 Other specified disorders of the skin and subcutaneous tissue: Secondary | ICD-10-CM

## 2020-04-27 HISTORY — PX: TEE WITHOUT CARDIOVERSION: SHX5443

## 2020-04-27 LAB — COMPREHENSIVE METABOLIC PANEL
ALT: 21 U/L (ref 0–44)
AST: 20 U/L (ref 15–41)
Albumin: 1.6 g/dL — ABNORMAL LOW (ref 3.5–5.0)
Alkaline Phosphatase: 73 U/L (ref 38–126)
Anion gap: 7 (ref 5–15)
BUN: 17 mg/dL (ref 6–20)
CO2: 22 mmol/L (ref 22–32)
Calcium: 7 mg/dL — ABNORMAL LOW (ref 8.9–10.3)
Chloride: 102 mmol/L (ref 98–111)
Creatinine, Ser: 1.13 mg/dL (ref 0.61–1.24)
GFR, Estimated: >60 mL/min (ref >60)
Glucose, Bld: 137 mg/dL — ABNORMAL HIGH (ref 70–99)
Potassium: 3.4 mmol/L — ABNORMAL LOW (ref 3.5–5.1)
Sodium: 131 mmol/L — ABNORMAL LOW (ref 135–145)
Total Bilirubin: 2.1 mg/dL — ABNORMAL HIGH (ref 0.3–1.2)
Total Protein: 5.3 g/dL — ABNORMAL LOW (ref 6.5–8.1)

## 2020-04-27 LAB — GLUCOSE, CAPILLARY
Glucose-Capillary: 142 mg/dL — ABNORMAL HIGH (ref 70–99)
Glucose-Capillary: 113 mg/dL — ABNORMAL HIGH (ref 70–99)
Glucose-Capillary: 125 mg/dL — ABNORMAL HIGH (ref 70–99)
Glucose-Capillary: 162 mg/dL — ABNORMAL HIGH (ref 70–99)

## 2020-04-27 LAB — CBC
HCT: 31.4 % — ABNORMAL LOW (ref 39.0–52.0)
Hemoglobin: 10.1 g/dL — ABNORMAL LOW (ref 13.0–17.0)
MCH: 29.4 pg (ref 26.0–34.0)
MCHC: 32.2 g/dL (ref 30.0–36.0)
MCV: 91.3 fL (ref 80.0–100.0)
Platelets: 347 10*3/uL (ref 150–400)
RBC: 3.44 MIL/uL — ABNORMAL LOW (ref 4.22–5.81)
RDW: 15.2 % (ref 11.5–15.5)
WBC: 16.3 10*3/uL — ABNORMAL HIGH (ref 4.0–10.5)
nRBC: 0 % (ref 0.0–0.2)

## 2020-04-27 LAB — MAGNESIUM: Magnesium: 2.2 mg/dL (ref 1.7–2.4)

## 2020-04-27 LAB — LACTIC ACID, PLASMA: Lactic Acid, Venous: 1.5 mmol/L (ref 0.5–1.9)

## 2020-04-27 SURGERY — ECHOCARDIOGRAM, TRANSESOPHAGEAL
Anesthesia: Monitor Anesthesia Care

## 2020-04-27 MED ORDER — LACTATED RINGERS IV SOLN: INTRAVENOUS | Status: DC

## 2020-04-27 MED ORDER — POTASSIUM CHLORIDE CRYS ER 20 MEQ PO TBCR
40.0000 meq | EXTENDED_RELEASE_TABLET | Freq: Once | ORAL | Status: AC
  Administered 2020-04-27: 40 meq via ORAL
  Filled 2020-04-27: qty 2

## 2020-04-27 MED ORDER — PROPOFOL 500 MG/50ML IV EMUL
INTRAVENOUS | Status: DC | PRN
  Administered 2020-04-27: 100 ug/kg/min via INTRAVENOUS

## 2020-04-27 MED ORDER — LACTATED RINGERS IV SOLN: INTRAVENOUS | Status: DC | PRN

## 2020-04-27 MED ORDER — LIDOCAINE 2% (20 MG/ML) 5 ML SYRINGE
INTRAMUSCULAR | Status: DC | PRN
  Administered 2020-04-27: 100 mg via INTRAVENOUS

## 2020-04-27 MED ORDER — LACTATED RINGERS IV BOLUS
500.0000 mL | Freq: Once | INTRAVENOUS | Status: AC
  Administered 2020-04-27: 500 mL via INTRAVENOUS

## 2020-04-27 MED ORDER — PROPOFOL 10 MG/ML IV BOLUS
INTRAVENOUS | Status: DC | PRN
  Administered 2020-04-27: 30 mg via INTRAVENOUS
  Administered 2020-04-27: 20 mg via INTRAVENOUS
  Administered 2020-04-27: 50 mg via INTRAVENOUS

## 2020-04-27 NOTE — Discharge Instructions (Signed)
Carbohydrate Counting for People with Diabetes Foods with carbohydrates make your blood glucose level go up.  Learning how to count carbohydrates can help you control your blood glucose levels.  First, identify the foods you eat that contain carbohydrates.  Then, using the Foods with Carbohydrates chart, determine about how much carbohydrates are in your meals and snacks.  Make sure you are eating foods with fiber, protein, and healthy fat along with your carbohydrate foods.  Foods with Carbohydrates The following table shows carbohydrate foods that have about 15 grams of carbohydrate each.  Using measuring cups, spoons, or a food scale when you first begin learning about carbohydrate counting can help you learn about the portion sizes you typically eat. The following foods have 15 grams carbohydrate each:  Grains 1 slice bread (1 ounce) 1 small tortilla (6-inch size)  large bagel (1 ounce) 1/3 cup pasta or rice (cooked)  hamburger or hot dog bun ( ounce)  cup cooked cereal  to  cup ready-to-eat cereal 2 taco shells (5-inch size)  Fruit 1 small fresh fruit ( to 1 cup)  medium banana 17 small grapes (3 ounces) 1 cup melon or berries  cup canned or frozen fruit 2 tablespoons dried fruit (blueberries, cherries, cranberries, raisins)  cup unsweetened fruit juice  Starchy Vegetables  cup cooked beans, peas, corn, potatoes/sweet potatoes  large baked potato (3 ounces) 1 cup acorn or butternut squash  Snack Foods 3 to 6 crackers 8 potato chips or 13 tortilla chips ( ounce to 1 ounce) 3 cups popped popcorn  Dairy 3/4 cup (6 ounces) nonfat plain yogurt, or yogurt with sugar-free sweetener 1 cup milk 1 cup plain rice, soy, coconut or flavored almond milk  Sweets and Desserts  cup ice cream or frozen yogurt 1 tablespoon jam, jelly, pancake syrup, table sugar, or honey 2 tablespoons light pancake syrup 1 inch square of frosted cake or 2 inch square of unfrosted  cake 2 small cookies (2/3 ounce each) or  large cookie  Sometimes youll have to estimate carbohydrate amounts if you dont know the exact recipe.  One cup of mixed foods like soups can have 1 to 2 carbohydrate servings, while some casseroles might have 2 or more servings of carbohydrate.  Foods that have less than 20 calories in each serving can be counted as free foods.  Count 1 cup raw vegetables, or  cup cooked non-starchy vegetables as free foods.  If you eat 3 or more servings at one meal, then count them as 1 carbohydrate serving.  Foods without Carbohydrates Not all foods contain carbohydrates. Meat, some dairy, fats, non-starchy vegetables, and many beverages dont contain carbohydrate.  So when you count carbohydrates, you can generally exclude chicken, pork, beef, fish, seafood, eggs, tofu, cheese, butter, sour cream, avocado, nuts, seeds, olives, mayonnaise, water, black coffee, unsweetened tea, and zero-calorie drinks.  Vegetables with no or low carbohydrate include green beans, cauliflower, tomatoes, and onions.  How much carbohydrate should I eat at each meal? Carbohydrate counting can help you plan your meals and manage your weight.   Following are some starting points for carbohydrate intake at each meal.   To Lose Weight To Maintain Weight Women 2 - 3 carb servings 3 - 4 carb servings Men 3 - 4 carb servings 4 - 5 carb servings Checking your blood glucose after meals will help you know if you need to adjust the timing, type, or number of carbohydrate servings in your meal plan.  Achieve and keep a healthy  body weight by balancing your food intake and physical activity.  How should I plan my meals? Plan for half the food on your plate to include non-starchy vegetables, like salad greens, broccoli, or carrots.  Try to eat 3 to 5 servings of non-starchy vegetables every day.  Have a protein food at each meal.  Protein foods include chicken, fish, meat, eggs, or  beans (note that beans contain carbohydrate).  These two food groups (non-starchy vegetables and proteins) are low in carbohydrate.  If you fill up your plate with these foods, you will eat less carbohydrate but still fill up your stomach.  Try to limit your carbohydrate portion to  of the plate.  What fats are healthiest to eat? Diabetes increases risk for heart disease.  To help protect your heart, eat more healthy fats, such as olive oil, nuts, and avocado.  Eat less saturated fats like butter, cream, and high-fat meats, like bacon and sausage.  Avoid trans fats, which are in all foods that list partially hydrogenated oil as an ingredient.  What should I drink? Choose drinks that are not sweetened with sugar.  The healthiest choices are water, carbonated or seltzer waters, and tea and coffee without added sugars. Sweet drinks will make your blood glucose go up very quickly.  One serving of soda or energy drink is  cup. It is best to drink these beverages only if your blood glucose is low. Artificially sweetened, or diet drinks, typically do not increase your blood glucose if they have zero calories in them. Read labels of beverages, as some diet drinks do have carbohydrate and will raise your blood glucose.  Label Reading Tips Read Nutrition Facts labels to find out how many grams of carbohydrate are in a food you want to eat.  Dont forget: sometimes serving sizes on the label arent the same as how much food you are going to eat, so you may need to calculate how much carbohydrate is in the food you are serving yourself.

## 2020-04-27 NOTE — Progress Notes (Signed)
   04/16/2020 1600  Assess: MEWS Score  BP 130/81  Pulse Rate (!) 109  ECG Heart Rate (!) 110  Resp 18  Level of Consciousness Alert  SpO2 97 %  Assess: MEWS Score  MEWS Temp 0  MEWS Systolic 0  MEWS Pulse 1  MEWS RR 0  MEWS LOC 0  MEWS Score 1  MEWS Score Color Green  Assess: if the MEWS score is Yellow or Red  Were vital signs taken at a resting state? No  Focused Assessment No change from prior assessment  Early Detection of Sepsis Score *See Row Information* Low  MEWS guidelines implemented *See Row Information* Yes  Treat  Pain Scale 0-10  Pain Score 0  Pain Type Acute pain  Pain Location Generalized  Pain Orientation Right  Notify: Charge Nurse/RN  Name of Charge Nurse/RN Notified Bonney Roussel  Date Charge Nurse/RN Notified 04/10/2020  Time Charge Nurse/RN Notified 1628  Notify: Provider  Provider Name/Title Glenford Peers NP  Date Provider Notified 04/05/2020  Time Provider Notified [phone]  Notification Type Page  Notification Reason Requested by patient/family  Response No new orders  Document  Patient Outcome Not stable and remains on department  Progress note created (see row info) Yes

## 2020-04-27 NOTE — CV Procedure (Signed)
     Transesophageal Echocardiogram Note  Jonathan Shaffer [id number] 08/08/1965  Procedure: Transesophageal Echocardiogram Indications: Baceremia  Procedure Details Consent: Obtained Time Out: Verified patient identification, verified procedure, site/side was marked, verified correct patient position, special equipment/implants available, Radiology Safety Procedures followed,  medications/allergies/relevent history reviewed, required imaging and test results available.  Performed  Medications: During this procedure the patient is administered a total of Propofol 400 mg and Lidocaine 100 mg to achieve and maintaindeep sedation.  The patient's heart rate, blood pressure, and oxygen saturation are monitored continuously during the procedure. The period of conscious sedation is 40 minutes, of which I was present face-to-face 100% of this time.  Left Ventrical:  LVEF 55-60%  Mitral Valve: There is a large echodensity that is partially fixed (below the valve on the ventricular side and possibly representing an abscess) and partially hypermobile, multilobar ( on the left atrial side) consistent with a vegetation with high risk for embolization. This involves P2,3 and A3 leaflets. Vegetation size is 4.4 x 2.4 cm. There is only mild mitral regurgitation.   Aortic Valve: no vegetation, no AI  Tricuspid Valve: no vegetation, mild TR  Pulmonic Valve: no vegetation, no PR  Left Atrium/ Left atrial appendage: No thrombus  Atrial septum: no ASD or PFO by color Doppler  Aorta: mild non-mobile plaque  CT surgery and hospitalist Dr Avon Gully were notified at 1:10 pm.The patient will be transferred to the [facility].  Complications: No apparent complications Patient did tolerate procedure well.  Jonathan Dawley, MD, [facility] 04/14/2020, 1:02 PM

## 2020-04-27 NOTE — Progress Notes (Signed)
Subjective: 2 Days Post-Op Procedure(s) (LRB): MINOR IRRIGATION AND DEBRIDEMENT OF WOUND (Right) Patient reports pain as mild.    Objective: Vital signs in last 24 hours: Temp:  [98.7 F (37.1 C)-100.1 F (37.8 C)] 98.7 F (37.1 C) (11/24 0328) Pulse Rate:  [104-115] 108 (11/24 0600) Resp:  [19-37] 29 (11/24 0600) BP: (102-198)/(42-140) 163/71 (11/24 0600) SpO2:  [94 %-100 %] 95 % (11/24 0600)  Intake/Output from previous day: 11/23 0701 - 11/24 0700 In: 610 [I.V.:10; IV Piggyback:600] Out: 3350 [Urine:3350] Intake/Output this shift: No intake/output data recorded.  Recent Labs    04/26/20 0635 04/13/2020 0228  HGB 10.5* 10.1*   Recent Labs    04/26/20 0635 04/11/2020 0228  WBC 15.0* 16.3*  RBC 3.50* 3.44*  HCT 32.1* 31.4*  PLT 281 347   Recent Labs    04/26/20 0958 04/30/2020 0228  NA 131* 131*  K 3.5 3.4*  CL 101 102  CO2 20* 22  BUN 22* 17  CREATININE 1.25* 1.13  GLUCOSE 218* 137*  CALCIUM 7.0* 7.0*   No results for input(s): LABPT, INR in the last 72 hours.  Neurovascular intact Sensation intact distally Intact pulses distally Incision: dressing C/D/I   Assessment/Plan: 2 Days Post-Op Procedure(s) (LRB): MINOR IRRIGATION AND DEBRIDEMENT OF WOUND (Right) Right shoulder- WBAT.  Intra-op cx growing gram positive cocci.  Continue abx per ID Will keep penrose drain for the next few days Dry dressing change prn      Aundra Dubin 04/15/2020, 8:00 AM

## 2020-04-27 NOTE — Hospital Course (Addendum)
History of Present Illness:  Mr. Jonathan Shaffer is a 54 yo obese male with history of DM, Hypercholesterolemia, thyroid disorder and testicular cancer.  The patient was in his usual state of health until about 10 days ago at which time he developed complaints of sudden onset nausea, vomiting, and chills.  Post illness he developed complaints of right shoulder pain and immobility.  He presented to [facility] ED for evaluation.  Workup ultimately led to admission for sepsis and DKA.  He was noted to be MSSA positive in his blood.   CT scan of abdomen and pelvis showed splenic infarcts.  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  They recommended Vancomycin, Cefipime, and Doxycycline,.  They also felt patient would need an TEE to evaluate for Endocarditis.  Orthopedics consult was requested who obtained MRI of the patient's right shoulder.  This showed evidence of a deep infection that would require surgical intervention.  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  A penrose drain remained in place and was ultimately removed on 04/16/2020.  Echocardiogram was performed on 04/25/2020.  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  It was felt the patient would need surgical evaluation for his Mitral Valve.  He was transferred to [facility] on 04/09/2020.  History of Present Illness:   The patient was evaluated by Dr. Orvan Seen on 04/28/2020.  He felt the patient would require surgery during this admission.  However it was not felt to be urgent as the patient did not have heart block or severe MR.  He felt the patient should undergo a left heart  catheterization prior to proceeding with surgery.  This was performed on 04/15/2020 and showed no evidence of obstructive CAD.  It was felt the patient could have surgery on 05/03/2020.  The risks and benefits of the procedure were explained to the patient and he was agreeable to proceed.  The patient was taken to the operating room on 05/02/2020.  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  He tolerated the procedure without difficulty and was taken to the SICU in stable condition.  The patient was extubated the evening of surgery.  The patient was transfused 2 units of packed cells due to expected post operative blood loss anemia.  During his stay in the SICU the patient was weaned off Levophed and Vasopressin as hemodynamics allowed.  He required Epinephrine for a few additional days.  This was able to be discontinued on 11/**/2021.  The patient developed ecchymosis of his left foot.  Infectious disease who was following patient recommended obtained US.  This was done on 11/**/2021.  This showed ***.

## 2020-04-27 NOTE — Progress Notes (Signed)
    [facility] has been requested to perform a transesophageal echocardiogram on Jonathan Shaffer for bacteremia.  After careful review of history and examination, the risks and benefits of transesophageal echocardiogram have been explained including risks of esophageal damage, perforation (1:10,000 risk), bleeding, pharyngeal hematoma as well as other potential complications associated with conscious sedation including aspiration, arrhythmia, respiratory failure and death. Alternatives to treatment were discussed, questions were answered. Patient is willing to proceed.   Yoshiharu Brassell Ninfa Meeker, PA-C  04/21/2020 8:20 AM

## 2020-04-27 NOTE — Transfer of Care (Signed)
Immediate Anesthesia Transfer of Care Note  Patient: Jonathan Shaffer  Procedure(s) Performed: TRANSESOPHAGEAL ECHOCARDIOGRAM (TEE) (N/A )  Patient Location: PACU  Anesthesia Type:General  Level of Consciousness: drowsy and patient cooperative  Airway & Oxygen Therapy: Patient Spontanous Breathing and Patient connected to nasal cannula oxygen  Post-op Assessment: Report given to RN and Post -op Vital signs reviewed and stable  Post vital signs: Reviewed and stable  Last Vitals:  Vitals Value Taken Time  BP 146/114   Temp    Pulse 102 04/26/2020 1304  Resp 35 04/17/2020 1304  SpO2 98 % 04/11/2020 1304  Vitals shown include unvalidated device data.  Last Pain:  Vitals:   04/21/2020 1301  TempSrc: Oral  PainSc: 0-No pain      Patients Stated Pain Goal: 2 (83/41/96 2229)  Complications: No complications documented.

## 2020-04-27 NOTE — Progress Notes (Signed)
NUTRITION NOTE RD working remotely.   Consult received for new dx of Type 2 DM; diet education. Per review of PMH, patient had dx of DM prior to this hospitalization, but unsure of when he was diagnosed.   He is currently NPO and is at [facility] for Endoscopy. He was previously on CLD and was on Carb Modified diet 11/21-11/23 with 50% intakes on average.   CBGs today have been 142 and 113 mg/dl. Medications include sliding scale novolog and 30 units lantus/day.  Will place handouts from the [employer] in the Discharge Instructions.   No additional nutrition-related needs at this time. If additional needs arise, please re-consult.      Jonathan Matin, MS, RD, LDN, CNSC Inpatient Clinical Dietitian RD pager # available in [place]  After hours/weekend pager # available in [facility]

## 2020-04-27 NOTE — Anesthesia Postprocedure Evaluation (Signed)
Anesthesia Post Note  Patient: Jonathan Shaffer  Procedure(s) Performed: TRANSESOPHAGEAL ECHOCARDIOGRAM (TEE) (N/A )     Patient location during evaluation: Endoscopy Anesthesia Type: MAC Level of consciousness: awake and alert Pain management: pain level controlled Vital Signs Assessment: post-procedure vital signs reviewed and stable Respiratory status: spontaneous breathing, nonlabored ventilation, respiratory function stable and patient connected to nasal cannula oxygen Cardiovascular status: blood pressure returned to baseline and stable Postop Assessment: no apparent nausea or vomiting Anesthetic complications: no   No complications documented.  Last Vitals:  Vitals:   04/04/2020 1400 04/16/2020 1427  BP: 127/67   Pulse: (!) 104   Resp: (!) 29   Temp:  37.2 C  SpO2: 97%     Last Pain:  Vitals:   04/10/2020 1427  TempSrc: Oral  PainSc:                  Jonathan Shaffer

## 2020-04-27 NOTE — Progress Notes (Signed)
eLink Physician-Brief Progress Note Patient Name: Jonathan Shaffer DOB: [birth year] MRN: [id number]   Date of Service  04/25/2020  HPI/Events of Note  K 3.4 Creatinine 1.13  eICU Interventions  Ordered k 40 meqs PO Add on Mg level     Intervention Category Minor Interventions: Electrolytes abnormality - evaluation and management  [name] 04/10/2020, 3:56 AM

## 2020-04-27 NOTE — Progress Notes (Signed)
PROGRESS NOTE  CULVER FEIGHNER  IRJ:[id number] DOB: 14-Apr-1966 DOA: 04/04/2020 PCP: Patient, No Pcp Per  Brief Narrative:  Mr. Gilardi is a 54 y/o gentleman admitted with sepsis, found to have MSSA bacteremia. He developed sudden onset nausea and vomiting and chills last week and later developed fevers and R shoulder pain. He was evaluated and felt to have a sprain. He was treated with an OP steroid injection and started oral steroids. He developed a painful rash on his hand. His progressive malaise, fevers, rash, and joint pain prompted return to the ED. At presentation he was febrile, tachycardic, tachypneic, with lactic acidosis. He was in DKA at presentation. He has a previous history of "high diabetes markers", which he reports he was not following up on. He was started on empiric vanc, zosyn & cefepime, and doxycycline. He has been evaluated by orthopaedics for possible septic arthritis of his R AC joint. ID has been consulted for MSSA+ blood cultures. No known history of IVDU or injuries prior to becoming ill. He has been encephalopathic this admission, prompting concern for septic emboli to the brain; MRI shows multiple small acute infarcts in multiple lobes/cerebellum.  Assessment & Plan:   Principal Problem:   Abscess of right shoulder Active Problems:   Severe sepsis (HCC)   Splenic infarct   Diabetic acidosis without coma (HCC)   AKI (acute kidney injury) ([place])  Septic shock due to MSSA bacteremia; complicated by [facility] septic arthritis and clavicular osteomyelitis as an uncontrolled source.  - High risk for endocarditis with splenic hemorrhages, embolic strokes, and janeway lesions. Incomplete source control with AC septic arthritis. - Preliminary negative: TEE pending cardiology following, appreciate insight and recommendations; **Afternoon update - TEE remarkable for 5 centimeter vegetation - CTSx to follow along - Dr. Cyndia Bent updated this afternoon - Appreciate ID's assistance; con't  nafcillin given sensitivities - Repeated blood cultures this morning. Continue to follow previous cultures.  - Needs 2 negative sets of blood cultures - prelim results on 23rd pending  - PICC line removed- on line holiday currently. - Ortho following irrigation and debridement of the right shoulder  Acute encephalopathy, multifactorial etiology Acute infarcts, hospital delirium, sepsis, sundowning - Out of bed mobility as able; PT consult - Prioritize appropriate sleep-wake cycles  AKI - improving - Renally dose medications, avoid nephrotoxic meds - Maintain MAP > 65 for adequate renal perfusion - continues off pressors - Strict I/Os  Hypokalemia, resolved - Continue to monitor  Hyponatremia-stable - Continue to monitor  Uncontrolled DM with hyperglycemia; prev in DKA.  - A1c 11.3 - Basal bolus insulin; increase glargine to 30 units, increase mealtime coverage to 7 units TIDAC - SSI QID PRN  - Goal BG 140-180 while critically ill; will hold off on insulin drip for now as his glucose control is improving with subcutaneous insulin. - Needs OP DM education and much more strict control  Acute anemia; suspect this is mixed due to critical illness & dilution- stable - Con't to monitor - Transfuse for Hb <7 or hemodynamically significant bleeding  Hyperbilirubinemia likely 2/2 sepsis - Worsened without increase in LFTs. Hb stable over past few days. - Continue to monitor  Wrist pain due to cellulitis - Continue antibiotics as above  DVT prophylaxis: Lovenox Code Status: Full Family Communication: Wife at bedside  Status is: Inpatient  Dispo: The patient is from: Home             Anticipated d/c is to: To be determined  Anticipated d/c date is: Greater than 3 days             Patient currently not medically stable for discharge given ongoing antibiotics, further imaging and evaluation ossification.  Consultants:  ID Ortho PCCM  Procedures/Testing: OR for  washout 11/22 of AC & overlying muscle abscess. MRI R shoulder> septic arthritis AC joint, possible intramuscular abscess, OM distal clavicle Echocardiogram 11/19> LVEF 55 to 60%, normal valves. LUE US> no DVT MRI left wrist> cellulitis, no septic arthritis PICC placed at admission - now removed - line holiday as above MRI brain - multiple small acute infarcts per radiology TEE planned 11/24  Antimicrobials:  Vanc 11/20 x1 Zosyn 11/19 x1 Cefepime 11/19- 11/20 Doxycycline 11/19 x1 Nafcillin 11/20 > ongoing  Subjective: No acute issues or events overnight, patient still somewhat confused, and hearing assistance this morning but - AOx4; complains of generalized weakness and malaise but no focal pain or deficits.  Objective: Vitals:   04/22/2020 0500 04/30/2020 0523 04/20/2020 0526 04/19/2020 0600  BP: (!) 198/80 (!) 174/92 (!) 147/92 (!) 163/71  Pulse: (!) 109 (!) 106 (!) 108 (!) 108  Resp: (!) 26 (!) 31 (!) 29 (!) 29  Temp:      TempSrc:      SpO2: 98% 98% 98% 95%  Weight:      Height:        Intake/Output Summary (Last 24 hours) at 05/03/2020 0707 Last data filed at 04/09/2020 0616 Gross per 24 hour  Intake 610 ml  Output 3350 ml  Net -2740 ml   Filed Weights   04/21/2020 0124 04/29/2020 0635 05/01/2020 1239  Weight: 117 kg 126.8 kg 120.2 kg    Examination:  General:  Pleasantly resting in bed, No acute distress. HEENT:  Normocephalic atraumatic.  Sclerae nonicteric, noninjected.  Extraocular movements intact bilaterally. Neck:  Without mass or deformity.  Trachea is midline. Lungs:  Clear to auscultate bilaterally without rhonchi, wheeze, or rales. Heart:  Regular rate and rhythm.  Without murmurs, rubs, or gallops. Abdomen:  Soft, nontender, nondistended.  Without guarding or rebound. Extremities: Without cyanosis, clubbing, edema, or obvious deformity. Vascular:  Dorsalis pedis and posterior tibial pulses palpable bilaterally. Skin:  Warm and dry, blanching erythema and  janeway lesions noted diffusely on anterior/posterior trunk and more sparingly on limbs.  Data Reviewed: I have personally reviewed following labs and imaging studies  CBC: Recent Labs  Lab 05/03/2020 0202 04/16/2020 0202 04/06/2020 0204 04/23/20 0537 04/24/20 0600 04/26/20 0635 04/23/2020 0228  WBC 21.3*  --   --  16.3* 14.8* 15.0* 16.3*  NEUTROABS 19.5*  --   --   --   --   --   --   HGB 14.0   < > 15.6 11.0* 10.8* 10.5* 10.1*  HCT 41.6   < > 46.0 32.8* 32.1* 32.1* 31.4*  MCV 89.1  --   --  89.4 89.4 91.7 91.3  PLT 191  --   --  151 181 281 347   < > = values in this interval not displayed.   Basic Metabolic Panel: Recent Labs  Lab 04/23/20 0537 04/23/20 1800 04/24/20 0600 04/24/20 1743 04/24/2020 0500 04/26/20 0958 04/27/20 0228  NA 134*   < > 130* 129* 131* 131* 131*  K 2.8*   < > 3.4* 3.7 3.1* 3.5 3.4*  CL 103   < > 100 98 100 101 102  CO2 22   < > 19* 20* 22 20* 22  GLUCOSE 173*   < >  333* 320* 237* 218* 137*  BUN 25*   < > 25* 27* 26* 22* 17  CREATININE 1.34*   < > 1.44* 1.40* 1.41* 1.25* 1.13  CALCIUM 7.4*   < > 7.1* 7.2* 7.1* 7.0* 7.0*  MG 2.0  --   --   --   --   --  2.2   < > = values in this interval not displayed.   GFR: Estimated Creatinine Clearance: 104.4 mL/min (by C-G formula based on SCr of 1.13 mg/dL). Liver Function Tests: Recent Labs  Lab 05/03/2020 0202 04/23/20 0537 04/30/2020 0500 04/16/2020 0228  AST 40 41 24 20  ALT 48* 32 36 21  ALKPHOS 133* 75 91 73  BILITOT 2.0* 1.5* 2.3* 2.1*  PROT 6.4* 4.9* 5.2* 5.3*  ALBUMIN 2.4* 1.7* 1.6* 1.6*   Recent Labs  Lab 04/07/2020 0202  LIPASE 35   Recent Labs  Lab 04/08/2020 0202  AMMONIA 23   Coagulation Profile: Recent Labs  Lab 04/17/2020 0202  INR 1.3*   Cardiac Enzymes: Recent Labs  Lab 04/11/2020 0202  CKTOTAL 70   BNP (last 3 results) No results for input(s): PROBNP in the last 8760 hours. HbA1C: No results for input(s): HGBA1C in the last 72 hours. CBG: Recent Labs  Lab 04/20/2020 1704  04/26/20 0756 04/26/20 1153 04/26/20 1648 04/26/20 2149  GLUCAP 139* 142* 194* 151* 110*   Lipid Profile: No results for input(s): CHOL, HDL, LDLCALC, TRIG, CHOLHDL, LDLDIRECT in the last 72 hours. Thyroid Function Tests: No results for input(s): TSH, T4TOTAL, FREET4, T3FREE, THYROIDAB in the last 72 hours. Anemia Panel: No results for input(s): VITAMINB12, FOLATE, FERRITIN, TIBC, IRON, RETICCTPCT in the last 72 hours. Sepsis Labs: Recent Labs  Lab 04/09/2020 2200 04/23/20 1345 04/23/20 1701 04/23/20 2015  LATICACIDVEN 1.4 9.0* 1.3 1.4    Recent Results (from the past 240 hour(s))  Blood Culture (routine x 2)     Status: Abnormal   Collection Time: 04/14/2020  2:00 AM   Specimen: BLOOD RIGHT HAND  Result Value Ref Range Status   Specimen Description   Final    BLOOD RIGHT HAND Performed at [facility], [address] [place], [place] [address]    Special Requests   Final    BOTTLES DRAWN AEROBIC AND ANAEROBIC Blood Culture adequate volume Performed at [facility], [address] [place], [place] [address]    Culture  Setup Time   Final    GRAM POSITIVE COCCI IN BOTH AEROBIC AND ANAEROBIC BOTTLES CRITICAL RESULT CALLED TO, READ BACK BY AND VERIFIED WITHSeward Meth [facility] 2109 04/21/2020 A BROWNING Performed at [facility], [place] [address] [place], [place] [address]    Culture STAPHYLOCOCCUS AUREUS (A)  Final   Report Status 04/24/2020 FINAL  Final   Organism ID, Bacteria STAPHYLOCOCCUS AUREUS  Final      Susceptibility   Staphylococcus aureus - MIC*    CIPROFLOXACIN <=0.5 SENSITIVE Sensitive     ERYTHROMYCIN <=0.25 SENSITIVE Sensitive     GENTAMICIN <=0.5 SENSITIVE Sensitive     OXACILLIN 0.5 SENSITIVE Sensitive     TETRACYCLINE <=1 SENSITIVE Sensitive     VANCOMYCIN 1 SENSITIVE Sensitive     TRIMETH/SULFA <=10 SENSITIVE Sensitive     CLINDAMYCIN <=0.25 SENSITIVE Sensitive     RIFAMPIN <=0.5 SENSITIVE Sensitive     Inducible  Clindamycin NEGATIVE Sensitive     * STAPHYLOCOCCUS AUREUS  Blood Culture ID Panel (Reflexed)  Status: Abnormal   Collection Time: 04/14/2020  2:00 AM  Result Value Ref Range Status   Enterococcus faecalis NOT DETECTED NOT DETECTED Final   Enterococcus Faecium NOT DETECTED NOT DETECTED Final   Listeria monocytogenes NOT DETECTED NOT DETECTED Final   Staphylococcus species DETECTED (A) NOT DETECTED Final    Comment: CRITICAL RESULT CALLED TO, READ BACK BY AND VERIFIED WITH: N GOLGOVAC PHARMD 2109 04/12/2020 A BROWNING    Staphylococcus aureus (BCID) DETECTED (A) NOT DETECTED Final    Comment: CRITICAL RESULT CALLED TO, READ BACK BY AND VERIFIED WITH: N GOLGOVAC PHARMD 2109 04/21/2020 A BROWNING    Staphylococcus epidermidis NOT DETECTED NOT DETECTED Final   Staphylococcus lugdunensis NOT DETECTED NOT DETECTED Final   Streptococcus species NOT DETECTED NOT DETECTED Final   Streptococcus agalactiae NOT DETECTED NOT DETECTED Final   Streptococcus pneumoniae NOT DETECTED NOT DETECTED Final   Streptococcus pyogenes NOT DETECTED NOT DETECTED Final   A.calcoaceticus-baumannii NOT DETECTED NOT DETECTED Final   Bacteroides fragilis NOT DETECTED NOT DETECTED Final   Enterobacterales NOT DETECTED NOT DETECTED Final   Enterobacter cloacae complex NOT DETECTED NOT DETECTED Final   Escherichia coli NOT DETECTED NOT DETECTED Final   Klebsiella aerogenes NOT DETECTED NOT DETECTED Final   Klebsiella oxytoca NOT DETECTED NOT DETECTED Final   Klebsiella pneumoniae NOT DETECTED NOT DETECTED Final   Proteus species NOT DETECTED NOT DETECTED Final   Salmonella species NOT DETECTED NOT DETECTED Final   Serratia marcescens NOT DETECTED NOT DETECTED Final   Haemophilus influenzae NOT DETECTED NOT DETECTED Final   Neisseria meningitidis NOT DETECTED NOT DETECTED Final   Pseudomonas aeruginosa NOT DETECTED NOT DETECTED Final   Stenotrophomonas maltophilia NOT DETECTED NOT DETECTED Final   Candida albicans NOT  DETECTED NOT DETECTED Final   Candida auris NOT DETECTED NOT DETECTED Final   Candida glabrata NOT DETECTED NOT DETECTED Final   Candida krusei NOT DETECTED NOT DETECTED Final   Candida parapsilosis NOT DETECTED NOT DETECTED Final   Candida tropicalis NOT DETECTED NOT DETECTED Final   Cryptococcus neoformans/gattii NOT DETECTED NOT DETECTED Final   Meth resistant mecA/C and MREJ NOT DETECTED NOT DETECTED Final    Comment: Performed at [facility], [address], [place], [place] [address]  Urine culture     Status: Abnormal   Collection Time: 04/16/2020  2:02 AM   Specimen: In/Out Cath Urine  Result Value Ref Range Status   Specimen Description IN/OUT CATH URINE  Final   Special Requests NONE  Final   Culture 40,000 COLONIES/mL STAPHYLOCOCCUS AUREUS (A)  Final   Report Status 04/24/2020 FINAL  Final   Organism ID, Bacteria STAPHYLOCOCCUS AUREUS (A)  Final      Susceptibility   Staphylococcus aureus - MIC*    CIPROFLOXACIN <=0.5 SENSITIVE Sensitive     GENTAMICIN <=0.5 SENSITIVE Sensitive     NITROFURANTOIN 32 SENSITIVE Sensitive     OXACILLIN <=0.25 SENSITIVE Sensitive     TETRACYCLINE <=1 SENSITIVE Sensitive     VANCOMYCIN 1 SENSITIVE Sensitive     TRIMETH/SULFA <=10 SENSITIVE Sensitive     CLINDAMYCIN <=0.25 SENSITIVE Sensitive     RIFAMPIN <=0.5 SENSITIVE Sensitive     Inducible Clindamycin NEGATIVE Sensitive     * 40,000 COLONIES/mL STAPHYLOCOCCUS AUREUS  Resp Panel by RT-PCR (Flu A&B, Covid) Nasopharyngeal Swab     Status: None   Collection Time: 04/28/2020  2:05 AM   Specimen: Nasopharyngeal Swab; Nasopharyngeal(NP) swabs in vial transport medium  Result Value Ref Range  Status   SARS Coronavirus 2 by RT PCR NEGATIVE NEGATIVE Final    Comment: (NOTE) SARS-CoV-2 target nucleic acids are NOT DETECTED.  The SARS-CoV-2 RNA is generally detectable in upper respiratory specimens during the acute phase of infection. The lowest concentration of SARS-CoV-2 viral copies this  assay can detect is 138 copies/mL. A negative result does not preclude SARS-Cov-2 infection and should not be used as the sole basis for treatment or other patient management decisions. A negative result may occur with  improper specimen collection/handling, submission of specimen other than nasopharyngeal swab, presence of viral mutation(s) within the areas targeted by this assay, and inadequate number of viral copies(<138 copies/mL). A negative result must be combined with clinical observations, patient history, and epidemiological information. The expected result is Negative.  Fact Sheet for Patients:  EntrepreneurPulse.com.au  Fact Sheet for Healthcare Providers:  IncredibleEmployment.be  This test is no t yet approved or cleared by the [place] FDA and  has been authorized for detection and/or diagnosis of SARS-CoV-2 by FDA under an Emergency Use Authorization (EUA). This EUA will remain  in effect (meaning this test can be used) for the duration of the COVID-19 declaration under Section 564(b)(1) of the Act, 21 U.S.C.section 360bbb-3(b)(1), unless the authorization is terminated  or revoked sooner.       Influenza A by PCR NEGATIVE NEGATIVE Final   Influenza B by PCR NEGATIVE NEGATIVE Final    Comment: (NOTE) The Xpert Xpress SARS-CoV-2/FLU/RSV plus assay is intended as an aid in the diagnosis of influenza from Nasopharyngeal swab specimens and should not be used as a sole basis for treatment. Nasal washings and aspirates are unacceptable for Xpert Xpress SARS-CoV-2/FLU/RSV testing.  Fact Sheet for Patients: EntrepreneurPulse.com.au  Fact Sheet for Healthcare Providers: IncredibleEmployment.be  This test is not yet approved or cleared by the [place] FDA and has been authorized for detection and/or diagnosis of SARS-CoV-2 by FDA under an Emergency Use Authorization (EUA). This EUA will  remain in effect (meaning this test can be used) for the duration of the COVID-19 declaration under Section 564(b)(1) of the Act, 21 U.S.C. section 360bbb-3(b)(1), unless the authorization is terminated or revoked.  Performed at [facility] [address] [place], [place] [address]   Blood Culture (routine x 2)     Status: Abnormal   Collection Time: 04/23/2020  2:06 AM   Specimen: BLOOD LEFT FOREARM  Result Value Ref Range Status   Specimen Description   Final    BLOOD LEFT FOREARM Performed at [facility], [address] [place], [place] [address]    Special Requests   Final    BOTTLES DRAWN AEROBIC AND ANAEROBIC Blood Culture adequate volume Performed at [facility], [address] [place], [place] [address]    Culture  Setup Time   Final    GRAM POSITIVE COCCI IN BOTH AEROBIC AND ANAEROBIC BOTTLES CRITICAL VALUE NOTED.  VALUE IS CONSISTENT WITH PREVIOUSLY REPORTED AND CALLED VALUE.    Culture (A)  Final    STAPHYLOCOCCUS AUREUS SUSCEPTIBILITIES PERFORMED ON PREVIOUS CULTURE WITHIN THE LAST 5 DAYS. Performed at [facility], [place] [address], [place], [place] [address]    Report Status 04/24/2020 FINAL  Final  MRSA PCR Screening     Status: None   Collection Time: 04/26/2020  6:24 AM   Specimen: Nasopharyngeal  Result Value Ref Range Status   MRSA by PCR NEGATIVE NEGATIVE Final    Comment:  The GeneXpert MRSA Assay (FDA approved for NASAL specimens only), is one component of a comprehensive MRSA colonization surveillance program. It is not intended to diagnose MRSA infection nor to guide or monitor treatment for MRSA infections. Performed at [facility], [place] [address] [place], [place] [address]   Culture, blood (single)     Status: None (Preliminary result)   Collection Time: 04/11/2020  1:53 PM   Specimen: BLOOD RIGHT HAND  Result Value Ref Range Status   Specimen Description   Final    BLOOD  RIGHT HAND Performed at [place] [address] [place], [place] [address]    Special Requests   Final    BOTTLES DRAWN AEROBIC AND ANAEROBIC Blood Culture results may not be optimal due to an inadequate volume of blood received in culture bottles Performed at [place] [address] [place], [place] [address]    Culture  Setup Time   Final    ANAEROBIC BOTTLE ONLY GRAM POSITIVE COCCI Organism ID to follow CRITICAL RESULT CALLED TO, READ BACK BY AND VERIFIED WITH: Concepcion Elk [id number] MLM Performed at [facility], [place] [address] [place], [place] [address]    Culture GRAM POSITIVE COCCI  Final   Report Status PENDING  Incomplete  Blood Culture ID Panel (Reflexed)     Status: Abnormal   Collection Time: 04/07/2020  1:53 PM  Result Value Ref Range Status   Enterococcus faecalis NOT DETECTED NOT DETECTED Final   Enterococcus Faecium NOT DETECTED NOT DETECTED Final   Listeria monocytogenes NOT DETECTED NOT DETECTED Final   Staphylococcus species DETECTED (A) NOT DETECTED Final    Comment: CRITICAL RESULT CALLED TO, READ BACK BY AND VERIFIED WITH: PHARMD M HICKS [id number] MLM    Staphylococcus aureus (BCID) DETECTED (A) NOT DETECTED Final    Comment: CRITICAL RESULT CALLED TO, READ BACK BY AND VERIFIED WITH: PHARMD M HICKS [id number] 0755 MLM    Staphylococcus epidermidis NOT DETECTED NOT DETECTED Final   Staphylococcus lugdunensis NOT DETECTED NOT DETECTED Final   Streptococcus species NOT DETECTED NOT DETECTED Final   Streptococcus agalactiae NOT DETECTED NOT DETECTED Final   Streptococcus pneumoniae NOT DETECTED NOT DETECTED Final   Streptococcus pyogenes NOT DETECTED NOT DETECTED Final   A.calcoaceticus-baumannii NOT DETECTED NOT DETECTED Final   Bacteroides fragilis NOT DETECTED NOT DETECTED Final   Enterobacterales NOT DETECTED NOT DETECTED Final   Enterobacter cloacae complex NOT DETECTED NOT DETECTED Final    Escherichia coli NOT DETECTED NOT DETECTED Final   Klebsiella aerogenes NOT DETECTED NOT DETECTED Final   Klebsiella oxytoca NOT DETECTED NOT DETECTED Final   Klebsiella pneumoniae NOT DETECTED NOT DETECTED Final   Proteus species NOT DETECTED NOT DETECTED Final   Salmonella species NOT DETECTED NOT DETECTED Final   Serratia marcescens NOT DETECTED NOT DETECTED Final   Haemophilus influenzae NOT DETECTED NOT DETECTED Final   Neisseria meningitidis NOT DETECTED NOT DETECTED Final   Pseudomonas aeruginosa NOT DETECTED NOT DETECTED Final   Stenotrophomonas maltophilia NOT DETECTED NOT DETECTED Final   Candida albicans NOT DETECTED NOT DETECTED Final   Candida auris NOT DETECTED NOT DETECTED Final   Candida glabrata NOT DETECTED NOT DETECTED Final   Candida krusei NOT DETECTED NOT DETECTED Final   Candida parapsilosis NOT DETECTED NOT DETECTED Final   Candida tropicalis NOT DETECTED NOT DETECTED Final   Cryptococcus neoformans/gattii NOT DETECTED NOT DETECTED Final   Meth resistant mecA/C and MREJ NOT DETECTED  NOT DETECTED Final    Comment: Performed at [facility], [place] [address] [place], [place] [address]  Culture, blood (Routine X 2) w Reflex to ID Panel     Status: None (Preliminary result)   Collection Time: 04/24/20  7:52 AM   Specimen: BLOOD LEFT HAND  Result Value Ref Range Status   Specimen Description   Final    BLOOD LEFT HAND Performed at [facility], [place] [address] [place], [place] [address]    Special Requests   Final    BOTTLES DRAWN AEROBIC AND ANAEROBIC BLOOD LEFT HAND Performed at [place] [address] [place], [place] [address]    Culture   Final    NO GROWTH 3 DAYS Performed at [facility], [place] [address], [place], [place] [address]    Report Status PENDING  Incomplete  Culture, blood (Routine X 2) w Reflex to ID Panel     Status: None (Preliminary result)   Collection Time: 04/24/20  7:52 AM   Specimen: BLOOD  LEFT HAND  Result Value Ref Range Status   Specimen Description   Final    BLOOD LEFT HAND Performed at [facility], [place] [address], [place], [place] [address]    Special Requests   Final    BOTTLES DRAWN AEROBIC AND ANAEROBIC BLOOD LEFT HAND Performed at [employer] [address], [place], [place] [address]    Culture   Final    NO GROWTH 3 DAYS Performed at [facility], [place] [address] [place], [place] [address]    Report Status PENDING  Incomplete  Aerobic/Anaerobic Culture (surgical/deep wound)     Status: None (Preliminary result)   Collection Time: 04/19/2020  2:48 PM   Specimen: PATH Other; Tissue  Result Value Ref Range Status   Specimen Description   Final    ABSCESS SHOULDER RIGHT Performed at [place] [address], [place], [place] [address]    Special Requests   Final    NONE Performed at [facility], [place] [address] [place], [place] [address]    Gram Stain   Final    FEW WBC PRESENT, PREDOMINANTLY PMN ABUNDANT GRAM POSITIVE COCCI Performed at [facility], [place] [address] [place], [place] [address]    Culture PENDING  Incomplete   Report Status PENDING  Incomplete  Aerobic/Anaerobic Culture (surgical/deep wound)     Status: None (Preliminary result)   Collection Time: 04/29/2020  2:58 PM   Specimen: PATH Other; Tissue  Result Value Ref Range Status   Specimen Description   Final    TISSUE SHOULDER RIGHT Performed at [place] [address], [place], [place] [address]    Special Requests   Final    NONE Performed at [facility], [place] [address] [place],  [address]    Gram Stain   Final    ABUNDANT WBC PRESENT, PREDOMINANTLY PMN ABUNDANT GRAM POSITIVE COCCI Performed at [facility], [place] [address] [place],  [address]    Culture PENDING  Incomplete   Report Status PENDING  Incomplete  Culture, blood  (Routine X 2) w Reflex to ID Panel     Status: None (Preliminary result)   Collection Time: 04/26/20  9:58 AM   Specimen: BLOOD RIGHT HAND  Result Value Ref Range Status   Specimen Description   Final    BLOOD RIGHT HAND  Performed at [facility], [place] [address] [place], [place] [address]    Special Requests   Final    BOTTLES DRAWN AEROBIC ONLY Blood Culture results may not be optimal due to an inadequate volume of blood received in culture bottles Performed at [place] [address], [place], [place] [address]    Culture   Final    NO GROWTH < 24 HOURS Performed at [place] [address] [place], [place] [address]    Report Status PENDING  Incomplete  Culture, blood (Routine X 2) w Reflex to ID Panel     Status: None (Preliminary result)   Collection Time: 04/26/20  9:58 AM   Specimen: BLOOD RIGHT HAND  Result Value Ref Range Status   Specimen Description   Final    BLOOD RIGHT HAND Performed at [place] [address] [address] [place], [place] [address]    Special Requests   Final    BOTTLES DRAWN AEROBIC ONLY Blood Culture results may not be optimal due to an inadequate volume of blood received in culture bottles Performed at [facility] [address], [place], [place] [address]    Culture   Final    NO GROWTH < 24 HOURS Performed at [facility] [address] [place], [place] [address]    Report Status PENDING  Incomplete         Radiology Studies: No results found.  Scheduled Meds:  chlorhexidine  15 mL Mouth Rinse BID   Chlorhexidine Gluconate Cloth  6 each Topical Daily   enoxaparin (LOVENOX) injection  60 mg Subcutaneous Q24H   insulin aspart  2-6 Units Subcutaneous TID WC   insulin aspart  7 Units Subcutaneous TID WC   insulin glargine  30 Units Subcutaneous Daily   mouth rinse  15 mL Mouth Rinse q12n4p   sodium chloride flush  10-40 mL  Intracatheter Q12H   Continuous Infusions:  nafcillin IV Stopped (04/05/2020 0518)   norepinephrine (LEVOPHED) Adult infusion Stopped (04/24/20 0439)     LOS: 5 days   Time spent: 31 min  Little Ishikawa, DO [facility]  If 7PM-7AM, please contact night-coverage [email or web address]  04/18/2020, 7:07 AM

## 2020-04-27 NOTE — Progress Notes (Signed)
eLink Physician-Brief Progress Note Patient Name: Jonathan Shaffer DOB: [birth year] MRN: [id number]   Date of Service  04/11/2020  HPI/Events of Note  Notified of episode of disorientation. Responding appropriately at this time. No focal deficit. Temp 100, not hypoglycemic. Tachycardic with adequate BP.  Head CT 11/19 without acute changes Surveillance culture no growth after 16 hours  eICU Interventions  MSSA bacteremia t/c endocarditis for TEE in am Septic encephalopathy vs ICU delirium.  Continue neurochecks and inform eLink if with focal deficit or if disorientation recurs or is persistent     Intervention Category Intermediate Interventions: Change in mental status - evaluation and management  [name] 04/15/2020, 2:20 AM

## 2020-04-27 NOTE — Evaluation (Signed)
Physical Therapy Evaluation Patient Details Name: Jonathan Shaffer MRN: [id number] DOB: 1966/01/25 Today's Date: 04/30/2020   History of Present Illness  Jonathan Shaffer is a 54 y.o. 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 For TEE.  Clinical Impression  The Patient did  Mobilize to sitting on bed edge requiring 2 persons and max assistance, reports pain in all extremities, declined to stand. Patient is  To go forTEE today. Patient requires assistance to elevate right UE. Patient can use the left UE  to  Assist with scooting to bed edge. Patient sat on bed edge x ~8 minutes. 2 persons required to assist in return to supine. HR 109, SPO2 RA 100%. Patient reports mild dizziness.  Wife present. Pt admitted with above diagnosis.  Pt currently with functional limitations due to the deficits listed below (see PT Problem List). Pt will benefit from skilled PT to increase their independence and safety with mobility to allow discharge to the venue listed below.       Follow Up Recommendations Home health PT;Supervision/Assistance - 24 hour    Equipment Recommendations   (TBA)    Recommendations for Other Services OT consult     Precautions / Restrictions Precautions Precautions: Fall Restrictions Weight Bearing Restrictions: No RUE Weight Bearing: Weight bearing as tolerated Other Position/Activity Restrictions: per  Dr. Erlinda Hong op note, no limits for ROM/activity      Mobility  Bed Mobility Overal bed mobility: Needs Assistance Bed Mobility: Supine to Sit;Sit to Supine     Supine to sit: Max assist;+2 for physical assistance;+2 for safety/equipment;HOB elevated Sit to supine: Max assist;+2 for physical assistance;+2 for safety/equipment    General bed mobility comments: Patient moved legs to bed edge, required assistance for  trunk, difficulty using arms with bilateral shoulder pain and S/P I and D on right shoulder. Max assistance for legs onto bed and control trunk back to supine.    Transfers                 General transfer comment: NT  Ambulation/Gait                Stairs            Wheelchair Mobility    Modified Rankin (Stroke Patients Only)       Balance Overall balance assessment: Needs assistance Sitting-balance support: Feet supported;No upper extremity supported Sitting balance-Leahy Scale: Fair                                       Pertinent Vitals/Pain Pain Assessment: Faces Faces Pain Scale: Hurts whole lot Pain Location: both shoulders, feet, hips and back Pain Descriptors / Indicators: Aching;Discomfort;Grimacing Pain Intervention(s): Monitored during session;Limited activity within patient's tolerance;Repositioned    Home Living Family/patient expects to be discharged to:: Private residence Living Arrangements: Spouse/significant other Available Help at Discharge: Family Type of Home: House Home Access: Stairs to enter     Home Layout: Two level;Able to live on main level with bedroom/bathroom Home Equipment: None      Prior Function Level of Independence: Independent               Hand Dominance        Extremity/Trunk Assessment  Upper Extremity Assessment Upper Extremity Assessment: Defer to OT evaluation (edema of both arms, able to reach overhead with lt, required asssitance to reach shoulder height on right.)    Lower Extremity Assessment Lower Extremity Assessment: LLE deficits/detail;RLE deficits/detail RLE Deficits / Details: edema throughout. Able to move legs to bed edge and over,  Required assistance for legs to be placed back onto bed. LLE Deficits / Details: same as right    Cervical / Trunk Assessment Cervical /  Trunk Assessment: Normal (rash noted on back, RN aware.)  Communication   Communication: No difficulties  Cognition Arousal/Alertness: Awake/alert Behavior During Therapy: WFL for tasks assessed/performed Overall Cognitive Status: Within Functional Limits for tasks assessed                                 General Comments: slow to respond, decreased cancentration      General Comments      Exercises     Assessment/Plan    PT Assessment Patient needs continued PT services  PT Problem List Decreased strength;Decreased range of motion;Decreased knowledge of use of DME;Decreased activity tolerance;Decreased safety awareness;Decreased balance;Decreased knowledge of precautions;Decreased mobility;Pain       PT Treatment Interventions DME instruction;Gait training;Stair training;Functional mobility training;Therapeutic activities;Therapeutic exercise;Patient/family education    PT Goals (Current goals can be found in the Care Plan section)  Acute Rehab PT Goals Patient Stated Goal: to get up. PT Goal Formulation: With patient/family Time For Goal Achievement: 05/11/20 Potential to Achieve Goals: Good    Frequency Min 3X/week   Barriers to discharge        Co-evaluation               AM-PAC PT "6 Clicks" Mobility  Outcome Measure Help needed turning from your back to your side while in a flat bed without using bedrails?: A Lot Help needed moving from lying on your back to sitting on the side of a flat bed without using bedrails?: A Lot Help needed moving to and from a bed to a chair (including a wheelchair)?: A Lot Help needed standing up from a chair using your arms (e.g., wheelchair or bedside chair)?: A Lot Help needed to walk in hospital room?: Total Help needed climbing 3-5 steps with a railing? : Total 6 Click Score: 10    End of Session   Activity Tolerance: Patient limited by fatigue;Patient limited by pain Patient left: in bed;with call  bell/phone within reach;with nursing/sitter in room Nurse Communication: Mobility status PT Visit Diagnosis: Difficulty in walking, not elsewhere classified (R26.2);Pain Pain - Right/Left: Right Pain - part of body: Shoulder    Time: 3419-3790 PT Time Calculation (min) (ACUTE ONLY): 18 min   Charges:   PT Evaluation $PT Eval Low Complexity: [facility] Acute Rehabilitation Services Pager [phone] Office [phone]   Claretha Cooper 04/22/2020, 10:00 AM

## 2020-04-27 NOTE — Anesthesia Preprocedure Evaluation (Addendum)
Anesthesia Evaluation  Patient identified by MRN, date of birth, ID band Patient awake    Reviewed: Allergy & Precautions, NPO status , Patient's Chart, lab work & pertinent test results  Airway Mallampati: II  TM Distance: >3 FB Neck ROM: Full    Dental no notable dental hx. (+) Teeth Intact, Dental Advisory Given   Pulmonary neg pulmonary ROS,    Pulmonary exam normal breath sounds clear to auscultation       Cardiovascular negative cardio ROS Normal cardiovascular exam Rhythm:Regular Rate:Normal  TTE 2021 1. Left ventricular ejection fraction, by estimation, is 55 to 60%. The  left ventricle has normal function. The left ventricle has no regional  wall motion abnormalities. Left ventricular diastolic function could not  be evaluated.  2. The mitral valve is grossly normal. No evidence of mitral valve  regurgitation. No evidence of mitral stenosis.  3. The aortic valve is tricuspid. Aortic valve regurgitation is not  visualized. No aortic stenosis is present.    Neuro/Psych negative neurological ROS  negative psych ROS   GI/Hepatic negative GI ROS, Neg liver ROS,   Endo/Other  negative endocrine ROSdiabetes  Renal/GU Renal InsufficiencyRenal disease (K 3.4, Cr 1.13)  negative genitourinary   Musculoskeletal negative musculoskeletal ROS (+)   Abdominal   Peds  Hematology  (+) Blood dyscrasia (Hgb 10.1), anemia ,   Anesthesia Other Findings TEE 2/2 sepsis and bacteremia   Reproductive/Obstetrics                            Anesthesia Physical Anesthesia Plan  ASA: III  Anesthesia Plan: MAC   Post-op Pain Management:    Induction: Intravenous  PONV Risk Score and Plan: Propofol infusion and Treatment may vary due to age or medical condition  Airway Management Planned: Natural Airway  Additional Equipment:   Intra-op Plan:   Post-operative Plan:   Informed Consent: I have  reviewed the patients History and Physical, chart, labs and discussed the procedure including the risks, benefits and alternatives for the proposed anesthesia with the patient or authorized representative who has indicated his/her understanding and acceptance.     Dental advisory given  Plan Discussed with: CRNA  Anesthesia Plan Comments:         Anesthesia Quick Evaluation

## 2020-04-27 NOTE — H&P (View-Only) (Signed)
eLink Physician-Brief Progress Note Patient Name: Jonathan Shaffer DOB: 1965-12-29 MRN: [id number]   Date of Service  04/19/2020  HPI/Events of Note  K 3.4 Creatinine 1.13  eICU Interventions  Ordered k 40 meqs PO Add on Mg level     Intervention Category Minor Interventions: Electrolytes abnormality - evaluation and management  [name] 05/03/2020, 3:56 AM

## 2020-04-27 NOTE — Interval H&P Note (Signed)
History and Physical Interval Note:  04/07/2020 12:08 PM  Jonathan Shaffer  has presented today for surgery, with the diagnosis of BACTEREMIA.  The various methods of treatment have been discussed with the patient and family. After consideration of risks, benefits and other options for treatment, the patient has consented to  Procedure(s): TRANSESOPHAGEAL ECHOCARDIOGRAM (TEE) (N/A) as a surgical intervention.  The patient's history has been reviewed, patient examined, no change in status, stable for surgery.  I have reviewed the patient's chart and labs.  Questions were answered to the patient's satisfaction.     [name]

## 2020-04-27 NOTE — Progress Notes (Signed)
  Echocardiogram Echocardiogram Transesophageal has been performed.  Jonathan Shaffer 04/25/2020, 1:13 PM

## 2020-04-27 NOTE — Progress Notes (Addendum)
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  Last fever today 100.9 Positive bcx 11/18 and 19; picc removed 11/19 Repeat bcx 11/21 and 11/23 ngtd    Antibiotics:  Anti-infectives (From admission, onward)   Start     Dose/Rate Route Frequency Ordered Stop   04/23/20 1200  nafcillin injection 2 g  Status:  Discontinued       Note to Pharmacy: Pharmacy may adjust as indicated   2 g Intravenous Every 4 hours 04/23/20 0954 04/23/20 1004   04/23/20 1200  nafcillin 2 g in sodium chloride 0.9 % 100 mL IVPB        2 g 200 mL/hr over 30 Minutes Intravenous Every 4 hours 04/23/20 1004     04/21/2020 1600  doxycycline (VIBRAMYCIN) 100 mg in sodium chloride 0.9 % 250 mL IVPB  Status:  Discontinued        100 mg 125 mL/hr over 120 Minutes Intravenous Every 12 hours 04/17/2020 1348 04/23/20 0954   04/26/2020 1500  vancomycin (VANCOCIN) IVPB 1000 mg/200 mL premix  Status:  Discontinued        1,000 mg 200 mL/hr over 60 Minutes Intravenous Every 12 hours 04/17/2020 1114 04/23/20 0954   04/05/2020 1200  ceFEPIme (MAXIPIME) 2 g in sodium chloride 0.9 % 100 mL IVPB  Status:  Discontinued        2 g 200 mL/hr over 30 Minutes Intravenous Every 8 hours 04/12/2020 0808 04/23/20 0954   04/21/2020 1000  vancomycin (VANCOREADY) IVPB 750 mg/150 mL  Status:  Discontinued        750 mg 150 mL/hr over 60 Minutes Intravenous Every 12 hours 04/17/2020 0547 04/09/2020 1114   04/28/2020 0200  ceFEPIme (MAXIPIME) 2 g in sodium chloride 0.9 % 100 mL IVPB        2 g 200 mL/hr over 30 Minutes Intravenous  Once 04/04/2020 0147 04/06/2020 0250   04/11/2020 0200   piperacillin-tazobactam (ZOSYN) IVPB 3.375 g        3.375 g 12.5 mL/hr over 240 Minutes Intravenous Once 04/18/2020 0147 04/04/2020 0359   04/13/2020 0200  vancomycin (VANCOCIN) IVPB 1000 mg/200 mL premix  Status:  Discontinued        1,000 mg 200 mL/hr over 60 Minutes Intravenous  Once 04/18/2020 0147 04/21/2020 0153   04/08/2020 0200  vancomycin (VANCOCIN) IVPB 1000 mg/200 mL premix        1,000 mg 200 mL/hr over 60 Minutes Intravenous Every 1 hr x 2 04/14/2020 0153 04/14/2020 0428      Medications: Scheduled Meds: . chlorhexidine  15 mL Mouth Rinse BID  . Chlorhexidine Gluconate Cloth  6 each Topical Daily  . enoxaparin (LOVENOX) injection  60 mg Subcutaneous Q24H  . insulin aspart  2-6 Units Subcutaneous TID WC  . insulin aspart  7 Units Subcutaneous TID WC  . insulin glargine  30 Units Subcutaneous Daily  . mouth rinse  15 mL Mouth Rinse q12n4p  . sodium chloride flush  10-40 mL Intracatheter Q12H   Continuous Infusions: . nafcillin IV 2 g (04/15/2020 1107)  . norepinephrine (LEVOPHED) Adult infusion Stopped (04/24/20 0439)   PRN Meds:.acetaminophen **OR** acetaminophen,  dextrose, fentaNYL (SUBLIMAZE) injection, lip balm, ondansetron **OR** ondansetron (ZOFRAN) IV, sodium chloride flush    Objective: Weight change:   Intake/Output Summary (Last 24 hours) at 04/16/2020 1520 Last data filed at 04/12/2020 1304 Gross per 24 hour  Intake 1010 ml  Output 3150 ml  Net -2140 ml   Blood pressure 127/67, pulse (!) 104, temperature 98.9 F (37.2 C), temperature source Oral, resp. rate (!) 29, height 6\' 3"  (1.905 m), weight 120.2 kg, SpO2 97 %. Temp:  [98.7 F (37.1 C)-100.9 F (38.3 C)] 98.9 F (37.2 C) (11/24 1427) Pulse Rate:  [102-115] 104 (11/24 1400) Resp:  [19-36] 29 (11/24 1400) BP: (102-198)/(42-140) 127/67 (11/24 1400) SpO2:  [94 %-100 %] 97 % (11/24 1400) Weight:  [120.2 kg] 120.2 kg (11/24 1159)  Physical Exam: General: Alert and awake, oriented x3, not in any acute  distress. Neuro: poor short term memory HEENT: anicteric sclera, EOMI CVS regular rate, normal no mgr hear Chest: clear, normal respiratory effort Abdomen: soft non-distended,  Msk: Right shoulder with dressing clean/dry skin: compared to the pictures taken below ?from 11/23 no changes on skin exam today 11/23            Neuro: nonfocal  CBC:    BMET Recent Labs    04/26/20 0958 04/26/2020 0228  NA 131* 131*  K 3.5 3.4*  CL 101 102  CO2 20* 22  GLUCOSE 218* 137*  BUN 22* 17  CREATININE 1.25* 1.13  CALCIUM 7.0* 7.0*     Liver Panel  Recent Labs    04/14/2020 0500 04/05/2020 0228  PROT 5.2* 5.3*  ALBUMIN 1.6* 1.6*  AST 24 20  ALT 36 21  ALKPHOS 91 73  BILITOT 2.3* 2.1*       Sedimentation Rate No results for input(s): ESRSEDRATE in the last 72 hours. C-Reactive Protein No results for input(s): CRP in the last 72 hours.  Micro Results: Recent Results (from the past 720 hour(s))  Blood Culture (routine x 2)     Status: Abnormal   Collection Time: 04/07/2020  2:00 AM   Specimen: BLOOD RIGHT HAND  Result Value Ref Range Status   Specimen Description   Final    BLOOD RIGHT HAND Performed at [facility], [address] [place], [place] [address]    Special Requests   Final    BOTTLES DRAWN AEROBIC AND ANAEROBIC Blood Culture adequate volume Performed at [facility], [address] [place], [place] [address]    Culture  Setup Time   Final    GRAM POSITIVE COCCI IN BOTH AEROBIC AND ANAEROBIC BOTTLES CRITICAL RESULT CALLED TO, READ BACK BY AND VERIFIED WITHSeward Meth [facility] 2109 04/16/2020 A BROWNING Performed at [facility], [place] [address], [place], [place] [address]    Culture STAPHYLOCOCCUS AUREUS (A)  Final   Report Status 04/24/2020 FINAL  Final   Organism ID, Bacteria STAPHYLOCOCCUS AUREUS  Final      Susceptibility   Staphylococcus aureus - MIC*    CIPROFLOXACIN <=0.5 SENSITIVE Sensitive     ERYTHROMYCIN  <=0.25 SENSITIVE Sensitive     GENTAMICIN <=0.5 SENSITIVE Sensitive     OXACILLIN 0.5 SENSITIVE Sensitive     TETRACYCLINE <=1 SENSITIVE Sensitive     VANCOMYCIN 1 SENSITIVE Sensitive     TRIMETH/SULFA <=10 SENSITIVE Sensitive     CLINDAMYCIN <=0.25 SENSITIVE Sensitive     RIFAMPIN <=0.5 SENSITIVE Sensitive     Inducible Clindamycin NEGATIVE Sensitive     *  STAPHYLOCOCCUS AUREUS  Blood Culture ID Panel (Reflexed)     Status: Abnormal   Collection Time: 04/27/2020  2:00 AM  Result Value Ref Range Status   Enterococcus faecalis NOT DETECTED NOT DETECTED Final   Enterococcus Faecium NOT DETECTED NOT DETECTED Final   Listeria monocytogenes NOT DETECTED NOT DETECTED Final   Staphylococcus species DETECTED (A) NOT DETECTED Final    Comment: CRITICAL RESULT CALLED TO, READ BACK BY AND VERIFIED WITH: N GOLGOVAC PHARMD 2109 04/30/2020 A BROWNING    Staphylococcus aureus (BCID) DETECTED (A) NOT DETECTED Final    Comment: CRITICAL RESULT CALLED TO, READ BACK BY AND VERIFIED WITH: N GOLGOVAC PHARMD 2109 04/07/2020 A BROWNING    Staphylococcus epidermidis NOT DETECTED NOT DETECTED Final   Staphylococcus lugdunensis NOT DETECTED NOT DETECTED Final   Streptococcus species NOT DETECTED NOT DETECTED Final   Streptococcus agalactiae NOT DETECTED NOT DETECTED Final   Streptococcus pneumoniae NOT DETECTED NOT DETECTED Final   Streptococcus pyogenes NOT DETECTED NOT DETECTED Final   A.calcoaceticus-baumannii NOT DETECTED NOT DETECTED Final   Bacteroides fragilis NOT DETECTED NOT DETECTED Final   Enterobacterales NOT DETECTED NOT DETECTED Final   Enterobacter cloacae complex NOT DETECTED NOT DETECTED Final   Escherichia coli NOT DETECTED NOT DETECTED Final   Klebsiella aerogenes NOT DETECTED NOT DETECTED Final   Klebsiella oxytoca NOT DETECTED NOT DETECTED Final   Klebsiella pneumoniae NOT DETECTED NOT DETECTED Final   Proteus species NOT DETECTED NOT DETECTED Final   Salmonella species NOT DETECTED NOT  DETECTED Final   Serratia marcescens NOT DETECTED NOT DETECTED Final   Haemophilus influenzae NOT DETECTED NOT DETECTED Final   Neisseria meningitidis NOT DETECTED NOT DETECTED Final   Pseudomonas aeruginosa NOT DETECTED NOT DETECTED Final   Stenotrophomonas maltophilia NOT DETECTED NOT DETECTED Final   Candida albicans NOT DETECTED NOT DETECTED Final   Candida auris NOT DETECTED NOT DETECTED Final   Candida glabrata NOT DETECTED NOT DETECTED Final   Candida krusei NOT DETECTED NOT DETECTED Final   Candida parapsilosis NOT DETECTED NOT DETECTED Final   Candida tropicalis NOT DETECTED NOT DETECTED Final   Cryptococcus neoformans/gattii NOT DETECTED NOT DETECTED Final   Meth resistant mecA/C and MREJ NOT DETECTED NOT DETECTED Final    Comment: Performed at [facility], [address] [place], [place] [address]  Urine culture     Status: Abnormal   Collection Time: 04/11/2020  2:02 AM   Specimen: In/Out Cath Urine  Result Value Ref Range Status   Specimen Description IN/OUT CATH URINE  Final   Special Requests NONE  Final   Culture 40,000 COLONIES/mL STAPHYLOCOCCUS AUREUS (A)  Final   Report Status 04/24/2020 FINAL  Final   Organism ID, Bacteria STAPHYLOCOCCUS AUREUS (A)  Final      Susceptibility   Staphylococcus aureus - MIC*    CIPROFLOXACIN <=0.5 SENSITIVE Sensitive     GENTAMICIN <=0.5 SENSITIVE Sensitive     NITROFURANTOIN 32 SENSITIVE Sensitive     OXACILLIN <=0.25 SENSITIVE Sensitive     TETRACYCLINE <=1 SENSITIVE Sensitive     VANCOMYCIN 1 SENSITIVE Sensitive     TRIMETH/SULFA <=10 SENSITIVE Sensitive     CLINDAMYCIN <=0.25 SENSITIVE Sensitive     RIFAMPIN <=0.5 SENSITIVE Sensitive     Inducible Clindamycin NEGATIVE Sensitive     * 40,000 COLONIES/mL STAPHYLOCOCCUS AUREUS  Resp Panel by RT-PCR (Flu A&B, Covid) Nasopharyngeal Swab     Status: None   Collection Time: 04/19/2020  2:05 AM   Specimen: Nasopharyngeal  Swab; Nasopharyngeal(NP) swabs in vial transport medium   Result Value Ref Range Status   SARS Coronavirus 2 by RT PCR NEGATIVE NEGATIVE Final    Comment: (NOTE) SARS-CoV-2 target nucleic acids are NOT DETECTED.  The SARS-CoV-2 RNA is generally detectable in upper respiratory specimens during the acute phase of infection. The lowest concentration of SARS-CoV-2 viral copies this assay can detect is 138 copies/mL. A negative result does not preclude SARS-Cov-2 infection and should not be used as the sole basis for treatment or other patient management decisions. A negative result may occur with  improper specimen collection/handling, submission of specimen other than nasopharyngeal swab, presence of viral mutation(s) within the areas targeted by this assay, and inadequate number of viral copies(<138 copies/mL). A negative result must be combined with clinical observations, patient history, and epidemiological information. The expected result is Negative.  Fact Sheet for Patients:  EntrepreneurPulse.com.au  Fact Sheet for Healthcare Providers:  IncredibleEmployment.be  This test is no t yet approved or cleared by the [place] FDA and  has been authorized for detection and/or diagnosis of SARS-CoV-2 by FDA under an Emergency Use Authorization (EUA). This EUA will remain  in effect (meaning this test can be used) for the duration of the COVID-19 declaration under Section 564(b)(1) of the Act, 21 U.S.C.section 360bbb-3(b)(1), unless the authorization is terminated  or revoked sooner.       Influenza A by PCR NEGATIVE NEGATIVE Final   Influenza B by PCR NEGATIVE NEGATIVE Final    Comment: (NOTE) The Xpert Xpress SARS-CoV-2/FLU/RSV plus assay is intended as an aid in the diagnosis of influenza from Nasopharyngeal swab specimens and should not be used as a sole basis for treatment. Nasal washings and aspirates are unacceptable for Xpert Xpress SARS-CoV-2/FLU/RSV testing.  Fact Sheet for  Patients: EntrepreneurPulse.com.au  Fact Sheet for Healthcare Providers: IncredibleEmployment.be  This test is not yet approved or cleared by the [place] FDA and has been authorized for detection and/or diagnosis of SARS-CoV-2 by FDA under an Emergency Use Authorization (EUA). This EUA will remain in effect (meaning this test can be used) for the duration of the COVID-19 declaration under Section 564(b)(1) of the Act, 21 U.S.C. section 360bbb-3(b)(1), unless the authorization is terminated or revoked.  Performed at [facility], [address] [place], [place] [address]   Blood Culture (routine x 2)     Status: Abnormal   Collection Time: 04/23/2020  2:06 AM   Specimen: BLOOD LEFT FOREARM  Result Value Ref Range Status   Specimen Description   Final    BLOOD LEFT FOREARM Performed at [facility], [address] [place], [place] [address]    Special Requests   Final    BOTTLES DRAWN AEROBIC AND ANAEROBIC Blood Culture adequate volume Performed at [facility], [address] [place], [place] [address]    Culture  Setup Time   Final    GRAM POSITIVE COCCI IN BOTH AEROBIC AND ANAEROBIC BOTTLES CRITICAL VALUE NOTED.  VALUE IS CONSISTENT WITH PREVIOUSLY REPORTED AND CALLED VALUE.    Culture (A)  Final    STAPHYLOCOCCUS AUREUS SUSCEPTIBILITIES PERFORMED ON PREVIOUS CULTURE WITHIN THE LAST 5 DAYS. Performed at [facility], [place] [address], [place], [place] [address]    Report Status 04/24/2020 FINAL  Final  MRSA PCR Screening     Status: None   Collection Time: 04/29/2020  6:24 AM   Specimen: Nasopharyngeal  Result Value Ref Range Status  MRSA by PCR NEGATIVE NEGATIVE Final    Comment:        The GeneXpert MRSA Assay (FDA approved for NASAL specimens only), is one component of a comprehensive MRSA colonization surveillance program. It is not intended to diagnose MRSA infection nor to guide  or monitor treatment for MRSA infections. Performed at [facility], [place] [address] [place], [place] [address]   Culture, blood (single)     Status: Abnormal (Preliminary result)   Collection Time: 04/23/2020  1:53 PM   Specimen: BLOOD RIGHT HAND  Result Value Ref Range Status   Specimen Description   Final    BLOOD RIGHT HAND Performed at [place] [address] [place], [place] [address]    Special Requests   Final    BOTTLES DRAWN AEROBIC AND ANAEROBIC Blood Culture results may not be optimal due to an inadequate volume of blood received in culture bottles Performed at [place] [address] [place], [place] [address]    Culture  Setup Time   Final    ANAEROBIC BOTTLE ONLY GRAM POSITIVE COCCI CRITICAL RESULT CALLED TO, READ BACK BY AND VERIFIED WITH: PHARMD M HICKS [id number] 0755 MLM    Culture (A)  Final    STAPHYLOCOCCUS AUREUS SUSCEPTIBILITIES TO FOLLOW Performed at [facility], [place] [address], [place], [place] [address]    Report Status PENDING  Incomplete  Blood Culture ID Panel (Reflexed)     Status: Abnormal   Collection Time: 04/27/2020  1:53 PM  Result Value Ref Range Status   Enterococcus faecalis NOT DETECTED NOT DETECTED Final   Enterococcus Faecium NOT DETECTED NOT DETECTED Final   Listeria monocytogenes NOT DETECTED NOT DETECTED Final   Staphylococcus species DETECTED (A) NOT DETECTED Final    Comment: CRITICAL RESULT CALLED TO, READ BACK BY AND VERIFIED WITH: PHARMD M HICKS [id number] 0755 MLM    Staphylococcus aureus (BCID) DETECTED (A) NOT DETECTED Final    Comment: CRITICAL RESULT CALLED TO, READ BACK BY AND VERIFIED WITH: PHARMD M HICKS [id number] 0755 MLM    Staphylococcus epidermidis NOT DETECTED NOT DETECTED Final   Staphylococcus lugdunensis NOT DETECTED NOT DETECTED Final   Streptococcus species NOT DETECTED NOT DETECTED Final   Streptococcus agalactiae NOT DETECTED NOT DETECTED Final    Streptococcus pneumoniae NOT DETECTED NOT DETECTED Final   Streptococcus pyogenes NOT DETECTED NOT DETECTED Final   A.calcoaceticus-baumannii NOT DETECTED NOT DETECTED Final   Bacteroides fragilis NOT DETECTED NOT DETECTED Final   Enterobacterales NOT DETECTED NOT DETECTED Final   Enterobacter cloacae complex NOT DETECTED NOT DETECTED Final   Escherichia coli NOT DETECTED NOT DETECTED Final   Klebsiella aerogenes NOT DETECTED NOT DETECTED Final   Klebsiella oxytoca NOT DETECTED NOT DETECTED Final   Klebsiella pneumoniae NOT DETECTED NOT DETECTED Final   Proteus species NOT DETECTED NOT DETECTED Final   Salmonella species NOT DETECTED NOT DETECTED Final   Serratia marcescens NOT DETECTED NOT DETECTED Final   Haemophilus influenzae NOT DETECTED NOT DETECTED Final   Neisseria meningitidis NOT DETECTED NOT DETECTED Final   Pseudomonas aeruginosa NOT DETECTED NOT DETECTED Final   Stenotrophomonas maltophilia NOT DETECTED NOT DETECTED Final   Candida albicans NOT DETECTED NOT DETECTED Final   Candida auris NOT DETECTED NOT DETECTED Final   Candida glabrata NOT DETECTED NOT DETECTED Final   Candida krusei NOT DETECTED NOT DETECTED Final   Candida parapsilosis NOT DETECTED NOT DETECTED Final   Candida tropicalis NOT DETECTED NOT  DETECTED Final   Cryptococcus neoformans/gattii NOT DETECTED NOT DETECTED Final   Meth resistant mecA/C and MREJ NOT DETECTED NOT DETECTED Final    Comment: Performed at [facility], [place] [address], [place], [place] [address]  Culture, blood (Routine X 2) w Reflex to ID Panel     Status: None (Preliminary result)   Collection Time: 04/24/20  7:52 AM   Specimen: BLOOD LEFT HAND  Result Value Ref Range Status   Specimen Description   Final    BLOOD LEFT HAND Performed at [facility], [place] [address] [place], [place] [address]    Special Requests   Final    BOTTLES DRAWN AEROBIC AND ANAEROBIC BLOOD LEFT HAND Performed at [place] [address], [place], [place] [address]    Culture   Final    NO GROWTH 3 DAYS Performed at [facility], [place] [address], [place], [place] [address]    Report Status PENDING  Incomplete  Culture, blood (Routine X 2) w Reflex to ID Panel     Status: None (Preliminary result)   Collection Time: 04/24/20  7:52 AM   Specimen: BLOOD LEFT HAND  Result Value Ref Range Status   Specimen Description   Final    BLOOD LEFT HAND Performed at [facility], [place] [address] [place], [place] [address]    Special Requests   Final    BOTTLES DRAWN AEROBIC AND ANAEROBIC BLOOD LEFT HAND Performed at [place] [address] [place], [place] [address]    Culture   Final    NO GROWTH 3 DAYS Performed at [facility], [place] [address], [place], [place] [address]    Report Status PENDING  Incomplete  Aerobic/Anaerobic Culture (surgical/deep wound)     Status: None (Preliminary result)   Collection Time: 04/06/2020  2:48 PM   Specimen: PATH Other; Tissue  Result Value Ref Range Status   Specimen Description   Final    ABSCESS SHOULDER RIGHT Performed at [place] [address], [place], [place] [address]    Special Requests   Final    NONE Performed at [facility], [place] [address] [place], [place] [address]    Gram Stain   Final    FEW WBC PRESENT, PREDOMINANTLY PMN ABUNDANT GRAM POSITIVE COCCI    Culture   Final    MODERATE STAPHYLOCOCCUS AUREUS SUSCEPTIBILITIES TO FOLLOW Performed at [facility], [place] [address], [place], [place] [address]    Report Status PENDING  Incomplete  Aerobic/Anaerobic Culture (surgical/deep wound)     Status: None (Preliminary result)   Collection Time: 04/24/2020  2:58 PM   Specimen: PATH Other; Tissue  Result Value Ref Range Status   Specimen Description   Final    TISSUE SHOULDER RIGHT Performed at [place] [address],  [place], [place] [address]    Special Requests   Final    NONE Performed at [facility], [place] [address] [place], [place] [address]    Gram Stain   Final    ABUNDANT WBC PRESENT, PREDOMINANTLY PMN ABUNDANT GRAM POSITIVE COCCI    Culture   Final    ABUNDANT STAPHYLOCOCCUS AUREUS SUSCEPTIBILITIES TO FOLLOW Performed at [facility], [place] [address] [place],  [address]    Report Status PENDING  Incomplete  Culture, blood (Routine X 2) w Reflex to ID Panel  Status: None (Preliminary result)   Collection Time: 04/26/20  9:58 AM   Specimen: BLOOD RIGHT HAND  Result Value Ref Range Status   Specimen Description   Final    BLOOD RIGHT HAND Performed at [place] [address], [place], [place] [address]    Special Requests   Final    BOTTLES DRAWN AEROBIC ONLY Blood Culture results may not be optimal due to an inadequate volume of blood received in culture bottles Performed at [facility] [address], [place], [place] [address]    Culture   Final    NO GROWTH < 24 HOURS Performed at [place] [address], [place], [place] [address]    Report Status PENDING  Incomplete  Culture, blood (Routine X 2) w Reflex to ID Panel     Status: None (Preliminary result)   Collection Time: 04/26/20  9:58 AM   Specimen: BLOOD RIGHT HAND  Result Value Ref Range Status   Specimen Description   Final    BLOOD RIGHT HAND Performed at [place] [address], [place], [place] [address]    Special Requests   Final    BOTTLES DRAWN AEROBIC ONLY Blood Culture results may not be optimal due to an inadequate volume of blood received in culture bottles Performed at [place] [address] [place], [place] [address]    Culture   Final    NO GROWTH < 24 HOURS Performed at [place] [address], [place],  [address]    Report Status PENDING   Incomplete    Studies/Results: 11/24 tee 1. There is a large echodensity that is partially fixed (below the valve  on the ventricular side and possibly representing an abscess) and  partially hypermobile, multilobar ( on the left atrial side) consistent  with a vegetation with high risk for  embolization. This involves P2,3 and A3 leaflets. Vegetation size is 4.4 x  2.4 cm.  2. Left ventricular ejection fraction, by estimation, is 55 to 60%. The  left ventricle has normal function. The left ventricle has no regional  wall motion abnormalities. Left ventricular diastolic function could not  be evaluated.  3. Right ventricular systolic function is normal. The right ventricular  size is normal.  4. No left atrial/left atrial appendage thrombus was detected.  5. The mitral valve is normal in structure. Mild mitral valve  regurgitation. No evidence of mitral stenosis.  6. The aortic valve is normal in structure. Aortic valve regurgitation is  not visualized. No aortic stenosis is present.  7. The inferior vena cava is normal in size with greater than 50%  respiratory variability, suggesting right atrial pressure of 3 mmHg.   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 male hx testicular cancer s/p right orchiectomy, dm, admitted 11/19 with sepsis/oligoarthritis/rash found to have clinical MSSA sepsis with left sided endocarditis with septic emboli to CNS, spleen, [name] hand, foot, septic right shoulder, sp I&D, transferred to [facility] for further management after 11/24 tee revealed large MV vegetation and probable perivalvular abscess  W/u so far: 11/19 ct abd pelv multiple small infarcts upper lobe of spleen 11/20 mri right shoulder setpic ac joint with OM distal calvicale and acromion  and  intradeltoid abscess and subacromial bursitis 11/20 MRI left wrist without septic joint moniitor site 11/20 Mri brain bilateral hemispheric and cerebellar infarcts 11/18 and 11/19 positive bcx; 11/21 and 11/23 bcx repeat in progress His PICC has been removed 11/24 tee large 4.4 mv vegetation and probable perivalvular abscess; no mvr however  He meets indication for urgent/early valve surgery given tee finding and size of the abscess along with ongoing fever and multiple septic emboli  I called CTS office who relate that Dr Huntley Estelle will see patient either this evening or tomorrow morning. Updated family   -I will discuss general surgery regarding splenic infarct as well. There are cases that might need splenectomy (Splenic infarction and abscess in the setting of infective endocarditis. Infect Dis Clin Pract. 2007;15:17) -keep npo for now pending CTS evaluation -continue nafcillin; duration at least 6 weeks from last persistent negative blood cx or surgical I&D of any osseous process, whichever is later -pending CTS evaluation for perivalvular abscess and mv large vegetation of 4.4 cm  ------------- Addendum: I spoke with general surgery who discussed that to continue treatment and that they don't do splenectomy in these cases abscess/infarct. They will not see patient -however, if ongoing sepsis despite all else addressed, will need to call general surgery again if no other source outside of the spleen   LOS: 5 days   Vayla Wilhelmi T Miah Boye 04/08/2020, 3:20 PM

## 2020-04-28 DIAGNOSIS — I340 Nonrheumatic mitral (valve) insufficiency: Secondary | ICD-10-CM

## 2020-04-28 DIAGNOSIS — L02413 Cutaneous abscess of right upper limb: Secondary | ICD-10-CM | POA: Diagnosis not present

## 2020-04-28 DIAGNOSIS — Z9079 Acquired absence of other genital organ(s): Secondary | ICD-10-CM

## 2020-04-28 DIAGNOSIS — I38 Endocarditis, valve unspecified: Secondary | ICD-10-CM | POA: Diagnosis not present

## 2020-04-28 DIAGNOSIS — Z8547 Personal history of malignant neoplasm of testis: Secondary | ICD-10-CM

## 2020-04-28 LAB — CULTURE, BLOOD (SINGLE)

## 2020-04-28 LAB — GLUCOSE, CAPILLARY
Glucose-Capillary: 190 mg/dL — ABNORMAL HIGH (ref 70–99)
Glucose-Capillary: 205 mg/dL — ABNORMAL HIGH (ref 70–99)
Glucose-Capillary: 186 mg/dL — ABNORMAL HIGH (ref 70–99)
Glucose-Capillary: 165 mg/dL — ABNORMAL HIGH (ref 70–99)

## 2020-04-28 LAB — CBC
HCT: 30.9 % — ABNORMAL LOW (ref 39.0–52.0)
Hemoglobin: 10.2 g/dL — ABNORMAL LOW (ref 13.0–17.0)
MCH: 29.7 pg (ref 26.0–34.0)
MCHC: 33 g/dL (ref 30.0–36.0)
MCV: 89.8 fL (ref 80.0–100.0)
Platelets: 436 10*3/uL — ABNORMAL HIGH (ref 150–400)
RBC: 3.44 MIL/uL — ABNORMAL LOW (ref 4.22–5.81)
RDW: 15.4 % (ref 11.5–15.5)
WBC: 16 10*3/uL — ABNORMAL HIGH (ref 4.0–10.5)
nRBC: 0 % (ref 0.0–0.2)

## 2020-04-28 LAB — COMPREHENSIVE METABOLIC PANEL
ALT: 20 U/L (ref 0–44)
AST: 24 U/L (ref 15–41)
Albumin: 1.3 g/dL — ABNORMAL LOW (ref 3.5–5.0)
Alkaline Phosphatase: 83 U/L (ref 38–126)
Anion gap: 11 (ref 5–15)
BUN: 14 mg/dL (ref 6–20)
CO2: 19 mmol/L — ABNORMAL LOW (ref 22–32)
Calcium: 7.3 mg/dL — ABNORMAL LOW (ref 8.9–10.3)
Chloride: 104 mmol/L (ref 98–111)
Creatinine, Ser: 1.38 mg/dL — ABNORMAL HIGH (ref 0.61–1.24)
GFR, Estimated: >60 mL/min (ref >60)
Glucose, Bld: 188 mg/dL — ABNORMAL HIGH (ref 70–99)
Potassium: 3.9 mmol/L (ref 3.5–5.1)
Sodium: 134 mmol/L — ABNORMAL LOW (ref 135–145)
Total Bilirubin: 1.7 mg/dL — ABNORMAL HIGH (ref 0.3–1.2)
Total Protein: 5.5 g/dL — ABNORMAL LOW (ref 6.5–8.1)

## 2020-04-28 LAB — LACTIC ACID, PLASMA: Lactic Acid, Venous: 1.4 mmol/L (ref 0.5–1.9)

## 2020-04-28 MED ORDER — ASPIRIN 81 MG PO CHEW
81.0000 mg | CHEWABLE_TABLET | ORAL | Status: AC
  Administered 2020-04-29: 81 mg via ORAL
  Filled 2020-04-28: qty 1

## 2020-04-28 MED ORDER — SODIUM CHLORIDE 0.9% FLUSH: 3.0000 mL | Freq: Two times a day (BID) | INTRAVENOUS | Status: DC

## 2020-04-28 MED ORDER — SODIUM CHLORIDE 0.9% FLUSH: 3.0000 mL | INTRAVENOUS | Status: DC | PRN

## 2020-04-28 MED ORDER — SODIUM CHLORIDE 0.9 % IV SOLN: INTRAVENOUS | Status: DC

## 2020-04-28 MED ORDER — ALPRAZOLAM 0.5 MG PO TABS
0.5000 mg | ORAL_TABLET | Freq: Every evening | ORAL | Status: DC | PRN
  Administered 2020-04-28 – 2020-04-30 (×2): 0.5 mg via ORAL
  Filled 2020-04-28 (×2): qty 1

## 2020-04-28 MED ORDER — SODIUM CHLORIDE 0.9 % IV SOLN: 250.0000 mL | INTRAVENOUS | Status: DC | PRN

## 2020-04-28 NOTE — Progress Notes (Signed)
   04/25/2020 1946  Assess: MEWS Score  Temp 100 F (37.8 C)  BP 124/75  ECG Heart Rate (!) 116  Resp (!) 28  Level of Consciousness Alert  SpO2 97 %  O2 Device Room Air  Assess: MEWS Score  MEWS Temp 0  MEWS Systolic 0  MEWS Pulse 2  MEWS RR 2  MEWS LOC 0  MEWS Score 4  MEWS Score Color Red  Assess: if the MEWS score is Yellow or Red  Were vital signs taken at a resting state? Yes  Focused Assessment No change from prior assessment  Early Detection of Sepsis Score *See Row Information* Low  MEWS guidelines implemented *See Row Information* Yes  Treat  MEWS Interventions Administered prn meds/treatments  Pain Scale 0-10  Pain Score 7  Pain Type Acute pain  Pain Location Generalized  Pain Descriptors / Indicators Aching  Pain Intervention(s) Medication (See eMAR)  Take Vital Signs  Increase Vital Sign Frequency  Red: Q 1hr X 4 then Q 4hr X 4, if remains red, continue Q 4hrs  Escalate  MEWS: Escalate Red: discuss with charge nurse/RN and provider, consider discussing with RRT  Notify: Charge Nurse/RN  Name of Charge Nurse/RN Notified Houstonia, RN  Date Charge Nurse/RN Notified 04/04/2020  Time Charge Nurse/RN Notified 1948  Notify: Provider  Provider Name/Title chotiner, md  Date Provider Notified 04/30/2020  Time Provider Notified 2137  Notification Type Page  Notification Reason Other (Comment) (red mews after prn)  Response See new orders  Date of Provider Response 04/24/2020  Time of Provider Response 2140  Document  Patient Outcome Not stable and remains on department (hr and rr stil elevated)  Progress note created (see row info) Yes   Patient red MEWS due to elevated HR, RR, and TR.  Patient states he is in pain.  Pain meds given and red MEWS protocol initiated. Patient remains a red MEWS with pain improved.  Patient HR and RR have been elevated this admission.  MD notified.  Orders placed for LR bolus and continuous fluids.

## 2020-04-28 NOTE — H&P (View-Only) (Signed)
Cardiology Consultation:   Patient ID: Jonathan Shaffer MRN: [id number]; DOB: Sep 15, 1965  Admit date: 04/08/2020 Date of Consult: 04/28/2020  Primary Care Provider: Patient, No Pcp Per [facility] Cardiologist: Candee Furbish, MD  [facility] Electrophysiologist:  None    Patient Profile:   Jonathan Shaffer is a 54 y.o. 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 Orvan Seen.  History of Present Illness:   Jonathan Shaffer was hospitalized 04/14/2020 with AMS found to have diabetic ketoacidosis and septic. He recently became ill after eating shrimp cocktail (1 week prior to admission). A few days later, developed right shoulder pain. He was seen in the ER and recommended to see PCP. He ultimately received steroid injection in his right shoulder. He initially felt better, but then worsened with pain in his hips and thighs. PCP prescribed prednisone taper, but he presented to [facility]. CT abdomen showed multiple splenic infarcts. He was also found to be in DKA due to untreated DM. He has a history of testicular cancer, no IVDU.   Infection source felt to be septic arthritis of right acromioclavicular join, deep abscess right shoulder, osteomyelitis of distal clavicle, for which he underwent I&D 04/15/2020 with ortho. In addition, he was found to have left wrist cellulitis.    Due to plenic infarcts and hand/foot skin lesions, he completed TEE to rule out endocarditis.   TTE 04/12/2020 with normal EF, and no significant valvular dysfunction. TEE on 04/12/2020 did show mitral valve endocarditis and abscess. CT surgery was consulted. Dr. Orvan Seen requested cardiology consult for heart cath prior to surgical valve repair vs replace.   On my interview, the patient denies prior cardiac history. He denies hx of MI, stroke, and prior PCI. He  does not smoke cigarettes, but has a high stress job as a Hydrologist. He golfs and on occasion does walk - no angina during activity. No formal exercise. He has a strong family history of heart disease, including father who had CABG in his mid-50s. He denies orthopnea, but does have mild swelling in B LE, likely due to IVF/IV ABX.   Past Medical History:  Diagnosis Date  . Cancer ([place])   . Diabetes mellitus without complication ([place])   . High cholesterol   . Testicle cancer ([place])   . Thyroid disease   . Urticaria    Recurrent idiopathic urticaria in 2018    Past Surgical History:  Procedure Laterality Date  . MINOR IRRIGATION AND DEBRIDEMENT OF WOUND Right 04/15/2020   Procedure: MINOR IRRIGATION AND DEBRIDEMENT OF WOUND;  Surgeon: Leandrew Koyanagi, MD;  Location: [facility];  Service: Orthopedics;  Laterality: Right;  . ORCHIECTOMY       Home Medications:  Prior to Admission medications   Medication Sig Start Date End Date Taking? Authorizing Provider  HYDROcodone-acetaminophen (NORCO/VICODIN) 5-325 MG tablet Take 1 tablet by mouth every 6 (six) hours as needed for moderate pain.   Yes [provider]  lidocaine (LIDODERM) 5 % Place 1 patch onto the skin daily. Remove & Discard patch within 12 hours or as directed by MD 04/17/20  Yes Tacy Learn, PA-C  meloxicam (MOBIC) 7.5 MG tablet Take 7.5 mg by mouth daily.   Yes [provider]  predniSONE (DELTASONE) 10 MG tablet Take as directed for 12 days.  Daily dose 6,6,5,5,4,4,3,3,2,2,1,1. Patient taking  differently: Take 10-60 mg by mouth See admin instructions. Take as directed for 12 days.  Daily dose 6,6,5,5,4,4,3,3,2,2,1,1. 04/21/20  Yes Hilts, Legrand Como, MD  traMADol (ULTRAM) 50 MG tablet Take 1-2 tablets (50-100 mg total) by mouth every 6 (six) hours as needed for moderate pain. 04/19/20  Yes Hilts, Michael, MD    Inpatient Medications: Scheduled Meds: . chlorhexidine  15 mL Mouth Rinse BID  . Chlorhexidine Gluconate Cloth   6 each Topical Daily  . enoxaparin (LOVENOX) injection  60 mg Subcutaneous Q24H  . insulin aspart  2-6 Units Subcutaneous TID WC  . insulin aspart  7 Units Subcutaneous TID WC  . insulin glargine  30 Units Subcutaneous Daily  . mouth rinse  15 mL Mouth Rinse q12n4p  . sodium chloride flush  10-40 mL Intracatheter Q12H   Continuous Infusions: . lactated ringers 125 mL/hr at 04/28/20 0253  . nafcillin IV 2 g (04/28/20 0811)  . norepinephrine (LEVOPHED) Adult infusion Stopped (04/24/20 0439)   PRN Meds: acetaminophen **OR** acetaminophen, dextrose, fentaNYL (SUBLIMAZE) injection, lip balm, ondansetron **OR** ondansetron (ZOFRAN) IV, sodium chloride flush  Allergies:   No Known Allergies  Social History:   Social History   Socioeconomic History  . Marital status: Married    Spouse name: Not on file  . Number of children: Not on file  . Years of education: Not on file  . Highest education level: Not on file  Occupational History  . Not on file  Tobacco Use  . Smoking status: Never Smoker  . Smokeless tobacco: Never Used  Vaping Use  . Vaping Use: Never used  Substance and Sexual Activity  . Alcohol use: Yes  . Drug use: Never  . Sexual activity: Not on file  Other Topics Concern  . Not on file  Social History Narrative  . Not on file   Social Determinants of Health   Financial Resource Strain:   . Difficulty of Paying Living Expenses: Not on file  Food Insecurity:   . Worried About Charity fundraiser in the Last Year: Not on file  . Ran Out of Food in the Last Year: Not on file  Transportation Needs:   . Lack of Transportation (Medical): Not on file  . Lack of Transportation (Non-Medical): Not on file  Physical Activity:   . Days of Exercise per Week: Not on file  . Minutes of Exercise per Session: Not on file  Stress:   . Feeling of Stress : Not on file  Social Connections:   . Frequency of Communication with Friends and Family: Not on file  . Frequency of  Social Gatherings with Friends and Family: Not on file  . Attends Religious Services: Not on file  . Active Member of Clubs or Organizations: Not on file  . Attends Archivist Meetings: Not on file  . Marital Status: Not on file  Intimate Partner Violence:   . Fear of Current or Ex-Partner: Not on file  . Emotionally Abused: Not on file  . Physically Abused: Not on file  . Sexually Abused: Not on file    Family History:    Family History  Problem Relation Age of Onset  . Diabetes Father   . Hypertension Father   . Heart disease Father   . Hyperlipidemia Father      ROS:  Please see the history of present illness.   All other ROS reviewed and negative.     Physical Exam/Data:   Vitals:   04/28/20  0200 04/28/20 0430 04/28/20 0803 04/28/20 1032  BP: 125/73 (!) 135/92 116/77   Pulse: (!) 105 98 (!) 110   Resp: (!) 30 (!) 24 20   Temp:  98.3 F (36.8 C) 99.2 F (37.3 C) 99.7 F (37.6 C)  TempSrc:  Oral Oral Oral  SpO2: 97% 99% 98%   Weight:  128.9 kg    Height:        Intake/Output Summary (Last 24 hours) at 04/28/2020 1103 Last data filed at 04/28/2020 0933 Gross per 24 hour  Intake 3018.62 ml  Output 2575 ml  Net 443.62 ml   Last 3 Weights 04/28/2020 04/19/2020 05/01/2020  Weight (lbs) 284 lb 2.8 oz 264 lb 15.9 oz 265 lb  Weight (kg) 128.9 kg 120.2 kg 120.203 kg     Body mass index is 35.52 kg/m.  General:  Mildly obese male in NAD HEENT: normal Neck: no JVD Vascular: No carotid bruits Cardiac:  normal S1, S2; RRR; no murmur  Lungs:  clear to auscultation bilaterally, no wheezing, rhonchi or rales  Abd: soft, nontender, no hepatomegaly  Ext: mild B LE edema Musculoskeletal:  No deformities, BUE and BLE strength normal and equal Skin: warm and dry  Neuro:  CNs 2-12 intact, no focal abnormalities noted Psych:  Normal affect   EKG:  The EKG was personally reviewed and demonstrates:  Sinus tachycardia HR  133, PR 120 Telemetry:  Telemetry was  personally reviewed and demonstrates:  Sinus rhythm to sinus tachycardia HR in the 90-100s  Relevant CV Studies:  Echo 04/08/2020: 1. Left ventricular ejection fraction, by estimation, is 55 to 60%. The  left ventricle has normal function. The left ventricle has no regional  wall motion abnormalities. Left ventricular diastolic function could not  be evaluated.  2. The mitral valve is grossly normal. No evidence of mitral valve  regurgitation. No evidence of mitral stenosis.  3. The aortic valve is tricuspid. Aortic valve regurgitation is not  visualized. No aortic stenosis is present.    TEE 04/18/2020: 1. There is a large echodensity that is partially fixed (below the valve  on the ventricular side and possibly representing an abscess) and  partially hypermobile, multilobar ( on the left atrial side) consistent  with a vegetation with high risk for  embolization. This involves P2,3 and A3 leaflets. Vegetation size is 4.4 x  2.4 cm.  2. Left ventricular ejection fraction, by estimation, is 55 to 60%. The  left ventricle has normal function. The left ventricle has no regional  wall motion abnormalities. Left ventricular diastolic function could not  be evaluated.  3. Right ventricular systolic function is normal. The right ventricular  size is normal.  4. No left atrial/left atrial appendage thrombus was detected.  5. The mitral valve is normal in structure. Mild mitral valve  regurgitation. No evidence of mitral stenosis.  6. The aortic valve is normal in structure. Aortic valve regurgitation is  not visualized. No aortic stenosis is present.  7. The inferior vena cava is normal in size with greater than 50%  respiratory variability, suggesting right atrial pressure of 3 mmHg.    Laboratory Data:  High Sensitivity Troponin:  No results for input(s): TROPONINIHS in the last 720 hours.   Chemistry Recent Labs  Lab 04/26/20 0958 04/20/2020 0228 04/28/20 0014  NA 131*  131* 134*  K 3.5 3.4* 3.9  CL 101 102 104  CO2 20* 22 19*  GLUCOSE 218* 137* 188*  BUN 22* 17 14  CREATININE 1.25*  1.13 1.38*  CALCIUM 7.0* 7.0* 7.3*  GFRNONAA >60 >60 >60  ANIONGAP 10 7 11     Recent Labs  Lab 04/05/2020 0500 04/17/2020 0228 04/28/20 0014  PROT 5.2* 5.3* 5.5*  ALBUMIN 1.6* 1.6* 1.3*  AST 24 20 24   ALT 36 21 20  ALKPHOS 91 73 83  BILITOT 2.3* 2.1* 1.7*   Hematology Recent Labs  Lab 04/26/20 0635 04/30/2020 0228 04/28/20 0014  WBC 15.0* 16.3* 16.0*  RBC 3.50* 3.44* 3.44*  HGB 10.5* 10.1* 10.2*  HCT 32.1* 31.4* 30.9*  MCV 91.7 91.3 89.8  MCH 30.0 29.4 29.7  MCHC 32.7 32.2 33.0  RDW 15.4 15.2 15.4  PLT 281 347 436*   BNPNo results for input(s): BNP, PROBNP in the last 168 hours.  DDimer No results for input(s): DDIMER in the last 168 hours.   Radiology/Studies:  DG Wrist Complete Left  Result Date: 04/24/2020 CLINICAL DATA:  Left wrist pain. EXAM: LEFT WRIST - COMPLETE 3+ VIEW COMPARISON:  None. FINDINGS: There is no evidence of fracture or dislocation. There is no evidence of arthropathy or other focal bone abnormality. Soft tissues are unremarkable. IMPRESSION: Negative. Electronically Signed   By: Dorise Bullion III M.D   On: 04/24/2020 15:42   MR WRIST LEFT WO CONTRAST  Result Date: 04/24/2020 CLINICAL DATA:  Question of infection, MSSA bacteremia EXAM: MR OF THE LEFT WRIST WITHOUT CONTRAST TECHNIQUE: Multiplanar, multisequence MR imaging of the left wrist was performed. No intravenous contrast was administered. COMPARISON:  None. FINDINGS: The study is limited due to patient motion. Bones/Joint/Cartilage No definite areas of cortical destruction or periosteal reaction. Normal osseous marrow signal is seen throughout. There is an ulnar minus variance. No large joint effusions are seen. Ligaments Somewhat limited, however the TFCC appears to be intact. The scapholunate and lunotriquetral ligaments are grossly intact. Muscles and Tendons The muscles  surrounding the wrist are normal appearance without focal atrophy or tear. The flexor and extensor tendons are intact. Soft tissue Dorsal subcutaneous edema is seen surrounding the wrist. No focal fluid collections or subcutaneous emphysema. IMPRESSION: Study is somewhat limited due to patient motion. Diffuse dorsal subcutaneous edema and findings of cellulitis. No definite soft tissue abscess or evidence of osteomyelitis. Electronically Signed   By: Prudencio Pair M.D.   On: 04/24/2020 20:36   ECHO TEE  Result Date: 04/23/2020    TRANSESOPHOGEAL ECHO REPORT   Patient Name:   Jonathan Shaffer Date of Exam: 04/25/2020 Medical Rec #:  [id number]      Height:       75.0 in Accession #:    [id number]     Weight:       265.0 lb Date of Birth:  [birth year]     BSA:          2.473 m Patient Age:    73 years       BP:           146/114 mmHg Patient Gender: M              HR:           104 bpm. Exam Location:  Inpatient Procedure: Transesophageal Echo Indications:    Bacteremia 790.7 / R78.81  History:        Patient has prior history of Echocardiogram examinations, most                 recent 04/18/2020. Risk Factors:Diabetes.  Sonographer:    Bernadene Person RDCS Referring Phys: [phone] [place]  H FURTH PROCEDURE: After discussion of the risks and benefits of a TEE, an informed consent was obtained from the patient. The transesophogeal probe was passed without difficulty through the esophogus of the patient. Local oropharyngeal anesthetic was provided with Cetacaine. Sedation performed by different physician. The patient was monitored while under deep sedation. Anesthestetic sedation was provided intravenously by Anesthesiology: 400.5mg  of Propofol, 100mg  of Lidocaine. The patient's vital signs; including heart rate, blood pressure, and oxygen saturation; remained stable throughout the procedure. The patient developed no complications during the procedure. IMPRESSIONS  1. There is a large echodensity that is partially fixed  (below the valve on the ventricular side and possibly representing an abscess) and partially hypermobile, multilobar ( on the left atrial side) consistent with a vegetation with high risk for embolization. This involves P2,3 and A3 leaflets. Vegetation size is 4.4 x 2.4 cm.  2. Left ventricular ejection fraction, by estimation, is 55 to 60%. The left ventricle has normal function. The left ventricle has no regional wall motion abnormalities. Left ventricular diastolic function could not be evaluated.  3. Right ventricular systolic function is normal. The right ventricular size is normal.  4. No left atrial/left atrial appendage thrombus was detected.  5. The mitral valve is normal in structure. Mild mitral valve regurgitation. No evidence of mitral stenosis.  6. The aortic valve is normal in structure. Aortic valve regurgitation is not visualized. No aortic stenosis is present.  7. The inferior vena cava is normal in size with greater than 50% respiratory variability, suggesting right atrial pressure of 3 mmHg. Conclusion(s)/Recommendation(s): Normal biventricular function without evidence of hemodynamically significant valvular heart disease. CT surgery and hospitalist Dr Avon Gully were notified at 1:10 pm.The patient will be transferred to the [facility].  FINDINGS  Left Ventricle: Left ventricular ejection fraction, by estimation, is 55 to 60%. The left ventricle has normal function. The left ventricle has no regional wall motion abnormalities. The left ventricular internal cavity size was normal in size. There is  no left ventricular hypertrophy. Left ventricular diastolic function could not be evaluated. Right Ventricle: The right ventricular size is normal. No increase in right ventricular wall thickness. Right ventricular systolic function is normal. Left Atrium: Left atrial size was normal in size. No left atrial/left atrial appendage thrombus was detected. Right Atrium: Right atrial size was normal in size.  Pericardium: There is no evidence of pericardial effusion. Mitral Valve: There is a large echodensity that is partially fixed (below the valve on the ventricular side and possibly representing an abscess) and partially hypermobile, multilobar ( on the left atrial side) consistent with a vegetation with high risk  for embolization. This involves P2,3 and A3 leaflets. The mitral valve is normal in structure. Mild mitral valve regurgitation. No evidence of mitral valve stenosis. Tricuspid Valve: The tricuspid valve is normal in structure. Tricuspid valve regurgitation is mild . No evidence of tricuspid stenosis. There is no evidence of tricuspid valve vegetation. Aortic Valve: The aortic valve is normal in structure. Aortic valve regurgitation is not visualized. No aortic stenosis is present. There is no evidence of aortic valve vegetation. Pulmonic Valve: The pulmonic valve was normal in structure. Pulmonic valve regurgitation is not visualized. No evidence of pulmonic stenosis. There is no evidence of pulmonic valve vegetation. Aorta: The aortic root is normal in size and structure. There is minimal (Grade I) plaque. Venous: The inferior vena cava is normal in size with greater than 50% respiratory variability, suggesting right atrial pressure of 3 mmHg. IAS/Shunts: The  interatrial septum appears to be lipomatous. No atrial level shunt detected by color flow Doppler. There is no evidence of an atrial septal defect. [name] MD Electronically signed by [name] MD Signature Date/Time: 04/20/2020/1:29:48 PM    Final      Assessment and Plan:   Mitral valve endocarditis  - suspect secondary to septic arthritis, possible intramuscular abscess, clavicular osteomyelitis - pt has been evaluated by CT surgery, who has requested right and left heart cath prior to surgical valve replacement vs repair - he denies anginal symptoms - CRF for CAD include DM, obesity, and strong family history (father and  grandfather) - will proceed with heart cath tomorrow  The patient understands that risks included but are not limited to stroke (1 in 90), death (1 in 72), kidney failure [usually temporary] (1 in 500), bleeding (1 in 200), allergic reaction [possibly serious] (1 in 200). He has some baseline confusion due to hospital delirium - wife Glenard Haring is at bedside and also consents to the procedure.   Risk factor modification - will collect fasting lipids tomorrow morning - sinus to sinus tachycardia - will add low dose BB   Encephalopathy - improving - MRI brain with small acute infarcts bilateral parietal lobes, left fontal lobe, and left occipitotemporal lobe, bilateral cerebellum   DM DKA - resolved - A1c 11.1% - per primary   Left wrist cellulitis Right shoulder septic arthritis, OM clavicle - ABX per primary/ID   Acute renal injury - sCr 1.38 - will proceed with right and left heart cath and minimize contrast, no LV gram - no additional IVF as he is mildly hypervolemic now - is able to lie flat in bed - creatinine normal in 2019, per Care Everywhere     For questions or updates, please contact [facility] Please consult [email or web address] for contact info under    Signed, Ledora Bottcher, PA  04/28/2020 11:03 AM   Personally seen and examined. Agree with above.   54 year old male with mitral valve endocarditis.  Dr. Orvan Seen requested heart catheterization.  Laying in bed, wife at bedside.  Mildly ill-appearing.  Ice pack on right shoulder.  Slightly increased swelling in left arm.  Creatinine 1.38.  TEE reviewed as above.  Large vegetation mitral valve, hypermobile.  Normal EF.  Assessment and plan  MSSA bacteremia, native valve endocarditis mitral valve with septic arthritis splenic infarcts.  -Plan for cardiac catheterization hopefully tomorrow.  Risk and benefits have been explained as above.  Right heart catheterization should be reasonable given no evidence  of tricuspid valve lesion.  Would avoid left ventriculogram given mitral valve endocarditis.  Father had CABG in his mid 49s.  -Continued treatment per infectious disease for MSSA endocarditis.  Questions answered from he and wife.  Candee Furbish, MD

## 2020-04-28 NOTE — Consult Note (Addendum)
Cardiology Consultation:   Patient ID: Jonathan Shaffer MRN: [id number]; DOB: 1966/03/02  Admit date: 04/24/2020 Date of Consult: 04/28/2020  Primary Care Provider: Patient, No Pcp Per [facility] Cardiologist: Candee Furbish, MD  [facility] Electrophysiologist:  None    Patient Profile:   Jonathan Shaffer is a 54 y.o. 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 Orvan Seen.  History of Present Illness:   Jonathan Shaffer was hospitalized 04/10/2020 with AMS found to have diabetic ketoacidosis and septic. He recently became ill after eating shrimp cocktail (1 week prior to admission). A few days later, developed right shoulder pain. He was seen in the ER and recommended to see PCP. He ultimately received steroid injection in his right shoulder. He initially felt better, but then worsened with pain in his hips and thighs. PCP prescribed prednisone taper, but he presented to [facility]. CT abdomen showed multiple splenic infarcts. He was also found to be in DKA due to untreated DM. He has a history of testicular cancer, no IVDU.   Infection source felt to be septic arthritis of right acromioclavicular join, deep abscess right shoulder, osteomyelitis of distal clavicle, for which he underwent I&D 04/12/2020 with ortho. In addition, he was found to have left wrist cellulitis.    Due to plenic infarcts and hand/foot skin lesions, he completed TEE to rule out endocarditis.   TTE 04/04/2020 with normal EF, and no significant valvular dysfunction. TEE on 04/08/2020 did show mitral valve endocarditis and abscess. CT surgery was consulted. Dr. Orvan Seen requested cardiology consult for heart cath prior to surgical valve repair vs replace.   On my interview, the patient denies prior cardiac history. He denies hx of MI, stroke, and prior PCI. He  does not smoke cigarettes, but has a high stress job as a Hydrologist. He golfs and on occasion does walk - no angina during activity. No formal exercise. He has a strong family history of heart disease, including father who had CABG in his mid-50s. He denies orthopnea, but does have mild swelling in B LE, likely due to IVF/IV ABX.   Past Medical History:  Diagnosis Date  . Cancer ([place])   . Diabetes mellitus without complication ([place])   . High cholesterol   . Testicle cancer ([place])   . Thyroid disease   . Urticaria    Recurrent idiopathic urticaria in 2018    Past Surgical History:  Procedure Laterality Date  . MINOR IRRIGATION AND DEBRIDEMENT OF WOUND Right 04/21/2020   Procedure: MINOR IRRIGATION AND DEBRIDEMENT OF WOUND;  Surgeon: Leandrew Koyanagi, MD;  Location: [facility];  Service: Orthopedics;  Laterality: Right;  . ORCHIECTOMY       Home Medications:  Prior to Admission medications   Medication Sig Start Date End Date Taking? Authorizing Provider  HYDROcodone-acetaminophen (NORCO/VICODIN) 5-325 MG tablet Take 1 tablet by mouth every 6 (six) hours as needed for moderate pain.   Yes [provider]  lidocaine (LIDODERM) 5 % Place 1 patch onto the skin daily. Remove & Discard patch within 12 hours or as directed by MD 04/17/20  Yes Tacy Learn, PA-C  meloxicam (MOBIC) 7.5 MG tablet Take 7.5 mg by mouth daily.   Yes [provider]  predniSONE (DELTASONE) 10 MG tablet Take as directed for 12 days.  Daily dose 6,6,5,5,4,4,3,3,2,2,1,1. Patient taking  differently: Take 10-60 mg by mouth See admin instructions. Take as directed for 12 days.  Daily dose 6,6,5,5,4,4,3,3,2,2,1,1. 04/21/20  Yes Hilts, Legrand Como, MD  traMADol (ULTRAM) 50 MG tablet Take 1-2 tablets (50-100 mg total) by mouth every 6 (six) hours as needed for moderate pain. 04/19/20  Yes Hilts, Michael, MD    Inpatient Medications: Scheduled Meds: . chlorhexidine  15 mL Mouth Rinse BID  . Chlorhexidine Gluconate Cloth   6 each Topical Daily  . enoxaparin (LOVENOX) injection  60 mg Subcutaneous Q24H  . insulin aspart  2-6 Units Subcutaneous TID WC  . insulin aspart  7 Units Subcutaneous TID WC  . insulin glargine  30 Units Subcutaneous Daily  . mouth rinse  15 mL Mouth Rinse q12n4p  . sodium chloride flush  10-40 mL Intracatheter Q12H   Continuous Infusions: . lactated ringers 125 mL/hr at 04/28/20 0253  . nafcillin IV 2 g (04/28/20 0811)  . norepinephrine (LEVOPHED) Adult infusion Stopped (04/24/20 0439)   PRN Meds: acetaminophen **OR** acetaminophen, dextrose, fentaNYL (SUBLIMAZE) injection, lip balm, ondansetron **OR** ondansetron (ZOFRAN) IV, sodium chloride flush  Allergies:   No Known Allergies  Social History:   Social History   Socioeconomic History  . Marital status: Married    Spouse name: Not on file  . Number of children: Not on file  . Years of education: Not on file  . Highest education level: Not on file  Occupational History  . Not on file  Tobacco Use  . Smoking status: Never Smoker  . Smokeless tobacco: Never Used  Vaping Use  . Vaping Use: Never used  Substance and Sexual Activity  . Alcohol use: Yes  . Drug use: Never  . Sexual activity: Not on file  Other Topics Concern  . Not on file  Social History Narrative  . Not on file   Social Determinants of Health   Financial Resource Strain:   . Difficulty of Paying Living Expenses: Not on file  Food Insecurity:   . Worried About Charity fundraiser in the Last Year: Not on file  . Ran Out of Food in the Last Year: Not on file  Transportation Needs:   . Lack of Transportation (Medical): Not on file  . Lack of Transportation (Non-Medical): Not on file  Physical Activity:   . Days of Exercise per Week: Not on file  . Minutes of Exercise per Session: Not on file  Stress:   . Feeling of Stress : Not on file  Social Connections:   . Frequency of Communication with Friends and Family: Not on file  . Frequency of  Social Gatherings with Friends and Family: Not on file  . Attends Religious Services: Not on file  . Active Member of Clubs or Organizations: Not on file  . Attends Archivist Meetings: Not on file  . Marital Status: Not on file  Intimate Partner Violence:   . Fear of Current or Ex-Partner: Not on file  . Emotionally Abused: Not on file  . Physically Abused: Not on file  . Sexually Abused: Not on file    Family History:    Family History  Problem Relation Age of Onset  . Diabetes Father   . Hypertension Father   . Heart disease Father   . Hyperlipidemia Father      ROS:  Please see the history of present illness.   All other ROS reviewed and negative.     Physical Exam/Data:   Vitals:   04/28/20  0200 04/28/20 0430 04/28/20 0803 04/28/20 1032  BP: 125/73 (!) 135/92 116/77   Pulse: (!) 105 98 (!) 110   Resp: (!) 30 (!) 24 20   Temp:  98.3 F (36.8 C) 99.2 F (37.3 C) 99.7 F (37.6 C)  TempSrc:  Oral Oral Oral  SpO2: 97% 99% 98%   Weight:  128.9 kg    Height:        Intake/Output Summary (Last 24 hours) at 04/28/2020 1103 Last data filed at 04/28/2020 0933 Gross per 24 hour  Intake 3018.62 ml  Output 2575 ml  Net 443.62 ml   Last 3 Weights 04/28/2020 04/12/2020 04/26/2020  Weight (lbs) 284 lb 2.8 oz 264 lb 15.9 oz 265 lb  Weight (kg) 128.9 kg 120.2 kg 120.203 kg     Body mass index is 35.52 kg/m.  General:  Mildly obese male in NAD HEENT: normal Neck: no JVD Vascular: No carotid bruits Cardiac:  normal S1, S2; RRR; no murmur  Lungs:  clear to auscultation bilaterally, no wheezing, rhonchi or rales  Abd: soft, nontender, no hepatomegaly  Ext: mild B LE edema Musculoskeletal:  No deformities, BUE and BLE strength normal and equal Skin: warm and dry  Neuro:  CNs 2-12 intact, no focal abnormalities noted Psych:  Normal affect   EKG:  The EKG was personally reviewed and demonstrates:  Sinus tachycardia HR  133, PR 120 Telemetry:  Telemetry was  personally reviewed and demonstrates:  Sinus rhythm to sinus tachycardia HR in the 90-100s  Relevant CV Studies:  Echo 04/05/2020: 1. Left ventricular ejection fraction, by estimation, is 55 to 60%. The  left ventricle has normal function. The left ventricle has no regional  wall motion abnormalities. Left ventricular diastolic function could not  be evaluated.  2. The mitral valve is grossly normal. No evidence of mitral valve  regurgitation. No evidence of mitral stenosis.  3. The aortic valve is tricuspid. Aortic valve regurgitation is not  visualized. No aortic stenosis is present.    TEE 04/11/2020: 1. There is a large echodensity that is partially fixed (below the valve  on the ventricular side and possibly representing an abscess) and  partially hypermobile, multilobar ( on the left atrial side) consistent  with a vegetation with high risk for  embolization. This involves P2,3 and A3 leaflets. Vegetation size is 4.4 x  2.4 cm.  2. Left ventricular ejection fraction, by estimation, is 55 to 60%. The  left ventricle has normal function. The left ventricle has no regional  wall motion abnormalities. Left ventricular diastolic function could not  be evaluated.  3. Right ventricular systolic function is normal. The right ventricular  size is normal.  4. No left atrial/left atrial appendage thrombus was detected.  5. The mitral valve is normal in structure. Mild mitral valve  regurgitation. No evidence of mitral stenosis.  6. The aortic valve is normal in structure. Aortic valve regurgitation is  not visualized. No aortic stenosis is present.  7. The inferior vena cava is normal in size with greater than 50%  respiratory variability, suggesting right atrial pressure of 3 mmHg.    Laboratory Data:  High Sensitivity Troponin:  No results for input(s): TROPONINIHS in the last 720 hours.   Chemistry Recent Labs  Lab 04/26/20 0958 04/20/2020 0228 04/28/20 0014  NA 131*  131* 134*  K 3.5 3.4* 3.9  CL 101 102 104  CO2 20* 22 19*  GLUCOSE 218* 137* 188*  BUN 22* 17 14  CREATININE 1.25*  1.13 1.38*  CALCIUM 7.0* 7.0* 7.3*  GFRNONAA >60 >60 >60  ANIONGAP 10 7 11     Recent Labs  Lab 04/04/2020 0500 04/05/2020 0228 04/28/20 0014  PROT 5.2* 5.3* 5.5*  ALBUMIN 1.6* 1.6* 1.3*  AST 24 20 24   ALT 36 21 20  ALKPHOS 91 73 83  BILITOT 2.3* 2.1* 1.7*   Hematology Recent Labs  Lab 04/26/20 0635 05/03/2020 0228 04/28/20 0014  WBC 15.0* 16.3* 16.0*  RBC 3.50* 3.44* 3.44*  HGB 10.5* 10.1* 10.2*  HCT 32.1* 31.4* 30.9*  MCV 91.7 91.3 89.8  MCH 30.0 29.4 29.7  MCHC 32.7 32.2 33.0  RDW 15.4 15.2 15.4  PLT 281 347 436*   BNPNo results for input(s): BNP, PROBNP in the last 168 hours.  DDimer No results for input(s): DDIMER in the last 168 hours.   Radiology/Studies:  DG Wrist Complete Left  Result Date: 04/24/2020 CLINICAL DATA:  Left wrist pain. EXAM: LEFT WRIST - COMPLETE 3+ VIEW COMPARISON:  None. FINDINGS: There is no evidence of fracture or dislocation. There is no evidence of arthropathy or other focal bone abnormality. Soft tissues are unremarkable. IMPRESSION: Negative. Electronically Signed   By: Dorise Bullion III M.D   On: 04/24/2020 15:42   MR WRIST LEFT WO CONTRAST  Result Date: 04/24/2020 CLINICAL DATA:  Question of infection, MSSA bacteremia EXAM: MR OF THE LEFT WRIST WITHOUT CONTRAST TECHNIQUE: Multiplanar, multisequence MR imaging of the left wrist was performed. No intravenous contrast was administered. COMPARISON:  None. FINDINGS: The study is limited due to patient motion. Bones/Joint/Cartilage No definite areas of cortical destruction or periosteal reaction. Normal osseous marrow signal is seen throughout. There is an ulnar minus variance. No large joint effusions are seen. Ligaments Somewhat limited, however the TFCC appears to be intact. The scapholunate and lunotriquetral ligaments are grossly intact. Muscles and Tendons The muscles  surrounding the wrist are normal appearance without focal atrophy or tear. The flexor and extensor tendons are intact. Soft tissue Dorsal subcutaneous edema is seen surrounding the wrist. No focal fluid collections or subcutaneous emphysema. IMPRESSION: Study is somewhat limited due to patient motion. Diffuse dorsal subcutaneous edema and findings of cellulitis. No definite soft tissue abscess or evidence of osteomyelitis. Electronically Signed   By: Prudencio Pair M.D.   On: 04/24/2020 20:36   ECHO TEE  Result Date: 04/12/2020    TRANSESOPHOGEAL ECHO REPORT   Patient Name:   Jonathan Shaffer Date of Exam: 04/26/2020 Medical Rec #:  [id number]      Height:       75.0 in Accession #:    [id number]     Weight:       265.0 lb Date of Birth:  02/18/1966     BSA:          2.473 m Patient Age:    51 years       BP:           146/114 mmHg Patient Gender: M              HR:           104 bpm. Exam Location:  Inpatient Procedure: Transesophageal Echo Indications:    Bacteremia 790.7 / R78.81  History:        Patient has prior history of Echocardiogram examinations, most                 recent 04/29/2020. Risk Factors:Diabetes.  Sonographer:    Bernadene Person RDCS Referring Phys: [phone] [place]  H FURTH PROCEDURE: After discussion of the risks and benefits of a TEE, an informed consent was obtained from the patient. The transesophogeal probe was passed without difficulty through the esophogus of the patient. Local oropharyngeal anesthetic was provided with Cetacaine. Sedation performed by different physician. The patient was monitored while under deep sedation. Anesthestetic sedation was provided intravenously by Anesthesiology: 400.5mg  of Propofol, 100mg  of Lidocaine. The patient's vital signs; including heart rate, blood pressure, and oxygen saturation; remained stable throughout the procedure. The patient developed no complications during the procedure. IMPRESSIONS  1. There is a large echodensity that is partially fixed  (below the valve on the ventricular side and possibly representing an abscess) and partially hypermobile, multilobar ( on the left atrial side) consistent with a vegetation with high risk for embolization. This involves P2,3 and A3 leaflets. Vegetation size is 4.4 x 2.4 cm.  2. Left ventricular ejection fraction, by estimation, is 55 to 60%. The left ventricle has normal function. The left ventricle has no regional wall motion abnormalities. Left ventricular diastolic function could not be evaluated.  3. Right ventricular systolic function is normal. The right ventricular size is normal.  4. No left atrial/left atrial appendage thrombus was detected.  5. The mitral valve is normal in structure. Mild mitral valve regurgitation. No evidence of mitral stenosis.  6. The aortic valve is normal in structure. Aortic valve regurgitation is not visualized. No aortic stenosis is present.  7. The inferior vena cava is normal in size with greater than 50% respiratory variability, suggesting right atrial pressure of 3 mmHg. Conclusion(s)/Recommendation(s): Normal biventricular function without evidence of hemodynamically significant valvular heart disease. CT surgery and hospitalist Dr Avon Gully were notified at 1:10 pm.The patient will be transferred to [facility].  FINDINGS  Left Ventricle: Left ventricular ejection fraction, by estimation, is 55 to 60%. The left ventricle has normal function. The left ventricle has no regional wall motion abnormalities. The left ventricular internal cavity size was normal in size. There is  no left ventricular hypertrophy. Left ventricular diastolic function could not be evaluated. Right Ventricle: The right ventricular size is normal. No increase in right ventricular wall thickness. Right ventricular systolic function is normal. Left Atrium: Left atrial size was normal in size. No left atrial/left atrial appendage thrombus was detected. Right Atrium: Right atrial size was normal in size.  Pericardium: There is no evidence of pericardial effusion. Mitral Valve: There is a large echodensity that is partially fixed (below the valve on the ventricular side and possibly representing an abscess) and partially hypermobile, multilobar ( on the left atrial side) consistent with a vegetation with high risk  for embolization. This involves P2,3 and A3 leaflets. The mitral valve is normal in structure. Mild mitral valve regurgitation. No evidence of mitral valve stenosis. Tricuspid Valve: The tricuspid valve is normal in structure. Tricuspid valve regurgitation is mild . No evidence of tricuspid stenosis. There is no evidence of tricuspid valve vegetation. Aortic Valve: The aortic valve is normal in structure. Aortic valve regurgitation is not visualized. No aortic stenosis is present. There is no evidence of aortic valve vegetation. Pulmonic Valve: The pulmonic valve was normal in structure. Pulmonic valve regurgitation is not visualized. No evidence of pulmonic stenosis. There is no evidence of pulmonic valve vegetation. Aorta: The aortic root is normal in size and structure. There is minimal (Grade I) plaque. Venous: The inferior vena cava is normal in size with greater than 50% respiratory variability, suggesting right atrial pressure of 3 mmHg. IAS/Shunts: The  interatrial septum appears to be lipomatous. No atrial level shunt detected by color flow Doppler. There is no evidence of an atrial septal defect. [name] MD Electronically signed by [name] MD Signature Date/Time: 04/10/2020/1:29:48 PM    Final      Assessment and Plan:   Mitral valve endocarditis  - suspect secondary to septic arthritis, possible intramuscular abscess, clavicular osteomyelitis - pt has been evaluated by CT surgery, who has requested right and left heart cath prior to surgical valve replacement vs repair - he denies anginal symptoms - CRF for CAD include DM, obesity, and strong family history (father and  grandfather) - will proceed with heart cath tomorrow  The patient understands that risks included but are not limited to stroke (1 in 47), death (1 in 8), kidney failure [usually temporary] (1 in 500), bleeding (1 in 200), allergic reaction [possibly serious] (1 in 200). He has some baseline confusion due to hospital delirium - wife Glenard Haring is at bedside and also consents to the procedure.   Risk factor modification - will collect fasting lipids tomorrow morning - sinus to sinus tachycardia - will add low dose BB   Encephalopathy - improving - MRI brain with small acute infarcts bilateral parietal lobes, left fontal lobe, and left occipitotemporal lobe, bilateral cerebellum   DM DKA - resolved - A1c 11.1% - per primary   Left wrist cellulitis Right shoulder septic arthritis, OM clavicle - ABX per primary/ID   Acute renal injury - sCr 1.38 - will proceed with right and left heart cath and minimize contrast, no LV gram - no additional IVF as he is mildly hypervolemic now - is able to lie flat in bed - creatinine normal in 2019, per Care Everywhere     For questions or updates, please contact [facility] Please consult [email or web address] for contact info under    Signed, Ledora Bottcher, PA  04/28/2020 11:03 AM   Personally seen and examined. Agree with above.   54 year old male with mitral valve endocarditis.  Dr. Orvan Seen requested heart catheterization.  Laying in bed, wife at bedside.  Mildly ill-appearing.  Ice pack on right shoulder.  Slightly increased swelling in left arm.  Creatinine 1.38.  TEE reviewed as above.  Large vegetation mitral valve, hypermobile.  Normal EF.  Assessment and plan  MSSA bacteremia, native valve endocarditis mitral valve with septic arthritis splenic infarcts.  -Plan for cardiac catheterization hopefully tomorrow.  Risk and benefits have been explained as above.  Right heart catheterization should be reasonable given no evidence  of tricuspid valve lesion.  Would avoid left ventriculogram given mitral valve endocarditis.  Father had CABG in his mid 62s.  -Continued treatment per infectious disease for MSSA endocarditis.  Questions answered from he and wife.  Candee Furbish, MD

## 2020-04-28 NOTE — Consult Note (Signed)
[address]       [place] [address]             [phone]        Jonathan Shaffer [place] Medical Record #[id number] Date of Birth: 05/14/1966  Referring: No ref. provider found Primary Care: Patient, No Pcp Per Primary Cardiologist:Mark Marlou Porch, MD  Chief Complaint:   No chief complaint on file. Malaise  History of Present Illness:      54 year old gentleman was in his usual state of health until an episode approximately 10 days ago where he had sudden onset nausea vomiting and chills.  This was followed by right shoulder pain and immobility.  He ultimately presented with sepsis and DKA.  He was found to have MSSA in the blood.  Patient was transferred [facility] to [facility] yesterday and had an echocardiogram demonstrating mitral valve endocarditis with perivalvular abscess.  His last positive blood culture was from 3 days ago.  He denies a history of stroke.  His presentation was worrisome for new onset diabetes.  He denies history of heart failure.  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  Current Activity/ Functional Status: Patient will be independent with mobility/ambulation, transfers, ADL's, IADL's.   Zubrod Score: At the time of surgery this patient's most appropriate activity status/level should be described as: []     0    Normal activity, no symptoms []     1    Restricted in physical strenuous activity but ambulatory, able to do out light work []     2    Ambulatory and capable of self care, unable to do work activities, up and about                 more than 50%  Of the time                            []     3    Only limited self care, in bed greater than 50% of waking hours []     4    Completely disabled, no self care, confined to bed or chair []     5    Moribund  Past Medical History:  Diagnosis Date  . Cancer ([place])   . Diabetes mellitus without complication ([place])   . High cholesterol     . 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 Thyroid disease   . Urticaria    Recurrent idiopathic urticaria in 2018    Past Surgical History:  Procedure Laterality Date  . MINOR IRRIGATION AND DEBRIDEMENT OF WOUND Right 04/06/2020   Procedure: MINOR IRRIGATION AND DEBRIDEMENT OF WOUND;  Surgeon: Leandrew Koyanagi, MD;  Location: [facility];  Service: Orthopedics;  Laterality: Right;  . ORCHIECTOMY      Social History   Tobacco Use  Smoking Status Never Smoker  Smokeless Tobacco Never Used    Social History   Substance and Sexual Activity  Alcohol Use Yes     No Known Allergies  Current Facility-Administered Medications  Medication Dose Route Frequency Provider Last Rate Last Admin  . acetaminophen (TYLENOL) tablet 650 mg  650 mg Oral Q6H PRN Dorothy Spark, MD   650 mg at 04/28/20 1117   Or  . acetaminophen (TYLENOL) suppository 650 mg  650 mg Rectal Q6H PRN Dorothy Spark, MD      .  chlorhexidine (PERIDEX) 0.12 % solution 15 mL  15 mL Mouth Rinse BID Dorothy Spark, MD   15 mL at 04/28/20 6387  . Chlorhexidine Gluconate Cloth 2 % PADS 6 each  6 each Topical Daily Dorothy Spark, MD   6 each at 04/14/2020 1103  . dextrose 50 % solution 0-50 mL  0-50 mL Intravenous PRN Dorothy Spark, MD      . enoxaparin (LOVENOX) injection 60 mg  60 mg Subcutaneous Q24H Dorothy Spark, MD   60 mg at 04/30/2020 1739  . fentaNYL (SUBLIMAZE) injection 50 mcg  50 mcg Intravenous Q2H PRN Dorothy Spark, MD   50 mcg at 05/01/2020 2155  . insulin aspart (novoLOG) injection 2-6 Units  2-6 Units Subcutaneous TID WC Dorothy Spark, MD   4 Units at 04/28/20 [phone]  . insulin aspart (novoLOG) injection 7 Units  7 Units Subcutaneous TID WC Dorothy Spark, MD   7 Units at 04/28/20 0809  . insulin glargine (LANTUS) injection 30 Units  30 Units Subcutaneous Daily Dorothy Spark, MD   30 Units at 04/28/20 1117  . lactated ringers infusion   Intravenous Continuous Chotiner, Yevonne Aline, MD 125 mL/hr  at 04/28/20 0253 New Bag at 04/28/20 0253  . lip balm (CARMEX) ointment   Topical PRN Dorothy Spark, MD      . MEDLINE mouth rinse  15 mL Mouth Rinse q12n4p Dorothy Spark, MD   15 mL at 05/03/2020 1801  . nafcillin 2 g in sodium chloride 0.9 % 100 mL IVPB  2 g Intravenous Q4H Dorothy Spark, MD 200 mL/hr at 04/28/20 0811 2 g at 04/28/20 0811  . norepinephrine (LEVOPHED) 4mg  in 270mL premix infusion  0-40 mcg/min Intravenous Titrated Dorothy Spark, MD   Stopped at 04/24/20 [phone]  . ondansetron (ZOFRAN) tablet 4 mg  4 mg Oral Q6H PRN Dorothy Spark, MD       Or  . ondansetron [facility]) injection 4 mg  4 mg Intravenous Q6H PRN Dorothy Spark, MD      . sodium chloride flush (NS) 0.9 % injection 10-40 mL  10-40 mL Intracatheter Q12H Dorothy Spark, MD   10 mL at 04/26/20 2117  . sodium chloride flush (NS) 0.9 % injection 10-40 mL  10-40 mL Intracatheter PRN Dorothy Spark, MD        Medications Prior to Admission  Medication Sig Dispense Refill Last Dose  . HYDROcodone-acetaminophen (NORCO/VICODIN) 5-325 MG tablet Take 1 tablet by mouth every 6 (six) hours as needed for moderate pain.   04/21/2020 at 2030  . lidocaine (LIDODERM) 5 % Place 1 patch onto the skin daily. Remove & Discard patch within 12 hours or as directed by MD 30 patch 0 04/20/2020  . meloxicam (MOBIC) 7.5 MG tablet Take 7.5 mg by mouth daily.   04/21/2020 at Unknown time  . predniSONE (DELTASONE) 10 MG tablet Take as directed for 12 days.  Daily dose 6,6,5,5,4,4,3,3,2,2,1,1. (Patient taking differently: Take 10-60 mg by mouth See admin instructions. Take as directed for 12 days.  Daily dose 6,6,5,5,4,4,3,3,2,2,1,1.) 42 tablet 0 04/21/2020 at Unknown time  . traMADol (ULTRAM) 50 MG tablet Take 1-2 tablets (50-100 mg total) by mouth every 6 (six) hours as needed for moderate pain. 20 tablet 0 04/21/2020 at Unknown time    Family History  Problem Relation Age of Onset  . Diabetes Father   .  Hypertension Father   . Heart disease Father   .  Hyperlipidemia Father      Review of Systems:   ROS Pertinent items are noted in HPI.     Cardiac Review of Systems: Y or  [    ]= no  Chest Pain [n]  Resting SOB [  n ] Exertional SOB  [  ]  Orthopnea [  ]   Pedal Edema [   ]    Palpitations [  ] Syncope  [  ]   Presyncope [   ]  General Review of Systems: [Y] = yes [  ]=no Constitional: recent weight change [n  ]; anorexia [ y ]; fatigue [ y ]; nausea [ y ]; night sweats [ y ]; fever Blue.Reese  ]; or chills [ y ]                                                               Dental: Last Dentist visit:   Eye : blurred vision [ n ]; diplopia [n   ]; vision changes [n  ];  Amaurosis fugax[ n ]; Resp: cough [n  ];  wheezingn[  ];  hemoptysis[n  ]; shortness of breath[  ]; paroxysmal nocturnal dyspnea[  ]; dyspnea on exertion[  ]; or orthopnea[  ];  GI:  gallstones[  ], vomiting[y  ];  dysphagia[  ]; melena[  ];  hematochezia [  ]; heartburn[  ];   Hx of  Colonoscopy[  ]; GU: kidney stones [n  ]; hematuria[n  ];   dysuria [  ];  nocturia[  ];  history of     obstruction [  ]; urinary frequency [  ]             Skin: + rash, swelling[ n ];, hair loss[  ];  peripheral edema[ y];  or itching[  ]; Musculosketetal: myalgias[  ];  joint swelling[  ];  joint erythema[  ];  joint pain[  ];  back pain[  ];  Heme/Lymph: bruising[ y ];  bleeding[  ];  anemia[  ];  Neuro: TIA[  ];  headaches[ n ];  stroke[ n ];  vertigo[  ];  seizures[ n ];   paresthesias[  ];  difficulty walking[  ];  Psych:depression[  ]; anxiety[  y];  Endocrine: diabetes[ y ];  thyroid dysfunction[  ];         Physical Exam: BP 116/77 (BP Location: Left Arm)   Pulse (!) 110   Temp 99.7 F (37.6 C) (Oral)   Resp 20   Ht 6\' 3"  (1.905 m)   Wt 128.9 kg   SpO2 98%   BMI 35.52 kg/m    General appearance: alert, cooperative and mild distress Head: Normocephalic, without obvious abnormality, atraumatic Neck: no adenopathy, no  carotid bruit, no JVD, supple, symmetrical, trachea midline and thyroid not enlarged, symmetric, no tenderness/mass/nodules Lymph nodes: Cervical, supraclavicular, and axillary nodes normal. Resp: clear to auscultation bilaterally Cardio: tachycardiac; no murmurs GI: soft, non-tender; bowel sounds normal; no masses,  no organomegaly Extremities: base of left great toe with Janeway lesion Neurologic: Alert and oriented X 3, normal strength and tone. Normal symmetric reflexes. Normal coordination and gait  Diagnostic Studies & Laboratory data:     Recent Radiology Findings:   ECHO TEE  Result Date: 04/08/2020  TRANSESOPHOGEAL ECHO REPORT   Patient Name:   Jonathan Shaffer Date of Exam: 04/05/2020 Medical Rec #:  [id number]      Height:       75.0 in Accession #:    [id number]     Weight:       265.0 lb Date of Birth:  [birth year]     BSA:          2.473 m Patient Age:    7 years       BP:           146/114 mmHg Patient Gender: M              HR:           104 bpm. Exam Location:  Inpatient Procedure: Transesophageal Echo Indications:    Bacteremia 790.7 / R78.81  History:        Patient has prior history of Echocardiogram examinations, most                 recent 04/23/2020. Risk Factors:Diabetes.  Sonographer:    Bernadene Person RDCS Referring Phys: [id number] CADENCE H FURTH PROCEDURE: After discussion of the risks and benefits of a TEE, an informed consent was obtained from the patient. The transesophogeal probe was passed without difficulty through the esophogus of the patient. Local oropharyngeal anesthetic was provided with Cetacaine. Sedation performed by different physician. The patient was monitored while under deep sedation. Anesthestetic sedation was provided intravenously by Anesthesiology: 400.5mg  of Propofol, 100mg  of Lidocaine. The patient's vital signs; including heart rate, blood pressure, and oxygen saturation; remained stable throughout the procedure. The patient developed no complications  during the procedure. IMPRESSIONS  1. There is a large echodensity that is partially fixed (below the valve on the ventricular side and possibly representing an abscess) and partially hypermobile, multilobar ( on the left atrial side) consistent with a vegetation with high risk for embolization. This involves P2,3 and A3 leaflets. Vegetation size is 4.4 x 2.4 cm.  2. Left ventricular ejection fraction, by estimation, is 55 to 60%. The left ventricle has normal function. The left ventricle has no regional wall motion abnormalities. Left ventricular diastolic function could not be evaluated.  3. Right ventricular systolic function is normal. The right ventricular size is normal.  4. No left atrial/left atrial appendage thrombus was detected.  5. The mitral valve is normal in structure. Mild mitral valve regurgitation. No evidence of mitral stenosis.  6. The aortic valve is normal in structure. Aortic valve regurgitation is not visualized. No aortic stenosis is present.  7. The inferior vena cava is normal in size with greater than 50% respiratory variability, suggesting right atrial pressure of 3 mmHg. Conclusion(s)/Recommendation(s): Normal biventricular function without evidence of hemodynamically significant valvular heart disease. CT surgery and hospitalist Dr Avon Gully were notified at 1:10 pm.The patient will be transferred to the [facility].  FINDINGS  Left Ventricle: Left ventricular ejection fraction, by estimation, is 55 to 60%. The left ventricle has normal function. The left ventricle has no regional wall motion abnormalities. The left ventricular internal cavity size was normal in size. There is  no left ventricular hypertrophy. Left ventricular diastolic function could not be evaluated. Right Ventricle: The right ventricular size is normal. No increase in right ventricular wall thickness. Right ventricular systolic function is normal. Left Atrium: Left atrial size was normal in size. No left atrial/left atrial  appendage thrombus was detected. Right Atrium: Right atrial size was normal in size. Pericardium: There is no  evidence of pericardial effusion. Mitral Valve: There is a large echodensity that is partially fixed (below the valve on the ventricular side and possibly representing an abscess) and partially hypermobile, multilobar ( on the left atrial side) consistent with a vegetation with high risk  for embolization. This involves P2,3 and A3 leaflets. The mitral valve is normal in structure. Mild mitral valve regurgitation. No evidence of mitral valve stenosis. Tricuspid Valve: The tricuspid valve is normal in structure. Tricuspid valve regurgitation is mild . No evidence of tricuspid stenosis. There is no evidence of tricuspid valve vegetation. Aortic Valve: The aortic valve is normal in structure. Aortic valve regurgitation is not visualized. No aortic stenosis is present. There is no evidence of aortic valve vegetation. Pulmonic Valve: The pulmonic valve was normal in structure. Pulmonic valve regurgitation is not visualized. No evidence of pulmonic stenosis. There is no evidence of pulmonic valve vegetation. Aorta: The aortic root is normal in size and structure. There is minimal (Grade I) plaque. Venous: The inferior vena cava is normal in size with greater than 50% respiratory variability, suggesting right atrial pressure of 3 mmHg. IAS/Shunts: The interatrial septum appears to be lipomatous. No atrial level shunt detected by color flow Doppler. There is no evidence of an atrial septal defect. [name] MD Electronically signed by [name] MD Signature Date/Time: 04/14/2020/1:29:48 PM    Final      I have independently reviewed the above radiologic studies and discussed with the patient   Recent Lab Findings: Lab Results  Component Value Date   WBC 16.0 (H) 04/28/2020   HGB 10.2 (L) 04/28/2020   HCT 30.9 (L) 04/28/2020   PLT 436 (H) 04/28/2020   GLUCOSE 188 (H) 04/28/2020   ALT 20  04/28/2020   AST 24 04/28/2020   NA 134 (L) 04/28/2020   K 3.9 04/28/2020   CL 104 04/28/2020   CREATININE 1.38 (H) 04/28/2020   BUN 14 04/28/2020   CO2 19 (L) 04/28/2020   INR 1.3 (H) 04/23/2020   HGBA1C 11.3 (H) 04/13/2020      Assessment / Plan:      Mitral valve endocarditis. Unclear source; ideally would have dental exam--will order orthopanogram Needs LHC prior to surgery-I have spoken with cardiology The indications for urgent surgery are heart block and severe MR with heart failure. He has neither, but will likely need surgery this admit. I have explained the condition and the plan of care with the patient and family.     I  spent 30 minutes counseling the patient face to face.   Chayce Robbins Z. Orvan Seen, MD [phone] 04/28/2020 11:40 AM

## 2020-04-28 NOTE — Progress Notes (Signed)
PROGRESS NOTE    Jonathan Shaffer  DPO:[id number] DOB: 1965/09/13 DOA: 04/13/2020 PCP: Patient, No Pcp Per   Brief Narrative:  Jonathan Shaffer is a 54 y/o gentleman admitted with sepsis, found to have MSSA bacteremia. He developed sudden onset nausea and vomiting and chills last week and later developed fevers and R shoulder pain. He was evaluated and felt to have a sprain. He was treated with an OP steroid injection and started oral steroids. He developed a painful rash on his hand. His progressive malaise, fevers, rash, and joint pain prompted return to the ED. At presentation he was febrile, tachycardic, tachypneic, with lactic acidosis. He was in DKA at presentation. He has a previous history of "high diabetes markers", which he reports he was not following up on. He was started on empiric vanc, zosyn & cefepime, and doxycycline. He has been evaluated by orthopaedics for possible septic arthritis of his R AC joint. ID has been consulted for MSSA+ blood cultures. No known history of IVDU or injuries prior to becoming ill. 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  Continue to have fever of 100.9 and leukocytosis.  Lactic acid: WNL.  He is tachycardic and tachypneic. -TEE from 11/24 shows 5 cm vegetation. -Cardiothoracic surgery consulted and recommended cardiac cath first prior to surgical valve replacement/repair. -Appreciate ID assistance-continue cefazolin given sensitivities. -Repeat blood culture-follow -PICC line removed-on line holiday currently. -Ortho following irrigation and debridement of the right shoulder.  Mitral valve endocarditis and perivalvular abscess: -Source of  infection: Septic arthritis, deltoid abscess, clavicular osteomyelitis -TTE on 11/19 showed normal ejection fraction with no significant valvular dysfunction.  - Reviewed TEE from 11/24 -Cardiothoracic surgery consulted and recommended cardiac cath prior to the surgical valve replacement/repair.  Cardiology consulted. -Continue cefazolin 2 g every 4 hours -Follow repeat cultures.  Septic arthritis in right AC joint and OM distal clavicle and acromion and into the deltoid abscess and subacromial bursitis status post irrigation and drainage on 04/07/2020: -Ortho is following.  Appreciate input. -Patient has serous drainage from his right shoulder this morning -Management per Ortho.  Splenic infarcts: -As per ID note from 11/24.  ID discussed with general surgery-they do not do splenectomy in these cases abscess/infarct.  Need to call surgery again if no source outside of the spleen  Acute encephalopathy: Multifactorial -Acute infarcts versus hospital delirium versus underlying sepsis -Continue supportive care.  Patient's mentation is improving. -PT consulted  AKI: Improving.  Continue to hold nephrotoxic medication.  Monitor kidney function closely.  Hypokalemia: Resolved  Hyponatremia: Improving.  Uncontrolled new onset diabetes mellitus: Was in DKA before now resolved. -A1c: 11.3% -Continue sliding scale insulin.  Glargine 30 units, mealtime coverage to 7 units 3 times daily AC -Goal blood glucose 140-180 while critically ill. -Continue to monitor blood sugar closely  Acute anemia: -Suspect this is mixed secondary to critical illness versus dilutional. -H&H is stable.  Continue to monitor.  Transfuse if hemoglobin less than 7.  Hyperbilirubinemia likely in the setting of underlying sepsis. -LFTs: WNL.  Is improving from 2.3-1.7.  Wrist pain due to cellulitis: Continue antibiotics as above  Morbid obesity with BMI of 35 -Diet modification/exercise/weight loss  recommended.  DVT prophylaxis: Lovenox Code Status: Full code Family Communication: Patient's wife  present at bedside.  Plan of care discussed with patient in length and he verbalized understanding and agreed with it. Disposition Plan: To be determined  Consultants:   ID  Cardiology  Cardiothoracic surgery  Procedures:   TEE  TTE irrigation and drainage  MRI shoulder  MRI brain  MRI wrist  Antimicrobials:   Nafcillin  Status is: Inpatient   Dispo: The patient is from: Home              Anticipated d/c is to: Home              Anticipated d/c date is: > 3 days              Patient currently is not medically stable to d/c.   Subjective: Patient seen and examined.  Tells me that he is sore all over.  Has serous draining from his right shoulder.  He denies nausea, vomiting, abdominal pain, chest pain or shortness of breath.  Objective: Vitals:   04/28/20 0023 04/28/20 0200 04/28/20 0430 04/28/20 0803  BP:  125/73 (!) 135/92 116/77  Pulse:  (!) 105 98 (!) 110  Resp: (!) 30 (!) 30 (!) 24 20  Temp: (!) 100.7 F (38.2 C)  98.3 F (36.8 C) 99.2 F (37.3 C)  TempSrc: Oral  Oral Oral  SpO2:  97% 99% 98%  Weight:   128.9 kg   Height:        Intake/Output Summary (Last 24 hours) at 04/28/2020 0958 Last data filed at 04/28/2020 0933 Gross per 24 hour  Intake 3018.62 ml  Output 2575 ml  Net 443.62 ml   Filed Weights   04/28/2020 1239 04/11/2020 1159 04/28/20 0430  Weight: 120.2 kg 120.2 kg 128.9 kg    Examination:  General exam: Appears calm and comfortable, on room air, appears weak, sick,  Respiratory system: Clear to auscultation. Respiratory effort normal. Cardiovascular system: S1 & S2 heard, RRR. No JVD, murmurs, rubs, gallops or clicks. No pedal edema. Gastrointestinal system: Abdomen is nondistended, soft and nontender. No organomegaly or masses felt. Normal bowel sounds heard. Central nervous system: Alert and oriented. No focal neurological  deficits. Extremities: Right shoulder dressing serous drainage noted. Skin: No rashes, lesions or ulcers Psychiatry: Judgement and insight appear normal. Mood & affect appropriate.    Data Reviewed: I have personally reviewed following labs and imaging studies  CBC: Recent Labs  Lab 04/13/2020 0202 04/08/2020 0204 04/23/20 0537 04/24/20 0600 04/26/20 0635 04/05/2020 0228 04/28/20 0014  WBC 21.3*   < > 16.3* 14.8* 15.0* 16.3* 16.0*  NEUTROABS 19.5*  --   --   --   --   --   --   HGB 14.0   < > 11.0* 10.8* 10.5* 10.1* 10.2*  HCT 41.6   < > 32.8* 32.1* 32.1* 31.4* 30.9*  MCV 89.1   < > 89.4 89.4 91.7 91.3 89.8  PLT 191   < > 151 181 281 347 436*   < > = values in this interval not displayed.   Basic Metabolic Panel: Recent Labs  Lab 04/23/20 0537 04/23/20 1800 04/24/20 1743 04/19/2020 0500 04/26/20 0958 04/10/2020 0228 04/28/20 0014  NA 134*   < > 129* 131* 131* 131* 134*  K 2.8*   < > 3.7 3.1* 3.5 3.4* 3.9  CL 103   < > 98 100 101 102 104  CO2 22   < > 20* 22 20* 22 19*  GLUCOSE 173*   < > 320* 237* 218* 137*  188*  BUN 25*   < > 27* 26* 22* 17 14  CREATININE 1.34*   < > 1.40* 1.41* 1.25* 1.13 1.38*  CALCIUM 7.4*   < > 7.2* 7.1* 7.0* 7.0* 7.3*  MG 2.0  --   --   --   --  2.2  --    < > = values in this interval not displayed.   GFR: Estimated Creatinine Clearance: 88.5 mL/min (A) (by C-G formula based on SCr of 1.38 mg/dL (H)). Liver Function Tests: Recent Labs  Lab 05/03/2020 0202 04/23/20 0537 04/28/2020 0500 04/05/2020 0228 04/28/20 0014  AST 40 41 24 20 24   ALT 48* 32 36 21 20  ALKPHOS 133* 75 91 73 83  BILITOT 2.0* 1.5* 2.3* 2.1* 1.7*  PROT 6.4* 4.9* 5.2* 5.3* 5.5*  ALBUMIN 2.4* 1.7* 1.6* 1.6* 1.3*   Recent Labs  Lab 04/15/2020 0202  LIPASE 35   Recent Labs  Lab 04/15/2020 0202  AMMONIA 23   Coagulation Profile: Recent Labs  Lab 04/23/2020 0202  INR 1.3*   Cardiac Enzymes: Recent Labs  Lab 04/23/2020 0202  CKTOTAL 70   BNP (last 3 results) No results  for input(s): PROBNP in the last 8760 hours. HbA1C: No results for input(s): HGBA1C in the last 72 hours. CBG: Recent Labs  Lab 04/11/2020 0728 04/13/2020 1112 04/10/2020 1637 04/20/2020 2050 04/28/20 0611  GLUCAP 142* 113* 125* 162* 190*   Lipid Profile: No results for input(s): CHOL, HDL, LDLCALC, TRIG, CHOLHDL, LDLDIRECT in the last 72 hours. Thyroid Function Tests: No results for input(s): TSH, T4TOTAL, FREET4, T3FREE, THYROIDAB in the last 72 hours. Anemia Panel: No results for input(s): VITAMINB12, FOLATE, FERRITIN, TIBC, IRON, RETICCTPCT in the last 72 hours. Sepsis Labs: Recent Labs  Lab 04/23/20 1701 04/23/20 2015 04/25/2020 2201 04/28/20 0014  LATICACIDVEN 1.3 1.4 1.5 1.4    Recent Results (from the past 240 hour(s))  Blood Culture (routine x 2)     Status: Abnormal   Collection Time: 04/11/2020  2:00 AM   Specimen: BLOOD RIGHT HAND  Result Value Ref Range Status   Specimen Description   Final    BLOOD RIGHT HAND Performed at [facility], [address] [place], [place] [address]    Special Requests   Final    BOTTLES DRAWN AEROBIC AND ANAEROBIC Blood Culture adequate volume Performed at [facility], [address] [place], [place] [address]    Culture  Setup Time   Final    GRAM POSITIVE COCCI IN BOTH AEROBIC AND ANAEROBIC BOTTLES CRITICAL RESULT CALLED TO, READ BACK BY AND VERIFIED WITHSeward Meth [facility] 2109 04/14/2020 A BROWNING Performed at [facility], [place] [address] [place], [place] [address]    Culture STAPHYLOCOCCUS AUREUS (A)  Final   Report Status 04/24/2020 FINAL  Final   Organism ID, Bacteria STAPHYLOCOCCUS AUREUS  Final      Susceptibility   Staphylococcus aureus - MIC*    CIPROFLOXACIN <=0.5 SENSITIVE Sensitive     ERYTHROMYCIN <=0.25 SENSITIVE Sensitive     GENTAMICIN <=0.5 SENSITIVE Sensitive     OXACILLIN 0.5 SENSITIVE Sensitive     TETRACYCLINE <=1 SENSITIVE Sensitive     VANCOMYCIN 1 SENSITIVE Sensitive      TRIMETH/SULFA <=10 SENSITIVE Sensitive     CLINDAMYCIN <=0.25 SENSITIVE Sensitive     RIFAMPIN <=0.5 SENSITIVE Sensitive     Inducible Clindamycin NEGATIVE Sensitive     * STAPHYLOCOCCUS AUREUS  Blood Culture ID  Panel (Reflexed)     Status: Abnormal   Collection Time: 04/27/2020  2:00 AM  Result Value Ref Range Status   Enterococcus faecalis NOT DETECTED NOT DETECTED Final   Enterococcus Faecium NOT DETECTED NOT DETECTED Final   Listeria monocytogenes NOT DETECTED NOT DETECTED Final   Staphylococcus species DETECTED (A) NOT DETECTED Final    Comment: CRITICAL RESULT CALLED TO, READ BACK BY AND VERIFIED WITH: N GOLGOVAC PHARMD 2109 04/04/2020 A BROWNING    Staphylococcus aureus (BCID) DETECTED (A) NOT DETECTED Final    Comment: CRITICAL RESULT CALLED TO, READ BACK BY AND VERIFIED WITH: N GOLGOVAC PHARMD 2109 04/10/2020 A BROWNING    Staphylococcus epidermidis NOT DETECTED NOT DETECTED Final   Staphylococcus lugdunensis NOT DETECTED NOT DETECTED Final   Streptococcus species NOT DETECTED NOT DETECTED Final   Streptococcus agalactiae NOT DETECTED NOT DETECTED Final   Streptococcus pneumoniae NOT DETECTED NOT DETECTED Final   Streptococcus pyogenes NOT DETECTED NOT DETECTED Final   A.calcoaceticus-baumannii NOT DETECTED NOT DETECTED Final   Bacteroides fragilis NOT DETECTED NOT DETECTED Final   Enterobacterales NOT DETECTED NOT DETECTED Final   Enterobacter cloacae complex NOT DETECTED NOT DETECTED Final   Escherichia coli NOT DETECTED NOT DETECTED Final   Klebsiella aerogenes NOT DETECTED NOT DETECTED Final   Klebsiella oxytoca NOT DETECTED NOT DETECTED Final   Klebsiella pneumoniae NOT DETECTED NOT DETECTED Final   Proteus species NOT DETECTED NOT DETECTED Final   Salmonella species NOT DETECTED NOT DETECTED Final   Serratia marcescens NOT DETECTED NOT DETECTED Final   Haemophilus influenzae NOT DETECTED NOT DETECTED Final   Neisseria meningitidis NOT DETECTED NOT DETECTED Final    Pseudomonas aeruginosa NOT DETECTED NOT DETECTED Final   Stenotrophomonas maltophilia NOT DETECTED NOT DETECTED Final   Candida albicans NOT DETECTED NOT DETECTED Final   Candida auris NOT DETECTED NOT DETECTED Final   Candida glabrata NOT DETECTED NOT DETECTED Final   Candida krusei NOT DETECTED NOT DETECTED Final   Candida parapsilosis NOT DETECTED NOT DETECTED Final   Candida tropicalis NOT DETECTED NOT DETECTED Final   Cryptococcus neoformans/gattii NOT DETECTED NOT DETECTED Final   Meth resistant mecA/C and MREJ NOT DETECTED NOT DETECTED Final    Comment: Performed at [facility], [address], [place], [place] [address]  Urine culture     Status: Abnormal   Collection Time: 04/14/2020  2:02 AM   Specimen: In/Out Cath Urine  Result Value Ref Range Status   Specimen Description IN/OUT CATH URINE  Final   Special Requests NONE  Final   Culture 40,000 COLONIES/mL STAPHYLOCOCCUS AUREUS (A)  Final   Report Status 04/24/2020 FINAL  Final   Organism ID, Bacteria STAPHYLOCOCCUS AUREUS (A)  Final      Susceptibility   Staphylococcus aureus - MIC*    CIPROFLOXACIN <=0.5 SENSITIVE Sensitive     GENTAMICIN <=0.5 SENSITIVE Sensitive     NITROFURANTOIN 32 SENSITIVE Sensitive     OXACILLIN <=0.25 SENSITIVE Sensitive     TETRACYCLINE <=1 SENSITIVE Sensitive     VANCOMYCIN 1 SENSITIVE Sensitive     TRIMETH/SULFA <=10 SENSITIVE Sensitive     CLINDAMYCIN <=0.25 SENSITIVE Sensitive     RIFAMPIN <=0.5 SENSITIVE Sensitive     Inducible Clindamycin NEGATIVE Sensitive     * 40,000 COLONIES/mL STAPHYLOCOCCUS AUREUS  Resp Panel by RT-PCR (Flu A&B, Covid) Nasopharyngeal Swab     Status: None   Collection Time: 05/02/2020  2:05 AM   Specimen: Nasopharyngeal Swab; Nasopharyngeal(NP) swabs in vial transport  medium  Result Value Ref Range Status   SARS Coronavirus 2 by RT PCR NEGATIVE NEGATIVE Final    Comment: (NOTE) SARS-CoV-2 target nucleic acids are NOT DETECTED.  The SARS-CoV-2 RNA is  generally detectable in upper respiratory specimens during the acute phase of infection. The lowest concentration of SARS-CoV-2 viral copies this assay can detect is 138 copies/mL. A negative result does not preclude SARS-Cov-2 infection and should not be used as the sole basis for treatment or other patient management decisions. A negative result may occur with  improper specimen collection/handling, submission of specimen other than nasopharyngeal swab, presence of viral mutation(s) within the areas targeted by this assay, and inadequate number of viral copies(<138 copies/mL). A negative result must be combined with clinical observations, patient history, and epidemiological information. The expected result is Negative.  Fact Sheet for Patients:  EntrepreneurPulse.com.au  Fact Sheet for Healthcare Providers:  IncredibleEmployment.be  This test is no t yet approved or cleared by the [place] FDA and  has been authorized for detection and/or diagnosis of SARS-CoV-2 by FDA under an Emergency Use Authorization (EUA). This EUA will remain  in effect (meaning this test can be used) for the duration of the COVID-19 declaration under Section 564(b)(1) of the Act, 21 U.S.C.section 360bbb-3(b)(1), unless the authorization is terminated  or revoked sooner.       Influenza A by PCR NEGATIVE NEGATIVE Final   Influenza B by PCR NEGATIVE NEGATIVE Final    Comment: (NOTE) The Xpert Xpress SARS-CoV-2/FLU/RSV plus assay is intended as an aid in the diagnosis of influenza from Nasopharyngeal swab specimens and should not be used as a sole basis for treatment. Nasal washings and aspirates are unacceptable for Xpert Xpress SARS-CoV-2/FLU/RSV testing.  Fact Sheet for Patients: EntrepreneurPulse.com.au  Fact Sheet for Healthcare Providers: IncredibleEmployment.be  This test is not yet approved or cleared by the [place] FDA and has been authorized for detection and/or diagnosis of SARS-CoV-2 by FDA under an Emergency Use Authorization (EUA). This EUA will remain in effect (meaning this test can be used) for the duration of the COVID-19 declaration under Section 564(b)(1) of the Act, 21 U.S.C. section 360bbb-3(b)(1), unless the authorization is terminated or revoked.  Performed at [facility], [address] [place], [place] [address]   Blood Culture (routine x 2)     Status: Abnormal   Collection Time: 04/04/2020  2:06 AM   Specimen: BLOOD LEFT FOREARM  Result Value Ref Range Status   Specimen Description   Final    BLOOD LEFT FOREARM Performed at [facility], [address] [place], [place] [address]    Special Requests   Final    BOTTLES DRAWN AEROBIC AND ANAEROBIC Blood Culture adequate volume Performed at [facility], [address] [place], [place] [address]    Culture  Setup Time   Final    GRAM POSITIVE COCCI IN BOTH AEROBIC AND ANAEROBIC BOTTLES CRITICAL VALUE NOTED.  VALUE IS CONSISTENT WITH PREVIOUSLY REPORTED AND CALLED VALUE.    Culture (A)  Final    STAPHYLOCOCCUS AUREUS SUSCEPTIBILITIES PERFORMED ON PREVIOUS CULTURE WITHIN THE LAST 5 DAYS. Performed at [facility], [place] [address] [place], [place] [address]    Report Status 04/24/2020 FINAL  Final  MRSA PCR Screening     Status: None   Collection Time: 04/29/2020  6:24 AM   Specimen: Nasopharyngeal  Result Value Ref Range Status   MRSA by PCR NEGATIVE  NEGATIVE Final    Comment:        The GeneXpert MRSA Assay (FDA approved for NASAL specimens only), is one component of a comprehensive MRSA colonization surveillance program. It is not intended to diagnose MRSA infection nor to guide or monitor treatment for MRSA infections. Performed at [facility], [place] [address] [place], [place] [address]   Culture, blood (single)     Status: Abnormal   Collection  Time: 04/08/2020  1:53 PM   Specimen: BLOOD RIGHT HAND  Result Value Ref Range Status   Specimen Description   Final    BLOOD RIGHT HAND Performed at [facility] [address], [place], [place] [address]    Special Requests   Final    BOTTLES DRAWN AEROBIC AND ANAEROBIC Blood Culture results may not be optimal due to an inadequate volume of blood received in culture bottles Performed at [facility] [address] [place], [place] [address]    Culture  Setup Time   Final    ANAEROBIC BOTTLE ONLY GRAM POSITIVE COCCI CRITICAL RESULT CALLED TO, READ BACK BY AND VERIFIED WITH: Concepcion Elk [phone] MLM Performed at [facility], [address], [place], [place] [address]    Culture STAPHYLOCOCCUS AUREUS (A)  Final   Report Status 04/28/2020 FINAL  Final   Organism ID, Bacteria STAPHYLOCOCCUS AUREUS  Final      Susceptibility   Staphylococcus aureus - MIC*    CIPROFLOXACIN <=0.5 SENSITIVE Sensitive     ERYTHROMYCIN <=0.25 SENSITIVE Sensitive     GENTAMICIN <=0.5 SENSITIVE Sensitive     OXACILLIN 0.5 SENSITIVE Sensitive     TETRACYCLINE <=1 SENSITIVE Sensitive     VANCOMYCIN 1 SENSITIVE Sensitive     TRIMETH/SULFA <=10 SENSITIVE Sensitive     CLINDAMYCIN <=0.25 SENSITIVE Sensitive     RIFAMPIN <=0.5 SENSITIVE Sensitive     Inducible Clindamycin NEGATIVE Sensitive     * STAPHYLOCOCCUS AUREUS  Blood Culture ID Panel (Reflexed)     Status: Abnormal   Collection Time: 04/17/2020  1:53 PM  Result Value Ref Range Status   Enterococcus faecalis NOT DETECTED NOT DETECTED Final   Enterococcus Faecium NOT DETECTED NOT DETECTED Final   Listeria monocytogenes NOT DETECTED NOT DETECTED Final   Staphylococcus species DETECTED (A) NOT DETECTED Final    Comment: CRITICAL RESULT CALLED TO, READ BACK BY AND VERIFIED WITH: PHARMD M HICKS [id number] MLM    Staphylococcus aureus (BCID) DETECTED (A) NOT DETECTED Final    Comment: CRITICAL RESULT  CALLED TO, READ BACK BY AND VERIFIED WITH: PHARMD M HICKS [id number] [id number] MLM    Staphylococcus epidermidis NOT DETECTED NOT DETECTED Final   Staphylococcus lugdunensis NOT DETECTED NOT DETECTED Final   Streptococcus species NOT DETECTED NOT DETECTED Final   Streptococcus agalactiae NOT DETECTED NOT DETECTED Final   Streptococcus pneumoniae NOT DETECTED NOT DETECTED Final   Streptococcus pyogenes NOT DETECTED NOT DETECTED Final   A.calcoaceticus-baumannii NOT DETECTED NOT DETECTED Final   Bacteroides fragilis NOT DETECTED NOT DETECTED Final   Enterobacterales NOT DETECTED NOT DETECTED Final   Enterobacter cloacae complex NOT DETECTED NOT DETECTED Final   Escherichia coli NOT DETECTED NOT DETECTED Final   Klebsiella aerogenes NOT DETECTED NOT DETECTED Final   Klebsiella oxytoca NOT DETECTED NOT DETECTED Final   Klebsiella pneumoniae NOT DETECTED NOT DETECTED Final   Proteus species NOT DETECTED NOT DETECTED Final   Salmonella species NOT DETECTED NOT DETECTED Final  Serratia marcescens NOT DETECTED NOT DETECTED Final   Haemophilus influenzae NOT DETECTED NOT DETECTED Final   Neisseria meningitidis NOT DETECTED NOT DETECTED Final   Pseudomonas aeruginosa NOT DETECTED NOT DETECTED Final   Stenotrophomonas maltophilia NOT DETECTED NOT DETECTED Final   Candida albicans NOT DETECTED NOT DETECTED Final   Candida auris NOT DETECTED NOT DETECTED Final   Candida glabrata NOT DETECTED NOT DETECTED Final   Candida krusei NOT DETECTED NOT DETECTED Final   Candida parapsilosis NOT DETECTED NOT DETECTED Final   Candida tropicalis NOT DETECTED NOT DETECTED Final   Cryptococcus neoformans/gattii NOT DETECTED NOT DETECTED Final   Meth resistant mecA/C and MREJ NOT DETECTED NOT DETECTED Final    Comment: Performed at [facility], [place] [address], [place], [place] [address]  Culture, blood (Routine X 2) w Reflex to ID Panel     Status: None (Preliminary result)   Collection Time: 04/24/20  7:52 AM     Specimen: BLOOD LEFT HAND  Result Value Ref Range Status   Specimen Description   Final    BLOOD LEFT HAND Performed at [facility], [place] [address], [place], [place] [address]    Special Requests   Final    BOTTLES DRAWN AEROBIC AND ANAEROBIC BLOOD LEFT HAND Performed at [facility] [address] [place], [place] [address]    Culture   Final    NO GROWTH 4 DAYS Performed at [facility], [place] [address], [place], [place] [address]    Report Status PENDING  Incomplete  Culture, blood (Routine X 2) w Reflex to ID Panel     Status: None (Preliminary result)   Collection Time: 04/24/20  7:52 AM   Specimen: BLOOD LEFT HAND  Result Value Ref Range Status   Specimen Description   Final    BLOOD LEFT HAND Performed at [facility], [place] [address], [place], [place] [address]    Special Requests   Final    BOTTLES DRAWN AEROBIC AND ANAEROBIC BLOOD LEFT HAND Performed at [facility] [address] [place], [place] [address]    Culture   Final    NO GROWTH 4 DAYS Performed at [facility], [place] [address], [place], [place] [address]    Report Status PENDING  Incomplete  Aerobic/Anaerobic Culture (surgical/deep wound)     Status: None (Preliminary result)   Collection Time: 05/03/2020  2:48 PM   Specimen: PATH Other; Tissue  Result Value Ref Range Status   Specimen Description   Final    ABSCESS SHOULDER RIGHT Performed at [facility] [address] [place], [place] [address]    Special Requests   Final    NONE Performed at [facility], [place] [address] [place], [place] [address]    Gram Stain   Final    FEW WBC PRESENT, PREDOMINANTLY PMN ABUNDANT GRAM POSITIVE COCCI Performed at [facility], [place] [address], [place],  [address]    Culture MODERATE STAPHYLOCOCCUS AUREUS  Final   Report Status PENDING  Incomplete   Organism ID, Bacteria STAPHYLOCOCCUS AUREUS   Final      Susceptibility   Staphylococcus aureus - MIC*    CIPROFLOXACIN <=0.5 SENSITIVE Sensitive     ERYTHROMYCIN <=0.25 SENSITIVE Sensitive     GENTAMICIN <=0.5 SENSITIVE Sensitive     OXACILLIN 0.5 SENSITIVE Sensitive     TETRACYCLINE <=1 SENSITIVE Sensitive     VANCOMYCIN  1 SENSITIVE Sensitive     TRIMETH/SULFA <=10 SENSITIVE Sensitive     CLINDAMYCIN <=0.25 SENSITIVE Sensitive     RIFAMPIN <=0.5 SENSITIVE Sensitive     Inducible Clindamycin NEGATIVE Sensitive     * MODERATE STAPHYLOCOCCUS AUREUS  Aerobic/Anaerobic Culture (surgical/deep wound)     Status: None (Preliminary result)   Collection Time: 04/18/2020  2:58 PM   Specimen: PATH Other; Tissue  Result Value Ref Range Status   Specimen Description   Final    TISSUE SHOULDER RIGHT Performed at [facility] [address] [place], [place] [address]    Special Requests   Final    NONE Performed at [facility], [place] [address] [place], [place] [address]    Gram Stain   Final    ABUNDANT WBC PRESENT, PREDOMINANTLY PMN ABUNDANT GRAM POSITIVE COCCI Performed at [facility], [place] [address], [place], [place] [address]    Culture ABUNDANT STAPHYLOCOCCUS AUREUS  Final   Report Status PENDING  Incomplete   Organism ID, Bacteria STAPHYLOCOCCUS AUREUS  Final      Susceptibility   Staphylococcus aureus - MIC*    CIPROFLOXACIN <=0.5 SENSITIVE Sensitive     ERYTHROMYCIN <=0.25 SENSITIVE Sensitive     GENTAMICIN <=0.5 SENSITIVE Sensitive     OXACILLIN 0.5 SENSITIVE Sensitive     TETRACYCLINE <=1 SENSITIVE Sensitive     VANCOMYCIN <=0.5 SENSITIVE Sensitive     TRIMETH/SULFA <=10 SENSITIVE Sensitive     CLINDAMYCIN <=0.25 SENSITIVE Sensitive     RIFAMPIN <=0.5 SENSITIVE Sensitive     Inducible Clindamycin NEGATIVE Sensitive     * ABUNDANT STAPHYLOCOCCUS AUREUS  Culture, blood (Routine X 2) w Reflex to ID Panel     Status: None (Preliminary result)   Collection Time: 04/26/20  9:58  AM   Specimen: BLOOD RIGHT HAND  Result Value Ref Range Status   Specimen Description   Final    BLOOD RIGHT HAND Performed at [place] [address] [place], [place] [address]    Special Requests   Final    BOTTLES DRAWN AEROBIC ONLY Blood Culture results may not be optimal due to an inadequate volume of blood received in culture bottles Performed at [place] [address] [place], [place] [address]    Culture   Final    NO GROWTH 2 DAYS Performed at [facility] [address] [place], [place] [address]    Report Status PENDING  Incomplete  Culture, blood (Routine X 2) w Reflex to ID Panel     Status: None (Preliminary result)   Collection Time: 04/26/20  9:58 AM   Specimen: BLOOD RIGHT HAND  Result Value Ref Range Status   Specimen Description   Final    BLOOD RIGHT HAND Performed at [facility] [address], [place], [place] [address]    Special Requests   Final    BOTTLES DRAWN AEROBIC ONLY Blood Culture results may not be optimal due to an inadequate volume of blood received in culture bottles Performed at [facility] [address], [place], [place] [address]    Culture   Final    NO GROWTH 2 DAYS Performed at [facility] [address], [place], [place] [address]    Report Status PENDING  Incomplete      Radiology Studies: ECHO TEE  Result Date: 04/06/2020    TRANSESOPHOGEAL ECHO REPORT   Patient Name:  Jethro Bastos Date of Exam: 04/10/2020 Medical Rec #:  [id number]      Height:       75.0 in Accession #:    [id number]     Weight:       265.0 lb Date of Birth:  04/14/1966     BSA:          2.473 m Patient Age:    32 years       BP:           146/114 mmHg Patient Gender: M              HR:           104 bpm. Exam Location:  Inpatient Procedure: Transesophageal Echo Indications:    Bacteremia 790.7 / R78.81  History:        Patient has prior history of  Echocardiogram examinations, most                 recent 04/13/2020. Risk Factors:Diabetes.  Sonographer:    Bernadene Person RDCS Referring Phys: [id number] CADENCE H FURTH PROCEDURE: After discussion of the risks and benefits of a TEE, an informed consent was obtained from the patient. The transesophogeal probe was passed without difficulty through the esophogus of the patient. Local oropharyngeal anesthetic was provided with Cetacaine. Sedation performed by different physician. The patient was monitored while under deep sedation. Anesthestetic sedation was provided intravenously by Anesthesiology: 400.5mg  of Propofol, 100mg  of Lidocaine. The patient's vital signs; including heart rate, blood pressure, and oxygen saturation; remained stable throughout the procedure. The patient developed no complications during the procedure. IMPRESSIONS  1. There is a large echodensity that is partially fixed (below the valve on the ventricular side and possibly representing an abscess) and partially hypermobile, multilobar ( on the left atrial side) consistent with a vegetation with high risk for embolization. This involves P2,3 and A3 leaflets. Vegetation size is 4.4 x 2.4 cm.  2. Left ventricular ejection fraction, by estimation, is 55 to 60%. The left ventricle has normal function. The left ventricle has no regional wall motion abnormalities. Left ventricular diastolic function could not be evaluated.  3. Right ventricular systolic function is normal. The right ventricular size is normal.  4. No left atrial/left atrial appendage thrombus was detected.  5. The mitral valve is normal in structure. Mild mitral valve regurgitation. No evidence of mitral stenosis.  6. The aortic valve is normal in structure. Aortic valve regurgitation is not visualized. No aortic stenosis is present.  7. The inferior vena cava is normal in size with greater than 50% respiratory variability, suggesting right atrial pressure of 3 mmHg.  Conclusion(s)/Recommendation(s): Normal biventricular function without evidence of hemodynamically significant valvular heart disease. CT surgery and hospitalist Dr Avon Gully were notified at 1:10 pm.The patient will be transferred to the [facility].  FINDINGS  Left Ventricle: Left ventricular ejection fraction, by estimation, is 55 to 60%. The left ventricle has normal function. The left ventricle has no regional wall motion abnormalities. The left ventricular internal cavity size was normal in size. There is  no left ventricular hypertrophy. Left ventricular diastolic function could not be evaluated. Right Ventricle: The right ventricular size is normal. No increase in right ventricular wall thickness. Right ventricular systolic function is normal. Left Atrium: Left atrial size was normal in size. No left atrial/left atrial appendage thrombus was detected. Right Atrium: Right atrial size was normal in size. Pericardium: There is no evidence of pericardial effusion. Mitral Valve: There is a  large echodensity that is partially fixed (below the valve on the ventricular side and possibly representing an abscess) and partially hypermobile, multilobar ( on the left atrial side) consistent with a vegetation with high risk  for embolization. This involves P2,3 and A3 leaflets. The mitral valve is normal in structure. Mild mitral valve regurgitation. No evidence of mitral valve stenosis. Tricuspid Valve: The tricuspid valve is normal in structure. Tricuspid valve regurgitation is mild . No evidence of tricuspid stenosis. There is no evidence of tricuspid valve vegetation. Aortic Valve: The aortic valve is normal in structure. Aortic valve regurgitation is not visualized. No aortic stenosis is present. There is no evidence of aortic valve vegetation. Pulmonic Valve: The pulmonic valve was normal in structure. Pulmonic valve regurgitation is not visualized. No evidence of pulmonic stenosis. There is no evidence of pulmonic valve  vegetation. Aorta: The aortic root is normal in size and structure. There is minimal (Grade I) plaque. Venous: The inferior vena cava is normal in size with greater than 50% respiratory variability, suggesting right atrial pressure of 3 mmHg. IAS/Shunts: The interatrial septum appears to be lipomatous. No atrial level shunt detected by color flow Doppler. There is no evidence of an atrial septal defect. [name] MD Electronically signed by [name] MD Signature Date/Time: 04/29/2020/1:29:48 PM    Final     Scheduled Meds: . chlorhexidine  15 mL Mouth Rinse BID  . Chlorhexidine Gluconate Cloth  6 each Topical Daily  . enoxaparin (LOVENOX) injection  60 mg Subcutaneous Q24H  . insulin aspart  2-6 Units Subcutaneous TID WC  . insulin aspart  7 Units Subcutaneous TID WC  . insulin glargine  30 Units Subcutaneous Daily  . mouth rinse  15 mL Mouth Rinse q12n4p  . sodium chloride flush  10-40 mL Intracatheter Q12H   Continuous Infusions: . lactated ringers 125 mL/hr at 04/28/20 0253  . nafcillin IV 2 g (04/28/20 0811)  . norepinephrine (LEVOPHED) Adult infusion Stopped (04/24/20 0439)     LOS: 6 days   Time spent: 45 minutes   Layonna Dobie Loann Quill, MD [facility]  If 7PM-7AM, please contact night-coverage [email or web address] 04/28/2020, 9:58 AM

## 2020-04-28 NOTE — Evaluation (Addendum)
Occupational Therapy Evaluation Patient Details Name: Jonathan Shaffer MRN: [id number] DOB: February 13, 1966 Today's Date: 04/28/2020    History of Present Illness Jonathan Shaffer is a 54 y.o. 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 Underwent TEE on 04/17/2020 resulting in mitral valve vegetation and perivalvular abscess. Plan for MVR. New onset DM2, hx of testicular cancer, thyroid disease.    Clinical Impression   Pt was independent prior to admission. Pt presents with generalized weakness, R shoulder pain at end range, L UE edema and generalized discomfort. Pt currently requires max assist for bed mobility and set up to total assist for ADL at bed level. Recommending CIR when pt is medically ready for d/c, depending on progress.  Pt has a first floor bedroom and bathroom he may stay in upon discharge and has the assistance of a supportive wife. Will follow acutely.    Follow Up Recommendations  CIR    Equipment Recommendations  3 in 1 bedside commode;Tub/shower bench    Recommendations for Other Services       Precautions / Restrictions Precautions Precautions: Fall Restrictions Other Position/Activity Restrictions: per  Dr. Erlinda Hong op note, no limits for ROM/activity      Mobility Bed Mobility Overal bed mobility: Needs Assistance Bed Mobility: Supine to Sit;Sit to Supine     Supine to sit: Max assist Sit to supine: Max assist   General bed mobility comments: assist for all aspects, moving legs well in bed and to EOB    Transfers                 General transfer comment: NT    Balance Overall balance assessment: Needs assistance   Sitting balance-Leahy Scale: Fair                                      ADL either performed or assessed with clinical judgement   ADL Overall ADL's : Needs assistance/impaired Eating/Feeding: Set up;Bed level   Grooming: Minimal assistance;Bed level;Sitting   Upper Body Bathing: Moderate assistance;Sitting   Lower Body Bathing: Total assistance;Bed level   Upper Body Dressing : Moderate assistance;Sitting   Lower Body Dressing: Total assistance;Bed level                 General ADL Comments: session limited to bed/EOb     Vision Baseline Vision/History: No visual deficits       Perception     Praxis      Pertinent Vitals/Pain Pain Assessment: Faces Faces Pain Scale: Hurts little more Pain Location: R shoulder, generalized  Pain Descriptors / Indicators: Grimacing;Guarding;Discomfort Pain Intervention(s): Monitored during session;Repositioned     Hand Dominance Right   Extremity/Trunk Assessment Upper Extremity Assessment Upper Extremity Assessment: RUE deficits/detail;LUE deficits/detail RUE Deficits / Details: in supine, able to FF full ROM with pain at end range, LUE Deficits / Details: edematous with full ROM   Lower Extremity Assessment Lower Extremity Assessment: Defer to PT evaluation   Cervical / Trunk Assessment Cervical / Trunk Assessment: Other exceptions (obesity)   Communication Communication Communication: No difficulties   Cognition Arousal/Alertness: Awake/alert Behavior During Therapy: WFL for tasks assessed/performed Overall Cognitive Status: Within Functional Limits for tasks assessed  General Comments: mildly lethargic   General Comments       Exercises     Shoulder Instructions      Home Living Family/patient expects to be discharged to:: Private residence Living Arrangements: Spouse/significant other Available Help at Discharge: Family;Available 24 hours/day Type of Home: House Home Access: Stairs to enter     Home Layout:  Two level;Able to live on main level with bedroom/bathroom Alternate Level Stairs-Number of Steps: flight   Bathroom Shower/Tub: Walk-in shower;Tub/shower unit (tub on first floor)   Bathroom Toilet: Standard     Home Equipment: Shower seat          Prior Functioning/Environment Level of Independence: Independent        Comments: golfs        OT Problem List: Decreased strength;Decreased activity tolerance;Impaired balance (sitting and/or standing);Decreased knowledge of use of DME or AE;Pain      OT Treatment/Interventions: Self-care/ADL training;DME and/or AE instruction;Patient/family education;Balance training;Therapeutic activities    OT Goals(Current goals can be found in the care plan section) Acute Rehab OT Goals Patient Stated Goal: to return to PLOF OT Goal Formulation: With patient Time For Goal Achievement: 05/12/20 Potential to Achieve Goals: Good  OT Frequency: Min 2X/week   Barriers to D/C:            Co-evaluation              AM-PAC OT "6 Clicks" Daily Activity     Outcome Measure Help from another person eating meals?: A Little Help from another person taking care of personal grooming?: A Little Help from another person toileting, which includes using toliet, bedpan, or urinal?: Total Help from another person bathing (including washing, rinsing, drying)?: A Lot Help from another person to put on and taking off regular upper body clothing?: A Lot Help from another person to put on and taking off regular lower body clothing?: Total 6 Click Score: 12   End of Session    Activity Tolerance: Patient limited by fatigue Patient left: in bed;with call bell/phone within reach;with bed alarm set;with family/visitor present  OT Visit Diagnosis: Pain;Muscle weakness (generalized) (M62.81)                Time: 1355-1415 OT Time Calculation (min): 20 min Charges:  OT General Charges $OT Visit: 1 Visit OT Evaluation $OT Eval Moderate Complexity:  1 Mod  Nestor Lewandowsky, OTR/L Acute Rehabilitation Services Pager: [phone] Office: [phone]  Malka So 04/28/2020, 2:41 PM

## 2020-04-28 NOTE — Progress Notes (Addendum)
[facility] for Infectious Disease  Date of Admission:  04/28/2020           Current antibiotics: Nafcillin  Reason for visit: Follow up on MSSA bacteremia   ASSESSMENT AND PLAN:   Jonathan Shaffer is a 54 y.o. male with metastatic MSSA infection transferred from [facility] to [facility] for CT surgery evaluation after TEE found 4.4cm MV vegetation and perivalvular abscess.   # MSSA bacteremia # MV endocarditis and perivalvular abscess # Septic emboli to CNS # Septic arthritis right AC joint and OM distal clavicle and acromion and intradeltoid abscess and subacromial bursitis s/p  I&D 04/09/2020 # Splenic infarct  -- continue nafcillin 2gm q4h -- Cardiology and CT surgery eval pending re: next steps -- Orthopedics following re: right shoulder -- follow repeat cultures -- will follow -- closely monitor his back pain.  Low threshold to obtain MRI  Principal Problem:   Abscess of right shoulder Active Problems:   Severe sepsis (HCC)   Splenic infarct   Diabetic acidosis without coma (HCC)   AKI (acute kidney injury) ([place])   Endocarditis of mitral valve   MSSA bacteremia   SUBJECTIVE:   Febrile, Tmax 100.9 11/21 blood cx negative Shoulder abscess cx = Staph aureus TEE yesterday and transferred to [facility] thoracic surgery eval.  Pt is getting anxious about next steps for this infection re: any surgery he may need.  Reports mid back spasm with movement but also states he has history of OA in his back.  Currently afebrile.  Complains of muscle aches and edema.  No chest pain or SOB.    Review of Systems: As noted above.  All other systems reviewed and are negative.   OBJECTIVE:   No Known Allergies  Blood pressure 116/77, pulse (!) 110, temperature 99.2 F (37.3 C), temperature source Oral, resp. rate 20, height 6\' 3"  (1.905 m), weight 128.9 kg, SpO2 98 %. Body mass index is 35.52 kg/m.  Physical Exam Constitutional:      General: He is  not in acute distress.    Appearance: Normal appearance.  HENT:     Head: Normocephalic and atraumatic.  Eyes:     Extraocular Movements: Extraocular movements intact.     Conjunctiva/sclera: Conjunctivae normal.  Cardiovascular:     Rate and Rhythm: Normal rate and regular rhythm.     Heart sounds: Murmur heard.   Pulmonary:     Effort: Pulmonary effort is normal. No respiratory distress.     Breath sounds: Normal breath sounds.  Abdominal:     General: Abdomen is flat.     Palpations: Abdomen is soft.     Tenderness: There is no abdominal tenderness.  Musculoskeletal:     Right lower leg: Edema present.     Left lower leg: Edema present.  Skin:    General: Skin is warm and dry.  Neurological:     General: No focal deficit present.     Mental Status: He is alert and oriented to person, place, and time.  Psychiatric:        Mood and Affect: Mood normal.        Behavior: Behavior normal.               Lab Results & Microbiology Lab Results  Component Value Date   WBC 16.0 (H) 04/28/2020   HGB 10.2 (L) 04/28/2020   HCT 30.9 (L) 04/28/2020   MCV 89.8 04/28/2020   PLT 436 (  H) 04/28/2020    Lab Results  Component Value Date   NA 134 (L) 04/28/2020   K 3.9 04/28/2020   CO2 19 (L) 04/28/2020   GLUCOSE 188 (H) 04/28/2020   BUN 14 04/28/2020   CREATININE 1.38 (H) 04/28/2020   CALCIUM 7.3 (L) 04/28/2020   GFRNONAA >60 04/28/2020    Lab Results  Component Value Date   ALT 20 04/28/2020   AST 24 04/28/2020   ALKPHOS 83 04/28/2020   BILITOT 1.7 (H) 04/28/2020     I have reviewed the micro and lab results in Epic.  Imaging ECHO TEE  Result Date: 04/08/2020    TRANSESOPHOGEAL ECHO REPORT   Patient Name:   Jonathan Shaffer Date of Exam: 04/28/2020 Medical Rec #:  [id number]      Height:       75.0 in Accession #:    [id number]     Weight:       265.0 lb Date of Birth:  07/04/1965     BSA:          2.473 m Patient Age:    36 years       BP:           146/114  mmHg Patient Gender: M              HR:           104 bpm. Exam Location:  Inpatient Procedure: Transesophageal Echo Indications:    Bacteremia 790.7 / R78.81  History:        Patient has prior history of Echocardiogram examinations, most                 recent 04/10/2020. Risk Factors:Diabetes.  Sonographer:    Bernadene Person RDCS Referring Phys: [id number] CADENCE H FURTH PROCEDURE: After discussion of the risks and benefits of a TEE, an informed consent was obtained from the patient. The transesophogeal probe was passed without difficulty through the esophogus of the patient. Local oropharyngeal anesthetic was provided with Cetacaine. Sedation performed by different physician. The patient was monitored while under deep sedation. Anesthestetic sedation was provided intravenously by Anesthesiology: 400.5mg  of Propofol, 100mg  of Lidocaine. The patient's vital signs; including heart rate, blood pressure, and oxygen saturation; remained stable throughout the procedure. The patient developed no complications during the procedure. IMPRESSIONS  1. There is a large echodensity that is partially fixed (below the valve on the ventricular side and possibly representing an abscess) and partially hypermobile, multilobar ( on the left atrial side) consistent with a vegetation with high risk for embolization. This involves P2,3 and A3 leaflets. Vegetation size is 4.4 x 2.4 cm.  2. Left ventricular ejection fraction, by estimation, is 55 to 60%. The left ventricle has normal function. The left ventricle has no regional wall motion abnormalities. Left ventricular diastolic function could not be evaluated.  3. Right ventricular systolic function is normal. The right ventricular size is normal.  4. No left atrial/left atrial appendage thrombus was detected.  5. The mitral valve is normal in structure. Mild mitral valve regurgitation. No evidence of mitral stenosis.  6. The aortic valve is normal in structure. Aortic valve regurgitation  is not visualized. No aortic stenosis is present.  7. The inferior vena cava is normal in size with greater than 50% respiratory variability, suggesting right atrial pressure of 3 mmHg. Conclusion(s)/Recommendation(s): Normal biventricular function without evidence of hemodynamically significant valvular heart disease. CT surgery and hospitalist Dr Avon Gully were notified at 1:10 pm.The patient will  be transferred to the [facility].  FINDINGS  Left Ventricle: Left ventricular ejection fraction, by estimation, is 55 to 60%. The left ventricle has normal function. The left ventricle has no regional wall motion abnormalities. The left ventricular internal cavity size was normal in size. There is  no left ventricular hypertrophy. Left ventricular diastolic function could not be evaluated. Right Ventricle: The right ventricular size is normal. No increase in right ventricular wall thickness. Right ventricular systolic function is normal. Left Atrium: Left atrial size was normal in size. No left atrial/left atrial appendage thrombus was detected. Right Atrium: Right atrial size was normal in size. Pericardium: There is no evidence of pericardial effusion. Mitral Valve: There is a large echodensity that is partially fixed (below the valve on the ventricular side and possibly representing an abscess) and partially hypermobile, multilobar ( on the left atrial side) consistent with a vegetation with high risk  for embolization. This involves P2,3 and A3 leaflets. The mitral valve is normal in structure. Mild mitral valve regurgitation. No evidence of mitral valve stenosis. Tricuspid Valve: The tricuspid valve is normal in structure. Tricuspid valve regurgitation is mild . No evidence of tricuspid stenosis. There is no evidence of tricuspid valve vegetation. Aortic Valve: The aortic valve is normal in structure. Aortic valve regurgitation is not visualized. No aortic stenosis is present. There is no evidence of aortic valve  vegetation. Pulmonic Valve: The pulmonic valve was normal in structure. Pulmonic valve regurgitation is not visualized. No evidence of pulmonic stenosis. There is no evidence of pulmonic valve vegetation. Aorta: The aortic root is normal in size and structure. There is minimal (Grade I) plaque. Venous: The inferior vena cava is normal in size with greater than 50% respiratory variability, suggesting right atrial pressure of 3 mmHg. IAS/Shunts: The interatrial septum appears to be lipomatous. No atrial level shunt detected by color flow Doppler. There is no evidence of an atrial septal defect. [name] MD Electronically signed by [name] MD Signature Date/Time: 04/14/2020/1:29:48 PM    Final       [facility] for Infectious Disease [facility] [phone] pager 04/28/2020, 9:16 AM

## 2020-04-29 ENCOUNTER — Inpatient Hospital Stay (HOSPITAL_COMMUNITY): Payer: BC Managed Care – PPO

## 2020-04-29 ENCOUNTER — Encounter (HOSPITAL_COMMUNITY): Admission: EM | Disposition: E | Payer: Self-pay | Source: Home / Self Care | Attending: Cardiothoracic Surgery

## 2020-04-29 DIAGNOSIS — I059 Rheumatic mitral valve disease, unspecified: Secondary | ICD-10-CM

## 2020-04-29 DIAGNOSIS — L02413 Cutaneous abscess of right upper limb: Secondary | ICD-10-CM | POA: Diagnosis not present

## 2020-04-29 HISTORY — PX: RIGHT/LEFT HEART CATH AND CORONARY ANGIOGRAPHY: CATH118266

## 2020-04-29 LAB — POCT I-STAT EG7
Acid-Base Excess: 2 mmol/L (ref 0.0–2.0)
Acid-Base Excess: 3 mmol/L — ABNORMAL HIGH (ref 0.0–2.0)
Bicarbonate: 26.9 mmol/L (ref 20.0–28.0)
Bicarbonate: 27.6 mmol/L (ref 20.0–28.0)
Calcium, Ion: 1.13 mmol/L — ABNORMAL LOW (ref 1.15–1.40)
Calcium, Ion: 1.13 mmol/L — ABNORMAL LOW (ref 1.15–1.40)
HCT: 31 % — ABNORMAL LOW (ref 39.0–52.0)
HCT: 32 % — ABNORMAL LOW (ref 39.0–52.0)
Hemoglobin: 10.5 g/dL — ABNORMAL LOW (ref 13.0–17.0)
Hemoglobin: 10.9 g/dL — ABNORMAL LOW (ref 13.0–17.0)
O2 Saturation: 67 %
O2 Saturation: 68 %
Potassium: 3.6 mmol/L (ref 3.5–5.1)
Potassium: 3.6 mmol/L (ref 3.5–5.1)
Sodium: 138 mmol/L (ref 135–145)
Sodium: 138 mmol/L (ref 135–145)
TCO2: 28 mmol/L (ref 22–32)
TCO2: 29 mmol/L (ref 22–32)
pCO2, Ven: 40.8 mmHg — ABNORMAL LOW (ref 44.0–60.0)
pCO2, Ven: 41.4 mmHg — ABNORMAL LOW (ref 44.0–60.0)
pH, Ven: 7.427 (ref 7.250–7.430)
pH, Ven: 7.432 — ABNORMAL HIGH (ref 7.250–7.430)
pO2, Ven: 34 mmHg (ref 32.0–45.0)
pO2, Ven: 34 mmHg (ref 32.0–45.0)

## 2020-04-29 LAB — CBC
HCT: 30.8 % — ABNORMAL LOW (ref 39.0–52.0)
HCT: 29.4 % — ABNORMAL LOW (ref 39.0–52.0)
Hemoglobin: 10.2 g/dL — ABNORMAL LOW (ref 13.0–17.0)
Hemoglobin: 9.5 g/dL — ABNORMAL LOW (ref 13.0–17.0)
MCH: 29.9 pg (ref 26.0–34.0)
MCH: 29.4 pg (ref 26.0–34.0)
MCHC: 33.1 g/dL (ref 30.0–36.0)
MCHC: 32.3 g/dL (ref 30.0–36.0)
MCV: 90.3 fL (ref 80.0–100.0)
MCV: 91 fL (ref 80.0–100.0)
Platelets: 530 10*3/uL — ABNORMAL HIGH (ref 150–400)
Platelets: 542 10*3/uL — ABNORMAL HIGH (ref 150–400)
RBC: 3.41 MIL/uL — ABNORMAL LOW (ref 4.22–5.81)
RBC: 3.23 MIL/uL — ABNORMAL LOW (ref 4.22–5.81)
RDW: 15.6 % — ABNORMAL HIGH (ref 11.5–15.5)
RDW: 15.8 % — ABNORMAL HIGH (ref 11.5–15.5)
WBC: 17.6 10*3/uL — ABNORMAL HIGH (ref 4.0–10.5)
WBC: 20.6 10*3/uL — ABNORMAL HIGH (ref 4.0–10.5)
nRBC: 0 % (ref 0.0–0.2)
nRBC: 0 % (ref 0.0–0.2)

## 2020-04-29 LAB — BASIC METABOLIC PANEL
Anion gap: 11 (ref 5–15)
BUN: 9 mg/dL (ref 6–20)
CO2: 22 mmol/L (ref 22–32)
Calcium: 7.5 mg/dL — ABNORMAL LOW (ref 8.9–10.3)
Chloride: 101 mmol/L (ref 98–111)
Creatinine, Ser: 1.25 mg/dL — ABNORMAL HIGH (ref 0.61–1.24)
GFR, Estimated: >60 mL/min (ref >60)
Glucose, Bld: 174 mg/dL — ABNORMAL HIGH (ref 70–99)
Potassium: 3.5 mmol/L (ref 3.5–5.1)
Sodium: 134 mmol/L — ABNORMAL LOW (ref 135–145)

## 2020-04-29 LAB — GLUCOSE, CAPILLARY
Glucose-Capillary: 166 mg/dL — ABNORMAL HIGH (ref 70–99)
Glucose-Capillary: 141 mg/dL — ABNORMAL HIGH (ref 70–99)
Glucose-Capillary: 130 mg/dL — ABNORMAL HIGH (ref 70–99)
Glucose-Capillary: 75 mg/dL (ref 70–99)

## 2020-04-29 LAB — POCT I-STAT 7, (LYTES, BLD GAS, ICA,H+H)
Acid-Base Excess: 2 mmol/L (ref 0.0–2.0)
Bicarbonate: 26.6 mmol/L (ref 20.0–28.0)
Calcium, Ion: 1.1 mmol/L — ABNORMAL LOW (ref 1.15–1.40)
HCT: 31 % — ABNORMAL LOW (ref 39.0–52.0)
Hemoglobin: 10.5 g/dL — ABNORMAL LOW (ref 13.0–17.0)
O2 Saturation: 98 %
Potassium: 3.5 mmol/L (ref 3.5–5.1)
Sodium: 138 mmol/L (ref 135–145)
TCO2: 28 mmol/L (ref 22–32)
pCO2 arterial: 39.2 mmHg (ref 32.0–48.0)
pH, Arterial: 7.439 (ref 7.350–7.450)
pO2, Arterial: 108 mmHg (ref 83.0–108.0)

## 2020-04-29 LAB — CULTURE, BLOOD (ROUTINE X 2)
Culture: NO GROWTH
Culture: NO GROWTH

## 2020-04-29 LAB — ABO/RH: ABO/RH(D): O POS

## 2020-04-29 SURGERY — RIGHT/LEFT HEART CATH AND CORONARY ANGIOGRAPHY
Anesthesia: LOCAL

## 2020-04-29 MED ORDER — TRANEXAMIC ACID (OHS) BOLUS VIA INFUSION
15.0000 mg/kg | INTRAVENOUS | Status: AC
  Administered 2020-04-30: 2011.5 mg via INTRAVENOUS
  Filled 2020-04-29: qty 2012

## 2020-04-29 MED ORDER — NOREPINEPHRINE 4 MG/250ML-% IV SOLN
0.0000 ug/min | INTRAVENOUS | Status: AC
  Administered 2020-04-30: 4 ug/min via INTRAVENOUS
  Filled 2020-04-29: qty 250

## 2020-04-29 MED ORDER — PLASMA-LYTE 148 IV SOLN
INTRAVENOUS | Status: DC
  Filled 2020-04-29: qty 2.5

## 2020-04-29 MED ORDER — POTASSIUM CHLORIDE 2 MEQ/ML IV SOLN
80.0000 meq | INTRAVENOUS | Status: DC
  Filled 2020-04-29: qty 40

## 2020-04-29 MED ORDER — SODIUM CHLORIDE 0.9 % IV SOLN
12.0000 g | INTRAVENOUS | Status: DC
  Administered 2020-04-29 – 2020-05-08 (×9): 12 g via INTRAVENOUS
  Filled 2020-04-29 (×11): qty 12000

## 2020-04-29 MED ORDER — CHLORHEXIDINE GLUCONATE CLOTH 2 % EX PADS
6.0000 | MEDICATED_PAD | Freq: Once | CUTANEOUS | Status: AC
  Administered 2020-04-29: 6 via TOPICAL

## 2020-04-29 MED ORDER — HEPARIN SODIUM (PORCINE) 1000 UNIT/ML IJ SOLN
INTRAMUSCULAR | Status: AC
  Filled 2020-04-29: qty 1

## 2020-04-29 MED ORDER — CHLORHEXIDINE GLUCONATE CLOTH 2 % EX PADS: 6.0000 | MEDICATED_PAD | Freq: Once | CUTANEOUS | Status: DC

## 2020-04-29 MED ORDER — MIDAZOLAM HCL 2 MG/2ML IJ SOLN
INTRAMUSCULAR | Status: AC
  Filled 2020-04-29: qty 2

## 2020-04-29 MED ORDER — PHENYLEPHRINE HCL-NACL 20-0.9 MG/250ML-% IV SOLN
30.0000 ug/min | INTRAVENOUS | Status: AC
  Administered 2020-04-30: 20 ug/min via INTRAVENOUS
  Administered 2020-04-30: 75 ug/min via INTRAVENOUS
  Administered 2020-04-30: 50 ug/min via INTRAVENOUS
  Filled 2020-04-29: qty 250

## 2020-04-29 MED ORDER — HEPARIN (PORCINE) IN NACL 1000-0.9 UT/500ML-% IV SOLN
INTRAVENOUS | Status: DC | PRN
  Administered 2020-04-29 (×2): 500 mL

## 2020-04-29 MED ORDER — MIDAZOLAM HCL 2 MG/2ML IJ SOLN
INTRAMUSCULAR | Status: DC | PRN
  Administered 2020-04-29: 2 mg via INTRAVENOUS

## 2020-04-29 MED ORDER — TEMAZEPAM 15 MG PO CAPS
15.0000 mg | ORAL_CAPSULE | Freq: Once | ORAL | Status: AC | PRN
  Administered 2020-04-29: 15 mg via ORAL
  Filled 2020-04-29: qty 1

## 2020-04-29 MED ORDER — INSULIN REGULAR(HUMAN) IN NACL 100-0.9 UT/100ML-% IV SOLN
INTRAVENOUS | Status: AC
  Administered 2020-04-30: 5.5 [IU]/h via INTRAVENOUS
  Filled 2020-04-29: qty 100

## 2020-04-29 MED ORDER — NITROGLYCERIN IN D5W 200-5 MCG/ML-% IV SOLN
2.0000 ug/min | INTRAVENOUS | Status: DC
  Filled 2020-04-29 (×2): qty 250

## 2020-04-29 MED ORDER — ONDANSETRON HCL 4 MG/2ML IJ SOLN: 4.0000 mg | Freq: Four times a day (QID) | INTRAMUSCULAR | Status: DC | PRN

## 2020-04-29 MED ORDER — LIDOCAINE HCL (PF) 1 % IJ SOLN
INTRAMUSCULAR | Status: AC
  Filled 2020-04-29: qty 30

## 2020-04-29 MED ORDER — FENTANYL CITRATE (PF) 100 MCG/2ML IJ SOLN
INTRAMUSCULAR | Status: AC
  Filled 2020-04-29: qty 2

## 2020-04-29 MED ORDER — HEPARIN SODIUM (PORCINE) 1000 UNIT/ML IJ SOLN
INTRAMUSCULAR | Status: DC | PRN
  Administered 2020-04-29: 6000 [IU] via INTRAVENOUS

## 2020-04-29 MED ORDER — DEXMEDETOMIDINE HCL IN NACL 400 MCG/100ML IV SOLN
0.1000 ug/kg/h | INTRAVENOUS | Status: AC
  Administered 2020-04-30: .5 ug/kg/h via INTRAVENOUS
  Filled 2020-04-29 (×2): qty 100

## 2020-04-29 MED ORDER — HEPARIN (PORCINE) IN NACL 1000-0.9 UT/500ML-% IV SOLN
INTRAVENOUS | Status: AC
  Filled 2020-04-29: qty 1000

## 2020-04-29 MED ORDER — LABETALOL HCL 5 MG/ML IV SOLN: 10.0000 mg | INTRAVENOUS | Status: AC | PRN

## 2020-04-29 MED ORDER — METOPROLOL TARTRATE 12.5 MG HALF TABLET
12.5000 mg | ORAL_TABLET | Freq: Once | ORAL | Status: DC
  Filled 2020-04-29: qty 1

## 2020-04-29 MED ORDER — SODIUM CHLORIDE 0.9 % IV SOLN: 250.0000 mL | INTRAVENOUS | Status: DC | PRN

## 2020-04-29 MED ORDER — CARVEDILOL 3.125 MG PO TABS
3.1250 mg | ORAL_TABLET | Freq: Two times a day (BID) | ORAL | Status: DC
  Administered 2020-04-29 – 2020-04-30 (×2): 3.125 mg via ORAL
  Filled 2020-04-29 (×2): qty 1

## 2020-04-29 MED ORDER — VERAPAMIL HCL 2.5 MG/ML IV SOLN
INTRAVENOUS | Status: AC
  Filled 2020-04-29: qty 2

## 2020-04-29 MED ORDER — VANCOMYCIN HCL 1250 MG/250ML IV SOLN
1250.0000 mg | INTRAVENOUS | Status: DC
  Filled 2020-04-29: qty 250

## 2020-04-29 MED ORDER — BISACODYL 5 MG PO TBEC
5.0000 mg | DELAYED_RELEASE_TABLET | Freq: Once | ORAL | Status: AC
  Administered 2020-04-29: 5 mg via ORAL
  Filled 2020-04-29: qty 1

## 2020-04-29 MED ORDER — EPINEPHRINE HCL 5 MG/250ML IV SOLN IN NS
0.0000 ug/min | INTRAVENOUS | Status: DC
  Filled 2020-04-29: qty 250

## 2020-04-29 MED ORDER — CHLORHEXIDINE GLUCONATE 0.12 % MT SOLN
15.0000 mL | Freq: Once | OROMUCOSAL | Status: AC
  Administered 2020-04-30: 15 mL via OROMUCOSAL
  Filled 2020-04-29: qty 15

## 2020-04-29 MED ORDER — HYDRALAZINE HCL 20 MG/ML IJ SOLN: 10.0000 mg | INTRAMUSCULAR | Status: AC | PRN

## 2020-04-29 MED ORDER — SODIUM CHLORIDE 0.9% FLUSH: 3.0000 mL | INTRAVENOUS | Status: DC | PRN

## 2020-04-29 MED ORDER — TRANEXAMIC ACID (OHS) PUMP PRIME SOLUTION
2.0000 mg/kg | INTRAVENOUS | Status: DC
  Filled 2020-04-29: qty 2.68

## 2020-04-29 MED ORDER — MAGNESIUM SULFATE 50 % IJ SOLN
40.0000 meq | INTRAMUSCULAR | Status: DC
  Filled 2020-04-29: qty 9.85

## 2020-04-29 MED ORDER — VERAPAMIL HCL 2.5 MG/ML IV SOLN
INTRAVENOUS | Status: DC | PRN
  Administered 2020-04-29: 10 mL via INTRA_ARTERIAL

## 2020-04-29 MED ORDER — SODIUM CHLORIDE 0.9 % IV SOLN
750.0000 mg | INTRAVENOUS | Status: DC
  Filled 2020-04-29: qty 750

## 2020-04-29 MED ORDER — SODIUM CHLORIDE 0.9 % IV SOLN
1.5000 g | INTRAVENOUS | Status: AC
  Administered 2020-04-30: 1.5 g via INTRAVENOUS
  Filled 2020-04-29: qty 1.5

## 2020-04-29 MED ORDER — LIDOCAINE HCL (PF) 1 % IJ SOLN
INTRAMUSCULAR | Status: DC | PRN
  Administered 2020-04-29: 4 mL

## 2020-04-29 MED ORDER — ZOLPIDEM TARTRATE 5 MG PO TABS
5.0000 mg | ORAL_TABLET | Freq: Once | ORAL | Status: AC
  Administered 2020-04-29: 5 mg via ORAL
  Filled 2020-04-29: qty 1

## 2020-04-29 MED ORDER — VANCOMYCIN HCL 1500 MG/300ML IV SOLN
1500.0000 mg | INTRAVENOUS | Status: AC
  Administered 2020-04-30: 1500 mg via INTRAVENOUS
  Filled 2020-04-29: qty 300

## 2020-04-29 MED ORDER — MILRINONE LACTATE IN DEXTROSE 20-5 MG/100ML-% IV SOLN
0.3000 ug/kg/min | INTRAVENOUS | Status: DC
  Filled 2020-04-29 (×2): qty 100

## 2020-04-29 MED ORDER — TRANEXAMIC ACID 1000 MG/10ML IV SOLN
1.5000 mg/kg/h | INTRAVENOUS | Status: AC
  Administered 2020-04-30: 1.5 mg/kg/h via INTRAVENOUS
  Filled 2020-04-29: qty 25

## 2020-04-29 MED ORDER — ATORVASTATIN CALCIUM 40 MG PO TABS
40.0000 mg | ORAL_TABLET | Freq: Every day | ORAL | Status: DC
  Administered 2020-04-29 – 2020-05-05 (×6): 40 mg via ORAL
  Filled 2020-04-29 (×6): qty 1

## 2020-04-29 MED ORDER — SODIUM CHLORIDE 0.9 % IV SOLN
INTRAVENOUS | Status: DC
  Filled 2020-04-29: qty 30

## 2020-04-29 MED ORDER — ACETAMINOPHEN 325 MG PO TABS: 650.0000 mg | ORAL_TABLET | ORAL | Status: DC | PRN

## 2020-04-29 MED ORDER — SODIUM CHLORIDE 0.9% FLUSH
3.0000 mL | Freq: Two times a day (BID) | INTRAVENOUS | Status: DC
  Administered 2020-04-29: 3 mL via INTRAVENOUS

## 2020-04-29 MED ORDER — FENTANYL CITRATE (PF) 100 MCG/2ML IJ SOLN
INTRAMUSCULAR | Status: DC | PRN
  Administered 2020-04-29: 25 ug via INTRAVENOUS

## 2020-04-29 SURGICAL SUPPLY — 13 items
CATH 5FR JL3.5 JR4 ANG PIG MP (CATHETERS) ×1 IMPLANT
CATH INFINITI 5FR JL4 (CATHETERS) ×1 IMPLANT
CATH SWAN GANZ 7F STRAIGHT (CATHETERS) ×1 IMPLANT
DEVICE RAD COMP TR BAND LRG (VASCULAR PRODUCTS) ×1 IMPLANT
GLIDESHEATH SLEND SS 6F .021 (SHEATH) ×1 IMPLANT
GLIDESHEATH SLENDER 7FR .021G (SHEATH) ×1 IMPLANT
GUIDEWIRE .025 260CM (WIRE) ×1 IMPLANT
GUIDEWIRE INQWIRE 1.5J.035X260 (WIRE) IMPLANT
INQWIRE 1.5J .035X260CM (WIRE) ×2
KIT HEART LEFT (KITS) ×2 IMPLANT
PACK CARDIAC CATHETERIZATION (CUSTOM PROCEDURE TRAY) ×2 IMPLANT
TRANSDUCER W/STOPCOCK (MISCELLANEOUS) ×2 IMPLANT
TUBING CIL FLEX 10 FLL-RA (TUBING) ×2 IMPLANT

## 2020-04-29 NOTE — Progress Notes (Signed)
   04/28/20 2012  Assess: MEWS Score  Level of Consciousness Alert  Assess: MEWS Score  MEWS Temp 0  MEWS Systolic 0  MEWS Pulse 2  MEWS RR 1  MEWS LOC 0  MEWS Score 3  MEWS Score Color Yellow  Assess: if the MEWS score is Yellow or Red  Were vital signs taken at a resting state? Yes  Focused Assessment No change from prior assessment  Early Detection of Sepsis Score *See Row Information* Low  MEWS guidelines implemented *See Row Information* No, previously yellow, continue vital signs every 4 hours  Document  Patient Outcome Other (Comment) (chronic yellow)  Progress note created (see row info) Yes   Patient has been yellow and red due to increased hr and rr. VS will continue every four hours.

## 2020-04-29 NOTE — Interval H&P Note (Signed)
History and Physical Interval Note:  04/24/2020 7:34 AM  Jonathan Shaffer  has presented today for surgery, with the diagnosis of endocarditis.  The various methods of treatment have been discussed with the patient and family. After consideration of risks, benefits and other options for treatment, the patient has consented to  Procedure(s): RIGHT/LEFT HEART CATH AND CORONARY ANGIOGRAPHY (N/A) as a surgical intervention.  The patient's history has been reviewed, patient examined, no change in status, stable for surgery.  I have reviewed the patient's chart and labs.  Questions were answered to the patient's satisfaction.    Planned diagnostic only given endocarditis with perivalvular abscess.   Larae Grooms

## 2020-04-29 NOTE — Plan of Care (Signed)
°  Problem: Coping: °Goal: Level of anxiety will decrease °Outcome: Progressing °  °

## 2020-04-29 NOTE — Progress Notes (Signed)
Occupational Therapy Treatment Patient Details Name: Jonathan Shaffer MRN: [id number] DOB: 01-08-1966 Today's Date: 04/04/2020    History of present illness Jonathan Shaffer is a 54 y.o. 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 Underwent TEE on 04/04/2020 resulting in mitral valve vegetation and perivalvular abscess. Plan for MVR this admission. Pt also with new onset DM2. PMH: thyroid disease, testicular cancer   OT comments  Pt able to perform bed mobility +2 mod to max assist followed by transfer with RW from elevated bed to chair with +2 assist to rise and +2 min assist to take pivotal steps to chair. Pt with decreased control descent to chair. Pt left up in chair with wife in room and needs met.   Follow Up Recommendations  CIR    Equipment Recommendations  3 in 1 bedside commode;Tub/shower bench    Recommendations for Other Services      Precautions / Restrictions Precautions Precautions: Fall Restrictions Other Position/Activity Restrictions: per  Dr. Erlinda Hong op note, no limits for ROM/activity       Mobility Bed Mobility Overal bed mobility: Needs Assistance Bed Mobility: Rolling;Sidelying to Sit Rolling: +2 Min assist Sidelying to sit:  +2 Max assist       General bed mobility comments: pulled on therapist's hand and used bed rail to roll toward L, max assist to raise trunk, increased time to scoot hips to EOB  Transfers Overall transfer level: Needs assistance Equipment used: Rolling walker (2 wheeled) Transfers: Sit to/from [facility] Sit to Stand: Max assist;From elevated surface;+2 physical assistance Stand pivot transfers: +2 physical assistance;Min assist       General  transfer comment: assist to rise and steady, cues for upright posture, steadying assist as pt took pivotal steps to chair with RW, assist to control descent to chair, transfer very fatiguing     Balance Overall balance assessment: Needs assistance   Sitting balance-Leahy Scale: Fair Sitting balance - Comments: close guard assist     Standing balance-Leahy Scale: Poor                             ADL either performed or assessed with clinical judgement   ADL Overall ADL's : Needs assistance/impaired                 Upper Body Dressing : Maximal assistance;Sitting   Lower Body Dressing: Total assistance;Bed level                       Vision       Perception     Praxis      Cognition Arousal/Alertness: Awake/alert Behavior During Therapy: Flat affect Overall Cognitive Status: Impaired/Different from baseline Area of Impairment: Following commands;Problem solving                       Following Commands: Follows one step commands with increased time     Problem Solving: Slow processing          Exercises     Shoulder Instructions       General Comments      Pertinent Vitals/ Pain       Pain Assessment: Faces Faces Pain Scale: Hurts even  more Pain Location: R shoulder, generalized  Pain Descriptors / Indicators: Grimacing;Guarding;Discomfort Pain Intervention(s): Monitored during session;Repositioned  Home Living                                          Prior Functioning/Environment              Frequency  Min 2X/week        Progress Toward Goals  OT Goals(current goals can now be found in the care plan section)  Progress towards OT goals: Progressing toward goals  Acute Rehab OT Goals Patient Stated Goal: to return to PLOF OT Goal Formulation: With patient Time For Goal Achievement: 05/12/20 Potential to Achieve Goals: Good  Plan Discharge plan remains appropriate     Co-evaluation    PT/OT/SLP Co-Evaluation/Treatment: Yes Reason for Co-Treatment: For patient/therapist safety;Complexity of the patient's impairments (multi-system involvement)   OT goals addressed during session: Proper use of Adaptive equipment and DME;Strengthening/ROM      AM-PAC OT "6 Clicks" Daily Activity     Outcome Measure   Help from another person eating meals?: A Little Help from another person taking care of personal grooming?: A Little Help from another person toileting, which includes using toliet, bedpan, or urinal?: Total Help from another person bathing (including washing, rinsing, drying)?: A Lot Help from another person to put on and taking off regular upper body clothing?: A Lot Help from another person to put on and taking off regular lower body clothing?: Total 6 Click Score: 12    End of Session Equipment Utilized During Treatment: Rolling walker;Gait belt  OT Visit Diagnosis: Pain;Muscle weakness (generalized) (M62.81)   Activity Tolerance Patient tolerated treatment well   Patient Left in chair;with call bell/phone within reach;with chair alarm set;with family/visitor present   Nurse Communication Mobility status;Need for lift equipment (may use stedy)        Time: 0881-1031 OT Time Calculation (min): 29 min  Charges: OT General Charges $OT Visit: 1 Visit OT Treatments $Therapeutic Activity: 8-22 mins  Jonathan Shaffer, OTR/L Acute Rehabilitation Services Pager: [phone] Office: [phone]   Malka So 05/02/2020, 3:54 PM

## 2020-04-29 NOTE — Progress Notes (Addendum)
Progress Note  Patient Name: Jonathan Shaffer Date of Encounter: 04/10/2020  [facility] Cardiologist: Candee Furbish, MD   Subjective   No CP, denies SOB but resp rate is increased, breathing is shallow Per wife, pt is long-term snorer, cannot sleep on his back but has to do so here, not getting any rest, ?OSA Pt w/ reverse rxn to benadryl and xanax, made him agitated Infections are symptomatically improving, L arm cellulits and R shoulder septic arthritis Pt feels very anxious, has been here a week  Inpatient Medications    Scheduled Meds:  chlorhexidine  15 mL Mouth Rinse BID   Chlorhexidine Gluconate Cloth  6 each Topical Daily   enoxaparin (LOVENOX) injection  60 mg Subcutaneous Q24H   insulin aspart  2-6 Units Subcutaneous TID WC   insulin aspart  7 Units Subcutaneous TID WC   insulin glargine  30 Units Subcutaneous Daily   mouth rinse  15 mL Mouth Rinse q12n4p   sodium chloride flush  10-40 mL Intracatheter Q12H   sodium chloride flush  3 mL Intravenous Q12H   Continuous Infusions:  sodium chloride     lactated ringers 125 mL/hr at 04/17/2020 0628   nafcillin IV 2 g (04/11/2020 0905)   PRN Meds: sodium chloride, acetaminophen **OR** acetaminophen, ALPRAZolam, dextrose, fentaNYL (SUBLIMAZE) injection, hydrALAZINE, labetalol, lip balm, ondansetron **OR** ondansetron (ZOFRAN) IV, sodium chloride flush, sodium chloride flush   Vital Signs    Vitals:   04/05/2020 0754 04/04/2020 0759 04/14/2020 0804 04/18/2020 0830  BP: (!) 130/95 (!) 144/100 (!) 141/100 136/84  Pulse: (!) 108 (!) 109 (!) 107 (!) 109  Resp: (!) 25 (!) 31 (!) 22 20  Temp:    99.5 F (37.5 C)  TempSrc:    Oral  SpO2: 97% 97% 96% 99%  Weight:      Height:        Intake/Output Summary (Last 24 hours) at 04/20/2020 0953 Last data filed at 04/26/2020 0855 Gross per 24 hour  Intake 3213.69 ml  Output 3700 ml  Net -486.31 ml   Last 3 Weights 04/24/2020 04/28/2020 04/23/2020  Weight (lbs) 295 lb 10.2 oz 284  lb 2.8 oz 264 lb 15.9 oz  Weight (kg) 134.1 kg 128.9 kg 120.2 kg      Telemetry    SR, ST, mostly ST - Personally Reviewed  ECG    None today - Personally Reviewed  Physical Exam   GEN: Mild distress.   Neck: JVD approx 9 cm Cardiac: RRR, systolic flow murmur, rubs, or gallops.  Respiratory: slightly decreased BS bases bilaterally. GI: Soft, nontender, non-distended  MS: No edema; No deformity. R arm still very warm, no erythema seen, L shoulder bandaged, not disturbed Neuro:  Nonfocal  Psych: Anxious affect   Labs    High Sensitivity Troponin:  No results for input(s): TROPONINIHS in the last 720 hours.    Chemistry Recent Labs  Lab 04/30/2020 0500 04/26/20 0958 04/10/2020 0228 04/28/20 0014 04/29/20 0333  NA 131*   < > 131* 134* 134*  K 3.1*   < > 3.4* 3.9 3.5  CL 100   < > 102 104 101  CO2 22   < > 22 19* 22  GLUCOSE 237*   < > 137* 188* 174*  BUN 26*   < > 17 14 9   CREATININE 1.41*   < > 1.13 1.38* 1.25*  CALCIUM 7.1*   < > 7.0* 7.3* 7.5*  PROT 5.2*  --  5.3* 5.5*  --  ALBUMIN 1.6*  --  1.6* 1.3*  --   AST 24  --  20 24  --   ALT 36  --  21 20  --   ALKPHOS 91  --  73 83  --   BILITOT 2.3*  --  2.1* 1.7*  --   GFRNONAA 59*   < > >60 >60 >60  ANIONGAP 9   < > 7 11 11    < > = values in this interval not displayed.     Hematology Recent Labs  Lab 04/30/2020 0228 04/28/20 0014 04/25/2020 0333  WBC 16.3* 16.0* 17.6*  RBC 3.44* 3.44* 3.41*  HGB 10.1* 10.2* 10.2*  HCT 31.4* 30.9* 30.8*  MCV 91.3 89.8 90.3  MCH 29.4 29.7 29.9  MCHC 32.2 33.0 33.1  RDW 15.2 15.4 15.6*  PLT 347 436* 530*   Lab Results  Component Value Date   ALT 20 04/28/2020   AST 24 04/28/2020   ALKPHOS 83 04/28/2020   BILITOT 1.7 (H) 04/28/2020   No results found for: CHOL, HDL, LDLCALC, LDLDIRECT, TRIG, CHOLHDL No results found for: TSH Lab Results  Component Value Date   HGBA1C 11.3 (H) 04/19/2020    BNPNo results for input(s): BNP, PROBNP in the last 168 hours.   DDimer  No results for input(s): DDIMER in the last 168 hours.   Radiology    CARDIAC CATHETERIZATION  Result Date: 04/20/2020  Prox Cx lesion is 25% stenosed.  Mid LAD lesion is 10% stenosed.  LV end diastolic pressure is moderately elevated. LVEDP 29 mm Hg.  There is no aortic valve stenosis.  Hemodynamic findings consistent with mild pulmonary hypertension.  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 Continue with plans for mitral valve intervention with Dr. Orvan Seen.   ECHO TEE  Result Date: 04/12/2020    TRANSESOPHOGEAL ECHO REPORT   Patient Name:   ISAUL LANDI Date of Exam: 04/11/2020 Medical Rec #:  [id number]      Height:       75.0 in Accession #:    [id number]     Weight:       265.0 lb Date of Birth:  Feb 28, 1966     BSA:          2.473 m Patient Age:    54 years       BP:           146/114 mmHg Patient Gender: M              HR:           104 bpm. Exam Location:  Inpatient Procedure: Transesophageal Echo Indications:    Bacteremia 790.7 / R78.81  History:        Patient has prior history of Echocardiogram examinations, most                 recent 04/14/2020. Risk Factors:Diabetes.  Sonographer:    Bernadene Person RDCS Referring Phys: [id number] CADENCE H FURTH PROCEDURE: After discussion of the risks and benefits of a TEE, an informed consent was obtained from the patient. The transesophogeal probe was passed without difficulty through the esophogus of the patient. Local oropharyngeal anesthetic was provided with Cetacaine. Sedation performed by different physician. The patient was monitored while under deep sedation. Anesthestetic sedation was provided intravenously by Anesthesiology: 400.5mg  of Propofol, 100mg  of  Lidocaine. The patient's vital signs; including heart rate, blood pressure, and oxygen saturation; remained stable throughout the procedure. The patient developed no  complications during the procedure. IMPRESSIONS  1. There is a large echodensity that is partially fixed (below the valve on the ventricular side and possibly representing an abscess) and partially hypermobile, multilobar ( on the left atrial side) consistent with a vegetation with high risk for embolization. This involves P2,3 and A3 leaflets. Vegetation size is 4.4 x 2.4 cm.  2. Left ventricular ejection fraction, by estimation, is 55 to 60%. The left ventricle has normal function. The left ventricle has no regional wall motion abnormalities. Left ventricular diastolic function could not be evaluated.  3. Right ventricular systolic function is normal. The right ventricular size is normal.  4. No left atrial/left atrial appendage thrombus was detected.  5. The mitral valve is normal in structure. Mild mitral valve regurgitation. No evidence of mitral stenosis.  6. The aortic valve is normal in structure. Aortic valve regurgitation is not visualized. No aortic stenosis is present.  7. The inferior vena cava is normal in size with greater than 50% respiratory variability, suggesting right atrial pressure of 3 mmHg. Conclusion(s)/Recommendation(s): Normal biventricular function without evidence of hemodynamically significant valvular heart disease. CT surgery and hospitalist Dr Avon Gully were notified at 1:10 pm.The patient will be transferred to the [facility].  FINDINGS  Left Ventricle: Left ventricular ejection fraction, by estimation, is 55 to 60%. The left ventricle has normal function. The left ventricle has no regional wall motion abnormalities. The left ventricular internal cavity size was normal in size. There is  no left ventricular hypertrophy. Left ventricular diastolic function could not be evaluated. Right Ventricle: The right ventricular size is normal. No increase in right ventricular wall thickness. Right ventricular systolic function is normal. Left Atrium: Left atrial size was normal in size. No left  atrial/left atrial appendage thrombus was detected. Right Atrium: Right atrial size was normal in size. Pericardium: There is no evidence of pericardial effusion. Mitral Valve: There is a large echodensity that is partially fixed (below the valve on the ventricular side and possibly representing an abscess) and partially hypermobile, multilobar ( on the left atrial side) consistent with a vegetation with high risk  for embolization. This involves P2,3 and A3 leaflets. The mitral valve is normal in structure. Mild mitral valve regurgitation. No evidence of mitral valve stenosis. Tricuspid Valve: The tricuspid valve is normal in structure. Tricuspid valve regurgitation is mild . No evidence of tricuspid stenosis. There is no evidence of tricuspid valve vegetation. Aortic Valve: The aortic valve is normal in structure. Aortic valve regurgitation is not visualized. No aortic stenosis is present. There is no evidence of aortic valve vegetation. Pulmonic Valve: The pulmonic valve was normal in structure. Pulmonic valve regurgitation is not visualized. No evidence of pulmonic stenosis. There is no evidence of pulmonic valve vegetation. Aorta: The aortic root is normal in size and structure. There is minimal (Grade I) plaque. Venous: The inferior vena cava is normal in size with greater than 50% respiratory variability, suggesting right atrial pressure of 3 mmHg. IAS/Shunts: The interatrial septum appears to be lipomatous. No atrial level shunt detected by color flow Doppler. There is no evidence of an atrial septal defect. [name] MD Electronically signed by [name] MD Signature Date/Time: 04/20/2020/1:29:48 PM    Final     Cardiac Studies   CARDIAC CATH: 04/16/2020 Prox Cx lesion is 25% stenosed. Mid LAD lesion is 10% stenosed. LV end  diastolic pressure is moderately elevated. LVEDP 29 mm Hg. There is no aortic valve stenosis. Hemodynamic findings consistent with mild pulmonary hypertension. Ao  sat 98%, PA sat 68%, PA pressure 41/20, mean PA 31 mm Hg; mean PCWP 28 mm Hg; CO 8.1 L/min; CI 3.2   Nonobstructive CAD.    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    Continue with plans for mitral valve intervention with Dr. Orvan Seen.     ECHO: 05/01/2020  1. Left ventricular ejection fraction, by estimation, is 55 to 60%. The  left ventricle has normal function. The left ventricle has no regional  wall motion abnormalities. Left ventricular diastolic function could not  be evaluated.   2. The mitral valve is grossly normal. No evidence of mitral valve  regurgitation. No evidence of mitral stenosis.   3. The aortic valve is tricuspid. Aortic valve regurgitation is not  visualized. No aortic stenosis is present.   TEE: 04/29/2020  1. MITRAL VALVE: There is a large echodensity that is partially fixed (below the valve on the ventricular side and possibly representing an abscess) and  partially hypermobile, multilobar ( on the left atrial side) consistent  with a vegetation with high risk for embolization. This involves P2,3 and A3 leaflets. Vegetation size is 4.4 x 2.4 cm.   2. Left ventricular ejection fraction, by estimation, is 55 to 60%. The  left ventricle has normal function. The left ventricle has no regional  wall motion abnormalities. Left ventricular diastolic function could not  be evaluated.   3. Right ventricular systolic function is normal. The right ventricular  size is normal.   4. No left atrial/left atrial appendage thrombus was detected.   5. NO, INCORRECT The mitral valve is normal in structure. Mild mitral valve  regurgitation. No evidence of mitral stenosis.   6. The aortic valve is normal in structure. Aortic valve regurgitation is  not visualized. No aortic stenosis is present.   7. The inferior vena cava is normal in size with greater than 50%  respiratory variability, suggesting right atrial pressure of 3 mmHg.   Conclusion(s)/Recommendation(s): Normal  biventricular function without  evidence of hemodynamically significant valvular heart disease. CT surgery  and hospitalist Dr Avon Gully were notified at 1:10 pm.The patient will be  transferred to the [facility].   Patient Profile     54 y.o. male with a hx of MSSA bacteremia now with mitral valve endocarditis and abscess, untreated DM, hyperlipidemia, hx of testicular cancer, and thyroid disease who was admitted 11/19 with fever, joint pain and rash. Sepsis, acute metabolic encephalopathy, DKA, MSSA bacteremia, R shoulder abscess, MV valve vegetation seen on TEE.  Assessment & Plan    1. Mitral valve endocarditis - per ID, metastatic MSSA infection w/ MV vegetation, splenic infarct and joint infections - TEE results above - cath results above, no critical CAD - Dr Orvan Seen has seen, orthopantogram requested, will order - per note "ideally would have dental exam; if plan for surgery delayed will reach out to Dentistry - will likely need MV surgery this admit   2. Non-obs CAD - needs aggressive CRF reduction - ck lipids, add Lipitor 40 mg qd - on ASA - HR elevated, will add low-dose Coreg  3. DM - A1c 11.3, new dx - no meds pta - per IM  4. HTN - SBP elevated on admit - no meds pta - add BB and follow  5. ?OSA - had episodes of hypoxia overnight - pt reports PND - ok for  inpatient CPAP and outpatient sleep study  Otherwise, per IM Principal Problem:   Abscess of right shoulder Active Problems:   Severe sepsis ([place])   Splenic infarct   Diabetic acidosis without coma (H[facility])   AKI (acute kidney injury) ([place])   Endocarditis of mitral valve   MSSA bacteremia  For questions or updates, please contact [facility] Please consult [email or web address] for contact info under        Signed, Rosaria Ferries, PA-C  04/10/2020, 9:53 AM     Personally seen and examined. Agree with APP above with edits made to the above and the following comments: Briefly 54 yo M with mitral valve  infective endocarditis, MR, and possible abscess.  Planning for surgery. Will discussed with surgical team if they need anything further prior to surgical planning R radial site without hematoma or bruit; continue to let air out of TR band per protocol, distal extremity perfused.

## 2020-04-29 NOTE — Progress Notes (Signed)
Physical Therapy Treatment Patient Details Name: Jonathan Shaffer MRN: [id number] DOB: 11-18-1965 Today's Date: 04/13/2020    History of Present Illness Jonathan Shaffer is a 54 y.o. 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 Underwent TEE on 04/12/2020 resulting in mitral valve vegetation and perivalvular abscess. Plan for MVR this admission. Pt also with new onset DM2. PMH: thyroid disease, testicular cancer    PT Comments    Pt was seen for bed mobility and transfers to chair with 2 person help due to discomfort and weakness on hips and postural changes.  His recovery of balance required RW and dense verbal and tactile cues to recover his control of standing balance.  Follow up acutely to continue to work on strength and tolerance for standing.     Follow Up Recommendations  Home health PT;Supervision/Assistance - 24 hour     Equipment Recommendations       Recommendations for Other Services OT consult     Precautions / Restrictions Precautions Precautions: Fall Restrictions Weight Bearing Restrictions: No RUE Weight Bearing: Weight bearing as tolerated Other Position/Activity Restrictions: per  Dr. Erlinda Hong op note, no limits for ROM/activity    Mobility  Bed Mobility Overal bed mobility: Needs Assistance Bed Mobility: Rolling;Sidelying to Sit Rolling: Min assist Sidelying to sit: Max assist;+2 for physical assistance       General bed mobility comments: pt used bedrail and direct assist from TP  Transfers Overall transfer level: Needs assistance Equipment used: Rolling walker (2 wheeled) Transfers: Sit to/from [facility] Sit to Stand: Max assist;From elevated surface Stand pivot  transfers: Min assist;+2 physical assistance;+2 safety/equipment       General transfer comment: assistance to stand with cues for pulling upright from a squat, struggling to extend his hips  Ambulation/Gait Ambulation/Gait assistance: Min assist;+2 physical assistance;+2 safety/equipment Gait Distance (Feet): 5 Feet Assistive device: Rolling walker (2 wheeled);2 person hand held assist Gait Pattern/deviations: Step-to pattern;Wide base of support;Decreased stride length Gait velocity: reduced Gait velocity interpretation: <1.31 ft/sec, indicative of household ambulator General Gait Details: pt is assisted with help to pivot and step, pt is not confident about getting to the chair   Stairs             Wheelchair Mobility    Modified Rankin (Stroke Patients Only)       Balance Overall balance assessment: Needs assistance   Sitting balance-Leahy Scale: Fair       Standing balance-Leahy Scale: Poor                              Cognition Arousal/Alertness: Awake/alert Behavior During Therapy: Flat affect Overall Cognitive Status: Impaired/Different from baseline Area of Impairment: Safety/judgement;Problem solving;Following commands;Memory                     Memory: Decreased short-term memory Following Commands: Follows one step commands inconsistently;Follows one step commands with increased time Safety/Judgement: Decreased awareness of safety;Decreased awareness of deficits   Problem Solving: Slow processing;Requires verbal cues;Requires tactile cues        Exercises      General Comments General comments (skin integrity, edema, etc.): pt is able to be assisted with cues for protection of shoulder  Pertinent Vitals/Pain Pain Assessment: Faces Faces Pain Scale: Hurts even more Pain Location: R shoulder and back Pain Descriptors / Indicators: Guarding;Grimacing;Operative site guarding Pain Intervention(s): Monitored during  session;Repositioned    Home Living                      Prior Function            PT Goals (current goals can now be found in the care plan section) Acute Rehab PT Goals Patient Stated Goal: to return to PLOF Progress towards PT goals: Progressing toward goals    Frequency    Min 3X/week      PT Plan Current plan remains appropriate    Co-evaluation PT/OT/SLP Co-Evaluation/Treatment: Yes Reason for Co-Treatment: For patient/therapist safety PT goals addressed during session: Balance;Mobility/safety with mobility        AM-PAC PT "6 Clicks" Mobility   Outcome Measure  Help needed turning from your back to your side while in a flat bed without using bedrails?: A Lot Help needed moving from lying on your back to sitting on the side of a flat bed without using bedrails?: A Lot Help needed moving to and from a bed to a chair (including a wheelchair)?: A Lot Help needed standing up from a chair using your arms (e.g., wheelchair or bedside chair)?: A Lot Help needed to walk in hospital room?: A Lot Help needed climbing 3-5 steps with a railing? : Total 6 Click Score: 11    End of Session Equipment Utilized During Treatment: Gait belt Activity Tolerance: Patient limited by fatigue;Patient limited by pain Patient left: in chair;with call bell/phone within reach   PT Visit Diagnosis: Difficulty in walking, not elsewhere classified (R26.2);Pain Pain - Right/Left: Right Pain - part of body: Shoulder     Time: 8110-3159 PT Time Calculation (min) (ACUTE ONLY): 33 min  Charges:  $Therapeutic Activity: 8-22 mins                   Ramond Dial 05/02/2020, 10:42 PM  Mee Hives, PT MS Acute Rehab Dept. Number: [place] [facility] [place]

## 2020-04-29 NOTE — Progress Notes (Signed)
[facility] for Infectious Disease  Date of Admission:  05/02/2020           Current antibiotics: Nafcillin  Reason for visit: Follow up on MSSA bacteremia   ASSESSMENT AND PLAN:   Jonathan Shaffer is a 54 y.o. male with metastatic MSSA infection transferred from [place] to [facility] for CT surgery evaluation after TEE found 4.4cm MV vegetation and perivalvular abscess.  His repeat blood cultures remain clear and he is tolerating antibiotics thus far.  His WBC remains elevated but stable.   # MSSA bacteremia # MV endocarditis and perivalvular abscess # Septic emboli to CNS # Septic arthritis right AC joint and OM distal clavicle and acromion and intradeltoid abscess and subacromial bursitis s/p  I&D 04/28/2020.  Cultures = MSSA # Splenic infarct  -- continue nafcillin 2gm q4h -- awaiting CT surgery when appropriate -- follow repeat cultures -- will follow -- monitor for other nidus of infection    SUBJECTIVE:   Went to cath this AM.  Wife reports no significant blockages.  Having some bleeding for cath site.  Low grade temps but no other new complaints.  No n/v/d.  No new joint pains, neuro deficits.     Review of Systems: As noted above.  All other systems reviewed and are negative.   OBJECTIVE:   No Known Allergies  Blood pressure 136/84, pulse (!) 109, temperature 99.5 F (37.5 C), temperature source Oral, resp. rate 20, height 6\' 3"  (1.905 m), weight 134.1 kg, SpO2 99 %. Body mass index is 36.95 kg/m.  Physical Exam Constitutional:      General: He is not in acute distress.    Appearance: Normal appearance.  HENT:     Head: Normocephalic and atraumatic.  Eyes:     Extraocular Movements: Extraocular movements intact.     Conjunctiva/sclera: Conjunctivae normal.  Cardiovascular:     Comments: Bleeding noted from radial cath site on right.  Pulmonary:     Effort: Pulmonary effort is normal. No respiratory distress.  Abdominal:     Tenderness:  There is no abdominal tenderness.  Skin:    General: Skin is warm and dry.  Neurological:     General: No focal deficit present.     Mental Status: He is alert and oriented to person, place, and time.  Psychiatric:        Mood and Affect: Mood normal.        Behavior: Behavior normal.               Lab Results & Microbiology Lab Results  Component Value Date   WBC 17.6 (H) 04/25/2020   HGB 10.2 (L) 04/08/2020   HCT 30.8 (L) 04/11/2020   MCV 90.3 04/30/2020   PLT 530 (H) 04/08/2020    Lab Results  Component Value Date   NA 134 (L) 04/20/2020   K 3.5 04/15/2020   CO2 22 04/17/2020   GLUCOSE 174 (H) 04/14/2020   BUN 9 04/15/2020   CREATININE 1.25 (H) 04/26/2020   CALCIUM 7.5 (L) 05/03/2020   GFRNONAA >60 04/15/2020    Lab Results  Component Value Date   ALT 20 04/28/2020   AST 24 04/28/2020   ALKPHOS 83 04/28/2020   BILITOT 1.7 (H) 04/28/2020     I have reviewed the micro and lab results in Epic.  Imaging CARDIAC CATHETERIZATION  Result Date: 04/16/2020  Prox Cx lesion is 25% stenosed.  Mid LAD lesion is 10% stenosed.  LV  end diastolic pressure is moderately elevated. LVEDP 29 mm Hg.  There is no aortic valve stenosis.  Hemodynamic findings consistent with mild pulmonary hypertension.  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 Continue with plans for mitral valve intervention with Dr. Orvan Seen.   ECHO TEE  Result Date: 04/26/2020    TRANSESOPHOGEAL ECHO REPORT   Patient Name:   Jonathan Shaffer Date of Exam: 04/19/2020 Medical Rec #:  [id number]      Height:       75.0 in Accession #:    [id number]     Weight:       265.0 lb Date of Birth:  12-Jan-1966     BSA:          2.473 m Patient Age:    77 years       BP:           146/114 mmHg Patient Gender: M              HR:           104 bpm. Exam Location:  Inpatient Procedure:  Transesophageal Echo Indications:    Bacteremia 790.7 / R78.81  History:        Patient has prior history of Echocardiogram examinations, most                 recent 04/06/2020. Risk Factors:Diabetes.  Sonographer:    Bernadene Person RDCS Referring Phys: [id number] CADENCE H FURTH PROCEDURE: After discussion of the risks and benefits of a TEE, an informed consent was obtained from the patient. The transesophogeal probe was passed without difficulty through the esophogus of the patient. Local oropharyngeal anesthetic was provided with Cetacaine. Sedation performed by different physician. The patient was monitored while under deep sedation. Anesthestetic sedation was provided intravenously by Anesthesiology: 400.5mg  of Propofol, 100mg  of Lidocaine. The patient's vital signs; including heart rate, blood pressure, and oxygen saturation; remained stable throughout the procedure. The patient developed no complications during the procedure. IMPRESSIONS  1. There is a large echodensity that is partially fixed (below the valve on the ventricular side and possibly representing an abscess) and partially hypermobile, multilobar ( on the left atrial side) consistent with a vegetation with high risk for embolization. This involves P2,3 and A3 leaflets. Vegetation size is 4.4 x 2.4 cm.  2. Left ventricular ejection fraction, by estimation, is 55 to 60%. The left ventricle has normal function. The left ventricle has no regional wall motion abnormalities. Left ventricular diastolic function could not be evaluated.  3. Right ventricular systolic function is normal. The right ventricular size is normal.  4. No left atrial/left atrial appendage thrombus was detected.  5. The mitral valve is normal in structure. Mild mitral valve regurgitation. No evidence of mitral stenosis.  6. The aortic valve is normal in structure. Aortic valve regurgitation is not visualized. No aortic stenosis is present.  7. The inferior vena cava is normal in size  with greater than 50% respiratory variability, suggesting right atrial pressure of 3 mmHg. Conclusion(s)/Recommendation(s): Normal biventricular function without evidence of hemodynamically significant valvular heart disease. CT surgery and hospitalist Dr Avon Gully were notified at 1:10 pm.The patient will be transferred to the [facility].  FINDINGS  Left Ventricle: Left ventricular ejection fraction, by estimation, is 55 to 60%. The left ventricle has normal function. The left ventricle has  no regional wall motion abnormalities. The left ventricular internal cavity size was normal in size. There is  no left ventricular hypertrophy. Left ventricular diastolic function could not be evaluated. Right Ventricle: The right ventricular size is normal. No increase in right ventricular wall thickness. Right ventricular systolic function is normal. Left Atrium: Left atrial size was normal in size. No left atrial/left atrial appendage thrombus was detected. Right Atrium: Right atrial size was normal in size. Pericardium: There is no evidence of pericardial effusion. Mitral Valve: There is a large echodensity that is partially fixed (below the valve on the ventricular side and possibly representing an abscess) and partially hypermobile, multilobar ( on the left atrial side) consistent with a vegetation with high risk  for embolization. This involves P2,3 and A3 leaflets. The mitral valve is normal in structure. Mild mitral valve regurgitation. No evidence of mitral valve stenosis. Tricuspid Valve: The tricuspid valve is normal in structure. Tricuspid valve regurgitation is mild . No evidence of tricuspid stenosis. There is no evidence of tricuspid valve vegetation. Aortic Valve: The aortic valve is normal in structure. Aortic valve regurgitation is not visualized. No aortic stenosis is present. There is no evidence of aortic valve vegetation. Pulmonic Valve: The pulmonic valve was normal in structure. Pulmonic valve regurgitation is  not visualized. No evidence of pulmonic stenosis. There is no evidence of pulmonic valve vegetation. Aorta: The aortic root is normal in size and structure. There is minimal (Grade I) plaque. Venous: The inferior vena cava is normal in size with greater than 50% respiratory variability, suggesting right atrial pressure of 3 mmHg. IAS/Shunts: The interatrial septum appears to be lipomatous. No atrial level shunt detected by color flow Doppler. There is no evidence of an atrial septal defect. [name] MD Electronically signed by [name] MD Signature Date/Time: 04/28/2020/1:29:48 PM    Final       [facility] Infectious [facility] [phone] pager 04/28/2020, 9:34 AM

## 2020-04-29 NOTE — Progress Notes (Signed)
TCTS progress note  LHC results reviewed and discussed with the patient. He is eager/anxious for definitive repair of infected endocarditis. I think it is reasonable to proceed tomorrow since his WBC remains elevated but he is not toxic in appearance. There is some reasonable likelihood he will need epicardial PPM. He has requested mechanical valve replacement understanding likelihood for needing lifelong anticoagulation.  Jonathan Shaffer, Jonathan Shaffer

## 2020-04-29 NOTE — Progress Notes (Signed)
PROGRESS NOTE    Jonathan Shaffer  CHE:[id number] DOB: January 24, 1966 DOA: 04/17/2020 PCP: Patient, No Pcp Per   Brief Narrative:  Mr. Pullara is a 54 y/o gentleman admitted with sepsis, found to have MSSA bacteremia. He developed sudden onset nausea and vomiting and chills last week and later developed fevers and R shoulder pain. He was evaluated and felt to have a sprain. He was treated with an OP steroid injection and started oral steroids. He developed a painful rash on his hand. His progressive malaise, fevers, rash, and joint pain prompted return to the ED. At presentation he was febrile, tachycardic, tachypneic, with lactic acidosis. He was in DKA at presentation. He has a previous history of "high diabetes markers", which he reports he was not following up on. He was started on empiric vanc, zosyn & cefepime, and doxycycline. He has been evaluated by orthopaedics for possible septic arthritis of his R AC joint. ID has been consulted for MSSA+ blood cultures. No known history of IVDU or injuries prior to becoming ill. 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  Continue to have fever of 100.4 and leukocytosis.  Lactic acid: WNL.  He is tachycardic and tachypneic. -TEE from 11/24 shows 5 cm vegetation. -Appreciate ID assistance-continue cefazolin given sensitivities. -Repeat blood culture-follow -PICC line removed-on line holiday currently. -Ortho following irrigation and debridement of the right shoulder.  Mitral valve endocarditis and perivalvular abscess: -Source of infection: Septic arthritis, deltoid abscess, clavicular osteomyelitis -TTE on 11/19 showed normal ejection fraction with  no significant valvular dysfunction.  - Reviewed TEE from 11/24 -Cardiology consulted-patient underwent R/LHC on 11/26 which shows nonobstructive CAD, mild pulmonary hypertension. -Await CT surgery recommendations -Continue cefazolin 2 g every 4 hours -Follow repeat cultures-so far negative.  Septic arthritis in right AC joint and OM distal clavicle and acromion and into the deltoid abscess and subacromial bursitis status post irrigation and drainage on 04/07/2020: -Ortho is following.  Appreciate input.  Right shoulder abscess culture shows pansensitive staph. -Patient has serous drainage from his right shoulder  -Penrose drain removed today and dry dressing was applied by Ortho  Splenic infarcts: -As per ID note from 11/24.  ID discussed with general surgery-they do not do splenectomy in these cases abscess/infarct.  Need to call surgery again if no source outside of the spleen  Acute encephalopathy: Multifactorial -Acute embolic infarcts versus hospital delirium versus underlying sepsis -Continue supportive care.  Patient's mentation is improving.  Reviewed MRI brain. -PT consulted  AKI: Improving.  Continue to hold nephrotoxic medication.  Monitor kidney function closely.  Hypokalemia: Resolved  Hyponatremia: Improving.  Uncontrolled new onset diabetes mellitus: Was in DKA before now resolved. -A1c: 11.3% -Continue sliding scale insulin.  Glargine 30 units, mealtime coverage to 7 units 3 times daily AC -Goal blood glucose 140-180 while critically ill. -Continue to monitor blood sugar closely  Acute anemia: -Suspect this is mixed secondary to critical illness versus dilutional. -H&H is stable.  Continue to monitor.  Transfuse if hemoglobin less than 7.  Hyperbilirubinemia likely in the setting of underlying sepsis. -LFTs: WNL.  Is improving from 2.3-1.7.  Wrist pain due to cellulitis: Continue antibiotics as above  Morbid obesity with BMI of 35 -Diet  modification/exercise/weight loss recommended.  Thrombocytosis:  Platelet: 530.  Likely reactive secondary to underlying infection.  Continue to monitor.  DVT prophylaxis: Lovenox Code Status: Full code Family Communication: Patient's wife  present at bedside.  Plan of care discussed with patient in length and he verbalized understanding and agreed with it. Disposition Plan: To be determined  Consultants:   ID  Cardiology  Cardiothoracic surgery  Procedures:   TEE  TTE irrigation and drainage  MRI shoulder  MRI brain  MRI wrist  Antimicrobials:   Nafcillin  Status is: Inpatient   Dispo: The patient is from: Home              Anticipated d/c is to: Home              Anticipated d/c date is: > 3 days              Patient currently is not medically stable to d/c.   Subjective: Patient seen and examined.  Feels weak, tired.  Denies chest pain, difficulty in breathing, palpitation, nausea, vomiting, abdominal pain.  Continues to have drainage from right shoulder site.  Objective: Vitals:   04/25/2020 0754 04/14/2020 0759 04/07/2020 0804 04/23/2020 0830  BP: (!) 130/95 (!) 144/100 (!) 141/100 136/84  Pulse: (!) 108 (!) 109 (!) 107 (!) 109  Resp: (!) 25 (!) 31 (!) 22 20  Temp:    99.5 F (37.5 C)  TempSrc:    Oral  SpO2: 97% 97% 96% 99%  Weight:      Height:        Intake/Output Summary (Last 24 hours) at 04/23/2020 0951 Last data filed at 04/16/2020 0855 Gross per 24 hour  Intake 3213.69 ml  Output 3700 ml  Net -486.31 ml   Filed Weights   05/03/2020 1159 04/28/20 0430 04/26/2020 0454  Weight: 120.2 kg 128.9 kg 134.1 kg    Examination: General exam: Appears calm and comfortable, on room air, obese, appears weak, sick,  Respiratory system: Clear to auscultation. Respiratory effort normal. Cardiovascular system: S1 & S2 heard, RRR. No JVD, murmurs, rubs, gallops or clicks.  1+ bilateral pitting edema positive Gastrointestinal system: Abdomen is nondistended, soft  and nontender. No organomegaly or masses felt. Normal bowel sounds heard. Central nervous system: Alert and oriented. No focal neurological deficits. Extremities: Right shoulder dressing serous drainage noted. Skin: No rashes, lesions or ulcers Psychiatry: Judgement and insight appear normal. Mood & affect appropriate.   Data Reviewed: I have personally reviewed following labs and imaging studies  CBC: Recent Labs  Lab 04/24/20 0600 04/26/20 0635 04/23/2020 0228 04/28/20 0014 04/28/2020 0333  WBC 14.8* 15.0* 16.3* 16.0* 17.6*  HGB 10.8* 10.5* 10.1* 10.2* 10.2*  HCT 32.1* 32.1* 31.4* 30.9* 30.8*  MCV 89.4 91.7 91.3 89.8 90.3  PLT 181 281 347 436* 381*   Basic Metabolic Panel: Recent Labs  Lab 04/23/20 0537 04/23/20 1800 04/08/2020 0500 04/26/20 0958 04/19/2020 0228 04/28/20 0014 04/08/2020 0333  NA 134*   < > 131* 131* 131* 134* 134*  K 2.8*   < > 3.1* 3.5 3.4* 3.9 3.5  CL 103   < > 100 101 102 104 101  CO2 22   < > 22 20* 22 19* 22  GLUCOSE 173*   < > 237* 218* 137* 188* 174*  BUN 25*   < > 26* 22* 17 14 9   CREATININE 1.34*   < > 1.41* 1.25* 1.13 1.38* 1.25*  CALCIUM 7.4*   < > 7.1* 7.0* 7.0* 7.3* 7.5*  MG 2.0  --   --   --  2.2  --   --    < > = values in this interval not displayed.   GFR: Estimated Creatinine Clearance: 99.7 mL/min (A) (by C-G formula based on SCr of 1.25 mg/dL (H)). Liver Function Tests: Recent Labs  Lab 04/23/20 0537 04/15/2020 0500 04/19/2020 0228 04/28/20 0014  AST 41 24 20 24   ALT 32 36 21 20  ALKPHOS 75 91 73 83  BILITOT 1.5* 2.3* 2.1* 1.7*  PROT 4.9* 5.2* 5.3* 5.5*  ALBUMIN 1.7* 1.6* 1.6* 1.3*   No results for input(s): LIPASE, AMYLASE in the last 168 hours. No results for input(s): AMMONIA in the last 168 hours. Coagulation Profile: No results for input(s): INR, PROTIME in the last 168 hours. Cardiac Enzymes: No results for input(s): CKTOTAL, CKMB, CKMBINDEX, TROPONINI in the last 168 hours. BNP (last 3 results) No results for input(s):  PROBNP in the last 8760 hours. HbA1C: No results for input(s): HGBA1C in the last 72 hours. CBG: Recent Labs  Lab 04/28/20 0611 04/28/20 1149 04/28/20 1639 04/28/20 2128 04/28/2020 0616  GLUCAP 190* 205* 186* 165* 166*   Lipid Profile: No results for input(s): CHOL, HDL, LDLCALC, TRIG, CHOLHDL, LDLDIRECT in the last 72 hours. Thyroid Function Tests: No results for input(s): TSH, T4TOTAL, FREET4, T3FREE, THYROIDAB in the last 72 hours. Anemia Panel: No results for input(s): VITAMINB12, FOLATE, FERRITIN, TIBC, IRON, RETICCTPCT in the last 72 hours. Sepsis Labs: Recent Labs  Lab 04/23/20 1701 04/23/20 2015 04/28/2020 2201 04/28/20 0014  LATICACIDVEN 1.3 1.4 1.5 1.4    Recent Results (from the past 240 hour(s))  Blood Culture (routine x 2)     Status: Abnormal   Collection Time: 05/02/2020  2:00 AM   Specimen: BLOOD RIGHT HAND  Result Value Ref Range Status   Specimen Description   Final    BLOOD RIGHT HAND Performed at [facility], [address] [place], [place] [address]    Special Requests   Final    BOTTLES DRAWN AEROBIC AND ANAEROBIC Blood Culture adequate volume Performed at [facility], [address] [place], [place] [address]    Culture  Setup Time   Final    GRAM POSITIVE COCCI IN BOTH AEROBIC AND ANAEROBIC BOTTLES CRITICAL RESULT CALLED TO, READ BACK BY AND VERIFIED WITHSeward Meth [facility] 2109 04/12/2020 A BROWNING Performed at [facility], [place] [address] [place], [place] [address]    Culture STAPHYLOCOCCUS AUREUS (A)  Final   Report Status 04/24/2020 FINAL  Final   Organism ID, Bacteria STAPHYLOCOCCUS AUREUS  Final      Susceptibility   Staphylococcus aureus - MIC*    CIPROFLOXACIN <=0.5 SENSITIVE Sensitive     ERYTHROMYCIN <=0.25 SENSITIVE Sensitive     GENTAMICIN <=0.5 SENSITIVE Sensitive     OXACILLIN 0.5 SENSITIVE Sensitive     TETRACYCLINE <=1 SENSITIVE Sensitive     VANCOMYCIN 1 SENSITIVE Sensitive     TRIMETH/SULFA  <=10 SENSITIVE Sensitive     CLINDAMYCIN <=0.25 SENSITIVE Sensitive     RIFAMPIN <=0.5 SENSITIVE Sensitive     Inducible Clindamycin NEGATIVE Sensitive     * STAPHYLOCOCCUS AUREUS  Blood Culture ID Panel (Reflexed)     Status: Abnormal   Collection Time: 04/29/2020  2:00 AM  Result Value Ref Range Status   Enterococcus faecalis NOT DETECTED NOT DETECTED Final   Enterococcus Faecium NOT DETECTED NOT DETECTED Final   Listeria monocytogenes NOT DETECTED NOT DETECTED Final   Staphylococcus species DETECTED (A) NOT  DETECTED Final    Comment: CRITICAL RESULT CALLED TO, READ BACK BY AND VERIFIED WITH: N GOLGOVAC PHARMD 2109 04/19/2020 A BROWNING    Staphylococcus aureus (BCID) DETECTED (A) NOT DETECTED Final    Comment: CRITICAL RESULT CALLED TO, READ BACK BY AND VERIFIED WITH: N GOLGOVAC PHARMD 2109 05/02/2020 A BROWNING    Staphylococcus epidermidis NOT DETECTED NOT DETECTED Final   Staphylococcus lugdunensis NOT DETECTED NOT DETECTED Final   Streptococcus species NOT DETECTED NOT DETECTED Final   Streptococcus agalactiae NOT DETECTED NOT DETECTED Final   Streptococcus pneumoniae NOT DETECTED NOT DETECTED Final   Streptococcus pyogenes NOT DETECTED NOT DETECTED Final   A.calcoaceticus-baumannii NOT DETECTED NOT DETECTED Final   Bacteroides fragilis NOT DETECTED NOT DETECTED Final   Enterobacterales NOT DETECTED NOT DETECTED Final   Enterobacter cloacae complex NOT DETECTED NOT DETECTED Final   Escherichia coli NOT DETECTED NOT DETECTED Final   Klebsiella aerogenes NOT DETECTED NOT DETECTED Final   Klebsiella oxytoca NOT DETECTED NOT DETECTED Final   Klebsiella pneumoniae NOT DETECTED NOT DETECTED Final   Proteus species NOT DETECTED NOT DETECTED Final   Salmonella species NOT DETECTED NOT DETECTED Final   Serratia marcescens NOT DETECTED NOT DETECTED Final   Haemophilus influenzae NOT DETECTED NOT DETECTED Final   Neisseria meningitidis NOT DETECTED NOT DETECTED Final   Pseudomonas  aeruginosa NOT DETECTED NOT DETECTED Final   Stenotrophomonas maltophilia NOT DETECTED NOT DETECTED Final   Candida albicans NOT DETECTED NOT DETECTED Final   Candida auris NOT DETECTED NOT DETECTED Final   Candida glabrata NOT DETECTED NOT DETECTED Final   Candida krusei NOT DETECTED NOT DETECTED Final   Candida parapsilosis NOT DETECTED NOT DETECTED Final   Candida tropicalis NOT DETECTED NOT DETECTED Final   Cryptococcus neoformans/gattii NOT DETECTED NOT DETECTED Final   Meth resistant mecA/C and MREJ NOT DETECTED NOT DETECTED Final    Comment: Performed at [facility], [address] [place], [place] [address]  Urine culture     Status: Abnormal   Collection Time: 04/25/2020  2:02 AM   Specimen: In/Out Cath Urine  Result Value Ref Range Status   Specimen Description IN/OUT CATH URINE  Final   Special Requests NONE  Final   Culture 40,000 COLONIES/mL STAPHYLOCOCCUS AUREUS (A)  Final   Report Status 04/24/2020 FINAL  Final   Organism ID, Bacteria STAPHYLOCOCCUS AUREUS (A)  Final      Susceptibility   Staphylococcus aureus - MIC*    CIPROFLOXACIN <=0.5 SENSITIVE Sensitive     GENTAMICIN <=0.5 SENSITIVE Sensitive     NITROFURANTOIN 32 SENSITIVE Sensitive     OXACILLIN <=0.25 SENSITIVE Sensitive     TETRACYCLINE <=1 SENSITIVE Sensitive     VANCOMYCIN 1 SENSITIVE Sensitive     TRIMETH/SULFA <=10 SENSITIVE Sensitive     CLINDAMYCIN <=0.25 SENSITIVE Sensitive     RIFAMPIN <=0.5 SENSITIVE Sensitive     Inducible Clindamycin NEGATIVE Sensitive     * 40,000 COLONIES/mL STAPHYLOCOCCUS AUREUS  Resp Panel by RT-PCR (Flu A&B, Covid) Nasopharyngeal Swab     Status: None   Collection Time: 05/03/2020  2:05 AM   Specimen: Nasopharyngeal Swab; Nasopharyngeal(NP) swabs in vial transport medium  Result Value Ref Range Status   SARS Coronavirus 2 by RT PCR NEGATIVE NEGATIVE Final    Comment: (NOTE) SARS-CoV-2 target nucleic acids are NOT DETECTED.  The SARS-CoV-2 RNA is generally  detectable in upper respiratory specimens during the acute phase of infection. The lowest concentration of SARS-CoV-2 viral copies this  assay can detect is 138 copies/mL. A negative result does not preclude SARS-Cov-2 infection and should not be used as the sole basis for treatment or other patient management decisions. A negative result may occur with  improper specimen collection/handling, submission of specimen other than nasopharyngeal swab, presence of viral mutation(s) within the areas targeted by this assay, and inadequate number of viral copies(<138 copies/mL). A negative result must be combined with clinical observations, patient history, and epidemiological information. The expected result is Negative.  Fact Sheet for Patients:  EntrepreneurPulse.com.au  Fact Sheet for Healthcare Providers:  IncredibleEmployment.be  This test is no t yet approved or cleared by the [place] FDA and  has been authorized for detection and/or diagnosis of SARS-CoV-2 by FDA under an Emergency Use Authorization (EUA). This EUA will remain  in effect (meaning this test can be used) for the duration of the COVID-19 declaration under Section 564(b)(1) of the Act, 21 U.S.C.section 360bbb-3(b)(1), unless the authorization is terminated  or revoked sooner.       Influenza A by PCR NEGATIVE NEGATIVE Final   Influenza B by PCR NEGATIVE NEGATIVE Final    Comment: (NOTE) The Xpert Xpress SARS-CoV-2/FLU/RSV plus assay is intended as an aid in the diagnosis of influenza from Nasopharyngeal swab specimens and should not be used as a sole basis for treatment. Nasal washings and aspirates are unacceptable for Xpert Xpress SARS-CoV-2/FLU/RSV testing.  Fact Sheet for Patients: EntrepreneurPulse.com.au  Fact Sheet for Healthcare Providers: IncredibleEmployment.be  This test is not yet approved or cleared by the [place] FDA  and has been authorized for detection and/or diagnosis of SARS-CoV-2 by FDA under an Emergency Use Authorization (EUA). This EUA will remain in effect (meaning this test can be used) for the duration of the COVID-19 declaration under Section 564(b)(1) of the Act, 21 U.S.C. section 360bbb-3(b)(1), unless the authorization is terminated or revoked.  Performed at [facility], [address] [place], [place] [address]   Blood Culture (routine x 2)     Status: Abnormal   Collection Time: 04/27/2020  2:06 AM   Specimen: BLOOD LEFT FOREARM  Result Value Ref Range Status   Specimen Description   Final    BLOOD LEFT FOREARM Performed at [facility], [address] [place], [place] [address]    Special Requests   Final    BOTTLES DRAWN AEROBIC AND ANAEROBIC Blood Culture adequate volume Performed at [facility], [address] [place], [place] [address]    Culture  Setup Time   Final    GRAM POSITIVE COCCI IN BOTH AEROBIC AND ANAEROBIC BOTTLES CRITICAL VALUE NOTED.  VALUE IS CONSISTENT WITH PREVIOUSLY REPORTED AND CALLED VALUE.    Culture (A)  Final    STAPHYLOCOCCUS AUREUS SUSCEPTIBILITIES PERFORMED ON PREVIOUS CULTURE WITHIN THE LAST 5 DAYS. Performed at [facility], [place] [address], [place], [place] [address]    Report Status 04/24/2020 FINAL  Final  MRSA PCR Screening     Status: None   Collection Time: 04/29/2020  6:24 AM   Specimen: Nasopharyngeal  Result Value Ref Range Status   MRSA by PCR NEGATIVE NEGATIVE Final    Comment:        The GeneXpert MRSA Assay (FDA approved for NASAL specimens only), is one component of a comprehensive MRSA colonization surveillance program. It is not intended to diagnose MRSA infection nor to guide or monitor treatment for MRSA infections. Performed at [facility],  [place] [address], [place], [place] [address]   Culture, blood (single)     Status: Abnormal   Collection Time:  04/19/2020  1:53 PM   Specimen: BLOOD RIGHT HAND  Result Value Ref Range Status   Specimen Description   Final    BLOOD RIGHT HAND Performed at [place] [address], [place], [place] [address]    Special Requests   Final    BOTTLES DRAWN AEROBIC AND ANAEROBIC Blood Culture results may not be optimal due to an inadequate volume of blood received in culture bottles Performed at [place] [address] [place], [place] [address]    Culture  Setup Time   Final    ANAEROBIC BOTTLE ONLY GRAM POSITIVE COCCI CRITICAL RESULT CALLED TO, READ BACK BY AND VERIFIED WITH: Concepcion Elk [id number] MLM Performed at [facility], [address], [place], [place] [address]    Culture STAPHYLOCOCCUS AUREUS (A)  Final   Report Status 04/28/2020 FINAL  Final   Organism ID, Bacteria STAPHYLOCOCCUS AUREUS  Final      Susceptibility   Staphylococcus aureus - MIC*    CIPROFLOXACIN <=0.5 SENSITIVE Sensitive     ERYTHROMYCIN <=0.25 SENSITIVE Sensitive     GENTAMICIN <=0.5 SENSITIVE Sensitive     OXACILLIN 0.5 SENSITIVE Sensitive     TETRACYCLINE <=1 SENSITIVE Sensitive     VANCOMYCIN 1 SENSITIVE Sensitive     TRIMETH/SULFA <=10 SENSITIVE Sensitive     CLINDAMYCIN <=0.25 SENSITIVE Sensitive     RIFAMPIN <=0.5 SENSITIVE Sensitive     Inducible Clindamycin NEGATIVE Sensitive     * STAPHYLOCOCCUS AUREUS  Blood Culture ID Panel (Reflexed)     Status: Abnormal   Collection Time: 05/03/2020  1:53 PM  Result Value Ref Range Status   Enterococcus faecalis NOT DETECTED NOT DETECTED Final   Enterococcus Faecium NOT DETECTED NOT DETECTED Final   Listeria monocytogenes NOT DETECTED NOT DETECTED Final   Staphylococcus species DETECTED (A) NOT DETECTED Final    Comment: CRITICAL RESULT CALLED TO, READ BACK BY AND VERIFIED WITH: PHARMD M HICKS [id number] MLM    Staphylococcus aureus (BCID) DETECTED (A) NOT DETECTED Final    Comment: CRITICAL RESULT CALLED TO,  READ BACK BY AND VERIFIED WITH: PHARMD M HICKS [id number] MLM    Staphylococcus epidermidis NOT DETECTED NOT DETECTED Final   Staphylococcus lugdunensis NOT DETECTED NOT DETECTED Final   Streptococcus species NOT DETECTED NOT DETECTED Final   Streptococcus agalactiae NOT DETECTED NOT DETECTED Final   Streptococcus pneumoniae NOT DETECTED NOT DETECTED Final   Streptococcus pyogenes NOT DETECTED NOT DETECTED Final   A.calcoaceticus-baumannii NOT DETECTED NOT DETECTED Final   Bacteroides fragilis NOT DETECTED NOT DETECTED Final   Enterobacterales NOT DETECTED NOT DETECTED Final   Enterobacter cloacae complex NOT DETECTED NOT DETECTED Final   Escherichia coli NOT DETECTED NOT DETECTED Final   Klebsiella aerogenes NOT DETECTED NOT DETECTED Final   Klebsiella oxytoca NOT DETECTED NOT DETECTED Final   Klebsiella pneumoniae NOT DETECTED NOT DETECTED Final   Proteus species NOT DETECTED NOT DETECTED Final   Salmonella species NOT DETECTED NOT DETECTED Final   Serratia marcescens NOT DETECTED NOT DETECTED Final   Haemophilus influenzae NOT DETECTED NOT DETECTED Final   Neisseria meningitidis NOT DETECTED NOT DETECTED Final   Pseudomonas aeruginosa NOT DETECTED NOT DETECTED Final   Stenotrophomonas maltophilia NOT DETECTED NOT DETECTED Final   Candida albicans NOT DETECTED NOT DETECTED Final   Candida auris  NOT DETECTED NOT DETECTED Final   Candida glabrata NOT DETECTED NOT DETECTED Final   Candida krusei NOT DETECTED NOT DETECTED Final   Candida parapsilosis NOT DETECTED NOT DETECTED Final   Candida tropicalis NOT DETECTED NOT DETECTED Final   Cryptococcus neoformans/gattii NOT DETECTED NOT DETECTED Final   Meth resistant mecA/C and MREJ NOT DETECTED NOT DETECTED Final    Comment: Performed at [facility], [place] [address] [place], [place] [address]  Culture, blood (Routine X 2) w Reflex to ID Panel     Status: None (Preliminary result)   Collection Time: 04/24/20  7:52 AM   Specimen:  BLOOD LEFT HAND  Result Value Ref Range Status   Specimen Description   Final    BLOOD LEFT HAND Performed at [facility], [place] [address] [place], [place] [address]    Special Requests   Final    BOTTLES DRAWN AEROBIC AND ANAEROBIC BLOOD LEFT HAND Performed at [place] [address] [place], [place] [address]    Culture   Final    NO GROWTH 4 DAYS Performed at [facility], [place] [address], [place], [place] [address]    Report Status PENDING  Incomplete  Culture, blood (Routine X 2) w Reflex to ID Panel     Status: None (Preliminary result)   Collection Time: 04/24/20  7:52 AM   Specimen: BLOOD LEFT HAND  Result Value Ref Range Status   Specimen Description   Final    BLOOD LEFT HAND Performed at [facility], [place] [address], [place], [place] [address]    Special Requests   Final    BOTTLES DRAWN AEROBIC AND ANAEROBIC BLOOD LEFT HAND Performed at [place] [address] [place], [place] [address]    Culture   Final    NO GROWTH 4 DAYS Performed at [facility], [place] [address], [place], [place] [address]    Report Status PENDING  Incomplete  Aerobic/Anaerobic Culture (surgical/deep wound)     Status: None (Preliminary result)   Collection Time: 04/11/2020  2:48 PM   Specimen: PATH Other; Tissue  Result Value Ref Range Status   Specimen Description   Final    ABSCESS SHOULDER RIGHT Performed at [place] [address], [place], [place] [address]    Special Requests   Final    NONE Performed at [facility], [place] [address], [place], [place] [address]    Gram Stain   Final    FEW WBC PRESENT, PREDOMINANTLY PMN ABUNDANT GRAM POSITIVE COCCI Performed at [facility], [place] [address] [place], [place] [address]    Culture   Final    MODERATE STAPHYLOCOCCUS AUREUS NO ANAEROBES ISOLATED; CULTURE IN PROGRESS FOR 5 DAYS    Report Status PENDING   Incomplete   Organism ID, Bacteria STAPHYLOCOCCUS AUREUS  Final      Susceptibility   Staphylococcus aureus - MIC*    CIPROFLOXACIN <=0.5 SENSITIVE Sensitive     ERYTHROMYCIN <=0.25 SENSITIVE Sensitive     GENTAMICIN <=0.5 SENSITIVE Sensitive     OXACILLIN 0.5 SENSITIVE Sensitive     TETRACYCLINE <=1 SENSITIVE Sensitive     VANCOMYCIN 1 SENSITIVE Sensitive     TRIMETH/SULFA <=10 SENSITIVE Sensitive     CLINDAMYCIN <=0.25 SENSITIVE Sensitive     RIFAMPIN <=0.5 SENSITIVE Sensitive     Inducible Clindamycin NEGATIVE Sensitive     * MODERATE STAPHYLOCOCCUS AUREUS  Aerobic/Anaerobic Culture (surgical/deep wound)     Status: None (Preliminary result)   Collection Time: 04/13/2020  2:58 PM   Specimen: PATH Other; Tissue  Result Value Ref Range Status   Specimen Description   Final    TISSUE SHOULDER RIGHT Performed at [place] [address], [place], [place] [address]    Special Requests   Final    NONE Performed at [facility], [place] [address], [place], [place] [id number]    Gram Stain   Final    ABUNDANT WBC PRESENT, PREDOMINANTLY PMN ABUNDANT GRAM POSITIVE COCCI Performed at [facility], [place] [address], [place], [place] [address]    Culture   Final    ABUNDANT STAPHYLOCOCCUS AUREUS NO ANAEROBES ISOLATED; CULTURE IN PROGRESS FOR 5 DAYS    Report Status PENDING  Incomplete   Organism ID, Bacteria STAPHYLOCOCCUS AUREUS  Final      Susceptibility   Staphylococcus aureus - MIC*    CIPROFLOXACIN <=0.5 SENSITIVE Sensitive     ERYTHROMYCIN <=0.25 SENSITIVE Sensitive     GENTAMICIN <=0.5 SENSITIVE Sensitive     OXACILLIN 0.5 SENSITIVE Sensitive     TETRACYCLINE <=1 SENSITIVE Sensitive     VANCOMYCIN <=0.5 SENSITIVE Sensitive     TRIMETH/SULFA <=10 SENSITIVE Sensitive     CLINDAMYCIN <=0.25 SENSITIVE Sensitive     RIFAMPIN <=0.5 SENSITIVE Sensitive     Inducible Clindamycin NEGATIVE Sensitive     * ABUNDANT STAPHYLOCOCCUS AUREUS    Culture, blood (Routine X 2) w Reflex to ID Panel     Status: None (Preliminary result)   Collection Time: 04/26/20  9:58 AM   Specimen: BLOOD RIGHT HAND  Result Value Ref Range Status   Specimen Description   Final    BLOOD RIGHT HAND Performed at [facility] [address], [place], [place] [address]    Special Requests   Final    BOTTLES DRAWN AEROBIC ONLY Blood Culture results may not be optimal due to an inadequate volume of blood received in culture bottles Performed at [facility] [address], [place], [place] [address]    Culture   Final    NO GROWTH 2 DAYS Performed at [facility] [address] [place], [place] [address]    Report Status PENDING  Incomplete  Culture, blood (Routine X 2) w Reflex to ID Panel     Status: None (Preliminary result)   Collection Time: 04/26/20  9:58 AM   Specimen: BLOOD RIGHT HAND  Result Value Ref Range Status   Specimen Description   Final    BLOOD RIGHT HAND Performed at [facility] [address] [place], [place] [address]    Special Requests   Final    BOTTLES DRAWN AEROBIC ONLY Blood Culture results may not be optimal due to an inadequate volume of blood received in culture bottles Performed at [facility] [address], [place], [place] [address]    Culture   Final    NO GROWTH 2 DAYS Performed at [facility] [address] [place], [place] [address]    Report Status PENDING  Incomplete      Radiology Studies: CARDIAC CATHETERIZATION  Result Date: 04/25/2020  Prox Cx lesion is 25% stenosed.  Mid LAD lesion is 10% stenosed.  LV end diastolic pressure is moderately elevated. LVEDP 29 mm Hg.  There is no aortic valve stenosis.  Hemodynamic findings consistent with mild  pulmonary hypertension.  Ao sat 98%, PA sat 68%, PA pressure 41/20, mean PA 31 mm Hg; mean PCWP 28 mm Hg; CO 8.1 L/min; CI 3.2  Nonobstructive CAD.  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 Continue with plans for mitral valve intervention with Dr. Orvan Seen.   ECHO TEE  Result Date: 04/25/2020    TRANSESOPHOGEAL ECHO REPORT   Patient Name:   HADYN BLANCK Date of Exam: 04/28/2020 Medical Rec #:  [id number]      Height:       75.0 in Accession #:    [id number]     Weight:       265.0 lb Date of Birth:  06/29/1965     BSA:          2.473 m Patient Age:    55 years       BP:           146/114 mmHg Patient Gender: M              HR:           104 bpm. Exam Location:  Inpatient Procedure: Transesophageal Echo Indications:    Bacteremia 790.7 / R78.81  History:        Patient has prior history of Echocardiogram examinations, most                 recent 04/11/2020. Risk Factors:Diabetes.  Sonographer:    Bernadene Person RDCS Referring Phys: [id number] CADENCE H FURTH PROCEDURE: After discussion of the risks and benefits of a TEE, an informed consent was obtained from the patient. The transesophogeal probe was passed without difficulty through the esophogus of the patient. Local oropharyngeal anesthetic was provided with Cetacaine. Sedation performed by different physician. The patient was monitored while under deep sedation. Anesthestetic sedation was provided intravenously by Anesthesiology: 400.5mg  of Propofol, 100mg  of Lidocaine. The patient's vital signs; including heart rate, blood pressure, and oxygen saturation; remained stable throughout the procedure. The patient developed no complications during the procedure. IMPRESSIONS  1. There is a large echodensity that is partially fixed (below the valve on the ventricular side and possibly representing an abscess) and partially hypermobile, multilobar ( on the left atrial side) consistent with a vegetation with high risk for embolization. This involves P2,3 and A3 leaflets. Vegetation size is 4.4 x 2.4 cm.  2. Left ventricular ejection fraction, by estimation, is 55 to 60%. The left ventricle has normal function. The  left ventricle has no regional wall motion abnormalities. Left ventricular diastolic function could not be evaluated.  3. Right ventricular systolic function is normal. The right ventricular size is normal.  4. No left atrial/left atrial appendage thrombus was detected.  5. The mitral valve is normal in structure. Mild mitral valve regurgitation. No evidence of mitral stenosis.  6. The aortic valve is normal in structure. Aortic valve regurgitation is not visualized. No aortic stenosis is present.  7. The inferior vena cava is normal in size with greater than 50% respiratory variability, suggesting right atrial pressure of 3 mmHg. Conclusion(s)/Recommendation(s): Normal biventricular function without evidence of hemodynamically significant valvular heart disease. CT surgery and hospitalist Dr Avon Gully were notified at 1:10 pm.The patient will be transferred [facility] the [facility].  FINDINGS  Left Ventricle: Left ventricular ejection fraction, by estimation, is 55 to 60%. The left ventricle has normal function. The left ventricle has no regional wall motion abnormalities. The left ventricular internal cavity size was normal in size. There is  no left ventricular hypertrophy. Left  ventricular diastolic function could not be evaluated. Right Ventricle: The right ventricular size is normal. No increase in right ventricular wall thickness. Right ventricular systolic function is normal. Left Atrium: Left atrial size was normal in size. No left atrial/left atrial appendage thrombus was detected. Right Atrium: Right atrial size was normal in size. Pericardium: There is no evidence of pericardial effusion. Mitral Valve: There is a large echodensity that is partially fixed (below the valve on the ventricular side and possibly representing an abscess) and partially hypermobile, multilobar ( on the left atrial side) consistent with a vegetation with high risk  for embolization. This involves P2,3 and A3 leaflets. The mitral valve is  normal in structure. Mild mitral valve regurgitation. No evidence of mitral valve stenosis. Tricuspid Valve: The tricuspid valve is normal in structure. Tricuspid valve regurgitation is mild . No evidence of tricuspid stenosis. There is no evidence of tricuspid valve vegetation. Aortic Valve: The aortic valve is normal in structure. Aortic valve regurgitation is not visualized. No aortic stenosis is present. There is no evidence of aortic valve vegetation. Pulmonic Valve: The pulmonic valve was normal in structure. Pulmonic valve regurgitation is not visualized. No evidence of pulmonic stenosis. There is no evidence of pulmonic valve vegetation. Aorta: The aortic root is normal in size and structure. There is minimal (Grade I) plaque. Venous: The inferior vena cava is normal in size with greater than 50% respiratory variability, suggesting right atrial pressure of 3 mmHg. IAS/Shunts: The interatrial septum appears to be lipomatous. No atrial level shunt detected by color flow Doppler. There is no evidence of an atrial septal defect. [name] MD Electronically signed by [name] MD Signature Date/Time: 04/14/2020/1:29:48 PM    Final     Scheduled Meds: . chlorhexidine  15 mL Mouth Rinse BID  . Chlorhexidine Gluconate Cloth  6 each Topical Daily  . enoxaparin (LOVENOX) injection  60 mg Subcutaneous Q24H  . insulin aspart  2-6 Units Subcutaneous TID WC  . insulin aspart  7 Units Subcutaneous TID WC  . insulin glargine  30 Units Subcutaneous Daily  . mouth rinse  15 mL Mouth Rinse q12n4p  . sodium chloride flush  10-40 mL Intracatheter Q12H  . sodium chloride flush  3 mL Intravenous Q12H   Continuous Infusions: . sodium chloride    . lactated ringers 125 mL/hr at 04/23/2020 0630  . nafcillin IV 2 g (04/19/2020 0905)     LOS: 7 days   Time spent: 45 minutes   Deshone Lyssy Loann Quill, MD [facility]  If 7PM-7AM, please contact night-coverage [email or web address] 04/19/2020, 9:51 AM

## 2020-04-29 NOTE — Progress Notes (Signed)
   04/27/2020 1656  Assess: MEWS Score  Temp 98.4 F (36.9 C)  BP (!) 168/95  Pulse Rate (!) 118  Resp 18  SpO2 94 %  O2 Device Room Air  Assess: MEWS Score  MEWS Temp 0  MEWS Systolic 0  MEWS Pulse 2  MEWS RR 0  MEWS LOC 0  MEWS Score 2  MEWS Score Color Yellow  Assess: if the MEWS score is Yellow or Red  Were vital signs taken at a resting state? Yes  Focused Assessment Change from prior assessment (see assessment flowsheet)  Early Detection of Sepsis Score *See Row Information* High  MEWS guidelines implemented *See Row Information* No, previously yellow, continue vital signs every 4 hours  Treat  MEWS Interventions Administered prn meds/treatments;Administered scheduled meds/treatments  Pain Scale 0-10  Pain Score 0  Take Vital Signs  Increase Vital Sign Frequency  Yellow: Q 2hr X 2 then Q 4hr X 2, if remains yellow, continue Q 4hrs  Escalate  MEWS: Escalate Yellow: discuss with charge nurse/RN and consider discussing with provider and RRT  Notify: Charge Nurse/RN  Name of Charge Nurse/RN Notified Regulatory affairs officer  Date Charge Nurse/RN Notified 04/28/2020  Time Charge Nurse/RN Notified 1730  Notify: Provider  Provider Name/Title N/A  Notify: Rapid Response  Name of Rapid Response RN Notified N/A  Document  Patient Outcome Not stable and remains on department  Progress note created (see row info) Yes

## 2020-04-29 NOTE — Progress Notes (Signed)
Jonathan Shaffer is doing well this morning.  He had a cardiac cath this morning and had TEE yesterday which showed a large vegetation.  From a shoulder pain standpoint he is doing better.  To feel soreness.  The surgical incision is draining as expected.  Surgical incision is intact.  Penrose is draining well.  Cellulitis is resolved.  He is able to lift his arm without much difficulty or pain.  His left wrist also is a lot less symptomatic.  He has a lot less swelling and is able to move his wrist without any significant pain.  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  Penrose drain removed today.  Dry dressing was applied by the nurse.  IV antibiotics per infectious disease team.  Overall patient is improving from a orthopedic standpoint.  He is scheduled for heart valve replacement in the next couple days per wife report.  Patient can mobilize with PT and OT when able.  Patient will need to follow-up in the office in a couple weeks for suture removal.  All questions answered.

## 2020-04-30 ENCOUNTER — Encounter (HOSPITAL_COMMUNITY): Admission: EM | Disposition: E | Payer: Self-pay | Source: Home / Self Care | Attending: Cardiothoracic Surgery

## 2020-04-30 ENCOUNTER — Inpatient Hospital Stay (HOSPITAL_COMMUNITY): Payer: BC Managed Care – PPO

## 2020-04-30 ENCOUNTER — Inpatient Hospital Stay (HOSPITAL_COMMUNITY): Payer: BC Managed Care – PPO | Admitting: Certified Registered"

## 2020-04-30 ENCOUNTER — Encounter (HOSPITAL_COMMUNITY): Payer: Self-pay | Admitting: Family Medicine

## 2020-04-30 DIAGNOSIS — L02413 Cutaneous abscess of right upper limb: Secondary | ICD-10-CM | POA: Diagnosis not present

## 2020-04-30 DIAGNOSIS — I340 Nonrheumatic mitral (valve) insufficiency: Secondary | ICD-10-CM | POA: Diagnosis not present

## 2020-04-30 HISTORY — PX: MITRAL VALVE REPLACEMENT: SHX147

## 2020-04-30 HISTORY — PX: TEE WITHOUT CARDIOVERSION: SHX5443

## 2020-04-30 LAB — POCT I-STAT 7, (LYTES, BLD GAS, ICA,H+H)
Acid-Base Excess: 1 mmol/L (ref 0.0–2.0)
Acid-Base Excess: 4 mmol/L — ABNORMAL HIGH (ref 0.0–2.0)
Acid-Base Excess: 4 mmol/L — ABNORMAL HIGH (ref 0.0–2.0)
Acid-Base Excess: 4 mmol/L — ABNORMAL HIGH (ref 0.0–2.0)
Acid-Base Excess: 1 mmol/L (ref 0.0–2.0)
Acid-Base Excess: 2 mmol/L (ref 0.0–2.0)
Acid-base deficit: 5 mmol/L — ABNORMAL HIGH (ref 0.0–2.0)
Acid-base deficit: 5 mmol/L — ABNORMAL HIGH (ref 0.0–2.0)
Bicarbonate: 27.3 mmol/L (ref 20.0–28.0)
Bicarbonate: 28.5 mmol/L — ABNORMAL HIGH (ref 20.0–28.0)
Bicarbonate: 29.5 mmol/L — ABNORMAL HIGH (ref 20.0–28.0)
Bicarbonate: 29.4 mmol/L — ABNORMAL HIGH (ref 20.0–28.0)
Bicarbonate: 25.6 mmol/L (ref 20.0–28.0)
Bicarbonate: 26.7 mmol/L (ref 20.0–28.0)
Bicarbonate: 19.4 mmol/L — ABNORMAL LOW (ref 20.0–28.0)
Bicarbonate: 18.8 mmol/L — ABNORMAL LOW (ref 20.0–28.0)
Calcium, Ion: 1.15 mmol/L (ref 1.15–1.40)
Calcium, Ion: 1.01 mmol/L — ABNORMAL LOW (ref 1.15–1.40)
Calcium, Ion: 1.07 mmol/L — ABNORMAL LOW (ref 1.15–1.40)
Calcium, Ion: 1.08 mmol/L — ABNORMAL LOW (ref 1.15–1.40)
Calcium, Ion: 1.16 mmol/L (ref 1.15–1.40)
Calcium, Ion: 1.08 mmol/L — ABNORMAL LOW (ref 1.15–1.40)
Calcium, Ion: 1.06 mmol/L — ABNORMAL LOW (ref 1.15–1.40)
Calcium, Ion: 1.06 mmol/L — ABNORMAL LOW (ref 1.15–1.40)
HCT: 30 % — ABNORMAL LOW (ref 39.0–52.0)
HCT: 25 % — ABNORMAL LOW (ref 39.0–52.0)
HCT: 23 % — ABNORMAL LOW (ref 39.0–52.0)
HCT: 24 % — ABNORMAL LOW (ref 39.0–52.0)
HCT: 23 % — ABNORMAL LOW (ref 39.0–52.0)
HCT: 23 % — ABNORMAL LOW (ref 39.0–52.0)
HCT: 23 % — ABNORMAL LOW (ref 39.0–52.0)
HCT: 21 % — ABNORMAL LOW (ref 39.0–52.0)
Hemoglobin: 10.2 g/dL — ABNORMAL LOW (ref 13.0–17.0)
Hemoglobin: 8.5 g/dL — ABNORMAL LOW (ref 13.0–17.0)
Hemoglobin: 7.8 g/dL — ABNORMAL LOW (ref 13.0–17.0)
Hemoglobin: 8.2 g/dL — ABNORMAL LOW (ref 13.0–17.0)
Hemoglobin: 7.8 g/dL — ABNORMAL LOW (ref 13.0–17.0)
Hemoglobin: 7.8 g/dL — ABNORMAL LOW (ref 13.0–17.0)
Hemoglobin: 7.8 g/dL — ABNORMAL LOW (ref 13.0–17.0)
Hemoglobin: 7.1 g/dL — ABNORMAL LOW (ref 13.0–17.0)
O2 Saturation: 98 %
O2 Saturation: 100 %
O2 Saturation: 100 %
O2 Saturation: 100 %
O2 Saturation: 100 %
O2 Saturation: 100 %
O2 Saturation: 99 %
O2 Saturation: 97 %
Patient temperature: 37.1
Patient temperature: 37.4
Patient temperature: 37.4
Potassium: 3.9 mmol/L (ref 3.5–5.1)
Potassium: 3.8 mmol/L (ref 3.5–5.1)
Potassium: 4.1 mmol/L (ref 3.5–5.1)
Potassium: 4.9 mmol/L (ref 3.5–5.1)
Potassium: 4.8 mmol/L (ref 3.5–5.1)
Potassium: 4.9 mmol/L (ref 3.5–5.1)
Potassium: 5 mmol/L (ref 3.5–5.1)
Potassium: 5 mmol/L (ref 3.5–5.1)
Sodium: 138 mmol/L (ref 135–145)
Sodium: 138 mmol/L (ref 135–145)
Sodium: 137 mmol/L (ref 135–145)
Sodium: 137 mmol/L (ref 135–145)
Sodium: 139 mmol/L (ref 135–145)
Sodium: 139 mmol/L (ref 135–145)
Sodium: 139 mmol/L (ref 135–145)
Sodium: 138 mmol/L (ref 135–145)
TCO2: 29 mmol/L (ref 22–32)
TCO2: 30 mmol/L (ref 22–32)
TCO2: 31 mmol/L (ref 22–32)
TCO2: 31 mmol/L (ref 22–32)
TCO2: 27 mmol/L (ref 22–32)
TCO2: 28 mmol/L (ref 22–32)
TCO2: 20 mmol/L — ABNORMAL LOW (ref 22–32)
TCO2: 20 mmol/L — ABNORMAL LOW (ref 22–32)
pCO2 arterial: 51.7 mmHg — ABNORMAL HIGH (ref 32.0–48.0)
pCO2 arterial: 43.8 mmHg (ref 32.0–48.0)
pCO2 arterial: 51.7 mmHg — ABNORMAL HIGH (ref 32.0–48.0)
pCO2 arterial: 46.8 mmHg (ref 32.0–48.0)
pCO2 arterial: 41.9 mmHg (ref 32.0–48.0)
pCO2 arterial: 42.5 mmHg (ref 32.0–48.0)
pCO2 arterial: 30.3 mmHg — ABNORMAL LOW (ref 32.0–48.0)
pCO2 arterial: 29.2 mmHg — ABNORMAL LOW (ref 32.0–48.0)
pH, Arterial: 7.331 — ABNORMAL LOW (ref 7.350–7.450)
pH, Arterial: 7.422 (ref 7.350–7.450)
pH, Arterial: 7.365 (ref 7.350–7.450)
pH, Arterial: 7.407 (ref 7.350–7.450)
pH, Arterial: 7.394 (ref 7.350–7.450)
pH, Arterial: 7.407 (ref 7.350–7.450)
pH, Arterial: 7.415 (ref 7.350–7.450)
pH, Arterial: 7.419 (ref 7.350–7.450)
pO2, Arterial: 121 mmHg — ABNORMAL HIGH (ref 83.0–108.0)
pO2, Arterial: 373 mmHg — ABNORMAL HIGH (ref 83.0–108.0)
pO2, Arterial: 332 mmHg — ABNORMAL HIGH (ref 83.0–108.0)
pO2, Arterial: 432 mmHg — ABNORMAL HIGH (ref 83.0–108.0)
pO2, Arterial: 246 mmHg — ABNORMAL HIGH (ref 83.0–108.0)
pO2, Arterial: 380 mmHg — ABNORMAL HIGH (ref 83.0–108.0)
pO2, Arterial: 142 mmHg — ABNORMAL HIGH (ref 83.0–108.0)
pO2, Arterial: 94 mmHg (ref 83.0–108.0)

## 2020-04-30 LAB — LIPID PANEL
Cholesterol: 163 mg/dL (ref 0–200)
HDL: 15 mg/dL — ABNORMAL LOW (ref >40)
LDL Cholesterol: 116 mg/dL — ABNORMAL HIGH (ref 0–99)
Total CHOL/HDL Ratio: 10.9 RATIO
Triglycerides: 162 mg/dL — ABNORMAL HIGH (ref <150)
VLDL: 32 mg/dL (ref 0–40)

## 2020-04-30 LAB — POCT I-STAT, CHEM 8
BUN: 8 mg/dL (ref 6–20)
BUN: 9 mg/dL (ref 6–20)
BUN: 9 mg/dL (ref 6–20)
BUN: 9 mg/dL (ref 6–20)
BUN: 10 mg/dL (ref 6–20)
Calcium, Ion: 1.16 mmol/L (ref 1.15–1.40)
Calcium, Ion: 1.1 mmol/L — ABNORMAL LOW (ref 1.15–1.40)
Calcium, Ion: 1.07 mmol/L — ABNORMAL LOW (ref 1.15–1.40)
Calcium, Ion: 1.1 mmol/L — ABNORMAL LOW (ref 1.15–1.40)
Calcium, Ion: 1.16 mmol/L (ref 1.15–1.40)
Chloride: 100 mmol/L (ref 98–111)
Chloride: 100 mmol/L (ref 98–111)
Chloride: 101 mmol/L (ref 98–111)
Chloride: 102 mmol/L (ref 98–111)
Chloride: 104 mmol/L (ref 98–111)
Creatinine, Ser: 0.9 mg/dL (ref 0.61–1.24)
Creatinine, Ser: 1 mg/dL (ref 0.61–1.24)
Creatinine, Ser: 0.9 mg/dL (ref 0.61–1.24)
Creatinine, Ser: 0.9 mg/dL (ref 0.61–1.24)
Creatinine, Ser: 1 mg/dL (ref 0.61–1.24)
Glucose, Bld: 161 mg/dL — ABNORMAL HIGH (ref 70–99)
Glucose, Bld: 156 mg/dL — ABNORMAL HIGH (ref 70–99)
Glucose, Bld: 136 mg/dL — ABNORMAL HIGH (ref 70–99)
Glucose, Bld: 125 mg/dL — ABNORMAL HIGH (ref 70–99)
Glucose, Bld: 128 mg/dL — ABNORMAL HIGH (ref 70–99)
HCT: 31 % — ABNORMAL LOW (ref 39.0–52.0)
HCT: 27 % — ABNORMAL LOW (ref 39.0–52.0)
HCT: 25 % — ABNORMAL LOW (ref 39.0–52.0)
HCT: 26 % — ABNORMAL LOW (ref 39.0–52.0)
HCT: 22 % — ABNORMAL LOW (ref 39.0–52.0)
Hemoglobin: 10.5 g/dL — ABNORMAL LOW (ref 13.0–17.0)
Hemoglobin: 9.2 g/dL — ABNORMAL LOW (ref 13.0–17.0)
Hemoglobin: 8.5 g/dL — ABNORMAL LOW (ref 13.0–17.0)
Hemoglobin: 8.8 g/dL — ABNORMAL LOW (ref 13.0–17.0)
Hemoglobin: 7.5 g/dL — ABNORMAL LOW (ref 13.0–17.0)
Potassium: 3.9 mmol/L (ref 3.5–5.1)
Potassium: 3.7 mmol/L (ref 3.5–5.1)
Potassium: 4.2 mmol/L (ref 3.5–5.1)
Potassium: 4.1 mmol/L (ref 3.5–5.1)
Potassium: 5 mmol/L (ref 3.5–5.1)
Sodium: 138 mmol/L (ref 135–145)
Sodium: 137 mmol/L (ref 135–145)
Sodium: 138 mmol/L (ref 135–145)
Sodium: 137 mmol/L (ref 135–145)
Sodium: 138 mmol/L (ref 135–145)
TCO2: 25 mmol/L (ref 22–32)
TCO2: 23 mmol/L (ref 22–32)
TCO2: 26 mmol/L (ref 22–32)
TCO2: 26 mmol/L (ref 22–32)
TCO2: 24 mmol/L (ref 22–32)

## 2020-04-30 LAB — COMPREHENSIVE METABOLIC PANEL
ALT: 14 U/L (ref 0–44)
AST: 22 U/L (ref 15–41)
Albumin: 1.3 g/dL — ABNORMAL LOW (ref 3.5–5.0)
Alkaline Phosphatase: 85 U/L (ref 38–126)
Anion gap: 10 (ref 5–15)
BUN: 9 mg/dL (ref 6–20)
CO2: 22 mmol/L (ref 22–32)
Calcium: 7.5 mg/dL — ABNORMAL LOW (ref 8.9–10.3)
Chloride: 103 mmol/L (ref 98–111)
Creatinine, Ser: 1.2 mg/dL (ref 0.61–1.24)
GFR, Estimated: >60 mL/min (ref >60)
Glucose, Bld: 88 mg/dL (ref 70–99)
Potassium: 3.5 mmol/L (ref 3.5–5.1)
Sodium: 135 mmol/L (ref 135–145)
Total Bilirubin: 1.4 mg/dL — ABNORMAL HIGH (ref 0.3–1.2)
Total Protein: 5.7 g/dL — ABNORMAL LOW (ref 6.5–8.1)

## 2020-04-30 LAB — CBC WITH DIFFERENTIAL/PLATELET
Abs Immature Granulocytes: 0 10*3/uL (ref 0.00–0.07)
Basophils Absolute: 0 10*3/uL (ref 0.0–0.1)
Basophils Relative: 0 %
Eosinophils Absolute: 0 10*3/uL (ref 0.0–0.5)
Eosinophils Relative: 0 %
HCT: 33 % — ABNORMAL LOW (ref 39.0–52.0)
Hemoglobin: 10.3 g/dL — ABNORMAL LOW (ref 13.0–17.0)
Lymphocytes Relative: 7 %
Lymphs Abs: 1.3 10*3/uL (ref 0.7–4.0)
MCH: 29.5 pg (ref 26.0–34.0)
MCHC: 31.2 g/dL (ref 30.0–36.0)
MCV: 94.6 fL (ref 80.0–100.0)
Monocytes Absolute: 0.4 10*3/uL (ref 0.1–1.0)
Monocytes Relative: 2 %
Neutro Abs: 17.5 10*3/uL — ABNORMAL HIGH (ref 1.7–7.7)
Neutrophils Relative %: 91 %
Platelets: 640 10*3/uL — ABNORMAL HIGH (ref 150–400)
RBC: 3.49 MIL/uL — ABNORMAL LOW (ref 4.22–5.81)
RDW: 16.1 % — ABNORMAL HIGH (ref 11.5–15.5)
WBC: 19.2 10*3/uL — ABNORMAL HIGH (ref 4.0–10.5)
nRBC: 0 % (ref 0.0–0.2)
nRBC: 0 /100 WBC

## 2020-04-30 LAB — URINALYSIS, ROUTINE W REFLEX MICROSCOPIC
Bacteria, UA: NONE SEEN
Bilirubin Urine: NEGATIVE
Glucose, UA: NEGATIVE mg/dL
Ketones, ur: NEGATIVE mg/dL
Nitrite: NEGATIVE
Protein, ur: NEGATIVE mg/dL
Specific Gravity, Urine: 1.012 (ref 1.005–1.030)
pH: 5 (ref 5.0–8.0)

## 2020-04-30 LAB — HEMOGLOBIN A1C
Hgb A1c MFr Bld: 10.8 % — ABNORMAL HIGH (ref 4.8–5.6)
Mean Plasma Glucose: 263.26 mg/dL

## 2020-04-30 LAB — CBC
HCT: 22 % — ABNORMAL LOW (ref 39.0–52.0)
HCT: 24.6 % — ABNORMAL LOW (ref 39.0–52.0)
Hemoglobin: 6.8 g/dL — CL (ref 13.0–17.0)
Hemoglobin: 8 g/dL — ABNORMAL LOW (ref 13.0–17.0)
MCH: 29.7 pg (ref 26.0–34.0)
MCH: 30.5 pg (ref 26.0–34.0)
MCHC: 30.9 g/dL (ref 30.0–36.0)
MCHC: 32.5 g/dL (ref 30.0–36.0)
MCV: 96.1 fL (ref 80.0–100.0)
MCV: 93.9 fL (ref 80.0–100.0)
Platelets: 389 10*3/uL (ref 150–400)
Platelets: 314 10*3/uL (ref 150–400)
RBC: 2.29 MIL/uL — ABNORMAL LOW (ref 4.22–5.81)
RBC: 2.62 MIL/uL — ABNORMAL LOW (ref 4.22–5.81)
RDW: 16.1 % — ABNORMAL HIGH (ref 11.5–15.5)
RDW: 15.2 % (ref 11.5–15.5)
WBC: 35.4 10*3/uL — ABNORMAL HIGH (ref 4.0–10.5)
WBC: 30 10*3/uL — ABNORMAL HIGH (ref 4.0–10.5)
nRBC: 0 % (ref 0.0–0.2)
nRBC: 0 % (ref 0.0–0.2)

## 2020-04-30 LAB — HEMOGLOBIN AND HEMATOCRIT, BLOOD
HCT: 23 % — ABNORMAL LOW (ref 39.0–52.0)
Hemoglobin: 7.3 g/dL — ABNORMAL LOW (ref 13.0–17.0)

## 2020-04-30 LAB — POCT I-STAT EG7
Acid-Base Excess: 2 mmol/L (ref 0.0–2.0)
Bicarbonate: 27.6 mmol/L (ref 20.0–28.0)
Calcium, Ion: 1.06 mmol/L — ABNORMAL LOW (ref 1.15–1.40)
HCT: 25 % — ABNORMAL LOW (ref 39.0–52.0)
Hemoglobin: 8.5 g/dL — ABNORMAL LOW (ref 13.0–17.0)
O2 Saturation: 62 %
Potassium: 3.7 mmol/L (ref 3.5–5.1)
Sodium: 139 mmol/L (ref 135–145)
TCO2: 29 mmol/L (ref 22–32)
pCO2, Ven: 46.9 mmHg (ref 44.0–60.0)
pH, Ven: 7.379 (ref 7.250–7.430)
pO2, Ven: 33 mmHg (ref 32.0–45.0)

## 2020-04-30 LAB — GLUCOSE, CAPILLARY
Glucose-Capillary: 85 mg/dL (ref 70–99)
Glucose-Capillary: 146 mg/dL — ABNORMAL HIGH (ref 70–99)
Glucose-Capillary: 67 mg/dL — ABNORMAL LOW (ref 70–99)
Glucose-Capillary: 115 mg/dL — ABNORMAL HIGH (ref 70–99)
Glucose-Capillary: 97 mg/dL (ref 70–99)
Glucose-Capillary: 98 mg/dL (ref 70–99)
Glucose-Capillary: 109 mg/dL — ABNORMAL HIGH (ref 70–99)
Glucose-Capillary: 141 mg/dL — ABNORMAL HIGH (ref 70–99)
Glucose-Capillary: 128 mg/dL — ABNORMAL HIGH (ref 70–99)

## 2020-04-30 LAB — PLATELET COUNT: Platelets: 379 10*3/uL (ref 150–400)

## 2020-04-30 LAB — PROTIME-INR
INR: 1.2 (ref 0.8–1.2)
INR: 1.7 — ABNORMAL HIGH (ref 0.8–1.2)
Prothrombin Time: 14.4 seconds (ref 11.4–15.2)
Prothrombin Time: 18.9 seconds — ABNORMAL HIGH (ref 11.4–15.2)

## 2020-04-30 LAB — SURGICAL PCR SCREEN
MRSA, PCR: NEGATIVE
Staphylococcus aureus: POSITIVE — AB

## 2020-04-30 LAB — APTT
aPTT: 29 seconds (ref 24–36)
aPTT: 37 seconds — ABNORMAL HIGH (ref 24–36)

## 2020-04-30 LAB — PREPARE RBC (CROSSMATCH)

## 2020-04-30 LAB — FIBRINOGEN: Fibrinogen: 374 mg/dL (ref 210–475)

## 2020-04-30 IMAGING — DX DG CHEST 1V PORT
1 series · 1 of 1 positions shown · non-contrast
Comparison: [DATE]

CLINICAL DATA: Status post mitral valve repair.

EXAM:
PORTABLE CHEST 1 VIEW

[chest ap]
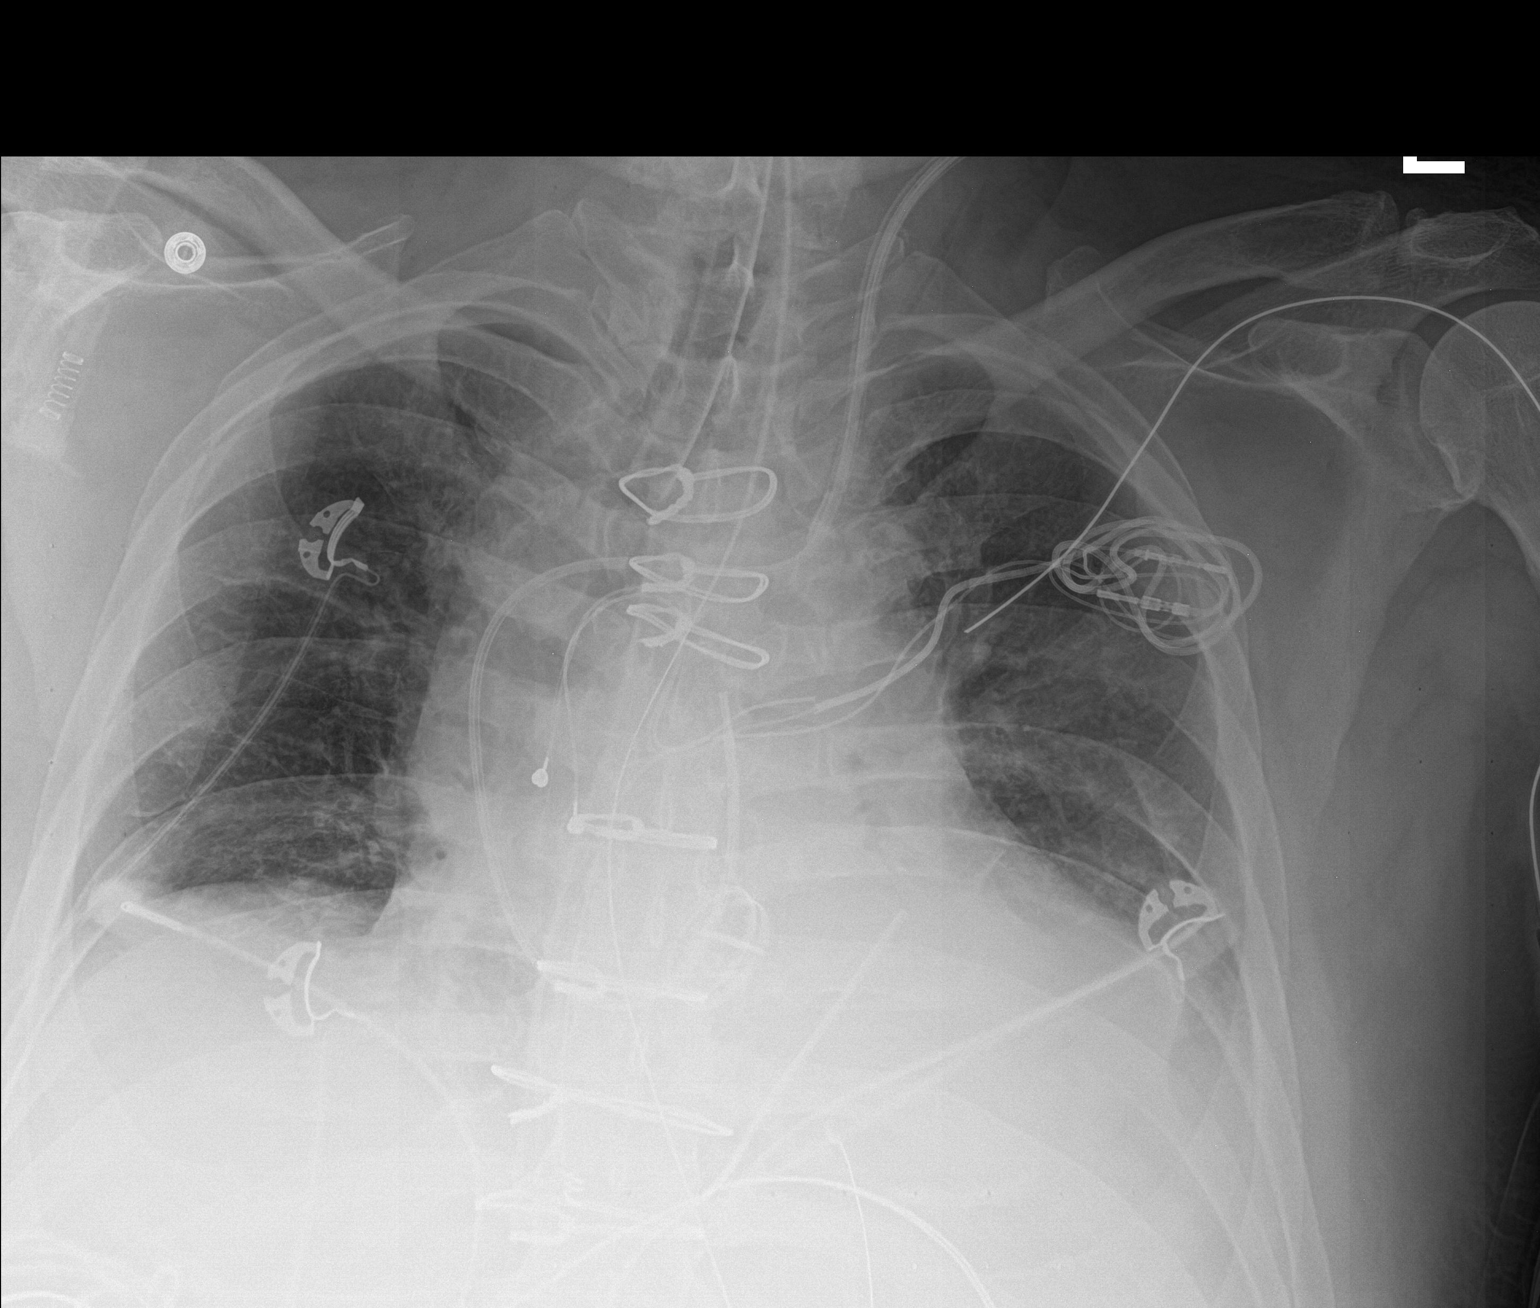

[1 of 1 positions shown; findings below may reference images not displayed]

FINDINGS: Post intubation, satisfactory position of the endotracheal tube.

Swan-Ganz catheter terminates at the expected location of the
pulmonary outflow tract. Artificial mitral valve noted. Mediastinal
and chest drains in place. Enteric catheter in place.

Cardiomediastinal silhouette is normal. Mediastinal contours appear
intact.

There is no evidence of focal airspace consolidation, pleural
effusion or pneumothorax. Low lung volumes.

Osseous structures are without acute abnormality. Soft tissues are
grossly normal.
IMPRESSION: 1. Post intubation, satisfactory position of the endotracheal tube.
2. Low lung volumes.

## 2020-04-30 SURGERY — REPLACEMENT, MITRAL VALVE
Anesthesia: General | Site: Chest

## 2020-04-30 MED ORDER — SODIUM CHLORIDE 0.45 % IV SOLN: INTRAVENOUS | Status: DC | PRN

## 2020-04-30 MED ORDER — SODIUM CHLORIDE 0.9% IV SOLUTION: Freq: Once | INTRAVENOUS | Status: DC

## 2020-04-30 MED ORDER — PHENYLEPHRINE HCL-NACL 20-0.9 MG/250ML-% IV SOLN: 0.0000 ug/min | INTRAVENOUS | Status: DC

## 2020-04-30 MED ORDER — CHLORHEXIDINE GLUCONATE CLOTH 2 % EX PADS
6.0000 | MEDICATED_PAD | Freq: Every day | CUTANEOUS | Status: DC
  Administered 2020-04-30 – 2020-05-08 (×7): 6 via TOPICAL

## 2020-04-30 MED ORDER — ASPIRIN EC 325 MG PO TBEC: 325.0000 mg | DELAYED_RELEASE_TABLET | Freq: Every day | ORAL | Status: DC

## 2020-04-30 MED ORDER — PROPOFOL 10 MG/ML IV BOLUS
INTRAVENOUS | Status: DC | PRN
  Administered 2020-04-30: 25 mg via INTRAVENOUS
  Administered 2020-04-30: 50 mg via INTRAVENOUS

## 2020-04-30 MED ORDER — NOREPINEPHRINE 4 MG/250ML-% IV SOLN
0.0000 ug/min | INTRAVENOUS | Status: DC
  Administered 2020-05-01: 3 ug/min via INTRAVENOUS
  Filled 2020-04-30: qty 250

## 2020-04-30 MED ORDER — ARTIFICIAL TEARS OPHTHALMIC OINT
TOPICAL_OINTMENT | OPHTHALMIC | Status: DC | PRN
  Administered 2020-04-30: 1 via OPHTHALMIC

## 2020-04-30 MED ORDER — ORAL CARE MOUTH RINSE
15.0000 mL | Freq: Two times a day (BID) | OROMUCOSAL | Status: DC
  Administered 2020-05-01 – 2020-05-05 (×9): 15 mL via OROMUCOSAL

## 2020-04-30 MED ORDER — PROPOFOL 10 MG/ML IV BOLUS
INTRAVENOUS | Status: AC
  Filled 2020-04-30: qty 20

## 2020-04-30 MED ORDER — VECURONIUM BROMIDE 10 MG IV SOLR
INTRAVENOUS | Status: AC
  Filled 2020-04-30: qty 10

## 2020-04-30 MED ORDER — VECURONIUM BROMIDE 10 MG IV SOLR
INTRAVENOUS | Status: DC | PRN
  Administered 2020-04-30 (×2): 2 mg via INTRAVENOUS
  Administered 2020-04-30 (×2): 3 mg via INTRAVENOUS
  Administered 2020-04-30: 10 mg via INTRAVENOUS

## 2020-04-30 MED ORDER — VANCOMYCIN HCL 1000 MG IV SOLR
INTRAVENOUS | Status: DC | PRN
  Administered 2020-04-30 (×3): 1000 mg

## 2020-04-30 MED ORDER — PANTOPRAZOLE SODIUM 40 MG PO TBEC
40.0000 mg | DELAYED_RELEASE_TABLET | Freq: Every day | ORAL | Status: DC
  Administered 2020-05-02 – 2020-05-05 (×4): 40 mg via ORAL
  Filled 2020-04-30 (×4): qty 1

## 2020-04-30 MED ORDER — CHLORHEXIDINE GLUCONATE 0.12 % MT SOLN
15.0000 mL | OROMUCOSAL | Status: AC
  Administered 2020-04-30: 15 mL via OROMUCOSAL

## 2020-04-30 MED ORDER — METOPROLOL TARTRATE 12.5 MG HALF TABLET
12.5000 mg | ORAL_TABLET | Freq: Two times a day (BID) | ORAL | Status: DC
  Administered 2020-05-02: 12.5 mg via ORAL
  Filled 2020-04-30: qty 1

## 2020-04-30 MED ORDER — VANCOMYCIN HCL 1000 MG IV SOLR
INTRAVENOUS | Status: AC
  Filled 2020-04-30: qty 3000

## 2020-04-30 MED ORDER — LACTATED RINGERS IV SOLN: INTRAVENOUS | Status: DC | PRN

## 2020-04-30 MED ORDER — HEMOSTATIC AGENTS (NO CHARGE) OPTIME
TOPICAL | Status: DC | PRN
  Administered 2020-04-30: 1 via TOPICAL

## 2020-04-30 MED ORDER — TRANEXAMIC ACID 1000 MG/10ML IV SOLN
1.5000 mg/kg/h | INTRAVENOUS | Status: DC
  Filled 2020-04-30: qty 25

## 2020-04-30 MED ORDER — LACTATED RINGERS IV SOLN: INTRAVENOUS | Status: DC

## 2020-04-30 MED ORDER — NITROGLYCERIN IN D5W 200-5 MCG/ML-% IV SOLN: 0.0000 ug/min | INTRAVENOUS | Status: DC

## 2020-04-30 MED ORDER — SODIUM CHLORIDE 0.9 % IV SOLN
1.5000 g | Freq: Two times a day (BID) | INTRAVENOUS | Status: AC
  Administered 2020-04-30 – 2020-05-02 (×4): 1.5 g via INTRAVENOUS
  Filled 2020-04-30 (×4): qty 1.5

## 2020-04-30 MED ORDER — SODIUM CHLORIDE 0.9% FLUSH: 3.0000 mL | INTRAVENOUS | Status: DC | PRN

## 2020-04-30 MED ORDER — BISACODYL 10 MG RE SUPP
10.0000 mg | Freq: Every day | RECTAL | Status: DC
  Administered 2020-05-06 – 2020-05-07 (×2): 10 mg via RECTAL
  Filled 2020-04-30 (×2): qty 1

## 2020-04-30 MED ORDER — INSULIN REGULAR(HUMAN) IN NACL 100-0.9 UT/100ML-% IV SOLN: INTRAVENOUS | Status: DC

## 2020-04-30 MED ORDER — PLATELET RICH PLASMA OPTIME
Status: DC | PRN
  Administered 2020-04-30: 10 mL

## 2020-04-30 MED ORDER — DOCUSATE SODIUM 100 MG PO CAPS
200.0000 mg | ORAL_CAPSULE | Freq: Every day | ORAL | Status: DC
  Administered 2020-05-01 – 2020-05-05 (×5): 200 mg via ORAL
  Filled 2020-04-30 (×5): qty 2

## 2020-04-30 MED ORDER — CALCIUM CHLORIDE 10 % IV SOLN
INTRAVENOUS | Status: DC | PRN
  Administered 2020-04-30: 400 mg via INTRAVENOUS
  Administered 2020-04-30: 200 mg via INTRAVENOUS
  Administered 2020-04-30: 400 mg via INTRAVENOUS

## 2020-04-30 MED ORDER — MIDAZOLAM HCL (PF) 10 MG/2ML IJ SOLN
INTRAMUSCULAR | Status: AC
  Filled 2020-04-30: qty 2

## 2020-04-30 MED ORDER — OXYCODONE HCL 5 MG PO TABS
5.0000 mg | ORAL_TABLET | ORAL | Status: DC | PRN
  Administered 2020-04-30 – 2020-05-05 (×7): 10 mg via ORAL
  Filled 2020-04-30 (×7): qty 2

## 2020-04-30 MED ORDER — SODIUM CHLORIDE 0.9% FLUSH
3.0000 mL | Freq: Two times a day (BID) | INTRAVENOUS | Status: DC
  Administered 2020-05-01 – 2020-05-05 (×8): 3 mL via INTRAVENOUS

## 2020-04-30 MED ORDER — BISACODYL 5 MG PO TBEC
10.0000 mg | DELAYED_RELEASE_TABLET | Freq: Every day | ORAL | Status: DC
  Administered 2020-05-02 – 2020-05-05 (×4): 10 mg via ORAL
  Filled 2020-04-30 (×5): qty 2

## 2020-04-30 MED ORDER — MORPHINE SULFATE (PF) 2 MG/ML IV SOLN
1.0000 mg | INTRAVENOUS | Status: DC | PRN
  Administered 2020-04-30: 4 mg via INTRAVENOUS
  Administered 2020-04-30 (×2): 2 mg via INTRAVENOUS
  Administered 2020-04-30: 4 mg via INTRAVENOUS
  Administered 2020-05-01: 2 mg via INTRAVENOUS
  Administered 2020-05-02: 4 mg via INTRAVENOUS
  Filled 2020-04-30 (×4): qty 2
  Filled 2020-04-30: qty 1

## 2020-04-30 MED ORDER — THROMBIN (RECOMBINANT) 5000 UNITS EX SOLR
CUTANEOUS | Status: AC
  Filled 2020-04-30: qty 5000

## 2020-04-30 MED ORDER — MIDAZOLAM HCL 5 MG/5ML IJ SOLN
INTRAMUSCULAR | Status: DC | PRN
  Administered 2020-04-30: 2 mg via INTRAVENOUS
  Administered 2020-04-30: 4 mg via INTRAVENOUS
  Administered 2020-04-30 (×2): 2 mg via INTRAVENOUS

## 2020-04-30 MED ORDER — THROMBIN 5000 UNITS EX SOLR
INTRAVENOUS | Status: DC | PRN
  Administered 2020-04-30: 2 mL

## 2020-04-30 MED ORDER — MIDAZOLAM HCL 2 MG/2ML IJ SOLN
2.0000 mg | INTRAMUSCULAR | Status: DC | PRN
  Administered 2020-04-30: 2 mg via INTRAVENOUS
  Filled 2020-04-30: qty 2

## 2020-04-30 MED ORDER — ALBUMIN HUMAN 5 % IV SOLN: INTRAVENOUS | Status: DC | PRN

## 2020-04-30 MED ORDER — FAMOTIDINE IN NACL 20-0.9 MG/50ML-% IV SOLN
20.0000 mg | Freq: Two times a day (BID) | INTRAVENOUS | Status: AC
  Administered 2020-04-30: 20 mg via INTRAVENOUS

## 2020-04-30 MED ORDER — ASPIRIN 81 MG PO CHEW: 324.0000 mg | CHEWABLE_TABLET | Freq: Every day | ORAL | Status: DC

## 2020-04-30 MED ORDER — SODIUM CHLORIDE 0.9 % IV SOLN: INTRAVENOUS | Status: DC

## 2020-04-30 MED ORDER — POTASSIUM CHLORIDE 10 MEQ/50ML IV SOLN: 10.0000 meq | INTRAVENOUS | Status: AC

## 2020-04-30 MED ORDER — ASPIRIN EC 81 MG PO TBEC
81.0000 mg | DELAYED_RELEASE_TABLET | Freq: Every day | ORAL | Status: DC
  Administered 2020-05-01 – 2020-05-05 (×5): 81 mg via ORAL
  Filled 2020-04-30 (×5): qty 1

## 2020-04-30 MED ORDER — VASOPRESSIN 20 UNIT/ML IV SOLN
INTRAVENOUS | Status: AC
  Filled 2020-04-30: qty 1

## 2020-04-30 MED ORDER — 0.9 % SODIUM CHLORIDE (POUR BTL) OPTIME
TOPICAL | Status: DC | PRN
  Administered 2020-04-30: 6000 mL

## 2020-04-30 MED ORDER — DEXMEDETOMIDINE HCL IN NACL 400 MCG/100ML IV SOLN
0.0000 ug/kg/h | INTRAVENOUS | Status: DC
  Administered 2020-04-30: 0.1 ug/kg/h via INTRAVENOUS
  Filled 2020-04-30: qty 100

## 2020-04-30 MED ORDER — SODIUM CHLORIDE 0.9% FLUSH
10.0000 mL | Freq: Two times a day (BID) | INTRAVENOUS | Status: DC
  Administered 2020-04-30: 15 mL
  Administered 2020-05-01 – 2020-05-05 (×6): 10 mL

## 2020-04-30 MED ORDER — METOPROLOL TARTRATE 5 MG/5ML IV SOLN: 2.5000 mg | INTRAVENOUS | Status: DC | PRN

## 2020-04-30 MED ORDER — ARTIFICIAL TEARS OPHTHALMIC OINT
TOPICAL_OINTMENT | OPHTHALMIC | Status: AC
  Filled 2020-04-30: qty 3.5

## 2020-04-30 MED ORDER — ONDANSETRON HCL 4 MG/2ML IJ SOLN
4.0000 mg | Freq: Four times a day (QID) | INTRAMUSCULAR | Status: DC | PRN
  Administered 2020-05-06: 4 mg via INTRAVENOUS
  Filled 2020-04-30: qty 2

## 2020-04-30 MED ORDER — ACETAMINOPHEN 160 MG/5ML PO SOLN: 650.0000 mg | Freq: Once | ORAL | Status: AC

## 2020-04-30 MED ORDER — VASOPRESSIN 20 UNITS/100 ML INFUSION FOR SHOCK
0.0000 [IU]/min | INTRAVENOUS | Status: DC
  Administered 2020-04-30: .03 [IU]/min via INTRAVENOUS
  Administered 2020-04-30 – 2020-05-01 (×2): 0.04 [IU]/min via INTRAVENOUS
  Filled 2020-04-30 (×4): qty 100

## 2020-04-30 MED ORDER — VANCOMYCIN HCL IN DEXTROSE 1-5 GM/200ML-% IV SOLN
1000.0000 mg | Freq: Once | INTRAVENOUS | Status: AC
  Administered 2020-04-30: 1000 mg via INTRAVENOUS
  Filled 2020-04-30: qty 200

## 2020-04-30 MED ORDER — METOPROLOL TARTRATE 25 MG/10 ML ORAL SUSPENSION: 12.5000 mg | Freq: Two times a day (BID) | ORAL | Status: DC

## 2020-04-30 MED ORDER — VASOPRESSIN 20 UNIT/ML IV SOLN
0.4000 m[IU]/min | INTRAVENOUS | Status: DC
  Filled 2020-04-30: qty 0.5

## 2020-04-30 MED ORDER — SODIUM CHLORIDE 0.9 % IV SOLN: 250.0000 mL | INTRAVENOUS | Status: DC

## 2020-04-30 MED ORDER — VASOPRESSIN 20 UNIT/ML IV SOLN
INTRAVENOUS | Status: DC | PRN
  Administered 2020-04-30 (×3): 1 [IU] via INTRAVENOUS

## 2020-04-30 MED ORDER — LACTATED RINGERS IV SOLN: 500.0000 mL | Freq: Once | INTRAVENOUS | Status: DC | PRN

## 2020-04-30 MED ORDER — ACETAMINOPHEN 500 MG PO TABS
1000.0000 mg | ORAL_TABLET | Freq: Four times a day (QID) | ORAL | Status: DC
  Administered 2020-04-30 – 2020-05-03 (×10): 1000 mg via ORAL
  Filled 2020-04-30 (×10): qty 2

## 2020-04-30 MED ORDER — METOCLOPRAMIDE HCL 5 MG/ML IJ SOLN
10.0000 mg | Freq: Four times a day (QID) | INTRAMUSCULAR | Status: AC
  Administered 2020-04-30 – 2020-05-01 (×3): 10 mg via INTRAVENOUS
  Filled 2020-04-30 (×3): qty 2

## 2020-04-30 MED ORDER — MAGNESIUM SULFATE 4 GM/100ML IV SOLN
4.0000 g | Freq: Once | INTRAVENOUS | Status: AC
  Administered 2020-04-30: 4 g via INTRAVENOUS

## 2020-04-30 MED ORDER — HEPARIN SODIUM (PORCINE) 1000 UNIT/ML IJ SOLN
INTRAMUSCULAR | Status: AC
  Filled 2020-04-30: qty 1

## 2020-04-30 MED ORDER — EPINEPHRINE HCL 5 MG/250ML IV SOLN IN NS: 0.5000 ug/min | INTRAVENOUS | Status: DC

## 2020-04-30 MED ORDER — ACETAMINOPHEN 160 MG/5ML PO SOLN: 1000.0000 mg | Freq: Four times a day (QID) | ORAL | Status: DC

## 2020-04-30 MED ORDER — SODIUM CHLORIDE 0.9% FLUSH: 10.0000 mL | INTRAVENOUS | Status: DC | PRN

## 2020-04-30 MED ORDER — PHENYLEPHRINE 40 MCG/ML (10ML) SYRINGE FOR IV PUSH (FOR BLOOD PRESSURE SUPPORT)
PREFILLED_SYRINGE | INTRAVENOUS | Status: AC
  Filled 2020-04-30: qty 10

## 2020-04-30 MED ORDER — SODIUM BICARBONATE 8.4 % IV SOLN
100.0000 meq | Freq: Once | INTRAVENOUS | Status: AC
  Administered 2020-04-30: 100 meq via INTRAVENOUS

## 2020-04-30 MED ORDER — PLATELET POOR PLASMA OPTIME
Status: DC | PRN
  Administered 2020-04-30: 10 mL

## 2020-04-30 MED ORDER — HEPARIN SODIUM (PORCINE) 1000 UNIT/ML IJ SOLN
INTRAMUSCULAR | Status: AC
  Filled 2020-04-30: qty 2

## 2020-04-30 MED ORDER — PLASMA-LYTE A IV SOLN: INTRAVENOUS | Status: DC

## 2020-04-30 MED ORDER — TRAMADOL HCL 50 MG PO TABS
50.0000 mg | ORAL_TABLET | ORAL | Status: DC | PRN
  Administered 2020-05-01 – 2020-05-06 (×8): 100 mg via ORAL
  Filled 2020-04-30 (×8): qty 2

## 2020-04-30 MED ORDER — DEXTROSE 50 % IV SOLN
0.0000 mL | INTRAVENOUS | Status: DC | PRN
  Administered 2020-04-30: 40 mL via INTRAVENOUS
  Administered 2020-05-07: 25 mL via INTRAVENOUS
  Administered 2020-05-07: 50 mL via INTRAVENOUS
  Administered 2020-05-07 – 2020-05-09 (×3): 25 mL via INTRAVENOUS
  Filled 2020-04-30 (×5): qty 50

## 2020-04-30 MED ORDER — PHENYLEPHRINE HCL (PRESSORS) 10 MG/ML IV SOLN
INTRAVENOUS | Status: AC
  Filled 2020-04-30: qty 1

## 2020-04-30 MED ORDER — ALBUMIN HUMAN 5 % IV SOLN
250.0000 mL | INTRAVENOUS | Status: AC | PRN
  Administered 2020-04-30 (×2): 12.5 g via INTRAVENOUS

## 2020-04-30 MED ORDER — ACETAMINOPHEN 650 MG RE SUPP
650.0000 mg | Freq: Once | RECTAL | Status: AC
  Administered 2020-04-30: 650 mg via RECTAL

## 2020-04-30 MED ORDER — SUFENTANIL CITRATE 250 MCG/5ML IV SOLN
INTRAVENOUS | Status: AC
  Filled 2020-04-30: qty 5

## 2020-04-30 MED ORDER — SUFENTANIL CITRATE 50 MCG/ML IV SOLN
INTRAVENOUS | Status: DC | PRN
  Administered 2020-04-30: 10 ug via INTRAVENOUS
  Administered 2020-04-30: 20 ug via INTRAVENOUS
  Administered 2020-04-30: 10 ug via INTRAVENOUS
  Administered 2020-04-30: 30 ug via INTRAVENOUS
  Administered 2020-04-30 (×2): 10 ug via INTRAVENOUS

## 2020-04-30 MED ORDER — PROTAMINE SULFATE 10 MG/ML IV SOLN
INTRAVENOUS | Status: DC | PRN
  Administered 2020-04-30: 50 mg via INTRAVENOUS
  Administered 2020-04-30: 470 mg via INTRAVENOUS

## 2020-04-30 SURGICAL SUPPLY — 125 items
ADAPTER CARDIO PERF ANTE/RETRO (ADAPTER) ×2 IMPLANT
ADAPTER MULTI PERFUSION 15 (ADAPTER) ×1 IMPLANT
ADH SKN CLS APL DERMABOND .7 (GAUZE/BANDAGES/DRESSINGS) ×1
ADH SKN CLS LQ APL DERMABOND (GAUZE/BANDAGES/DRESSINGS) ×1
ADPR PRFSN 84XANTGRD RTRGD (ADAPTER) ×1
ADPR TBG 2 MALE LL ART (MISCELLANEOUS) ×1
APL SRG 7X2 LUM MLBL SLNT (VASCULAR PRODUCTS)
APL SWBSTK 6 STRL LF DISP (MISCELLANEOUS) ×1
APPLICATOR COTTON TIP 6 STRL (MISCELLANEOUS) IMPLANT
APPLICATOR COTTON TIP 6IN STRL (MISCELLANEOUS) ×2
APPLICATOR TIP COSEAL (VASCULAR PRODUCTS) IMPLANT
BAG DECANTER FOR FLEXI CONT (MISCELLANEOUS) ×1 IMPLANT
BLADE CLIPPER SURG (BLADE) ×1 IMPLANT
BLADE STERNUM SYSTEM 6 (BLADE) ×2 IMPLANT
BLADE SURG 11 STRL SS (BLADE) ×1 IMPLANT
BLADE SURG 15 STRL LF DISP TIS (BLADE) ×1 IMPLANT
BLADE SURG 15 STRL SS (BLADE) ×2
CABLE SURGICAL S-101-97-12 (CABLE) ×1 IMPLANT
CANISTER SUCT 3000ML PPV (MISCELLANEOUS) ×2 IMPLANT
CANN PRFSN 3/8XRT ANG TPR 14 (MISCELLANEOUS) ×1
CANNULA AORTIC ROOT 9FR (CANNULA) ×1 IMPLANT
CANNULA NON VENT 22FR 12 (CANNULA) ×1 IMPLANT
CANNULA PRFSN 3/8XRT ANG TPR14 (MISCELLANEOUS) IMPLANT
CANNULA SUMP PERICARDIAL (CANNULA) ×3 IMPLANT
CANNULA VEN MTL TIP RT (MISCELLANEOUS) ×2
CANNULA VRC MALB SNGL STG 34FR (MISCELLANEOUS) IMPLANT
CATH CPB KIT HENDRICKSON (MISCELLANEOUS) ×1 IMPLANT
CATH RETROPLEGIA CORONARY 14FR (CATHETERS) ×3 IMPLANT
CATH ROBINSON RED A/P 18FR (CATHETERS) ×7 IMPLANT
CNTNR URN SCR LID CUP LEK RST (MISCELLANEOUS) ×1 IMPLANT
CONN 1/2X1/2X1/2  BEN (MISCELLANEOUS) ×2
CONN 1/2X1/2X1/2 BEN (MISCELLANEOUS) IMPLANT
CONN 3/8X1/2 ST GISH (MISCELLANEOUS) ×2 IMPLANT
CONN ST 1/4X3/8  BEN (MISCELLANEOUS) ×6
CONN ST 1/4X3/8 BEN (MISCELLANEOUS) IMPLANT
CONN Y 3/8X3/8X3/8  BEN (MISCELLANEOUS) ×2
CONN Y 3/8X3/8X3/8 BEN (MISCELLANEOUS) IMPLANT
CONT SPEC 4OZ STRL OR WHT (MISCELLANEOUS) ×6
DERMABOND ADHESIVE PROPEN (GAUZE/BANDAGES/DRESSINGS) ×1
DERMABOND ADVANCED (GAUZE/BANDAGES/DRESSINGS) ×1
DERMABOND ADVANCED .7 DNX12 (GAUZE/BANDAGES/DRESSINGS) IMPLANT
DERMABOND ADVANCED .7 DNX6 (GAUZE/BANDAGES/DRESSINGS) IMPLANT
DEVICE SUT CK QUICK LOAD MINI (Prosthesis & Implant Heart) ×1 IMPLANT
DRAIN CHANNEL 28F RND 3/8 FF (WOUND CARE) ×4 IMPLANT
DRAPE SLUSH/WARMER DISC (DRAPES) IMPLANT
DRSG AQUACEL AG ADV 3.5X14 (GAUZE/BANDAGES/DRESSINGS) ×1 IMPLANT
DRSG COVADERM 4X14 (GAUZE/BANDAGES/DRESSINGS) ×2 IMPLANT
DURAGUARD 06CMX08CM ×1 IMPLANT
ELECT CAUTERY BLADE 6.4 (BLADE) ×2 IMPLANT
ELECT REM PT RETURN 9FT ADLT (ELECTROSURGICAL) ×4
ELECTRODE REM PT RTRN 9FT ADLT (ELECTROSURGICAL) ×2 IMPLANT
FELT TEFLON 1X6 (MISCELLANEOUS) ×2 IMPLANT
GAUZE SPONGE 4X4 12PLY STRL (GAUZE/BANDAGES/DRESSINGS) ×2 IMPLANT
GLOVE BIO SURGEON STRL SZ 6 (GLOVE) ×3 IMPLANT
GLOVE BIO SURGEON STRL SZ 6.5 (GLOVE) ×6 IMPLANT
GLOVE BIO SURGEON STRL SZ7 (GLOVE) IMPLANT
GLOVE BIO SURGEON STRL SZ7.5 (GLOVE) IMPLANT
GLOVE NEODERM STRL 7.5  LF PF (GLOVE) ×3
GLOVE NEODERM STRL 7.5 LF PF (GLOVE) ×2 IMPLANT
GLOVE SURG NEODERM 7.5  LF PF (GLOVE) ×3
GOWN STRL REUS W/ TWL LRG LVL3 (GOWN DISPOSABLE) ×4 IMPLANT
GOWN STRL REUS W/TWL LRG LVL3 (GOWN DISPOSABLE) ×16
HEMOSTAT POWDER SURGIFOAM 1G (HEMOSTASIS) ×6 IMPLANT
HEMOSTAT SURGICEL 2X14 (HEMOSTASIS) ×2 IMPLANT
INSERT FOGARTY XLG (MISCELLANEOUS) ×1 IMPLANT
INSERT SUTURE HOLDER (MISCELLANEOUS) ×1 IMPLANT
IV ADAPTER SYR DOUBLE MALE LL (MISCELLANEOUS) ×1 IMPLANT
KIT APPLICATOR RATIO 11:1 (KITS) ×1 IMPLANT
KIT BASIN OR (CUSTOM PROCEDURE TRAY) ×2 IMPLANT
KIT SUCTION CATH 14FR (SUCTIONS) ×2 IMPLANT
KIT SUT CK MINI COMBO 4X17 (Prosthesis & Implant Heart) ×1 IMPLANT
KIT TURNOVER KIT B (KITS) ×2 IMPLANT
LEAD BIPOLAR SUT EPI PAC 60CM (Generator) ×1 IMPLANT
LEAD PACING EPI (Prosthesis & Implant Heart) ×1 IMPLANT
LINE VENT (MISCELLANEOUS) ×1 IMPLANT
LOOP VESSEL SUPERMAXI WHITE (MISCELLANEOUS) ×1 IMPLANT
NS IRRIG 1000ML POUR BTL (IV SOLUTION) ×11 IMPLANT
PACK E OPEN HEART (SUTURE) ×2 IMPLANT
PACK OPEN HEART (CUSTOM PROCEDURE TRAY) ×2 IMPLANT
PACK PLATELET PROCEDURE 60 (MISCELLANEOUS) ×1 IMPLANT
PAD ARMBOARD 7.5X6 YLW CONV (MISCELLANEOUS) ×4 IMPLANT
POSITIONER HEAD DONUT 9IN (MISCELLANEOUS) ×2 IMPLANT
POWDER SURGICEL 3.0 GRAM (HEMOSTASIS) ×2 IMPLANT
SET CARDIOPLEGIA MPS 5001102 (MISCELLANEOUS) ×1 IMPLANT
SUT BONE WAX W31G (SUTURE) ×2 IMPLANT
SUT ETHIBON 2 0 V 52N 30 (SUTURE) ×4 IMPLANT
SUT ETHIBON EXCEL 2-0 V-5 (SUTURE) IMPLANT
SUT ETHIBOND V-5 VALVE (SUTURE) IMPLANT
SUT ETHIBOND X763 2 0 SH 1 (SUTURE) ×1 IMPLANT
SUT MNCRL AB 3-0 PS2 18 (SUTURE) ×1 IMPLANT
SUT PROLENE 3 0 SH 1 (SUTURE) ×2 IMPLANT
SUT PROLENE 3 0 SH 48 (SUTURE) ×4 IMPLANT
SUT PROLENE 3 0 SH DA (SUTURE) ×10 IMPLANT
SUT PROLENE 4 0 RB 1 (SUTURE) ×10
SUT PROLENE 4 0 SH DA (SUTURE) ×1 IMPLANT
SUT PROLENE 4-0 RB1 .5 CRCL 36 (SUTURE) ×4 IMPLANT
SUT PROLENE 5 0 C 1 36 (SUTURE) ×3 IMPLANT
SUT SILK  1 MH (SUTURE) ×4
SUT SILK 1 MH (SUTURE) IMPLANT
SUT STEEL 6MS V (SUTURE) IMPLANT
SUT STEEL STERNAL CCS#1 18IN (SUTURE) ×1 IMPLANT
SUT STEEL SZ 6 DBL 3X14 BALL (SUTURE) ×3 IMPLANT
SUT VIC AB 1 CTX 36 (SUTURE) ×4
SUT VIC AB 1 CTX36XBRD ANBCTR (SUTURE) ×2 IMPLANT
SUT VIC AB 2-0 CT1 27 (SUTURE) ×2
SUT VIC AB 2-0 CT1 TAPERPNT 27 (SUTURE) IMPLANT
SUT VIC AB 3-0 X1 27 (SUTURE) IMPLANT
SYR BULB IRRIG 60ML STRL (SYRINGE) ×1 IMPLANT
SYSTEM SAHARA CHEST DRAIN ATS (WOUND CARE) ×3 IMPLANT
TAPE CLOTH SURG 4X10 WHT LF (GAUZE/BANDAGES/DRESSINGS) ×1 IMPLANT
TAPE PAPER 2X10 WHT MICROPORE (GAUZE/BANDAGES/DRESSINGS) ×1 IMPLANT
TIP DUAL SPRAY TOPICAL (TIP) ×2 IMPLANT
TOWEL GREEN STERILE (TOWEL DISPOSABLE) ×2 IMPLANT
TOWEL GREEN STERILE FF (TOWEL DISPOSABLE) ×2 IMPLANT
TRAY CATH LUMEN 1 20CM STRL (SET/KITS/TRAYS/PACK) ×1 IMPLANT
TRAY FOLEY SLVR 14FR TEMP STAT (SET/KITS/TRAYS/PACK) ×1 IMPLANT
TRAY FOLEY SLVR 16FR TEMP STAT (SET/KITS/TRAYS/PACK) ×1 IMPLANT
TUBE CONNECTING 20X1/4 (TUBING) ×1 IMPLANT
TUBE SUCT INTRACARD DLP 20F (MISCELLANEOUS) ×1 IMPLANT
TUBING ART PRESS 48 MALE/FEM (TUBING) ×4 IMPLANT
UNDERPAD 30X36 HEAVY ABSORB (UNDERPADS AND DIAPERS) ×2 IMPLANT
VALVE ON-X MITRAL 31/33MM (Prosthesis & Implant Heart) ×1 IMPLANT
VRC MALLEABLE SINGLE STG 34FR (MISCELLANEOUS) ×2
WATER STERILE IRR 1000ML POUR (IV SOLUTION) ×4 IMPLANT
YANKAUER SUCT BULB TIP NO VENT (SUCTIONS) ×2 IMPLANT

## 2020-04-30 NOTE — Progress Notes (Signed)
Waunita Schooner, CRNA at bedside on patient arrival to assume care.

## 2020-04-30 NOTE — Procedures (Signed)
Extubation Procedure Note  Patient Details:   Name: Jonathan Shaffer DOB: [birth year] MRN: [id number]   Airway Documentation:    Vent end date: 04/11/2020 Vent end time: 1712   Evaluation  O2 sats: stable throughout Complications: No apparent complications Patient did tolerate procedure well. Bilateral Breath Sounds: Fine crackles, Diminished   Yes,  Prior to extubation, pt did have a positive cuff leak. Pt's VC= 1.9L and NIF= -40. Pt was extubated to [facility]. Pt tolerated well with SVS. No stridor noted and pt was able to state his name. RT will continue to monitor pt.  Jorje Guild 04/17/2020, 5:13 PM

## 2020-04-30 NOTE — Plan of Care (Signed)
Pt received 2u pRBC at shift change d/t Hg 6.8. F/u Hg 8.0. TXA still infusing, will finish bag. CT output 100cc/hr (~50 from MTs and 50 from PTs). Pt still mildly tachypneic, lips pale, and reports strong thirst when awake. CI 1.88/CO 4.82/MAP 72 on Epi 3 mcg/min , Vaso 0.04 u/min , and Levo 9mcg/min. 2 albumins given. UOP 20-25cc/hr.  Will continue to monitor closely.  Problem: Education: Goal: Knowledge of General Education information will improve Description: Including pain rating scale, medication(s)/side effects and non-pharmacologic comfort measures Outcome: Progressing   Problem: Health Behavior/Discharge Planning: Goal: Ability to manage health-related needs will improve Outcome: Progressing   Problem: Clinical Measurements: Goal: Ability to maintain clinical measurements within normal limits will improve Outcome: Progressing Goal: Will remain free from infection Outcome: Progressing Goal: Diagnostic test results will improve Outcome: Progressing Goal: Respiratory complications will improve Outcome: Progressing Goal: Cardiovascular complication will be avoided Outcome: Progressing   Problem: Activity: Goal: Risk for activity intolerance will decrease Outcome: Progressing   Problem: Nutrition: Goal: Adequate nutrition will be maintained Outcome: Progressing   Problem: Coping: Goal: Level of anxiety will decrease Outcome: Progressing   Problem: Elimination: Goal: Will not experience complications related to bowel motility Outcome: Progressing Goal: Will not experience complications related to urinary retention Outcome: Progressing   Problem: Pain Managment: Goal: General experience of comfort will improve Outcome: Progressing   Problem: Safety: Goal: Ability to remain free from injury will improve Outcome: Progressing   Problem: Skin Integrity: Goal: Risk for impaired skin integrity will decrease Outcome: Progressing   Problem: Education: Goal: Ability  to describe self-care measures that may prevent or decrease complications (Diabetes Survival Skills Education) will improve Outcome: Progressing Goal: Individualized Educational Video(s) Outcome: Progressing   Problem: Cardiac: Goal: Ability to maintain an adequate cardiac output will improve Outcome: Progressing   Problem: Health Behavior/Discharge Planning: Goal: Ability to identify and utilize available resources and services will improve Outcome: Progressing Goal: Ability to manage health-related needs will improve Outcome: Progressing   Problem: Fluid Volume: Goal: Ability to achieve a balanced intake and output will improve Outcome: Progressing   Problem: Metabolic: Goal: Ability to maintain appropriate glucose levels will improve Outcome: Progressing   Problem: Nutritional: Goal: Maintenance of adequate nutrition will improve Outcome: Progressing Goal: Maintenance of adequate weight for body size and type will improve Outcome: Progressing   Problem: Respiratory: Goal: Will regain and/or maintain adequate ventilation Outcome: Progressing   Problem: Urinary Elimination: Goal: Ability to achieve and maintain adequate renal perfusion and functioning will improve Outcome: Progressing

## 2020-04-30 NOTE — Progress Notes (Signed)
RT placed pt on Servo U NIV PCV/BIPAP on 18/6, 40% BUR 20. Pt respiratory status stable, no distress noted at this time. RT will continue to monitor.

## 2020-04-30 NOTE — Progress Notes (Signed)
Initiated Open Heart Rapid Wean per Protocol 

## 2020-04-30 NOTE — Progress Notes (Signed)
PROGRESS NOTE    Jonathan Shaffer  NOM:[id number] DOB: [birth year] DOA: 05/02/2020 PCP: Jonathan Shaffer   Brief Narrative:  Jonathan Shaffer is a 54 y/o gentleman admitted with sepsis, found to have MSSA bacteremia. He developed sudden onset nausea and vomiting and chills last week and later developed fevers and R shoulder pain. He was evaluated and felt to have a sprain. He was treated with an OP steroid injection and started oral steroids. He developed a painful rash on his hand. His progressive malaise, fevers, rash, and joint pain prompted return to the ED. At presentation he was febrile, tachycardic, tachypneic, with lactic acidosis. He was in DKA at presentation. He has a previous history of "high diabetes markers", which he reports he was not following up on. He was started on empiric vanc, zosyn & cefepime, and doxycycline. He has been evaluated by orthopaedics for possible septic arthritis of his R AC joint. ID has been consulted for MSSA+ blood cultures. No known history of IVDU or injuries prior to becoming ill. He has been encephalopathic this admission, prompting concern for septic emboli to the brain; MRI shows multiple small acute infarcts in multiple lobes/cerebellum  Assessment & Plan:  Septic shock due to MSSA bacteremia complicated by [facility] septic arthritis and clavicular osteomyelitis: -Patient has a splenic hemorrhages, embolic strokes, [name].  Continue to have fever of 100.8 and worsening leukocytosis.  Lactic acid: WNL.  He is tachycardic and tachypneic. -TEE from 11/24 shows 5 cm vegetation. -Appreciate ID assistance-continue cefazolin given sensitivities. -Repeat blood culture-follow -PICC line removed-on line holiday currently. -Ortho following irrigation and debridement of the right shoulder.  Mitral valve endocarditis and perivalvular abscess: -S/P Median sternotomy, debridement of MV annular abscess, MV replacement , placement of permanent epicardial pacemaking  system implant on 11/27 -TTE on 11/19 showed normal ejection fraction with no significant valvular dysfunction.  - Reviewed TEE from 11/24 -Cardiology consulted-patient underwent R/LHC on 11/26 which shows nonobstructive CAD, mild pulmonary hypertension. -Pt started on Zinacef, Continue cefazolin 2 g every 4 hours -Follow repeat cultures-so far negative.  Septic arthritis in right AC joint and OM distal clavicle and acromion and into the deltoid abscess and subacromial bursitis status post irrigation and drainage on 05/01/2020: -Ortho is following.  Appreciate input.  Right shoulder abscess culture shows pansensitive staph. -Penrose drain removed on 11/26  Splenic infarcts: -As Shaffer ID note from 11/24.  ID discussed with general surgery-they do not do splenectomy in these cases abscess/infarct.  Need to call surgery again if no source outside of the spleen  Acute encephalopathy: Multifactorial -Acute embolic infarcts versus hospital delirium versus underlying sepsis -Continue supportive care.  Patient's mentation is improving.  Reviewed MRI brain. -PT consulted  AKI: Improving.  Continue to hold nephrotoxic medication.  Monitor kidney function closely.  Hypokalemia: Resolved  Hyponatremia: Resolved  Uncontrolled new onset diabetes mellitus: Was in DKA before now resolved. -A1c: 11.3% -Continue sliding scale insulin.  Glargine 30 units, mealtime coverage to 7 units 3 times daily AC -Goal blood glucose 140-180 while critically ill. -Continue to monitor blood sugar closely  Acute anemia: -Suspect this is mixed secondary to critical illness versus dilutional. -H&H is stable.  Continue to monitor.  Transfuse if hemoglobin less than 7.  Hyperbilirubinemia likely in the setting of underlying sepsis. -LFTs: WNL.  Is improving from 2.3-1.7-1.4.  Wrist pain due to cellulitis: Continue antibiotics as above  Morbid obesity with BMI of 35 -Diet modification/exercise/weight loss  recommended.  Thrombocytosis: Platelet: 530-->542-->640.  Likely reactive secondary to underlying infection.  Continue to monitor.  DVT prophylaxis: Lovenox Code Status: Full code Family Communication: Patient's wife  present at bedside.  Plan of care discussed with patient in length and he verbalized understanding and agreed with it. Disposition Plan: To be determined  Consultants:   ID  Cardiology  Cardiothoracic surgery  ortho  Procedures:   TEE  TTE irrigation and drainage  MRI shoulder  MRI brain  MRI wrist S/P Median sternotomy, debridement of MV annular abscess, replacement of MV , placement of permanent epicardial pacemaking system implant on 11/27  Antimicrobials:   Nafcillin  Status is: Inpatient   Dispo: The patient is from: Home              Anticipated d/c is to: Home              Anticipated d/c date is: > 3 days              Patient currently is not medically stable to d/c.   Subjective: Patient seen in ICU, intubated & sedated, has bilateral chest tubes.  Objective: Vitals:   04/08/2020 0822 04/09/2020 0823 04/08/2020 0824 04/07/2020 0825  BP:      Pulse: 80 80 84 86  Resp: (!) 35 20 20 (!) 22  Temp:      TempSrc:      SpO2: 100% 96% 94% 100%  Weight:      Height:        Intake/Output Summary (Last 24 hours) at 04/15/2020 1522 Last data filed at 04/18/2020 1455 Gross Shaffer 24 hour  Intake 6172.13 ml  Output 4525 ml  Net 1647.13 ml   Filed Weights   04/28/20 0430 04/12/2020 0454 04/20/2020 0700  Weight: 128.9 kg 134.1 kg 132.1 kg    Examination: General exam: Intubated & sedated Respiratory system: Clear to auscultation. Respiratory effort normal. Cardiovascular system: dressing noted on the chest. Gastrointestinal system: Abdomen is nondistended Central nervous system: Intubated   Data Reviewed: I have personally reviewed following labs and imaging studies  CBC: Recent Labs  Lab 04/18/2020 0228 04/26/2020 0228 04/28/20 0014  04/28/20 0014 04/21/2020 0333 04/12/2020 0751 04/08/2020 2322 04/05/2020 2322 05/01/2020 0258 04/13/2020 0918 04/28/2020 1157 04/23/2020 1202 04/21/2020 1228 04/22/2020 1328 04/19/2020 1331  WBC 16.3*  --  16.0*  --  17.6*  --  20.6*  --  19.2*  --   --   --   --   --   --   NEUTROABS  --   --   --   --   --   --   --   --  17.5*  --   --   --   --   --   --   HGB 10.1*   < > 10.2*   < > 10.2*   < > 9.5*   < > 10.3*   < > 8.8* 7.3* 8.2* 7.5* 7.8*  HCT 31.4*   < > 30.9*   < > 30.8*   < > 29.4*   < > 33.0*   < > 26.0* 23.0* 24.0* 22.0* 23.0*  MCV 91.3  --  89.8  --  90.3  --  91.0  --  94.6  --   --   --   --   --   --   PLT 347   < > 436*  --  530*  --  542*  --  640*  --   --  379  --   --   --    < > =  values in this interval not displayed.   Basic Metabolic Panel: Recent Labs  Lab 04/26/20 0958 04/26/20 0958 04/20/2020 0228 05/01/2020 0228 04/28/20 0014 04/28/20 0014 04/28/2020 0333 04/19/2020 0751 04/14/2020 2322 04/04/2020 2322 04/19/2020 0918 04/09/2020 0927 04/07/2020 1013 04/12/2020 1025 05/02/2020 1057 04/24/2020 1057 04/29/2020 1129 04/20/2020 1157 04/13/2020 1228 04/24/2020 1328 04/22/2020 1331  NA 131*   < > 131*   < > 134*   < > 134*   < > 135   < > 138   < > 137   < > 138   < > 137 137 137 138 139  K 3.5   < > 3.4*   < > 3.9   < > 3.5   < > 3.5   < > 3.9   < > 3.7   < > 4.2   < > 4.1 4.1 4.9 5.0 4.8  CL 101   < > 102   < > 104   < > 101   < > 103   < > 100  --  100  --  101  --   --  102  --  104  --   CO2 20*  --  22  --  19*  --  22  --  22  --   --   --   --   --   --   --   --   --   --   --   --   GLUCOSE 218*   < > 137*   < > 188*   < > 174*   < > 88   < > 161*  --  156*  --  136*  --   --  125*  --  128*  --   BUN 22*   < > 17   < > 14   < > 9   < > 9   < > 8  --  9  --  9  --   --  9  --  10  --   CREATININE 1.25*   < > 1.13   < > 1.38*   < > 1.25*   < > 1.20   < > 0.90  --  1.00  --  0.90  --   --  0.90  --  1.00  --   CALCIUM 7.0*  --  7.0*  --  7.3*  --  7.5*  --  7.5*  --   --   --   --   --    --   --   --   --   --   --   --   MG  --   --  2.2  --   --   --   --   --   --   --   --   --   --   --   --   --   --   --   --   --   --    < > = values in this interval not displayed.   GFR: Estimated Creatinine Clearance: 123.6 mL/min (by C-G formula based on SCr of 1 mg/dL). Liver Function Tests: Recent Labs  Lab 05/02/2020 0500 04/28/2020 0228 04/28/20 0014 04/06/2020 2322  AST 24 20 24 22   ALT 36 21 20 14   ALKPHOS 91 73 83 85  BILITOT 2.3* 2.1* 1.7* 1.4*  PROT 5.2* 5.3* 5.5* 5.7*  ALBUMIN  1.6* 1.6* 1.3* 1.3*   No results for input(s): LIPASE, AMYLASE in the last 168 hours. No results for input(s): AMMONIA in the last 168 hours. Coagulation Profile: Recent Labs  Lab 04/04/2020 0258  INR 1.2   Cardiac Enzymes: No results for input(s): CKTOTAL, CKMB, CKMBINDEX, TROPONINI in the last 168 hours. BNP (last 3 results) No results for input(s): PROBNP in the last 8760 hours. HbA1C: Recent Labs    04/29/2020 0258  HGBA1C 10.8*   CBG: Recent Labs  Lab 04/17/2020 0616 04/14/2020 1202 04/11/2020 1657 04/20/2020 2206 04/30/20 0121  GLUCAP 166* 141* 130* 75 85   Lipid Profile: Recent Labs    04/22/2020 2322  CHOL 163  HDL 15*  LDLCALC 116*  TRIG 162*  CHOLHDL 10.9   Thyroid Function Tests: No results for input(s): TSH, T4TOTAL, FREET4, T3FREE, THYROIDAB in the last 72 hours. Anemia Panel: No results for input(s): VITAMINB12, FOLATE, FERRITIN, TIBC, IRON, RETICCTPCT in the last 72 hours. Sepsis Labs: Recent Labs  Lab 04/23/20 1701 04/23/20 2015 04/15/2020 2201 04/28/20 0014  LATICACIDVEN 1.3 1.4 1.5 1.4    Recent Results (from the past 240 hour(s))  Blood Culture (routine x 2)     Status: Abnormal   Collection Time: 04/25/2020  2:00 AM   Specimen: BLOOD RIGHT HAND  Result Value Ref Range Status   Specimen Description   Final    BLOOD RIGHT HAND Performed at [facility], [address] [place], [place] [address]    Special Requests   Final    BOTTLES  DRAWN AEROBIC AND ANAEROBIC Blood Culture adequate volume Performed at [facility], [address] [place], [place] [address]    Culture  Setup Time   Final    GRAM POSITIVE COCCI IN BOTH AEROBIC AND ANAEROBIC BOTTLES CRITICAL RESULT CALLED TO, READ BACK BY AND VERIFIED WITHSeward Meth [facility] 2109 04/21/2020 A BROWNING Performed at [facility], [place] [address] [place], [place] [address]    Culture STAPHYLOCOCCUS AUREUS (A)  Final   Report Status 04/24/2020 FINAL  Final   Organism ID, Bacteria STAPHYLOCOCCUS AUREUS  Final      Susceptibility   Staphylococcus aureus - MIC*    CIPROFLOXACIN <=0.5 SENSITIVE Sensitive     ERYTHROMYCIN <=0.25 SENSITIVE Sensitive     GENTAMICIN <=0.5 SENSITIVE Sensitive     OXACILLIN 0.5 SENSITIVE Sensitive     TETRACYCLINE <=1 SENSITIVE Sensitive     VANCOMYCIN 1 SENSITIVE Sensitive     TRIMETH/SULFA <=10 SENSITIVE Sensitive     CLINDAMYCIN <=0.25 SENSITIVE Sensitive     RIFAMPIN <=0.5 SENSITIVE Sensitive     Inducible Clindamycin NEGATIVE Sensitive     * STAPHYLOCOCCUS AUREUS  Blood Culture ID Panel (Reflexed)     Status: Abnormal   Collection Time: 04/30/2020  2:00 AM  Result Value Ref Range Status   Enterococcus faecalis NOT DETECTED NOT DETECTED Final   Enterococcus Faecium NOT DETECTED NOT DETECTED Final   Listeria monocytogenes NOT DETECTED NOT DETECTED Final   Staphylococcus species DETECTED (A) NOT DETECTED Final    Comment: CRITICAL RESULT CALLED TO, READ BACK BY AND VERIFIED WITH: N GOLGOVAC PHARMD 2109 04/29/2020 A BROWNING    Staphylococcus aureus (BCID) DETECTED (A) NOT DETECTED Final    Comment: CRITICAL RESULT CALLED TO, READ BACK BY AND VERIFIED WITH: N GOLGOVAC PHARMD 2109 04/08/2020 A BROWNING    Staphylococcus epidermidis NOT DETECTED NOT DETECTED Final   Staphylococcus lugdunensis NOT DETECTED NOT DETECTED Final  Streptococcus species NOT DETECTED NOT DETECTED Final   Streptococcus agalactiae NOT DETECTED NOT  DETECTED Final   Streptococcus pneumoniae NOT DETECTED NOT DETECTED Final   Streptococcus pyogenes NOT DETECTED NOT DETECTED Final   A.calcoaceticus-baumannii NOT DETECTED NOT DETECTED Final   Bacteroides fragilis NOT DETECTED NOT DETECTED Final   Enterobacterales NOT DETECTED NOT DETECTED Final   Enterobacter cloacae complex NOT DETECTED NOT DETECTED Final   Escherichia coli NOT DETECTED NOT DETECTED Final   Klebsiella aerogenes NOT DETECTED NOT DETECTED Final   Klebsiella oxytoca NOT DETECTED NOT DETECTED Final   Klebsiella pneumoniae NOT DETECTED NOT DETECTED Final   Proteus species NOT DETECTED NOT DETECTED Final   Salmonella species NOT DETECTED NOT DETECTED Final   Serratia marcescens NOT DETECTED NOT DETECTED Final   Haemophilus influenzae NOT DETECTED NOT DETECTED Final   Neisseria meningitidis NOT DETECTED NOT DETECTED Final   Pseudomonas aeruginosa NOT DETECTED NOT DETECTED Final   Stenotrophomonas maltophilia NOT DETECTED NOT DETECTED Final   Candida albicans NOT DETECTED NOT DETECTED Final   Candida auris NOT DETECTED NOT DETECTED Final   Candida glabrata NOT DETECTED NOT DETECTED Final   Candida krusei NOT DETECTED NOT DETECTED Final   Candida parapsilosis NOT DETECTED NOT DETECTED Final   Candida tropicalis NOT DETECTED NOT DETECTED Final   Cryptococcus neoformans/gattii NOT DETECTED NOT DETECTED Final   Meth resistant mecA/C and MREJ NOT DETECTED NOT DETECTED Final    Comment: Performed at [facility], [address] [place], [place] [address]  Urine culture     Status: Abnormal   Collection Time: 04/05/2020  2:02 AM   Specimen: In/Out Cath Urine  Result Value Ref Range Status   Specimen Description IN/OUT CATH URINE  Final   Special Requests NONE  Final   Culture 40,000 COLONIES/mL STAPHYLOCOCCUS AUREUS (A)  Final   Report Status 04/24/2020 FINAL  Final   Organism ID, Bacteria STAPHYLOCOCCUS AUREUS (A)  Final      Susceptibility   Staphylococcus aureus -  MIC*    CIPROFLOXACIN <=0.5 SENSITIVE Sensitive     GENTAMICIN <=0.5 SENSITIVE Sensitive     NITROFURANTOIN 32 SENSITIVE Sensitive     OXACILLIN <=0.25 SENSITIVE Sensitive     TETRACYCLINE <=1 SENSITIVE Sensitive     VANCOMYCIN 1 SENSITIVE Sensitive     TRIMETH/SULFA <=10 SENSITIVE Sensitive     CLINDAMYCIN <=0.25 SENSITIVE Sensitive     RIFAMPIN <=0.5 SENSITIVE Sensitive     Inducible Clindamycin NEGATIVE Sensitive     * 40,000 COLONIES/mL STAPHYLOCOCCUS AUREUS  Resp Panel by RT-PCR (Flu A&B, Covid) Nasopharyngeal Swab     Status: None   Collection Time: 04/27/2020  2:05 AM   Specimen: Nasopharyngeal Swab; Nasopharyngeal(NP) swabs in vial transport medium  Result Value Ref Range Status   SARS Coronavirus 2 by RT PCR NEGATIVE NEGATIVE Final    Comment: (NOTE) SARS-CoV-2 target nucleic acids are NOT DETECTED.  The SARS-CoV-2 RNA is generally detectable in upper respiratory specimens during the acute phase of infection. The lowest concentration of SARS-CoV-2 viral copies this assay can detect is 138 copies/mL. A negative result does not preclude SARS-Cov-2 infection and should not be used as the sole basis for treatment or other patient management decisions. A negative result may occur with  improper specimen collection/handling, submission of specimen other than nasopharyngeal swab, presence of viral mutation(s) within the areas targeted by this assay, and inadequate number of viral copies(<138 copies/mL). A negative result must be combined with clinical observations, patient history,  and epidemiological information. The expected result is Negative.  Fact Sheet for Patients:  EntrepreneurPulse.com.au  Fact Sheet for Healthcare Providers:  IncredibleEmployment.be  This test is no t yet approved or cleared by the [place] FDA and  has been authorized for detection and/or diagnosis of SARS-CoV-2 by FDA under an Emergency Use Authorization  (EUA). This EUA will remain  in effect (meaning this test can be used) for the duration of the COVID-19 declaration under Section 564(b)(1) of the Act, 21 U.S.C.section 360bbb-3(b)(1), unless the authorization is terminated  or revoked sooner.       Influenza A by PCR NEGATIVE NEGATIVE Final   Influenza B by PCR NEGATIVE NEGATIVE Final    Comment: (NOTE) The Xpert Xpress SARS-CoV-2/FLU/RSV plus assay is intended as an aid in the diagnosis of influenza from Nasopharyngeal swab specimens and should not be used as a sole basis for treatment. Nasal washings and aspirates are unacceptable for Xpert Xpress SARS-CoV-2/FLU/RSV testing.  Fact Sheet for Patients: EntrepreneurPulse.com.au  Fact Sheet for Healthcare Providers: IncredibleEmployment.be  This test is not yet approved or cleared by the [place] FDA and has been authorized for detection and/or diagnosis of SARS-CoV-2 by FDA under an Emergency Use Authorization (EUA). This EUA will remain in effect (meaning this test can be used) for the duration of the COVID-19 declaration under Section 564(b)(1) of the Act, 21 U.S.C. section 360bbb-3(b)(1), unless the authorization is terminated or revoked.  Performed at [facility], [address] [place], [place] [address]   Blood Culture (routine x 2)     Status: Abnormal   Collection Time: 04/11/2020  2:06 AM   Specimen: BLOOD LEFT FOREARM  Result Value Ref Range Status   Specimen Description   Final    BLOOD LEFT FOREARM Performed at [facility], [address] [place], [place] [address]    Special Requests   Final    BOTTLES DRAWN AEROBIC AND ANAEROBIC Blood Culture adequate volume Performed at [facility], [address] [place], [place] [address]    Culture  Setup Time   Final    GRAM POSITIVE COCCI IN BOTH AEROBIC AND ANAEROBIC BOTTLES CRITICAL VALUE NOTED.  VALUE IS CONSISTENT WITH PREVIOUSLY  REPORTED AND CALLED VALUE.    Culture (A)  Final    STAPHYLOCOCCUS AUREUS SUSCEPTIBILITIES PERFORMED ON PREVIOUS CULTURE WITHIN THE LAST 5 DAYS. Performed at [facility], [place] [address] [place], [place] [address]    Report Status 04/24/2020 FINAL  Final  MRSA PCR Screening     Status: None   Collection Time: 04/16/2020  6:24 AM   Specimen: Nasal Mucosa; Nasopharyngeal  Result Value Ref Range Status   MRSA by PCR NEGATIVE NEGATIVE Final    Comment:        The GeneXpert MRSA Assay (FDA approved for NASAL specimens only), is one component of a comprehensive MRSA colonization surveillance program. It is not intended to diagnose MRSA infection nor to guide or monitor treatment for MRSA infections. Performed at [facility], [place] [address] [place], [place] [address]   Culture, blood (single)     Status: Abnormal   Collection Time: 05/02/2020  1:53 PM   Specimen: BLOOD RIGHT HAND  Result Value Ref Range Status   Specimen Description   Final    BLOOD RIGHT HAND Performed at [place] [address], [place],  [address]    Special Requests   Final  BOTTLES DRAWN AEROBIC AND ANAEROBIC Blood Culture results may not be optimal due to an inadequate volume of blood received in culture bottles Performed at [facility], [place] [address], [place], [place] [address]    Culture  Setup Time   Final    ANAEROBIC BOTTLE ONLY GRAM POSITIVE COCCI CRITICAL RESULT CALLED TO, READ BACK BY AND VERIFIED WITH: Concepcion Elk [id number] MLM Performed at [facility], [address] [place], [place] [address]    Culture STAPHYLOCOCCUS AUREUS (A)  Final   Report Status 04/28/2020 FINAL  Final   Organism ID, Bacteria STAPHYLOCOCCUS AUREUS  Final      Susceptibility   Staphylococcus aureus - MIC*    CIPROFLOXACIN <=0.5 SENSITIVE Sensitive     ERYTHROMYCIN <=0.25 SENSITIVE Sensitive     GENTAMICIN <=0.5 SENSITIVE Sensitive      OXACILLIN 0.5 SENSITIVE Sensitive     TETRACYCLINE <=1 SENSITIVE Sensitive     VANCOMYCIN 1 SENSITIVE Sensitive     TRIMETH/SULFA <=10 SENSITIVE Sensitive     CLINDAMYCIN <=0.25 SENSITIVE Sensitive     RIFAMPIN <=0.5 SENSITIVE Sensitive     Inducible Clindamycin NEGATIVE Sensitive     * STAPHYLOCOCCUS AUREUS  Blood Culture ID Panel (Reflexed)     Status: Abnormal   Collection Time: 04/09/2020  1:53 PM  Result Value Ref Range Status   Enterococcus faecalis NOT DETECTED NOT DETECTED Final   Enterococcus Faecium NOT DETECTED NOT DETECTED Final   Listeria monocytogenes NOT DETECTED NOT DETECTED Final   Staphylococcus species DETECTED (A) NOT DETECTED Final    Comment: CRITICAL RESULT CALLED TO, READ BACK BY AND VERIFIED WITH: PHARMD M HICKS [id number] MLM    Staphylococcus aureus (BCID) DETECTED (A) NOT DETECTED Final    Comment: CRITICAL RESULT CALLED TO, READ BACK BY AND VERIFIED WITH: PHARMD M HICKS [id number] MLM    Staphylococcus epidermidis NOT DETECTED NOT DETECTED Final   Staphylococcus lugdunensis NOT DETECTED NOT DETECTED Final   Streptococcus species NOT DETECTED NOT DETECTED Final   Streptococcus agalactiae NOT DETECTED NOT DETECTED Final   Streptococcus pneumoniae NOT DETECTED NOT DETECTED Final   Streptococcus pyogenes NOT DETECTED NOT DETECTED Final   A.calcoaceticus-baumannii NOT DETECTED NOT DETECTED Final   Bacteroides fragilis NOT DETECTED NOT DETECTED Final   Enterobacterales NOT DETECTED NOT DETECTED Final   Enterobacter cloacae complex NOT DETECTED NOT DETECTED Final   Escherichia coli NOT DETECTED NOT DETECTED Final   Klebsiella aerogenes NOT DETECTED NOT DETECTED Final   Klebsiella oxytoca NOT DETECTED NOT DETECTED Final   Klebsiella pneumoniae NOT DETECTED NOT DETECTED Final   Proteus species NOT DETECTED NOT DETECTED Final   Salmonella species NOT DETECTED NOT DETECTED Final   Serratia marcescens NOT DETECTED NOT DETECTED Final   Haemophilus  influenzae NOT DETECTED NOT DETECTED Final   Neisseria meningitidis NOT DETECTED NOT DETECTED Final   Pseudomonas aeruginosa NOT DETECTED NOT DETECTED Final   Stenotrophomonas maltophilia NOT DETECTED NOT DETECTED Final   Candida albicans NOT DETECTED NOT DETECTED Final   Candida auris NOT DETECTED NOT DETECTED Final   Candida glabrata NOT DETECTED NOT DETECTED Final   Candida krusei NOT DETECTED NOT DETECTED Final   Candida parapsilosis NOT DETECTED NOT DETECTED Final   Candida tropicalis NOT DETECTED NOT DETECTED Final   Cryptococcus neoformans/gattii NOT DETECTED NOT DETECTED Final   Meth resistant mecA/C and MREJ NOT DETECTED NOT DETECTED Final    Comment: Performed at [facility], [address], [place], [place] [address]  Culture, blood (Routine X 2) w Reflex to ID Panel     Status: None   Collection Time: 04/24/20  7:52 AM   Specimen: BLOOD LEFT HAND  Result Value Ref Range Status   Specimen Description   Final    BLOOD LEFT HAND Performed at [facility], [place] [address], [place], [place] [address]    Special Requests   Final    BOTTLES DRAWN AEROBIC AND ANAEROBIC BLOOD LEFT HAND Performed at [place] [address] [place], [place] [address]    Culture   Final    NO GROWTH 5 DAYS Performed at [facility], [place] [address], [place], [place] [address]    Report Status 04/23/2020 FINAL  Final  Culture, blood (Routine X 2) w Reflex to ID Panel     Status: None   Collection Time: 04/24/20  7:52 AM   Specimen: BLOOD LEFT HAND  Result Value Ref Range Status   Specimen Description   Final    BLOOD LEFT HAND Performed at [facility], [place] [address] [place], [place] [address]    Special Requests   Final    BOTTLES DRAWN AEROBIC AND ANAEROBIC BLOOD LEFT HAND Performed at [place] [address] [place], [place] [address]    Culture   Final    NO GROWTH 5 DAYS Performed at [facility],  [place] [address] [place], [place] [address]    Report Status 04/16/2020 FINAL  Final  Aerobic/Anaerobic Culture (surgical/deep wound)     Status: None (Preliminary result)   Collection Time: 04/24/2020  2:48 PM   Specimen: PATH Other; Tissue  Result Value Ref Range Status   Specimen Description   Final    ABSCESS SHOULDER RIGHT Performed at [place] [address], [place], [place] [address]    Special Requests   Final    NONE Performed at [facility], [place] [address], [place], [place] [address]    Gram Stain   Final    FEW WBC PRESENT, PREDOMINANTLY PMN ABUNDANT GRAM POSITIVE COCCI Performed at [facility], [place] [address], [place], [place] [address]    Culture   Final    MODERATE STAPHYLOCOCCUS AUREUS NO ANAEROBES ISOLATED; CULTURE IN PROGRESS FOR 5 DAYS    Report Status PENDING  Incomplete   Organism ID, Bacteria STAPHYLOCOCCUS AUREUS  Final      Susceptibility   Staphylococcus aureus - MIC*    CIPROFLOXACIN <=0.5 SENSITIVE Sensitive     ERYTHROMYCIN <=0.25 SENSITIVE Sensitive     GENTAMICIN <=0.5 SENSITIVE Sensitive     OXACILLIN 0.5 SENSITIVE Sensitive     TETRACYCLINE <=1 SENSITIVE Sensitive     VANCOMYCIN 1 SENSITIVE Sensitive     TRIMETH/SULFA <=10 SENSITIVE Sensitive     CLINDAMYCIN <=0.25 SENSITIVE Sensitive     RIFAMPIN <=0.5 SENSITIVE Sensitive     Inducible Clindamycin NEGATIVE Sensitive     * MODERATE STAPHYLOCOCCUS AUREUS  Aerobic/Anaerobic Culture (surgical/deep wound)     Status: None (Preliminary result)   Collection Time: 04/24/2020  2:58 PM   Specimen: PATH Other; Tissue  Result Value Ref Range Status   Specimen Description   Final    TISSUE SHOULDER RIGHT Performed at [facility] [address] [place], [place] [address]    Special Requests   Final    NONE Performed at [facility], [address] [place], [place] [address]    Gram Stain   Final    ABUNDANT WBC  PRESENT, PREDOMINANTLY PMN ABUNDANT GRAM POSITIVE COCCI Performed at [facility], [place] [address], [place], [place] [address]    Culture   Final    ABUNDANT STAPHYLOCOCCUS AUREUS NO ANAEROBES ISOLATED; CULTURE IN PROGRESS FOR 5 DAYS    Report Status PENDING  Incomplete   Organism ID, Bacteria STAPHYLOCOCCUS AUREUS  Final      Susceptibility   Staphylococcus aureus - MIC*    CIPROFLOXACIN <=0.5 SENSITIVE Sensitive     ERYTHROMYCIN <=0.25 SENSITIVE Sensitive     GENTAMICIN <=0.5 SENSITIVE Sensitive     OXACILLIN 0.5 SENSITIVE Sensitive     TETRACYCLINE <=1 SENSITIVE Sensitive     VANCOMYCIN <=0.5 SENSITIVE Sensitive     TRIMETH/SULFA <=10 SENSITIVE Sensitive     CLINDAMYCIN <=0.25 SENSITIVE Sensitive     RIFAMPIN <=0.5 SENSITIVE Sensitive     Inducible Clindamycin NEGATIVE Sensitive     * ABUNDANT STAPHYLOCOCCUS AUREUS  Culture, blood (Routine X 2) w Reflex to ID Panel     Status: None (Preliminary result)   Collection Time: 04/26/20  9:58 AM   Specimen: BLOOD RIGHT HAND  Result Value Ref Range Status   Specimen Description   Final    BLOOD RIGHT HAND Performed at [facility] [address], [place], [place] [address]    Special Requests   Final    BOTTLES DRAWN AEROBIC ONLY Blood Culture results may not be optimal due to an inadequate volume of blood received in culture bottles Performed at [facility] [address] [place], [place] [address]    Culture   Final    NO GROWTH 4 DAYS Performed at [facility], [place] [address] [place], [place]  [address]    Report Status PENDING  Incomplete  Culture, blood (Routine X 2) w Reflex to ID Panel     Status: None (Preliminary result)   Collection Time: 04/26/20  9:58 AM   Specimen: BLOOD RIGHT HAND  Result Value Ref Range Status   Specimen Description   Final    BLOOD RIGHT HAND Performed at [facility] [address] [place], [place] [address]     Special Requests   Final    BOTTLES DRAWN AEROBIC ONLY Blood Culture results may not be optimal due to an inadequate volume of blood received in culture bottles Performed at [facility] [address] [place], [place] [address]    Culture   Final    NO GROWTH 4 DAYS Performed at [facility], [place] [address] [place], [place] [address]    Report Status PENDING  Incomplete  Surgical pcr screen     Status: Abnormal   Collection Time: 04/20/2020  2:39 AM   Specimen: Nasal Mucosa; Nasal Swab  Result Value Ref Range Status   MRSA, PCR NEGATIVE NEGATIVE Final   Staphylococcus aureus POSITIVE (A) NEGATIVE Final    Comment: (NOTE) The Xpert SA Assay (FDA approved for NASAL specimens in patients 9 years of age and older), is one component of a comprehensive surveillance program. It is not intended to diagnose infection nor to guide or monitor treatment. Performed at [facility], [place] [address] [place], [place] [address]       Radiology Studies: CARDIAC CATHETERIZATION  Result Date: 04/10/2020  Prox Cx lesion is 25% stenosed.  Mid LAD lesion is 10%  stenosed.  LV end diastolic pressure is moderately elevated. LVEDP 29 mm Hg.  There is no aortic valve stenosis.  Hemodynamic findings consistent with mild pulmonary hypertension.  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 Continue with plans for mitral valve intervention with Dr. Orvan Seen.    Scheduled Meds: . sodium chloride   Intravenous Once  . [START ON 05/01/2020] acetaminophen  1,000 mg Oral Q6H   Or  . [START ON 05/01/2020] acetaminophen (TYLENOL) oral liquid 160 mg/5 mL  1,000 mg Shaffer Tube Q6H  . acetaminophen (TYLENOL) oral liquid 160 mg/5 mL  650 mg Shaffer Tube Once   Or  . acetaminophen  650 mg Rectal Once  . [START ON 05/01/2020] aspirin EC  325 mg Oral Daily   Or  .  [START ON 05/01/2020] aspirin  324 mg Shaffer Tube Daily  . atorvastatin  40 mg Oral Daily  . [START ON 05/01/2020] bisacodyl  10 mg Oral Daily   Or  . [START ON 05/01/2020] bisacodyl  10 mg Rectal Daily  . chlorhexidine  15 mL Mouth Rinse BID  . chlorhexidine  15 mL Mouth/Throat NOW  . [START ON 05/01/2020] docusate sodium  200 mg Oral Daily  . metoCLOPramide (REGLAN) injection  10 mg Intravenous Q6H  . metoprolol tartrate  12.5 mg Oral BID   Or  . metoprolol tartrate  12.5 mg Shaffer Tube BID  . [START ON 05/02/2020] pantoprazole  40 mg Oral Daily  . [START ON 05/01/2020] sodium chloride flush  3 mL Intravenous Q12H   Continuous Infusions: . sodium chloride    . [START ON 05/01/2020] sodium chloride    . sodium chloride    . albumin human    . cefUROXime (ZINACEF)  IV    . dexmedetomidine (PRECEDEX) IV infusion    . famotidine (PEPCID) IV    . insulin    . lactated ringers    . lactated ringers    . lactated ringers    . magnesium sulfate    . nafcillin (NAFCIL) continuous infusion 12 g (04/20/2020 1401)  . nitroGLYCERIN    . norepinephrine (LEVOPHED) Adult infusion    . phenylephrine (NEO-SYNEPHRINE) Adult infusion    . potassium chloride    . vancomycin    . vasopressin 0.04 Units/min (04/06/2020 1312)     LOS: 8 days   Time spent: 45 minutes   Yohann Curl Loann Quill, MD [facility]  If 7PM-7AM, please contact night-coverage [email or web address] 04/06/2020, 3:22 PM

## 2020-04-30 NOTE — Transfer of Care (Signed)
Immediate Anesthesia Transfer of Care Note  Patient: Jonathan Shaffer  Procedure(s) Performed: MITRAL VALVE (MV) REPLACEMENT USING ON-X MITRAL VALVE SIZE 31/33 MM,  PERMANENT EPICARDIAL PACEMAKING SYSTEM IMPLANT USING MEDTRONIC LEADS (N/A Chest) TRANSESOPHAGEAL ECHOCARDIOGRAM (TEE) (N/A Chest)  Patient Location: ICU  Anesthesia Type:General  Level of Consciousness: unresponsive and Patient remains intubated per anesthesia plan  Airway & Oxygen Therapy: Patient remains intubated per anesthesia plan and Patient placed on Ventilator (see vital sign flow sheet for setting)  Post-op Assessment: Report given to RN and Post -op Vital signs reviewed and stable  Post vital signs: Reviewed and stable  Last Vitals:  Vitals Value Taken Time  BP    Temp 37.1 C 04/26/2020 1528  Pulse 91 05/01/2020 1521  Resp 19 04/11/2020 1528  SpO2 76 % 04/12/2020 1521  Vitals shown include unvalidated device data.  Last Pain:  Vitals:   04/16/2020 0400  TempSrc: Oral  PainSc:       Patients Stated Pain Goal: 2 (26/71/24 5809)  Complications: No complications documented.

## 2020-04-30 NOTE — H&P (Signed)
History and Physical Interval Note:  04/10/2020 7:29 AM  Jonathan Shaffer  has presented today for surgery, with the diagnosis of Mitral Valve Endocarditis.  The various methods of treatment have been discussed with the patient and family. After consideration of risks, benefits and other options for treatment, the patient has consented to  Procedure(s): MITRAL VALVE (MV) REPLACEMENT, POSSIBLE PERMANT EPICARDIAL PACEMAKING SYSTEM IMPLANT (N/A) as a surgical intervention.  The patient's history has been reviewed, patient examined, no change in status, stable for surgery.  I have reviewed the patient's chart and labs.  Questions were answered to the patient's satisfaction.     Wonda Olds

## 2020-04-30 NOTE — Progress Notes (Addendum)
Error

## 2020-04-30 NOTE — Brief Op Note (Signed)
04/17/2020 - 04/09/2020  12:50 PM  PATIENT:  Jonathan Shaffer  54 y.o. male  PRE-OPERATIVE DIAGNOSIS:  Mitral Valve Endocarditis  POST-OPERATIVE DIAGNOSIS:  Mitral Valve Endocarditis  PROCEDURE:  Procedure(s):  MEDIAN STERNOTOMY  DEBRIDEMENT OF MITRAL VALVE ANNULAR ABSCESS  MITRAL VALVE (MV) REPLACEMENT USING ON-X MITRAL VALVE SIZE 31/33 MM  PLACEMENT OF  PERMANENT EPICARDIAL PACEMAKING SYSTEM IMPLANT (N/A)  TRANSESOPHAGEAL ECHOCARDIOGRAM (TEE) (N/A)  SURGEON:  Surgeon(s) and Role:    * Wonda Olds, MD - Primary  PHYSICIAN ASSISTANT: Ellwood Handler PA-C  ANESTHESIA:   general  EBL:  Per anes record   BLOOD ADMINISTERED: CELLSAVER  DRAINS: Right pleural chest tube, mediastinal chest drains   LOCAL MEDICATIONS USED:  NONE  SPECIMEN:  Source of Specimen:  mitral valve vegetation  DISPOSITION OF SPECIMEN:  microbiology  COUNTS:  YES  TOURNIQUET:  * No tourniquets in log *  DICTATION: .Dragon Dictation  PLAN OF CARE: Admit to inpatient   PATIENT DISPOSITION:  ICU - intubated and hemodynamically stable.   Delay start of Pharmacological VTE agent (>24hrs) due to surgical blood loss or risk of bleeding: yes

## 2020-04-30 NOTE — Anesthesia Procedure Notes (Signed)
Central Venous Catheter Insertion Performed by: Oleta Mouse, MD, anesthesiologist Start/End11/21/2021 9:05 AM, 04/30/2020 9:10 AM Patient location: OR. Preanesthetic checklist: patient identified, IV checked, site marked, risks and benefits discussed, surgical consent, monitors and equipment checked, pre-op evaluation, timeout performed and anesthesia consent Hand hygiene performed  and maximum sterile barriers used  PA cath was placed.Swan type:thermodilution Procedure performed without using ultrasound guided technique. Attempts: 1 Patient tolerated the procedure well with no immediate complications.

## 2020-04-30 NOTE — Anesthesia Procedure Notes (Signed)
Central Venous Catheter Insertion Performed by: Oleta Mouse, MD, anesthesiologist Start/End11/28/2021 8:55 AM, 04/18/2020 9:05 AM Patient location: OR. Preanesthetic checklist: patient identified, IV checked, site marked, risks and benefits discussed, surgical consent, monitors and equipment checked, pre-op evaluation, timeout performed and anesthesia consent Lidocaine 1% used for infiltration and patient sedated Hand hygiene performed  and maximum sterile barriers used  Catheter size: 9 Fr Central line was placed.MAC introducer Procedure performed using ultrasound guided technique. Ultrasound Notes:anatomy identified, needle tip was noted to be adjacent to the nerve/plexus identified, no ultrasound evidence of intravascular and/or intraneural injection and image(s) printed for medical record Attempts: 1 Following insertion, line sutured, dressing applied and Biopatch. Post procedure assessment: blood return through all ports, free fluid flow and no air  Patient tolerated the procedure well with no immediate complications.

## 2020-04-30 NOTE — Anesthesia Procedure Notes (Signed)
Procedure Name: Intubation Date/Time: 04/24/2020 8:56 AM Performed by: Moshe Salisbury, CRNA Pre-anesthesia Checklist: Patient identified, Emergency Drugs available, Suction available and Patient being monitored Patient Re-evaluated:Patient Re-evaluated prior to induction Oxygen Delivery Method: Circle System Utilized Preoxygenation: Pre-oxygenation with 100% oxygen Induction Type: IV induction Ventilation: Mask ventilation without difficulty Laryngoscope Size: Mac and 4 Grade View: Grade III Tube type: Oral Number of attempts: 1 Airway Equipment and Method: Stylet Placement Confirmation: ETT inserted through vocal cords under direct vision,  positive ETCO2 and breath sounds checked- equal and bilateral Secured at: 24 cm Tube secured with: Tape Dental Injury: Teeth and Oropharynx as per pre-operative assessment

## 2020-04-30 NOTE — Anesthesia Procedure Notes (Signed)
Arterial Line Insertion Start/End11/14/2021 7:40 AM, 04/30/2020 7:50 AM Performed by: Moshe Salisbury, CRNA, CRNA  Patient location: Pre-op. Preanesthetic checklist: patient identified, IV checked, site marked, risks and benefits discussed, surgical consent, monitors and equipment checked, pre-op evaluation, timeout performed and anesthesia consent Lidocaine 1% used for infiltration and patient sedated Left, radial was placed Catheter size: 20 G Hand hygiene performed , maximum sterile barriers used  and Seldinger technique used Allen's test indicative of satisfactory collateral circulation Attempts: 1 Procedure performed without using ultrasound guided technique. Following insertion, dressing applied and Biopatch. Post procedure assessment: normal  Patient tolerated the procedure well with no immediate complications.

## 2020-04-30 NOTE — Anesthesia Preprocedure Evaluation (Addendum)
Anesthesia Evaluation  Patient identified by MRN, date of birth, ID band Patient awake    Reviewed: Allergy & Precautions, NPO status , Patient's Chart, lab work & pertinent test results, reviewed documented beta blocker date and time   History of Anesthesia Complications Negative for: history of anesthetic complications  Airway Mallampati: II  TM Distance: >3 FB Neck ROM: Full    Dental  (+) Teeth Intact, Dental Advisory Given   Pulmonary  Covid-19 Nucleic Acid Test Results Lab Results      Component                Value               Date                      [place]              NEGATIVE            04/21/2020              breath sounds clear to auscultation       Cardiovascular  Rhythm:Regular   Prox Cx lesion is 25% stenosed.  Mid LAD lesion is 10% stenosed.  LV end diastolic pressure is moderately elevated. LVEDP 29 mm Hg.  There is no aortic valve stenosis.  Hemodynamic findings consistent with mild pulmonary hypertension.  Ao sat 98%, PA sat 68%, PA pressure 41/20, mean PA 31 mm Hg; mean PCWP 28 mm Hg; CO 8.1 L/min; CI 3.2   1. There is a large echodensity that is partially fixed (below the valve  on the ventricular side and possibly representing an abscess) and  partially hypermobile, multilobar ( on the left atrial side) consistent  with a vegetation with high risk for  embolization. This involves P2,3 and A3 leaflets. Vegetation size is 4.4 x  2.4 cm.  2. Left ventricular ejection fraction, by estimation, is 55 to 60%. The  left ventricle has normal function. The left ventricle has no regional  wall motion abnormalities. Left ventricular diastolic function could not  be evaluated.  3. Right ventricular systolic function is normal. The right ventricular  size is normal.  4. No left atrial/left atrial appendage thrombus was detected.  5. The mitral valve is normal in structure. Mild mitral valve   regurgitation. No evidence of mitral stenosis.  6. The aortic valve is normal in structure. Aortic valve regurgitation is  not visualized. No aortic stenosis is present.  7. The inferior vena cava is normal in size with greater than 50%  respiratory variability, suggesting right atrial pressure of 3 mmHg.    Neuro/Psych negative neurological ROS  negative psych ROS   GI/Hepatic negative GI ROS, Neg liver ROS,   Endo/Other  diabetes, Poorly Controlled, Type 2  Renal/GU Renal diseaseLab Results      Component                Value               Date                      CREATININE               1.20                04/11/2020           Lab Results      Component  Value               Date                      K                        3.5                 04/11/2020                Musculoskeletal   Abdominal   Peds  Hematology  (+) Blood dyscrasia, anemia , Lab Results      Component                Value               Date                      WBC                      20.6 (H)            04/13/2020                HGB                      9.5 (L)             04/20/2020                HCT                      29.4 (L)            04/11/2020                MCV                      91.0                04/06/2020                PLT                      542 (H)             04/15/2020              Anesthesia Other Findings   Reproductive/Obstetrics                           Anesthesia Physical Anesthesia Plan  ASA: IV  Anesthesia Plan: General   Post-op Pain Management:    Induction: Intravenous  PONV Risk Score and Plan: 2 and Ondansetron and Midazolam  Airway Management Planned: Oral ETT  Additional Equipment: Arterial line, CVP, TEE and Ultrasound Guidance Line Placement  Intra-op Plan:   Post-operative Plan: Possible Post-op intubation/ventilation  Informed Consent: I have reviewed the patients History and Physical, chart,  labs and discussed the procedure including the risks, benefits and alternatives for the proposed anesthesia with the patient or authorized representative who has indicated his/her understanding and acceptance.     Dental advisory given  Plan Discussed with: CRNA, Surgeon and Anesthesiologist  Anesthesia Plan Comments:        Anesthesia Quick Evaluation

## 2020-05-01 ENCOUNTER — Inpatient Hospital Stay (HOSPITAL_COMMUNITY): Payer: BC Managed Care – PPO

## 2020-05-01 ENCOUNTER — Encounter (HOSPITAL_COMMUNITY): Payer: Self-pay | Admitting: Family Medicine

## 2020-05-01 DIAGNOSIS — Z9889 Other specified postprocedural states: Secondary | ICD-10-CM

## 2020-05-01 LAB — BASIC METABOLIC PANEL
Anion gap: 14 (ref 5–15)
Anion gap: 14 (ref 5–15)
BUN: 14 mg/dL (ref 6–20)
BUN: 15 mg/dL (ref 6–20)
CO2: 20 mmol/L — ABNORMAL LOW (ref 22–32)
CO2: 21 mmol/L — ABNORMAL LOW (ref 22–32)
Calcium: 7.3 mg/dL — ABNORMAL LOW (ref 8.9–10.3)
Calcium: 7.1 mg/dL — ABNORMAL LOW (ref 8.9–10.3)
Chloride: 104 mmol/L (ref 98–111)
Chloride: 102 mmol/L (ref 98–111)
Creatinine, Ser: 1.67 mg/dL — ABNORMAL HIGH (ref 0.61–1.24)
Creatinine, Ser: 1.79 mg/dL — ABNORMAL HIGH (ref 0.61–1.24)
GFR, Estimated: 48 mL/min — ABNORMAL LOW (ref >60)
GFR, Estimated: 44 mL/min — ABNORMAL LOW (ref >60)
Glucose, Bld: 134 mg/dL — ABNORMAL HIGH (ref 70–99)
Glucose, Bld: 137 mg/dL — ABNORMAL HIGH (ref 70–99)
Potassium: 5 mmol/L (ref 3.5–5.1)
Potassium: 4.9 mmol/L (ref 3.5–5.1)
Sodium: 138 mmol/L (ref 135–145)
Sodium: 137 mmol/L (ref 135–145)

## 2020-05-01 LAB — GLUCOSE, CAPILLARY
Glucose-Capillary: 105 mg/dL — ABNORMAL HIGH (ref 70–99)
Glucose-Capillary: 113 mg/dL — ABNORMAL HIGH (ref 70–99)
Glucose-Capillary: 119 mg/dL — ABNORMAL HIGH (ref 70–99)
Glucose-Capillary: 121 mg/dL — ABNORMAL HIGH (ref 70–99)
Glucose-Capillary: 122 mg/dL — ABNORMAL HIGH (ref 70–99)
Glucose-Capillary: 123 mg/dL — ABNORMAL HIGH (ref 70–99)
Glucose-Capillary: 124 mg/dL — ABNORMAL HIGH (ref 70–99)
Glucose-Capillary: 126 mg/dL — ABNORMAL HIGH (ref 70–99)
Glucose-Capillary: 126 mg/dL — ABNORMAL HIGH (ref 70–99)
Glucose-Capillary: 127 mg/dL — ABNORMAL HIGH (ref 70–99)
Glucose-Capillary: 129 mg/dL — ABNORMAL HIGH (ref 70–99)
Glucose-Capillary: 136 mg/dL — ABNORMAL HIGH (ref 70–99)
Glucose-Capillary: 145 mg/dL — ABNORMAL HIGH (ref 70–99)
Glucose-Capillary: 145 mg/dL — ABNORMAL HIGH (ref 70–99)
Glucose-Capillary: 147 mg/dL — ABNORMAL HIGH (ref 70–99)
Glucose-Capillary: 152 mg/dL — ABNORMAL HIGH (ref 70–99)
Glucose-Capillary: 154 mg/dL — ABNORMAL HIGH (ref 70–99)

## 2020-05-01 LAB — PREPARE PLATELET PHERESIS: Unit division: 0

## 2020-05-01 LAB — BPAM CRYOPRECIPITATE
Blood Product Expiration Date: 202111271547
ISSUE DATE / TIME: 202111271003
Unit Type and Rh: 5100

## 2020-05-01 LAB — AEROBIC/ANAEROBIC CULTURE W GRAM STAIN (SURGICAL/DEEP WOUND)

## 2020-05-01 LAB — CBC
HCT: 22.9 % — ABNORMAL LOW (ref 39.0–52.0)
HCT: 27 % — ABNORMAL LOW (ref 39.0–52.0)
Hemoglobin: 7.5 g/dL — ABNORMAL LOW (ref 13.0–17.0)
Hemoglobin: 8.9 g/dL — ABNORMAL LOW (ref 13.0–17.0)
MCH: 30.6 pg (ref 26.0–34.0)
MCH: 30 pg (ref 26.0–34.0)
MCHC: 32.8 g/dL (ref 30.0–36.0)
MCHC: 33 g/dL (ref 30.0–36.0)
MCV: 93.5 fL (ref 80.0–100.0)
MCV: 90.9 fL (ref 80.0–100.0)
Platelets: 306 10*3/uL (ref 150–400)
Platelets: 242 10*3/uL (ref 150–400)
RBC: 2.45 MIL/uL — ABNORMAL LOW (ref 4.22–5.81)
RBC: 2.97 MIL/uL — ABNORMAL LOW (ref 4.22–5.81)
RDW: 15.2 % (ref 11.5–15.5)
RDW: 15.7 % — ABNORMAL HIGH (ref 11.5–15.5)
WBC: 24.8 10*3/uL — ABNORMAL HIGH (ref 4.0–10.5)
WBC: 19.9 10*3/uL — ABNORMAL HIGH (ref 4.0–10.5)
nRBC: 0 % (ref 0.0–0.2)
nRBC: 0.1 % (ref 0.0–0.2)

## 2020-05-01 LAB — BPAM FFP
Blood Product Expiration Date: 202112012359
Blood Product Expiration Date: 202112012359
Blood Product Expiration Date: 202112022359
Blood Product Expiration Date: 202112022359
Blood Product Expiration Date: 202112022359
ISSUE DATE / TIME: 202111270956
ISSUE DATE / TIME: 202111270956
ISSUE DATE / TIME: 202111271422
ISSUE DATE / TIME: 202111271422
ISSUE DATE / TIME: 202111271422
Unit Type and Rh: 6200
Unit Type and Rh: 6200
Unit Type and Rh: 600
Unit Type and Rh: 600
Unit Type and Rh: 6200

## 2020-05-01 LAB — PREPARE FRESH FROZEN PLASMA
Unit division: 0
Unit division: 0
Unit division: 0

## 2020-05-01 LAB — COMPREHENSIVE METABOLIC PANEL
ALT: 984 U/L — ABNORMAL HIGH (ref 0–44)
AST: 3257 U/L — ABNORMAL HIGH (ref 15–41)
Albumin: 1.8 g/dL — ABNORMAL LOW (ref 3.5–5.0)
Alkaline Phosphatase: 123 U/L (ref 38–126)
Anion gap: 13 (ref 5–15)
BUN: 19 mg/dL (ref 6–20)
CO2: 21 mmol/L — ABNORMAL LOW (ref 22–32)
Calcium: 6.8 mg/dL — ABNORMAL LOW (ref 8.9–10.3)
Chloride: 106 mmol/L (ref 98–111)
Creatinine, Ser: 2.02 mg/dL — ABNORMAL HIGH (ref 0.61–1.24)
GFR, Estimated: 38 mL/min — ABNORMAL LOW (ref >60)
Glucose, Bld: 111 mg/dL — ABNORMAL HIGH (ref 70–99)
Potassium: 4.1 mmol/L (ref 3.5–5.1)
Sodium: 140 mmol/L (ref 135–145)
Total Bilirubin: 2.6 mg/dL — ABNORMAL HIGH (ref 0.3–1.2)
Total Protein: 5.4 g/dL — ABNORMAL LOW (ref 6.5–8.1)

## 2020-05-01 LAB — CULTURE, BLOOD (ROUTINE X 2)
Culture: NO GROWTH
Culture: NO GROWTH

## 2020-05-01 LAB — MAGNESIUM
Magnesium: 2.9 mg/dL — ABNORMAL HIGH (ref 1.7–2.4)
Magnesium: 2.8 mg/dL — ABNORMAL HIGH (ref 1.7–2.4)
Magnesium: 2.6 mg/dL — ABNORMAL HIGH (ref 1.7–2.4)

## 2020-05-01 LAB — PREPARE CRYOPRECIPITATE: Unit division: 0

## 2020-05-01 LAB — BPAM PLATELET PHERESIS
Blood Product Expiration Date: 202111282359
ISSUE DATE / TIME: 202111270959
Unit Type and Rh: 9500

## 2020-05-01 LAB — PREPARE RBC (CROSSMATCH)

## 2020-05-01 IMAGING — DX DG CHEST 1V PORT
1 series · 1 of 1 positions shown · non-contrast
Comparison: [DATE]

CLINICAL DATA: Status post mitral valve replacement

EXAM:
PORTABLE CHEST 1 VIEW

[chest]
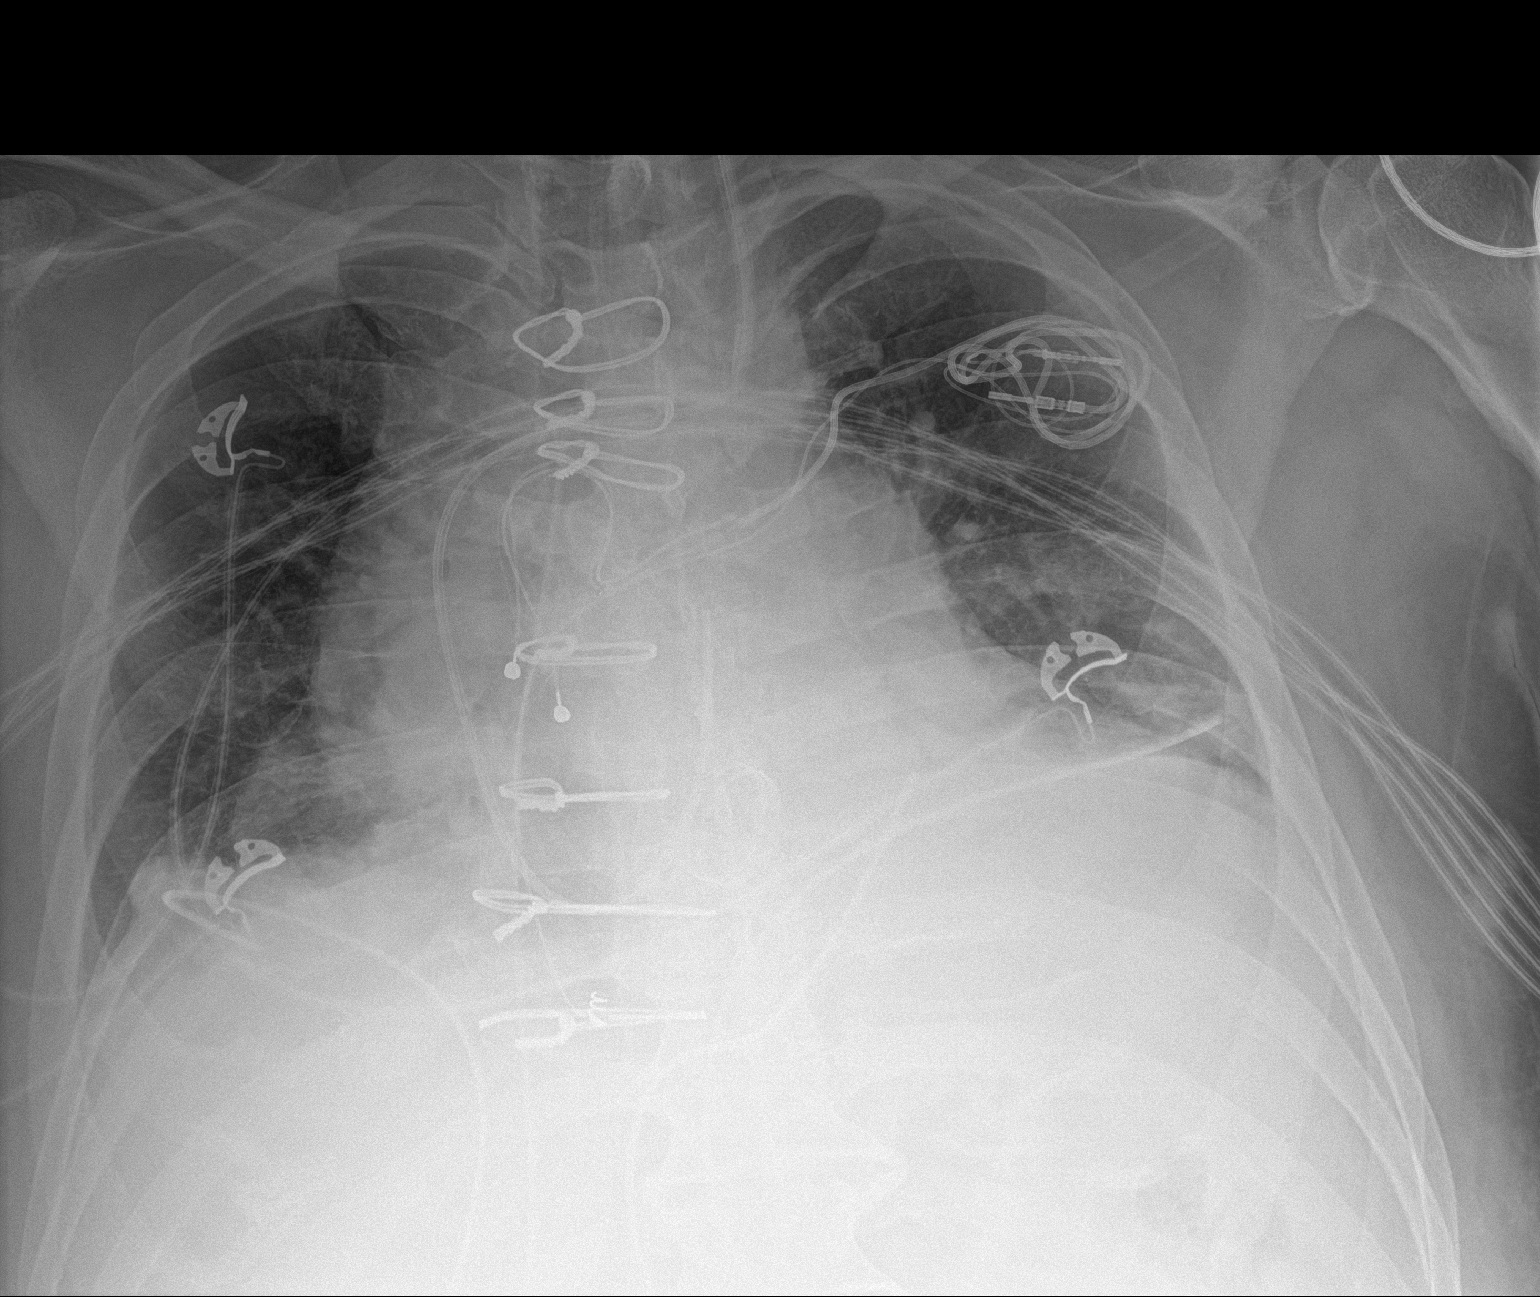

[1 of 1 positions shown; findings below may reference images not displayed]

FINDINGS: Endotracheal tube and nasogastric tube have been removed. Swan-Ganz
catheter tip is in the main pulmonary outflow tract. Left chest tube
and mediastinal drains unchanged in position. Pacemaker wires remain
with pacemaker device no longer present. Status post mitral valve
replacement. No pneumothorax. There is bibasilar atelectasis. Lungs
otherwise clear. Heart is enlarged, stable, with pulmonary
vascularity normal. No adenopathy. No bone lesions.
IMPRESSION: Tube and catheter positions as described without pneumothorax.
Bibasilar atelectasis, essentially stable. Stable cardiac
prominence.

## 2020-05-01 IMAGING — US US EXTREM LOW*L* LIMITED
1 series · 8 of 8 positions shown · non-contrast
Comparison: None.

CLINICAL DATA: Concern for abscess

EXAM:
ULTRASOUND LEFT FIRST TOE
TECHNIQUE: Longitudinal and transverse images of the left first toe obtained.

[Series 1: us left lower extrem ltd soft tissue non vascular · 8 acquisitions, 8 frames shown]
[im 1/8]
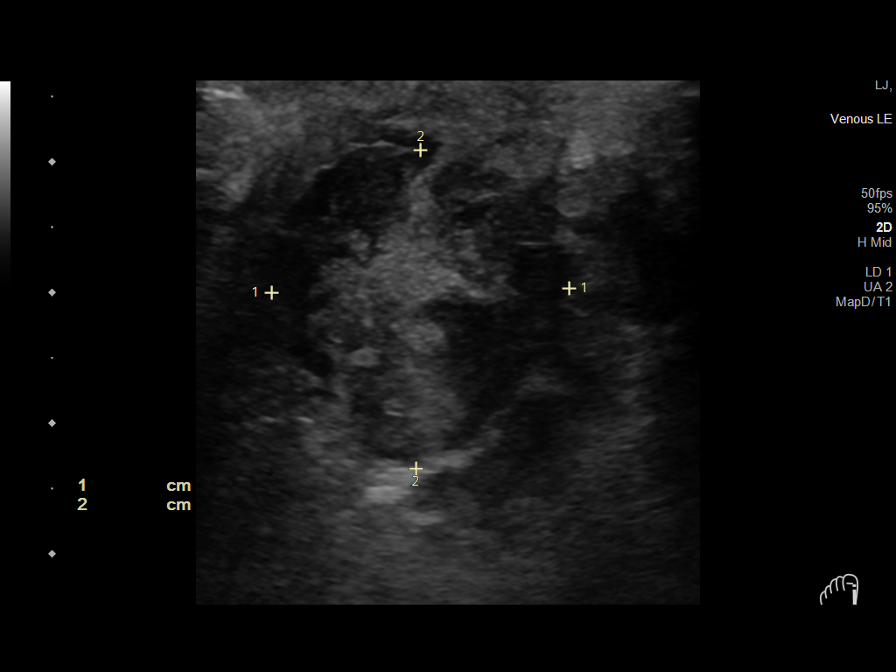
[im 2/8]
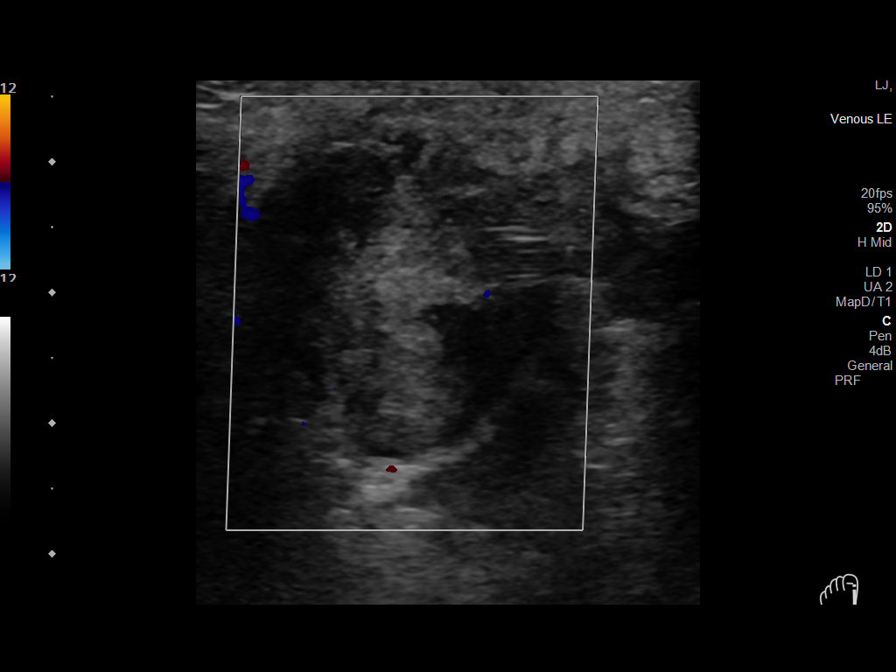
[im 3/8]
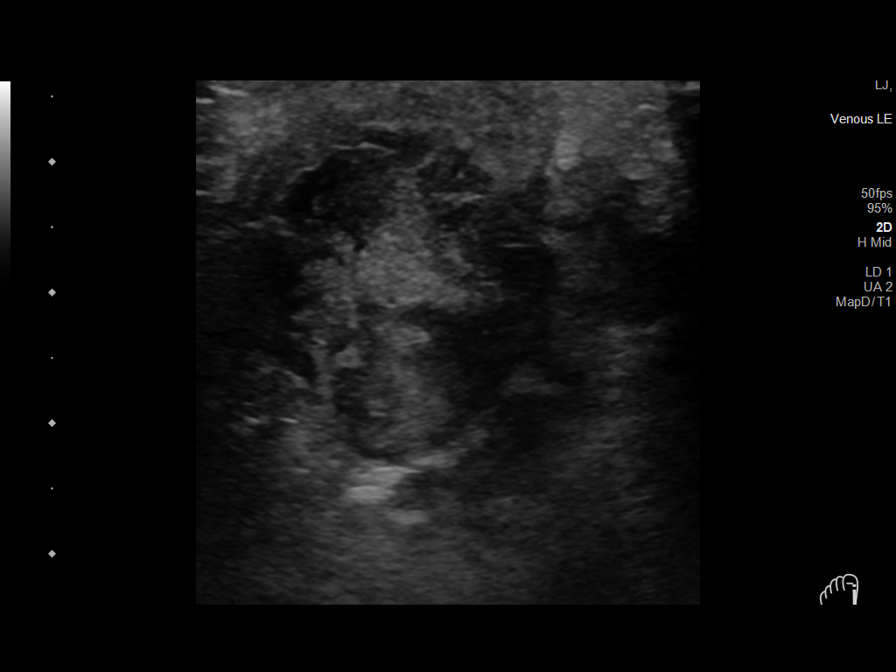
[im 4/8]
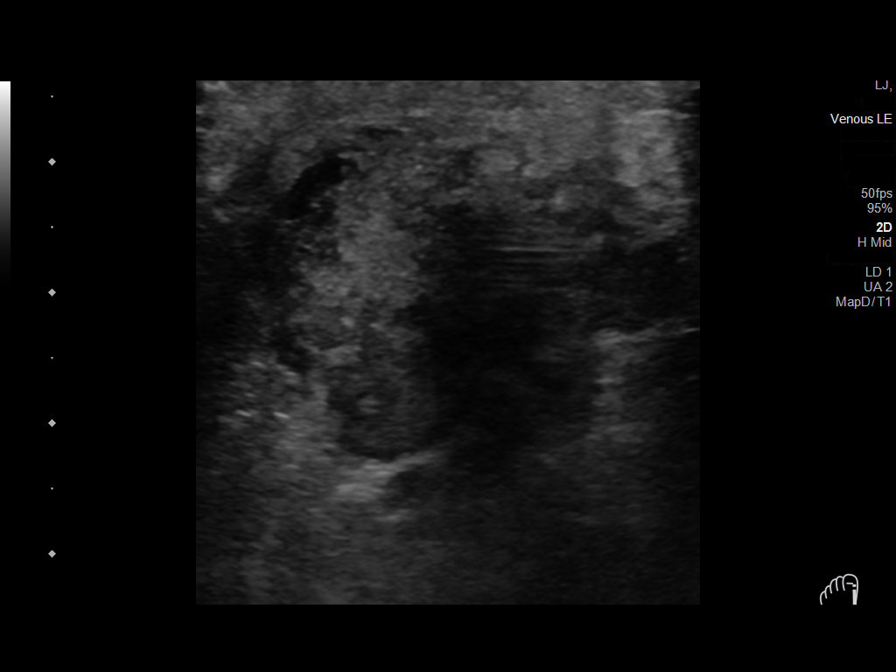
[im 5/8]
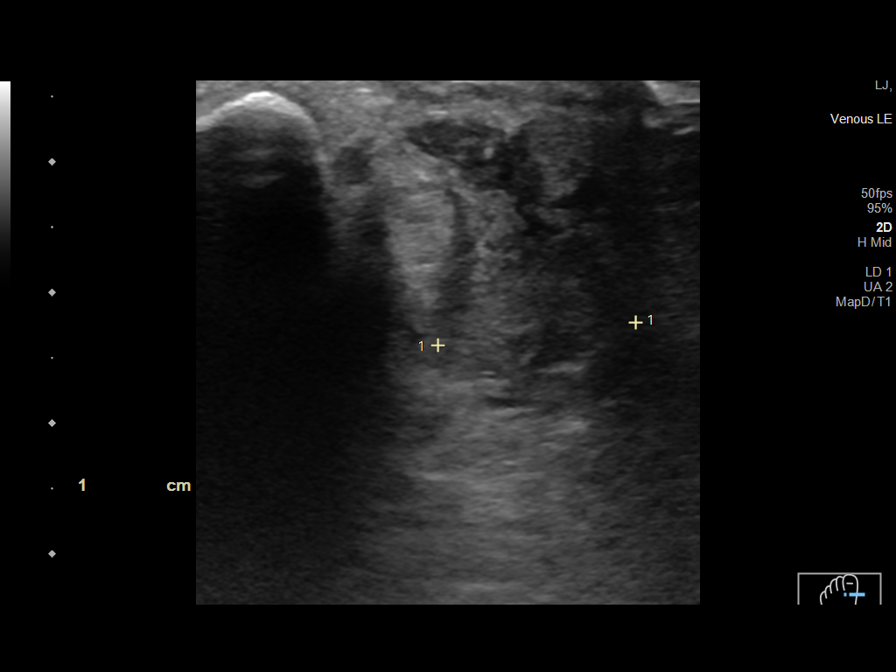
[im 6/8]
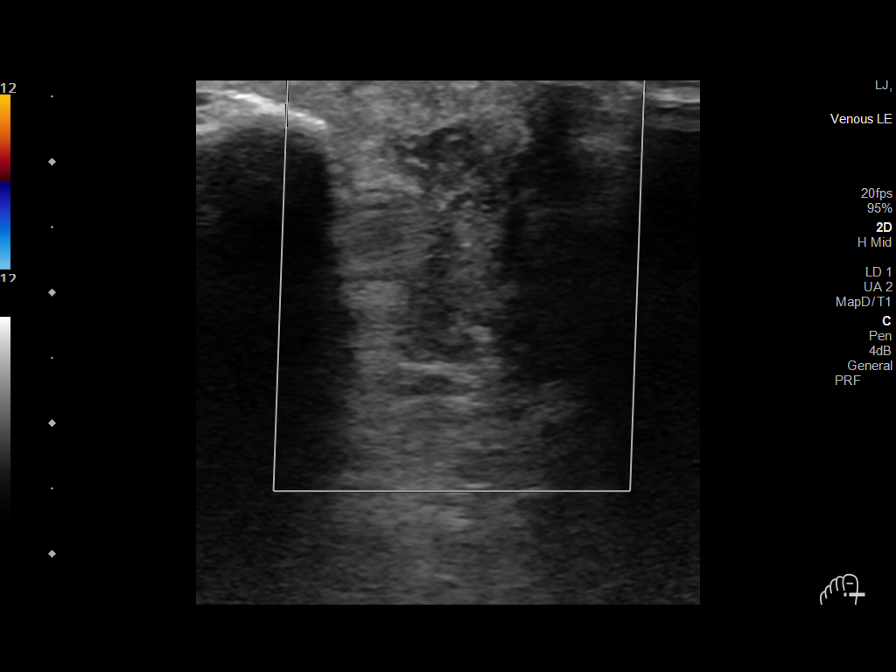
[im 7/8]
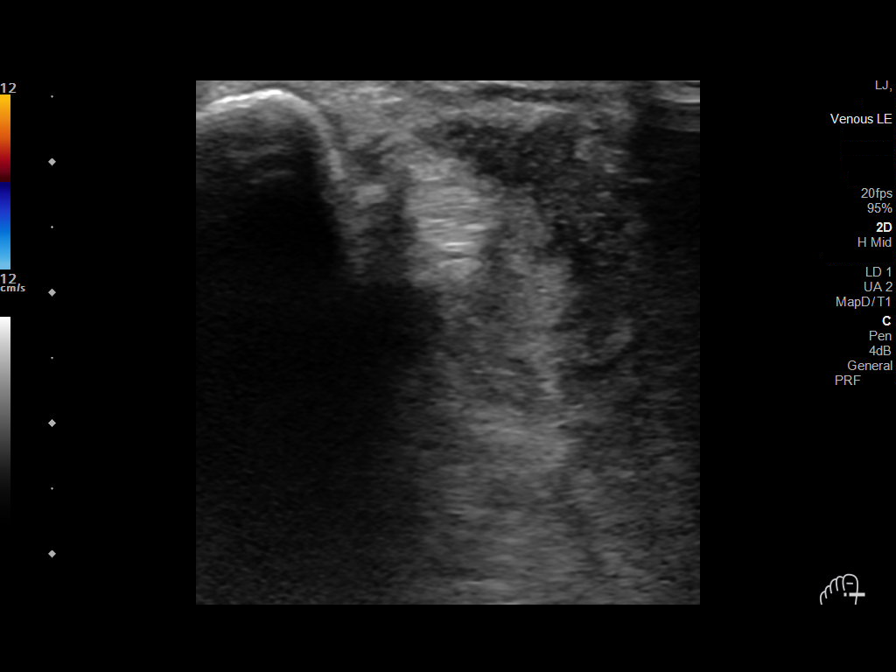
[im 8/8]
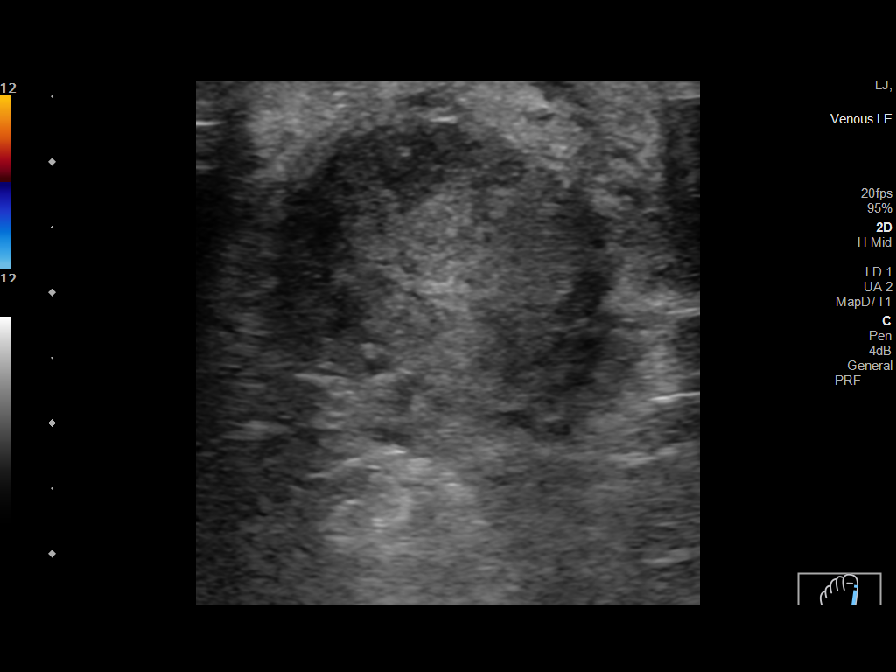

[8 of 8 positions shown; findings below may reference images not displayed]

FINDINGS: Along the medial aspect of the left first toe at site of palpable
fullness, there is a complex fluid collection measuring 2.3 x 2.4 x
1.5 cm. No other lesion evident in this area.
IMPRESSION: Apparent abscess along the medial aspect of the left first toe
measuring 2.3 x 2.4 x 1.5 cm. No other lesion evident by ultrasound
in this region.

## 2020-05-01 MED ORDER — SODIUM CHLORIDE 0.9% IV SOLUTION: Freq: Once | INTRAVENOUS | Status: DC

## 2020-05-01 MED ORDER — QUETIAPINE FUMARATE 25 MG PO TABS
25.0000 mg | ORAL_TABLET | Freq: Every evening | ORAL | Status: DC | PRN
  Administered 2020-05-01 – 2020-05-05 (×2): 25 mg via ORAL
  Filled 2020-05-01 (×3): qty 1

## 2020-05-01 MED ORDER — THIAMINE HCL 100 MG/ML IJ SOLN
Freq: Once | INTRAVENOUS | Status: AC
  Filled 2020-05-01: qty 1000

## 2020-05-01 NOTE — Progress Notes (Signed)
[facility] for Infectious Disease  Date of Admission:  04/14/2020           Current antibiotics: Nafcillin  Reason for visit: Follow up on MSSA bacteremia   ASSESSMENT AND PLAN:   Jonathan Shaffer is a 54 y.o. male with metastatic MSSA infection transferred from [facility] to [facility] for CT surgery evaluation after TEE found 4.4cm MV vegetation and perivalvular abscess.  His repeat blood cultures remain clear and he is s/p valve replacement and abscess debridement 04/19/2020  # MSSA bacteremia # MV endocarditis and perivalvular abscess s/p MV replacement and debridement of MV annular abscess 04/17/2020 # Septic emboli to CNS # Septic arthritis right AC joint and OM distal clavicle and acromion and intradeltoid abscess and subacromial bursitis s/p  I&D 05/02/2020.  Cultures = MSSA # Splenic infarct # Left foot ecchymosis, ? fluctuance  -- continue nafcillin -- blood cultures cleared 04/24/20 -- would like to pursue further imaging of left foot/toe area to rule out infection.  Currently has A-line so getting CT/MRI will be difficult this AM.  Will start with [place]   SUBJECTIVE:   Wife reports that he is more anxious.  Concerned regarding evolving ecchymosis of left great toe area.  No fevers, chills.  Pain at about a 6 right now.  Tolerating PO intake.     Review of Systems: As noted above.  All other systems reviewed and are negative.   OBJECTIVE:   No Known Allergies  Blood pressure (!) 139/93, pulse 92, temperature 98.1 F (36.7 C), temperature source Core, resp. rate (!) 37, height 6\' 3"  (1.905 m), weight (!) 137.6 kg, SpO2 94 %. Body mass index is 37.92 kg/m.  Physical Exam Constitutional:      Appearance: Normal appearance.  HENT:     Head: Normocephalic and atraumatic.  Eyes:     Extraocular Movements: Extraocular movements intact.     Conjunctiva/sclera: Conjunctivae normal.  Cardiovascular:     Rate and Rhythm: Normal rate and regular rhythm.      Comments: + click of mechanical valve. Pulmonary:     Effort: Pulmonary effort is normal. No respiratory distress.     Breath sounds: Normal breath sounds.     Comments: Currently on nasal cannula.  Abdominal:     General: Abdomen is flat. There is distension.     Palpations: Abdomen is soft.     Tenderness: There is no abdominal tenderness.  Musculoskeletal:     Right lower leg: Edema present.     Left lower leg: Edema present.     Comments: See picture from today. Left big toe is dusky appearing and cooler to touch.  Questionable fluctuance.   Skin:    General: Skin is warm and dry.  Neurological:     General: No focal deficit present.     Mental Status: He is alert and oriented to person, place, and time.  Psychiatric:     Comments: Anxious appearing.                Lab Results & Microbiology Lab Results  Component Value Date   WBC 24.8 (H) 05/01/2020   HGB 7.5 (L) 05/01/2020   HCT 22.9 (L) 05/01/2020   MCV 93.5 05/01/2020   PLT 306 05/01/2020    Lab Results  Component Value Date   NA 137 05/01/2020   K 4.9 05/01/2020   CO2 21 (L) 05/01/2020   GLUCOSE 137 (H) 05/01/2020   BUN 15  05/01/2020   CREATININE 1.79 (H) 05/01/2020   CALCIUM 7.1 (L) 05/01/2020   GFRNONAA 44 (L) 05/01/2020    Lab Results  Component Value Date   ALT 14 04/12/2020   AST 22 04/09/2020   ALKPHOS 85 04/07/2020   BILITOT 1.4 (H) 04/05/2020     I have reviewed the micro and lab results in Epic.  Imaging DG Chest Port 1 View  Result Date: 05/01/2020 CLINICAL DATA:  Status post mitral valve replacement EXAM: PORTABLE CHEST 1 VIEW COMPARISON:  April 30, 2020 FINDINGS: Endotracheal tube and nasogastric tube have been removed. Swan-Ganz catheter tip is in the main pulmonary outflow tract. Left chest tube and mediastinal drains unchanged in position. Pacemaker wires remain with pacemaker device no longer present. Status post mitral valve replacement. No pneumothorax. There is  bibasilar atelectasis. Lungs otherwise clear. Heart is enlarged, stable, with pulmonary vascularity normal. No adenopathy. No bone lesions. IMPRESSION: Tube and catheter positions as described without pneumothorax. Bibasilar atelectasis, essentially stable. Stable cardiac prominence. Electronically Signed   By: Lowella Grip III M.D.   On: 05/01/2020 07:55   DG Chest Port 1 View  Result Date: 04/04/2020 CLINICAL DATA:  Status post mitral valve repair. EXAM: PORTABLE CHEST 1 VIEW COMPARISON:  April 22, 2020 FINDINGS: Post intubation, satisfactory position of the endotracheal tube. Swan-Ganz catheter terminates at the expected location of the pulmonary outflow tract. Artificial mitral valve noted. Mediastinal and chest drains in place. Enteric catheter in place. Cardiomediastinal silhouette is normal. Mediastinal contours appear intact. There is no evidence of focal airspace consolidation, pleural effusion or pneumothorax. Low lung volumes. Osseous structures are without acute abnormality. Soft tissues are grossly normal. IMPRESSION: 1. Post intubation, satisfactory position of the endotracheal tube. 2. Low lung volumes. Electronically Signed   By: Fidela Salisbury M.D.   On: 04/09/2020 16:06      [facility] [phone] pager 05/01/2020, 10:43 AM

## 2020-05-01 NOTE — Progress Notes (Signed)
Overnight Progress Note:  Weaned off Levo, weaned Vaso to 0.03. Epi still at 87mcg/min. CI>2. AAI@92 .  Insulin maintained at 1.2u/hr most of night.   Morning labs WDL. WBC trending down, Hg 7.5. CT output has slowed down. UOP stable ~20cc/hr.  Totals: PTs = 410cc MTs = 310cc UOP = 305cc  Overall stable night.

## 2020-05-01 NOTE — Progress Notes (Signed)
1 Day Post-Op Procedure(s) (LRB): MITRAL VALVE (MV) REPLACEMENT USING ON-X MITRAL VALVE SIZE 31/33 MM,  PERMANENT EPICARDIAL PACEMAKING SYSTEM IMPLANT USING MEDTRONIC LEADS (N/A) TRANSESOPHAGEAL ECHOCARDIOGRAM (TEE) (N/A) Subjective: thirsty  Objective: Vital signs in last 24 hours: Temp:  [97.9 F (36.6 C)-99.9 F (37.7 C)] 97.9 F (36.6 C) (11/28 1400) Pulse Rate:  [89-101] 92 (11/28 1400) Cardiac Rhythm: Atrial paced (11/28 1200) Resp:  [17-38] 22 (11/28 1400) BP: (97-145)/(47-99) 126/91 (11/28 1400) SpO2:  [86 %-100 %] 93 % (11/28 1400) Arterial Line BP: (82-231)/(46-227) 231/227 (11/28 1400) FiO2 (%):  [40 %] 40 % (11/27 2022) Weight:  [137.6 kg] 137.6 kg (11/28 0500)  Hemodynamic parameters for last 24 hours: PAP: (25-50)/(8-30) 32/17 CO:  [4.3 L/min-6.3 L/min] 5.8 L/min CI:  [1.7 L/min/m2-2.5 L/min/m2] 2.3 L/min/m2  Intake/Output from previous day: 11/27 0701 - 11/28 0700 In: 10938.3 [P.O.:660; I.V.:5958.4; VVZSM:2707; IV Piggyback:1856.9] Out: 3404 [Urine:1490; Blood:750; Chest Tube:1164] Intake/Output this shift: Total I/O In: 1350.3 [I.V.:672.2; Blood:678.2] Out: 333 [Urine:213; Chest Tube:120]  General appearance: alert and cooperative Neurologic: intact Heart: regular rate and rhythm, S1, S2 normal, no murmur, click, rub or gallop Lungs: clear to auscultation bilaterally Abdomen: soft, non-tender; bowel sounds normal; no masses,  no organomegaly Extremities: edema mild Wound: dressed, dry  Lab Results: Recent Labs    05/01/20 0350 05/01/20 1431  WBC 24.8* 19.9*  HGB 7.5* 8.9*  HCT 22.9* 27.0*  PLT 306 242   BMET:  Recent Labs    04/29/2020 2255 05/01/20 0350  NA 138 137  K 5.0 4.9  CL 104 102  CO2 20* 21*  GLUCOSE 134* 137*  BUN 14 15  CREATININE 1.67* 1.79*  CALCIUM 7.3* 7.1*    PT/INR:  Recent Labs    04/26/2020 1541  LABPROT 18.9*  INR 1.7*   ABG    Component Value Date/Time   PHART 7.419 04/10/2020 1823   HCO3 18.8 (L)  04/12/2020 1823   TCO2 20 (L) 04/13/2020 1823   ACIDBASEDEF 5.0 (H) 04/11/2020 1823   O2SAT 97.0 05/01/2020 1823   CBG (last 3)  Recent Labs    05/01/20 1216 05/01/20 1317 05/01/20 1424  GLUCAP 152* 145* 136*    Assessment/Plan: S/P Procedure(s) (LRB): MITRAL VALVE (MV) REPLACEMENT USING ON-X MITRAL VALVE SIZE 31/33 MM,  PERMANENT EPICARDIAL PACEMAKING SYSTEM IMPLANT USING MEDTRONIC LEADS (N/A) TRANSESOPHAGEAL ECHOCARDIOGRAM (TEE) (N/A) continue to resuscitate  Wean drips slowly Ok to remove PA cath   LOS: 9 days    Jonathan Shaffer 05/01/2020

## 2020-05-01 NOTE — Progress Notes (Signed)
RN took pt off of BIPAP around 2230 d/t agitation/anxiety. Pt was place on [place] 4 Lpm. RT will continue to monitor.

## 2020-05-01 NOTE — Progress Notes (Signed)
RT at bedside w/RN. Pt is on 4LNC tolerating well with SVS. RN stated that pt only needed BiPAP for a short time and has maintained being on 4LNC. RT will continue to monitor pt.

## 2020-05-02 ENCOUNTER — Inpatient Hospital Stay (HOSPITAL_COMMUNITY): Payer: BC Managed Care – PPO

## 2020-05-02 ENCOUNTER — Encounter (HOSPITAL_COMMUNITY): Payer: Self-pay | Admitting: Interventional Cardiology

## 2020-05-02 DIAGNOSIS — Z9889 Other specified postprocedural states: Secondary | ICD-10-CM | POA: Diagnosis not present

## 2020-05-02 DIAGNOSIS — J9601 Acute respiratory failure with hypoxia: Secondary | ICD-10-CM | POA: Diagnosis not present

## 2020-05-02 DIAGNOSIS — I743 Embolism and thrombosis of arteries of the lower extremities: Secondary | ICD-10-CM

## 2020-05-02 DIAGNOSIS — I872 Venous insufficiency (chronic) (peripheral): Secondary | ICD-10-CM | POA: Diagnosis not present

## 2020-05-02 DIAGNOSIS — R7881 Bacteremia: Secondary | ICD-10-CM | POA: Diagnosis not present

## 2020-05-02 DIAGNOSIS — R739 Hyperglycemia, unspecified: Secondary | ICD-10-CM | POA: Diagnosis not present

## 2020-05-02 DIAGNOSIS — N179 Acute kidney failure, unspecified: Secondary | ICD-10-CM | POA: Diagnosis not present

## 2020-05-02 LAB — GLUCOSE, CAPILLARY
Glucose-Capillary: 116 mg/dL — ABNORMAL HIGH (ref 70–99)
Glucose-Capillary: 119 mg/dL — ABNORMAL HIGH (ref 70–99)
Glucose-Capillary: 119 mg/dL — ABNORMAL HIGH (ref 70–99)
Glucose-Capillary: 120 mg/dL — ABNORMAL HIGH (ref 70–99)
Glucose-Capillary: 121 mg/dL — ABNORMAL HIGH (ref 70–99)
Glucose-Capillary: 130 mg/dL — ABNORMAL HIGH (ref 70–99)
Glucose-Capillary: 133 mg/dL — ABNORMAL HIGH (ref 70–99)
Glucose-Capillary: 148 mg/dL — ABNORMAL HIGH (ref 70–99)

## 2020-05-02 LAB — BASIC METABOLIC PANEL
Anion gap: 12 (ref 5–15)
BUN: 25 mg/dL — ABNORMAL HIGH (ref 6–20)
CO2: 22 mmol/L (ref 22–32)
Calcium: 7.3 mg/dL — ABNORMAL LOW (ref 8.9–10.3)
Chloride: 101 mmol/L (ref 98–111)
Creatinine, Ser: 2.46 mg/dL — ABNORMAL HIGH (ref 0.61–1.24)
GFR, Estimated: 30 mL/min — ABNORMAL LOW (ref >60)
Glucose, Bld: 128 mg/dL — ABNORMAL HIGH (ref 70–99)
Potassium: 4.5 mmol/L (ref 3.5–5.1)
Sodium: 135 mmol/L (ref 135–145)

## 2020-05-02 LAB — DIFFERENTIAL
Abs Immature Granulocytes: 0.4 10*3/uL — ABNORMAL HIGH (ref 0.00–0.07)
Basophils Absolute: 0.1 10*3/uL (ref 0.0–0.1)
Basophils Relative: 0 %
Eosinophils Absolute: 0 10*3/uL (ref 0.0–0.5)
Eosinophils Relative: 0 %
Immature Granulocytes: 2 %
Lymphocytes Relative: 7 %
Lymphs Abs: 1.7 10*3/uL (ref 0.7–4.0)
Monocytes Absolute: 0.7 10*3/uL (ref 0.1–1.0)
Monocytes Relative: 3 %
Neutro Abs: 22.9 10*3/uL — ABNORMAL HIGH (ref 1.7–7.7)
Neutrophils Relative %: 88 %
Smear Review: NORMAL

## 2020-05-02 LAB — TYPE AND SCREEN
ABO/RH(D): O POS
Antibody Screen: NEGATIVE
Unit division: 0
Unit division: 0
Unit division: 0
Unit division: 0

## 2020-05-02 LAB — CBC
HCT: 29.1 % — ABNORMAL LOW (ref 39.0–52.0)
Hemoglobin: 9.7 g/dL — ABNORMAL LOW (ref 13.0–17.0)
MCH: 30.5 pg (ref 26.0–34.0)
MCHC: 33.3 g/dL (ref 30.0–36.0)
MCV: 91.5 fL (ref 80.0–100.0)
Platelets: 279 10*3/uL (ref 150–400)
RBC: 3.18 MIL/uL — ABNORMAL LOW (ref 4.22–5.81)
RDW: 16.2 % — ABNORMAL HIGH (ref 11.5–15.5)
WBC: 26.2 10*3/uL — ABNORMAL HIGH (ref 4.0–10.5)
nRBC: 0.2 % (ref 0.0–0.2)

## 2020-05-02 LAB — BPAM RBC
Blood Product Expiration Date: 202112252359
Blood Product Expiration Date: 202112262359
Blood Product Expiration Date: 202112282359
Blood Product Expiration Date: 202112282359
ISSUE DATE / TIME: 202111271653
ISSUE DATE / TIME: 202111271653
ISSUE DATE / TIME: 202111280807
ISSUE DATE / TIME: 202111280807
Unit Type and Rh: 5100
Unit Type and Rh: 5100
Unit Type and Rh: 5100
Unit Type and Rh: 5100

## 2020-05-02 LAB — CREATININE, URINE, RANDOM: Creatinine, Urine: 71.78 mg/dL

## 2020-05-02 LAB — MAGNESIUM: Magnesium: 2.8 mg/dL — ABNORMAL HIGH (ref 1.7–2.4)

## 2020-05-02 LAB — SODIUM, URINE, RANDOM: Sodium, Ur: 33 mmol/L

## 2020-05-02 IMAGING — DX DG CHEST 1V
1 series · 1 of 1 positions shown · non-contrast
Comparison: [DATE].

CLINICAL DATA: Shortness of breath status post surgery.

EXAM:
CHEST  1 VIEW

[chest ap]
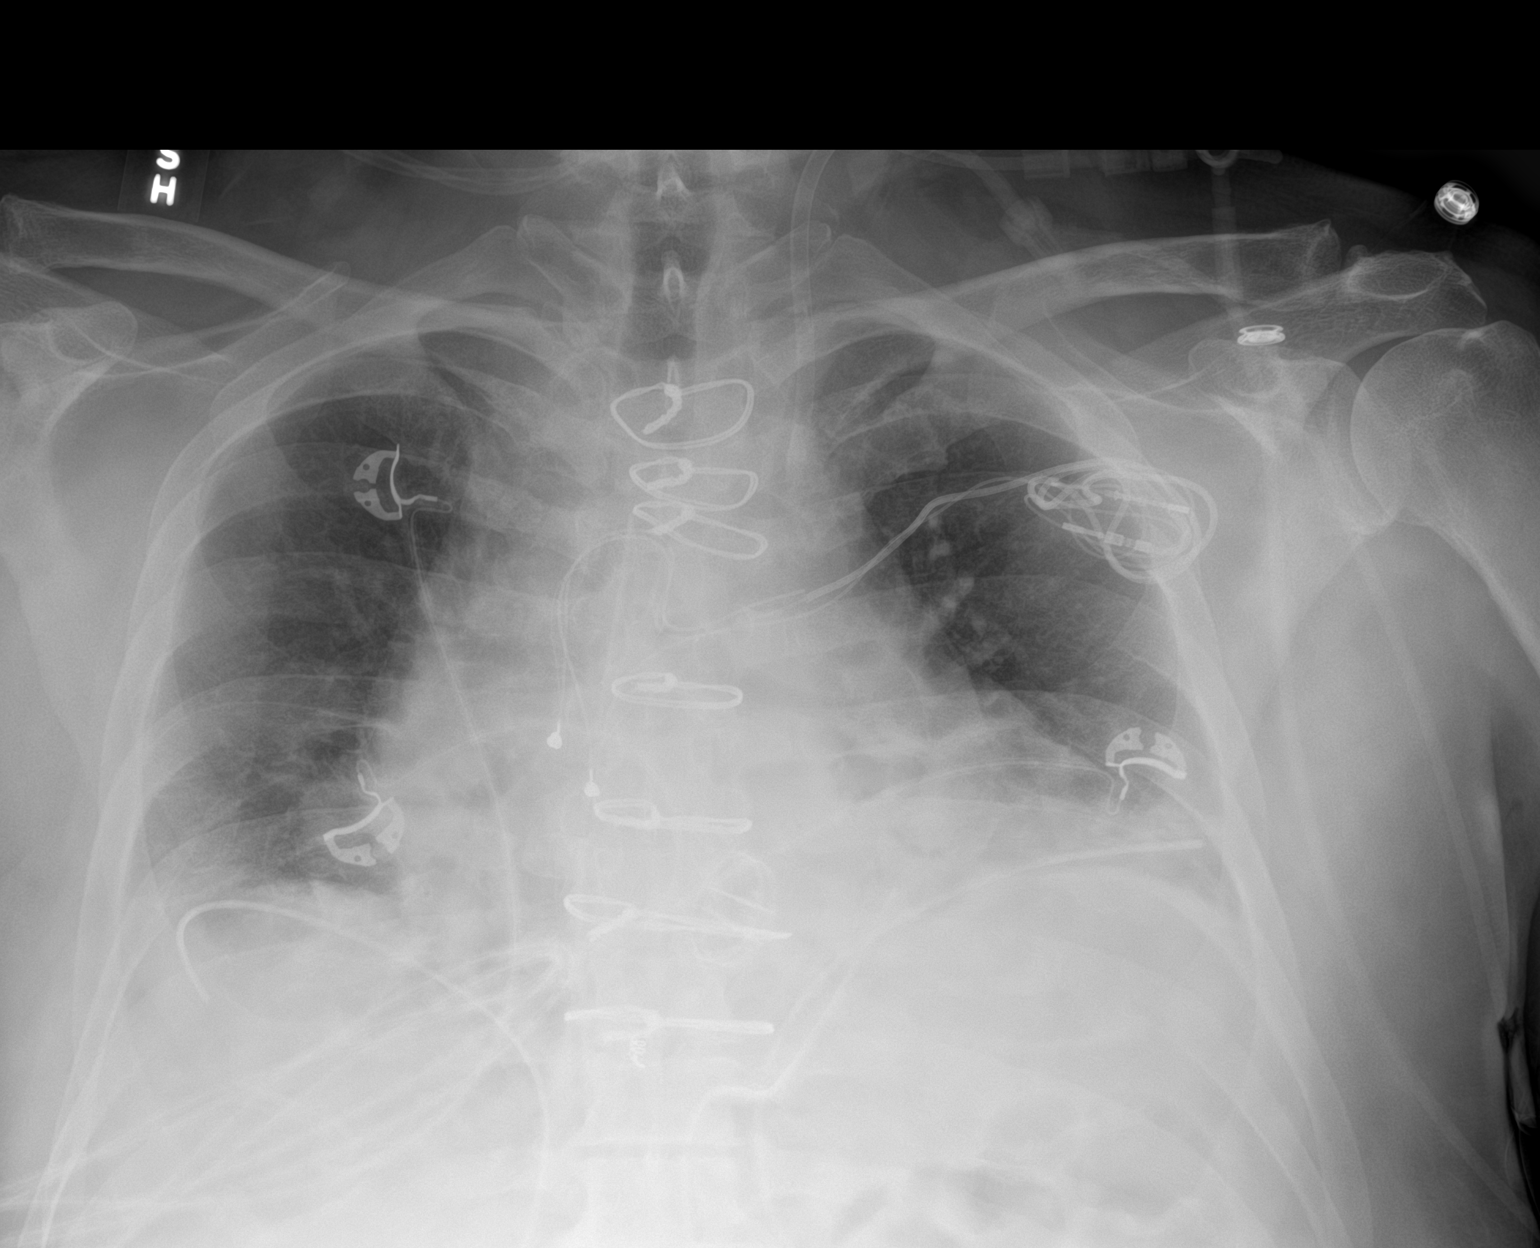

[1 of 1 positions shown; findings below may reference images not displayed]

FINDINGS: Stable cardiomegaly. Bilateral chest tubes are noted without
pneumothorax. Swan-Ganz catheter has been removed. Mild bibasilar
subsegmental atelectasis is noted. Bony thorax is unremarkable.
IMPRESSION: Bilateral chest tubes are noted without pneumothorax. Mild bibasilar
subsegmental atelectasis.

## 2020-05-02 MED ORDER — COUMADIN BOOK
Freq: Once | Status: AC
  Filled 2020-05-02 (×2): qty 1

## 2020-05-02 MED ORDER — INSULIN DETEMIR 100 UNIT/ML ~~LOC~~ SOLN
8.0000 [IU] | Freq: Two times a day (BID) | SUBCUTANEOUS | Status: DC
  Administered 2020-05-02 – 2020-05-06 (×9): 8 [IU] via SUBCUTANEOUS
  Filled 2020-05-02 (×13): qty 0.08

## 2020-05-02 MED ORDER — PLASMA-LYTE A IV SOLN
INTRAVENOUS | Status: DC
  Administered 2020-05-02: 100 mL/h via INTRAVENOUS

## 2020-05-02 MED ORDER — COLCHICINE 0.3 MG HALF TABLET
0.3000 mg | ORAL_TABLET | Freq: Two times a day (BID) | ORAL | Status: DC
  Administered 2020-05-02 (×2): 0.3 mg via ORAL
  Filled 2020-05-02 (×3): qty 1

## 2020-05-02 MED ORDER — LORATADINE 10 MG PO TABS
10.0000 mg | ORAL_TABLET | Freq: Once | ORAL | Status: AC
  Administered 2020-05-02: 10 mg via ORAL
  Filled 2020-05-02: qty 1

## 2020-05-02 MED ORDER — HYDROMORPHONE HCL 1 MG/ML IJ SOLN
0.5000 mg | INTRAMUSCULAR | Status: DC | PRN
  Administered 2020-05-04 – 2020-05-06 (×3): 1 mg via INTRAVENOUS
  Filled 2020-05-02 (×3): qty 1

## 2020-05-02 MED ORDER — WARFARIN - PHARMACIST DOSING INPATIENT: Freq: Every day | Status: DC

## 2020-05-02 MED ORDER — INSULIN ASPART 100 UNIT/ML ~~LOC~~ SOLN
2.0000 [IU] | SUBCUTANEOUS | Status: DC
  Administered 2020-05-02 (×2): 2 [IU] via SUBCUTANEOUS
  Administered 2020-05-03: 4 [IU] via SUBCUTANEOUS
  Administered 2020-05-03 (×3): 2 [IU] via SUBCUTANEOUS
  Administered 2020-05-03: 4 [IU] via SUBCUTANEOUS
  Administered 2020-05-03: 2 [IU] via SUBCUTANEOUS
  Administered 2020-05-03 – 2020-05-04 (×2): 4 [IU] via SUBCUTANEOUS
  Administered 2020-05-04 (×3): 2 [IU] via SUBCUTANEOUS
  Administered 2020-05-05 (×3): 4 [IU] via SUBCUTANEOUS

## 2020-05-02 MED ORDER — MUPIROCIN 2 % EX OINT
1.0000 "application " | TOPICAL_OINTMENT | Freq: Two times a day (BID) | CUTANEOUS | Status: AC
  Administered 2020-05-02 – 2020-05-06 (×10): 1 via NASAL
  Filled 2020-05-02 (×2): qty 22

## 2020-05-02 MED ORDER — WARFARIN SODIUM 5 MG PO TABS
5.0000 mg | ORAL_TABLET | Freq: Once | ORAL | Status: AC
  Administered 2020-05-02: 5 mg via ORAL
  Filled 2020-05-02: qty 1

## 2020-05-02 MED FILL — Thrombin (Recombinant) For Soln 5000 Unit: CUTANEOUS | Qty: 5000 | Status: AC

## 2020-05-02 NOTE — Progress Notes (Signed)
Orthopedic Tech Progress Note Patient Details:  Jonathan Shaffer 10/11/1965 [id number]  Ortho Devices Type of Ortho Device: Darco shoe, Postop shoe/boot Ortho Device/Splint Location: RLE.LLE Ortho Device/Splint Interventions: Ordered, Application   Post Interventions Patient Tolerated: Well Instructions Provided: Care of device   Janit Pagan 05/02/2020, 2:07 PM

## 2020-05-02 NOTE — Progress Notes (Signed)
Dr. Orvan Seen at bedside for pt eval and assessment.  Pt requested sleeping pill. Physician discussed risks/benefits with patient. (see orders)  Discussed ordered CT. Dr. Orvan Seen stated "that isn't a priority tonight, that can be evaluated tomorrow."

## 2020-05-02 NOTE — Progress Notes (Signed)
      [address]       [place] [address]             [phone]      POD # 2 MVR, pacer  Resting comfortably  BP 118/72   Pulse 80   Temp 97.8 F (36.6 C) (Axillary)   Resp 16   Ht 6\' 3"  (1.905 m)   Wt (!) 139.2 kg   SpO2 98%   BMI 38.36 kg/m  80 paced  Intake/Output Summary (Last 24 hours) at 05/02/2020 1740 Last data filed at 05/02/2020 1700 Gross per 24 hour  Intake 1931.54 ml  Output 2000 ml  Net -68.46 ml   CBG well controlled No PM labs  Jonathan Shaffer C. Roxan Hockey, MD [facility] [phone]

## 2020-05-02 NOTE — Op Note (Signed)
CARDIOTHORACIC SURGERY OPERATIVE NOTE  Date of Procedure: 04/26/2020 Preoperative Diagnosis: Mitral valve endocarditis  Postoperative Diagnosis: Same  Procedure:    Mitral valve replacement with 33 mm On-X mechanical prosthesis  Implantation of permanent epicardial pacemaking system with leads on the right atrium and right ventricle   Surgeon: B.  Murvin Natal, MD  Assistant: Leretha Pol, PA-C  Anesthesia: General endotracheal  Operative Findings:  Abscess at the P1 scallop and portion of the annulus  Minimal mitral regurgitation preoperatively  Well-seated mechanical prosthesis postoperatively    BRIEF CLINICAL NOTE AND INDICATIONS FOR SURGERY  54 year old gentleman presented with sepsis.  His work-up was demonstrated mitral endocarditis.  He has undergone a thorough evaluation prior to surgery.  He is considered a good candidate for surgery and is taken today for mitral valve replacement   DETAILS OF THE OPERATIVE PROCEDURE  Preparation:  The patient is brought to the operating room on the above mentioned date and central monitoring was established by the anesthesia team including placement of Swan-Ganz catheter and radial arterial line. The patient is placed in the supine position on the operating table.  Intravenous antibiotics are administered. General endotracheal anesthesia is induced uneventfully. A Foley catheter is placed.  Baseline transesophageal echocardiogram was performed.  Findings were notable for preserved LV function and minimal mitral regurgitation  The patient's chest, abdomen, both groins, and both lower extremities are prepared and draped in a sterile manner. A time out procedure is performed.   Surgical Approach and Conduit Harvest:  A median sternotomy incision was performed and the chest was entered safely without injury to underlying structures.  The pericardium was opened pericardial tacking sutures were placed.  Full dose heparin is given  centrally intravenously.  The aorta and superior vena cava are cannulated and the patient placed on cardiopulmonary bypass.  A separate cannula is inserted in the inferior vena cava.  A cardioplegia catheter was inserted through the right atrium to the coronary sinus.  Caval snares were placed around the IVC and SVC.  The patient is cooled to 32 C.   The aortic cross clamp is applied and cold blood cardioplegia is delivered initially in an antegrade fashion through the aortic root.   Supplemental cardioplegia is given retrograde through the coronary sinus catheter.  Iced saline slush is applied for topical hypothermia.  The initial cardioplegic arrest is rapid with early diastolic arrest.  Repeat doses of cardioplegia are administered intermittently throughout the entire cross clamp portion of the operation through the aortic root and through the coronary sinus catheter in order to maintain completely flat electrocardiogram.  Mitral valve replacement:  Caval snares were brought on the SVC and IVC.  The free wall of the right atrium was opened.  The interatrial septum was opened sharply.  Self-retaining retractors were placed within the left atrium to allow visualization of the mitral valve.  This demonstrated heavy abscess at the P1 level and encompassed the annulus and the valve itself.  The valve was thoroughly debrided.  The anterior leaflet is split down the middle and the cords were preserved.  The remainder of the P2 and P3 scallops including the chordae are preserved.  Circumferential sutures of 2-0 Ethibond with pledgets were placed on the atrial side.  These were the brought through the sewing cuff of a 33 mm mechanical prosthesis and the valve was sutured in position with core knot crimping device.  The valve seated well.  The interatrial septum was closed in layers.  The right atrial  free wall is then closed in layers.  De-airing procedures performed and the aortic cross-clamp was  removed.   Procedure Completion:  . Epicardial pacing wires are fixed to the right ventricular outflow tract and to the right atrial appendage. The patient is rewarmed to 37C temperature. The patient is weaned and disconnected from cardiopulmonary bypass.  The patient's rhythm at separation from bypass was heart block.  The patient was weaned from cardiopulmonary bypass with moderate inotropic support.    The aortic and venous cannula were removed uneventfully. Protamine was administered to reverse the anticoagulation. The mediastinum and pleural space were inspected for hemostasis and irrigated with saline solution.   Permanent pacemaking leads were placed in the right atrium and right ventricle and exited through a pocket created beneath the left collarbone.  These were tested prior to implantation to assure proper positioning.  The mediastinum and both pleural space were drained using fluted chest tubes placed through separate stab incisions inferiorly.  The soft tissues anterior to the aorta were reapproximated loosely. The sternum is closed with double strength sternal wire. The soft tissues anterior to the sternum were closed in multiple layers and the skin is closed with a running subcuticular skin closure.     Disposition:  The patient tolerated the procedure well and is transported to the surgical intensive care in stable condition. There are no intraoperative complications. All sponge instrument and needle counts are verified correct at completion of the operation.    Jayme Cloud, MD 05/02/2020 1:33 PM

## 2020-05-02 NOTE — Progress Notes (Signed)
2 Days Post-Op Procedure(s) (LRB): MITRAL VALVE (MV) REPLACEMENT USING ON-X MITRAL VALVE SIZE 31/33 MM,  PERMANENT EPICARDIAL PACEMAKING SYSTEM IMPLANT USING MEDTRONIC LEADS (N/A) TRANSESOPHAGEAL ECHOCARDIOGRAM (TEE) (N/A) Subjective: Slept a little  Objective: Vital signs in last 24 hours: Temp:  [97.2 F (36.2 C)-98.1 F (36.7 C)] 97.2 F (36.2 C) (11/29 1103) Pulse Rate:  [80-93] 80 (11/29 1100) Cardiac Rhythm: Atrial paced (11/29 0835) Resp:  [14-35] 14 (11/29 1100) BP: (111-168)/(80-113) 130/94 (11/29 1100) SpO2:  [92 %-98 %] 98 % (11/29 1100) Arterial Line BP: (156-231)/(84-227) 164/84 (11/28 1600) Weight:  [139.2 kg] 139.2 kg (11/29 0600)  Hemodynamic parameters for last 24 hours: PAP: (32-39)/(17-24) 35/20 CO:  [5.8 L/min] 5.8 L/min CI:  [2 L/min/m2-2.3 L/min/m2] 2 L/min/m2  Intake/Output from previous day: 11/28 0701 - 11/29 0700 In: 2399.9 [I.V.:1286.3; Blood:678.2; IV Piggyback:435.4] Out: 2488 [Urine:1098; Chest Tube:1390] Intake/Output this shift: Total I/O In: 394.4 [P.O.:60; I.V.:160.9; IV Piggyback:173.4] Out: 125 [Urine:125]  General appearance: alert and no distress Neurologic: intact Heart: regular rate and rhythm, S1, S2 normal, no murmur, click, rub or gallop Lungs: clear to auscultation bilaterally Abdomen: soft, non-tender; bowel sounds normal; no masses,  no organomegaly Extremities: edema 2+ Wound: dressed, dry  Lab Results: Recent Labs    05/01/20 1431 05/02/20 0407  WBC 19.9* 26.2*  HGB 8.9* 9.7*  HCT 27.0* 29.1*  PLT 242 279   BMET:  Recent Labs    05/01/20 1741 05/02/20 0407  NA 140 135  K 4.1 4.5  CL 106 101  CO2 21* 22  GLUCOSE 111* 128*  BUN 19 25*  CREATININE 2.02* 2.46*  CALCIUM 6.8* 7.3*    PT/INR:  Recent Labs    04/18/2020 1541  LABPROT 18.9*  INR 1.7*   ABG    Component Value Date/Time   PHART 7.419 04/24/2020 1823   HCO3 18.8 (L) 04/05/2020 1823   TCO2 20 (L) 04/06/2020 1823   ACIDBASEDEF 5.0 (H)  04/23/2020 1823   O2SAT 97.0 04/12/2020 1823   CBG (last 3)  Recent Labs    05/02/20 0616 05/02/20 0828 05/02/20 1102  GLUCAP 119* 119* 130*    Assessment/Plan: S/P Procedure(s) (LRB): MITRAL VALVE (MV) REPLACEMENT USING ON-X MITRAL VALVE SIZE 31/33 MM,  PERMANENT EPICARDIAL PACEMAKING SYSTEM IMPLANT USING MEDTRONIC LEADS (N/A) TRANSESOPHAGEAL ECHOCARDIOGRAM (TEE) (N/A) Mobilize ask CCM to see  Begin warfarin; no need for heparin drip    LOS: 10 days    Jonathan Shaffer 05/02/2020

## 2020-05-02 NOTE — Consult Note (Signed)
NAME:  Jonathan Shaffer, MRN:  [id number], DOB:  06-Jul-1965, LOS: 77 ADMISSION DATE:  04/21/2020, CONSULTATION DATE:  04/23/20 REFERRING MD:  Karleen Hampshire, CHIEF COMPLAINT:  Septic shock  Brief History   MSSA bacteremia +/- AC septic arthritis, septic emboli to spleen  History of present illness   Jonathan Shaffer is a 54 y/o gentleman admitted with sepsis, found to have MSSA bacteremia. He developed sudden onset nausea and vomiting and chills last week and later developed fevers and R shoulder pain. He was evaluated and felt to have a sprain. He was treated with an OP steroid injection and started oral steroids. He developed a painful rash on his hand. His progressive malaise, fevers, rash, and joint pain prompted return to the ED. At presentation he was febrile, tachycardic, tachypneic, with lactic acidosis. He was in DKA at presentation. He has a previous history of "high diabetes markers", which he reports he was not following up on. He was started on empiric vanc, zosyn & cefepime, and doxycycline. He has been evaluated by orthopaedics for possible septic arthritis of his R AC joint. ID has been consulted for MSSA+ blood cultures. No known history of IVDU or injuries prior to becoming ill. He has been encephalopathic this admission, prompting concern for septic emboli to the brain; MRI pending.  Past Medical History  Uncontrolled DM Testicular cancer; in remission HLD  Significant Hospital Events   Started vasopressors 11/20  Consults:  ID Ortho PCCM  Procedures:  PICC  Mechanical MV replacement  Significant Diagnostic Tests:  MRI R shoulder> septic arthritis AC joint, possible intramuscular abscess, OM distal clavicle Echocardiogram 11/19> LVEF 55 to 60%, normal valves. LUE US> no DVT MRI left wrist> cellulitis, no septic arthritis  Micro Data:  Blood culture 11/19> MSSA  2/3 Urine culture 11/19> staph aureus Blood culture 11/21> Operative cultures 11/22> abundant  GPC>  Antimicrobials:  Vanc 11/20 Zosyn 11/19 Cefepime 11/19- 11/20 Doxycycline 11/19 Nafcillin 11/20>  Interim history/subjective:  Itching all over his back and chest, ongoing for about 5 days. Still having chest pain from tubes and incision. R shoulder pain improving. Left foot pain persists.  Objective   Blood pressure (!) 128/99, pulse 93, temperature 98 F (36.7 C), temperature source Oral, resp. rate 16, height 6\' 3"  (1.905 m), weight (!) 139.2 kg, SpO2 97 %. PAP: (30-39)/(9-24) 35/20 CO:  [4.7 L/min-6 L/min] 5.8 L/min CI:  [1.8 L/min/m2-2.3 L/min/m2] 2 L/min/m2      Intake/Output Summary (Last 24 hours) at 05/02/2020 0841 Last data filed at 05/02/2020 0800 Gross per 24 hour  Intake 2402.91 ml  Output 2415 ml  Net -12.09 ml   Filed Weights   04/29/2020 0700 05/01/20 0500 05/02/20 0600  Weight: 132.1 kg (!) 137.6 kg (!) 139.2 kg    Examination: General: Ill-appearing middle-aged man lying in the recliner in no acute distress HENT: [place]/AT, eyes anicteric Lungs: Breathing comfortably on nasal cannula.  Clear to auscultation bilaterally anteriorly, reduced basilar breath sounds. Cardiovascular: Regular rate and rhythm, paced on telemetry.  Mechanical click throughout precordium. Abdomen: Soft, nontender, nondistended Extremities: Right shoulder bandage.  [name] on the toes persist.  Enlarged, hemorrhagic blistering lesion on the medial left foot has progressed since previous exam.  Pitting edema bilateral lower extremities. 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  -Appreciate ID's assistance; con't nafcillin.  Discussed concern for drug fever during rounds. -We will require 6 weeks of  antibiotics per ID recommendations.  Eventually will add rifampin. -Appreciate cardiothoracic surgery's management --Appreciate Ortho's assistance.  Concern for foot abscess that will require drainage for complete source control -Pharmacy consult for Coumadin -Dilaudid, oxycodone for pain control  Acute respiratory failure post-operatively, likely due to acute pulmonary edema, atelectasis, bilateral effusion -Chest tube management per primary -out of bed mobility, incentive spirometer -Titrate down FiO2 as able to be maintain SPO2 greater than 90%  Drug rash -Claritin -Transition morphine to Dilaudid -Discussed the possibility of transitioning antibiotics; will defer to infectious disease  AKI -worsening.  FeNa 0.8 consistent with prerenal etiology.  Likely diuresed through chest tube and intravascularly depleted. -Renally dose medications, avoid nephrotoxic meds -Agree with crystalloid resuscitation with Plasma-Lyte -Maintain MAP  >65 for adequate renal perfusion. -Strict I/Os  Sinus node dysfunction -May require pacemaker generator placement if this will be chronic -Continue telemetry monitoring and pacing   Hypokalemia, resolved -Continue to monitor  Hyponatremia-stable -Continue to monitor  DM with hyperglycemia- controlled; prev in DKA. A1c 11.3 -Insulin detemir 8 units twice daily plus siding scale insulin as needed -goal BG 140-180 while critically ill -needs OP DM education and much more strict control  Acute anemia; suspect this is mixed due to critical illness and operative blood loss- stable -con't to monitor -Transfuse hemoglobin less than 7 or hemodynamically significant bleeding  Hyperbilirubinemia dated transaminases likely 2/2 sepsis-likely due to critical illness; unclear trending up or down since recent surgery -Continue to monitor daily  Delirium- multifactorial due to sepsis, sleep disruption, ICU delirium -Out of bed mobility as able; PT  consult -Adequate pain control -Continue Seroquel for insomnia   Best practice:  Diet: NPO Pain/Anxiety/Delirium protocol (if indicated): fentanyl PRN VAP protocol (if indicated): n/a DVT prophylaxis: lovenox GI prophylaxis: n/a Glucose control: basal + SSI Mobility: progressive Code Status: full Family Communication: wife updated at bedside 11/29 Disposition: ICU  Labs   CBC: Recent Labs  Lab 04/21/2020 0258 05/02/2020 0918 04/17/2020 1541 04/16/2020 1704 04/09/2020 1823 04/05/2020 2255 05/01/20 0350 05/01/20 1431 05/02/20 0407  WBC 19.2*  --  35.4*  --   --  30.0* 24.8* 19.9* 26.2*  NEUTROABS 17.5*  --   --   --   --   --   --   --   --   HGB 10.3*   < > 7.8*  6.8*   < > 7.1* 8.0* 7.5* 8.9* 9.7*  HCT 33.0*   < > 23.0*  22.0*   < > 21.0* 24.6* 22.9* 27.0* 29.1*  MCV 94.6  --  96.1  --   --  93.9 93.5 90.9 91.5  PLT 640*   < > 389  --   --  314 306 242 279   < > = values in this interval not displayed.    Basic Metabolic Panel: Recent Labs  Lab 04/12/2020 0228 04/28/20 0014 04/11/2020 2322 04/12/2020 0918 04/09/2020 1328 04/18/2020 1331 04/09/2020 1823 04/21/2020 2255 05/01/20 0350 05/01/20 1431 05/01/20 1741 05/02/20 0407  NA 131*   < > 135   < > 138   < > 138 138 137  --  140 135  K 3.4*   < > 3.5   < > 5.0   < > 5.0 5.0 4.9  --  4.1 4.5  CL 102   < > 103   < > 104  --   --  104 102  --  106 101  CO2 22   < > 22  --   --   --   --  20* 21*  --  21* 22  GLUCOSE 137*   < > 88   < > 128*  --   --  134* 137*  --  111* 128*  BUN 17   < > 9   < > 10  --   --  14 15  --  19 25*  CREATININE 1.13   < > 1.20   < > 1.00  --   --  1.67* 1.79*  --  2.02* 2.46*  CALCIUM 7.0*   < > 7.5*  --   --   --   --  7.3* 7.1*  --  6.8* 7.3*  MG 2.2  --   --   --   --   --   --  2.9* 2.8* 2.6*  --  2.8*   < > = values in this interval not displayed.   GFR: Estimated Creatinine Clearance: 51.7 mL/min (A) (by C-G formula based on SCr of 2.46 mg/dL (H)). Recent Labs  Lab 04/23/2020 0228  04/04/2020 2201 04/28/20 0014 05/02/2020 0333 04/18/2020 2255 05/01/20 0350 05/01/20 1431 05/02/20 0407  WBC   < >  --  16.0*   < > 30.0* 24.8* 19.9* 26.2*  LATICACIDVEN  --  1.5 1.4  --   --   --   --   --    < > = values in this interval not displayed.    Liver Function Tests: Recent Labs  Lab 04/19/2020 0228 04/28/20 0014 04/18/2020 2322 05/01/20 1741  AST 20 24 22  3,257*  ALT 21 20 14  984*  ALKPHOS 73 83 85 123  BILITOT 2.1* 1.7* 1.4* 2.6*  PROT 5.3* 5.5* 5.7* 5.4*  ALBUMIN 1.6* 1.3* 1.3* 1.8*   No results for input(s): LIPASE, AMYLASE in the last 168 hours. No results for input(s): AMMONIA in the last 168 hours.  ABG    Component Value Date/Time   PHART 7.419 04/17/2020 1823   PCO2ART 29.2 (L) 04/19/2020 1823   PO2ART 94 04/21/2020 1823   HCO3 18.8 (L) 04/17/2020 1823   TCO2 20 (L) 04/29/2020 1823   ACIDBASEDEF 5.0 (H) 04/20/2020 1823   O2SAT 97.0 04/05/2020 1823     Coagulation Profile: Recent Labs  Lab 04/12/2020 0258 04/08/2020 1541  INR 1.2 1.7*    Cardiac Enzymes: No results for input(s): CKTOTAL, CKMB, CKMBINDEX, TROPONINI in the last 168 hours.  HbA1C: Hgb A1c MFr Bld  Date/Time Value Ref Range Status  05/01/2020 02:58 AM 10.8 (H) 4.8 - 5.6 % Final    Comment:    (NOTE) Pre diabetes:          5.7%-6.4%  Diabetes:              >6.4%  Glycemic control for   <7.0% adults with diabetes   05/03/2020 10:17 AM 11.3 (H) 4.8 - 5.6 % Final    Comment:    (NOTE) Pre diabetes:          5.7%-6.4%  Diabetes:              >6.4%  Glycemic control for   <7.0% adults with diabetes     CBG: Recent Labs  Lab 05/01/20 2228 05/02/20 0045 05/02/20 0411 05/02/20 0616 05/02/20 0828  GLUCAP 113* 116* 120* 119* 119*      Review of Systems:   Review  of Systems  Constitutional: Positive for malaise/fatigue.  HENT: Negative.   Respiratory: Negative.   Cardiovascular: Positive for chest pain and leg swelling.  Gastrointestinal: Negative.    Musculoskeletal: Positive for joint pain.  Skin: Positive for itching and rash.  Neurological: Positive for weakness.     Past Medical History  He,  has a past medical history of Cancer ([place]), Diabetes mellitus without complication ([place]), High cholesterol, Testicle cancer ([place]), Thyroid disease, and Urticaria.   Surgical History    Past Surgical History:  Procedure Laterality Date  . MINOR IRRIGATION AND DEBRIDEMENT OF WOUND Right 04/08/2020   Procedure: MINOR IRRIGATION AND DEBRIDEMENT OF WOUND;  Surgeon: Leandrew Koyanagi, MD;  Location: [facility];  Service: Orthopedics;  Laterality: Right;  . ORCHIECTOMY    . RIGHT/LEFT HEART CATH AND CORONARY ANGIOGRAPHY N/A 04/21/2020   Procedure: RIGHT/LEFT HEART CATH AND CORONARY ANGIOGRAPHY;  Surgeon: Jettie Booze, MD;  Location: [facility];  Service: Cardiovascular;  Laterality: N/A;  . TEE WITHOUT CARDIOVERSION N/A 04/24/2020   Procedure: TRANSESOPHAGEAL ECHOCARDIOGRAM (TEE);  Surgeon: Dorothy Spark, MD;  Location: [facility];  Service: Cardiovascular;  Laterality: N/A;     Social History   reports that he has never smoked. He has never used smokeless tobacco. He reports current alcohol use. He reports that he does not use drugs.   Family History   His family history includes Diabetes in his father; Heart disease in his father; Hyperlipidemia in his father; Hypertension in his father.   Allergies No Known Allergies   Home Medications  Prior to Admission medications   Medication Sig Start Date End Date Taking? Authorizing Provider  HYDROcodone-acetaminophen (NORCO/VICODIN) 5-325 MG tablet Take 1 tablet by mouth every 6 (six) hours as needed for moderate pain.   Yes [provider]  lidocaine (LIDODERM) 5 % Place 1 patch onto the skin daily. Remove & Discard patch within 12 hours or as directed by MD 04/17/20  Yes Tacy Learn, PA-C  meloxicam (MOBIC) 7.5 MG tablet Take 7.5 mg by mouth daily.   Yes [provider]  predniSONE (DELTASONE) 10 MG tablet Take as directed for 12 days.  Daily dose 6,6,5,5,4,4,3,3,2,2,1,1. Patient taking differently: Take 10-60 mg by mouth See admin instructions. Take as directed for 12 days.  Daily dose 6,6,5,5,4,4,3,3,2,2,1,1. 04/21/20  Yes Hilts, Legrand Como, MD  traMADol (ULTRAM) 50 MG tablet Take 1-2 tablets (50-100 mg total) by mouth every 6 (six) hours as needed for moderate pain. 04/19/20  Yes Hilts, Legrand Como, MD        Julian Hy, DO 05/02/20 8:17 PM [facility]

## 2020-05-02 NOTE — Progress Notes (Signed)
ANTICOAGULATION CONSULT NOTE - Initial Consult  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  No Known Allergies  Patient Measurements: Height: 6\' 3"  (190.5 cm) Weight: (!) 139.2 kg (306 lb 14.1 oz) IBW/kg (Calculated) : 84.5  Vital Signs: Temp: 98 F (36.7 C) (11/29 0826) Temp Source: Oral (11/29 0826) BP: 128/99 (11/29 0800) Pulse Rate: 93 (11/29 0800)  Labs: Recent Labs    04/25/2020 0258 04/24/2020 0918 04/29/2020 1541 04/24/2020 1704 05/01/20 0350 05/01/20 0350 05/01/20 1431 05/01/20 1741 05/02/20 0407  HGB 10.3*   < > 7.8*  6.8*   < > 7.5*   < > 8.9*  --  9.7*  HCT 33.0*   < > 23.0*  22.0*   < > 22.9*  --  27.0*  --  29.1*  PLT 640*   < > 389   < > 306  --  242  --  279  APTT 29  --  37*  --   --   --   --   --   --   LABPROT 14.4  --  18.9*  --   --   --   --   --   --   INR 1.2  --  1.7*  --   --   --   --   --   --   CREATININE  --    < >  --    < > 1.79*  --   --  2.02* 2.46*   < > = values in this interval not displayed.    Estimated Creatinine Clearance: 51.7 mL/min (A) (by C-G formula based on SCr of 2.46 mg/dL (H)).   Medical History: Past Medical History:  Diagnosis Date  . Cancer ([place])   . Diabetes mellitus without complication ([place])   . High cholesterol   . Testicle cancer ([place])   . Thyroid disease   . 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 Hgb improving post-op, currently up to 9.7, plt wnl. No overt bleeding noted. Pharmacy consulted to start warfarin therapy this afternoon.   Goal of Therapy:  INR goal 2.5-3.5 Monitor platelets by anticoagulation protocol: Yes   Plan:  Warfarin 5mg  tonight Daily INR Warfarin education prior to discharge  Erin Hearing PharmD., BCPS Clinical Pharmacist 05/02/2020 9:24 AM

## 2020-05-02 NOTE — Progress Notes (Signed)
[name]  Date of Admission:  04/09/2020      Total days of antibiotics 10             ASSESSMENT: Jonathan Shaffer is a 54 y.o. 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   Improving on therapy with Nafcillin. Bump in WBC following valve surgery as to be expected. Possible drug rash seen today.  Check differential today to look for peripheral eosinophilia. If negative would continue another 24h and follow. ?rash from sweating vs drug related rash. Long term I worry he may not be able to tolerate Nafcillin with his kidneys. Discussed with ID pharm team and likely will plan to switch to Vancomycin for better standard of care for MSSA endocarditis. Would avoid gentamicyn synergy given AKI; will consider addition of rifampin once LFTs trend down (acute from surgery/cardiac etiology).   Dr. Sharol Given evaluated L foot - felt ischemia is primary driver; d/w him and will try to decompress the edema with close evaluation. Exam not c/w abscess. CT scan cancelled for now and will follow for changes. Unable to do MRI with epicardial wires currently.    PLAN: 1. Continue nafcillin another day and re-eval rash 2. Check Differential  3. Hold on addition of gent / rifampin  4. 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 sodium chloride   Intravenous Once  . sodium chloride   Intravenous Once  . acetaminophen  1,000 mg Oral Q6H   Or  . acetaminophen (TYLENOL) oral liquid 160 mg/5 mL  1,000 mg Per  Tube Q6H  . aspirin EC  81 mg Oral Daily  . atorvastatin  40 mg Oral Daily  . bisacodyl  10 mg Oral Daily   Or  . bisacodyl  10 mg Rectal Daily  . Chlorhexidine Gluconate Cloth  6 each Topical Daily  . colchicine  0.3 mg Oral BID  . coumadin book   Does not apply Once  . docusate sodium  200 mg Oral Daily  . insulin aspart  2-6 Units Subcutaneous Q4H  . insulin detemir  8 Units Subcutaneous Q12H  . mouth rinse  15 mL Mouth Rinse BID  . mupirocin ointment  1 application Nasal BID  . pantoprazole  40 mg Oral Daily  . sodium chloride flush  10-40 mL Intracatheter Q12H  . sodium chloride flush  3 mL Intravenous Q12H  . Warfarin - Pharmacist Dosing Inpatient   Does not apply q1600    SUBJECTIVE: Feels OK. Tired.   Wife is present with a family friend on the phone. Concerned over foot and rash. His wife reports that he has been itching a few days now. Has also been sweating a lot. Feels the rash is mostly on his back and near axillae.    Review of Systems: Review of Systems  Constitutional: Negative for chills, fever, malaise/fatigue and weight loss.  HENT: Negative for sore throat.   Respiratory: Negative for cough, sputum production and  shortness of breath.   Gastrointestinal: Negative for abdominal pain, diarrhea and vomiting.  Musculoskeletal: Positive for joint pain (Foot pain L>R). Negative for myalgias and neck pain.  Skin: Positive for rash. Itching: wife reports itching.  Neurological: Negative for headaches.  Psychiatric/Behavioral: Negative for depression and substance abuse. The patient is not nervous/anxious.     No Known Allergies  OBJECTIVE: Vitals:   05/02/20 1300 05/02/20 1400 05/02/20 1500 05/02/20 1535  BP: 117/88 110/79 102/76   Pulse: 80 80 80   Resp: 14 13 14    Temp:    97.8 F (36.6 C)  TempSrc:    Axillary  SpO2: 97% 99% 98%   Weight:      Height:       Body mass index is 38.36 kg/m.  Physical Exam Constitutional:      Appearance: He is  well-developed.     Comments: Sitting upright in recliner. Comfortable but tired appearing.   HENT:     Mouth/Throat:     Mouth: Mucous membranes are moist.     Dentition: Normal dentition. No dental abscesses.     Pharynx: No oropharyngeal exudate.  Eyes:     General: No scleral icterus. Cardiovascular:     Rate and Rhythm: Normal rate and regular rhythm.     Heart sounds: Normal heart sounds. No murmur heard.   Pulmonary:     Effort: Pulmonary effort is normal.     Breath sounds: Normal breath sounds.  Chest:     Comments: Midsternal incision dressed and clean.  Abdominal:     General: Bowel sounds are normal. There is no distension.     Palpations: Abdomen is soft.     Tenderness: There is no abdominal tenderness.  Musculoskeletal:     Comments: L foot with purple edematous skin changes to forefoot from great toe through #3 toe   Lymphadenopathy:     Cervical: No cervical adenopathy.  Skin:    General: Skin is warm and dry.     Findings: No rash.     Comments: Blanchable pink/red rash noted on patients upper back and axilla extending down lateral abdomen. Not raised appearing.   Neurological:     Mental Status: He is alert and oriented to person, place, and time.  Psychiatric:        Judgment: Judgment normal.     Lab Results Lab Results  Component Value Date   WBC 26.2 (H) 05/02/2020   HGB 9.7 (L) 05/02/2020   HCT 29.1 (L) 05/02/2020   MCV 91.5 05/02/2020   PLT 279 05/02/2020    Lab Results  Component Value Date   CREATININE 2.46 (H) 05/02/2020   BUN 25 (H) 05/02/2020   NA 135 05/02/2020   K 4.5 05/02/2020   CL 101 05/02/2020   CO2 22 05/02/2020    Lab Results  Component Value Date   ALT 984 (H) 05/01/2020   AST 3,257 (H) 05/01/2020   ALKPHOS 123 05/01/2020   BILITOT 2.6 (H) 05/01/2020     Microbiology: Recent Results (from the past 240 hour(s))  Culture, blood (Routine X 2) w Reflex to ID Panel     Status: None   Collection Time: 04/24/20  7:52  AM   Specimen: BLOOD LEFT HAND  Result Value Ref Range Status   Specimen Description   Final    BLOOD LEFT HAND Performed at [facility], [place] [address], [place], [place] [address]    Special Requests   Final  BOTTLES DRAWN AEROBIC AND ANAEROBIC BLOOD LEFT HAND Performed at [facility] [address] [place], [place] [address]    Culture   Final    NO GROWTH 5 DAYS Performed at [facility], [place] [address] [place], [place] [address]    Report Status 04/04/2020 FINAL  Final  Culture, blood (Routine X 2) w Reflex to ID Panel     Status: None   Collection Time: 04/24/20  7:52 AM   Specimen: BLOOD LEFT HAND  Result Value Ref Range Status   Specimen Description   Final    BLOOD LEFT HAND Performed at [facility], [place] [address], [place], [place] [address]    Special Requests   Final    BOTTLES DRAWN AEROBIC AND ANAEROBIC BLOOD LEFT HAND Performed at [facility] [address], [place], [place] [address]    Culture   Final    NO GROWTH 5 DAYS Performed at [facility], [place] [address] [place], [place] [address]    Report Status 04/08/2020 FINAL  Final  Aerobic/Anaerobic Culture (surgical/deep wound)     Status: None   Collection Time: 04/26/2020  2:48 PM   Specimen: PATH Other; Tissue  Result Value Ref Range Status   Specimen Description   Final    ABSCESS SHOULDER RIGHT Performed at [facility] [address] [place], [place] [address]    Special Requests   Final    NONE Performed at [facility], [place] [address], [place], [place] [address]    Gram Stain   Final    FEW WBC PRESENT, PREDOMINANTLY PMN ABUNDANT GRAM POSITIVE COCCI    Culture   Final    MODERATE STAPHYLOCOCCUS AUREUS NO ANAEROBES ISOLATED Performed at [facility], [place] [address], [place], [place] [address]    Report Status 05/01/2020 FINAL  Final   Organism ID, Bacteria STAPHYLOCOCCUS  AUREUS  Final      Susceptibility   Staphylococcus aureus - MIC*    CIPROFLOXACIN <=0.5 SENSITIVE Sensitive     ERYTHROMYCIN <=0.25 SENSITIVE Sensitive     GENTAMICIN <=0.5 SENSITIVE Sensitive     OXACILLIN 0.5 SENSITIVE Sensitive     TETRACYCLINE <=1 SENSITIVE Sensitive     VANCOMYCIN 1 SENSITIVE Sensitive     TRIMETH/SULFA <=10 SENSITIVE Sensitive     CLINDAMYCIN <=0.25 SENSITIVE Sensitive     RIFAMPIN <=0.5 SENSITIVE Sensitive     Inducible Clindamycin NEGATIVE Sensitive     * MODERATE STAPHYLOCOCCUS AUREUS  Aerobic/Anaerobic Culture (surgical/deep wound)     Status: None   Collection Time: 04/18/2020  2:58 PM   Specimen: PATH Other; Tissue  Result Value Ref Range Status   Specimen Description   Final    TISSUE SHOULDER RIGHT Performed at [facility] [address], [place], [place] [address]    Special Requests   Final    NONE Performed at [facility], [place] [address], [place], [place] [address]    Gram Stain   Final    ABUNDANT WBC PRESENT, PREDOMINANTLY PMN ABUNDANT GRAM POSITIVE COCCI    Culture   Final    ABUNDANT STAPHYLOCOCCUS AUREUS NO ANAEROBES ISOLATED Performed at [facility], [place] [address], [place], [place] [address]    Report Status 05/01/2020 FINAL  Final   Organism ID, Bacteria STAPHYLOCOCCUS AUREUS  Final      Susceptibility   Staphylococcus aureus - MIC*  CIPROFLOXACIN <=0.5 SENSITIVE Sensitive     ERYTHROMYCIN <=0.25 SENSITIVE Sensitive     GENTAMICIN <=0.5 SENSITIVE Sensitive     OXACILLIN 0.5 SENSITIVE Sensitive     TETRACYCLINE <=1 SENSITIVE Sensitive     VANCOMYCIN <=0.5 SENSITIVE Sensitive     TRIMETH/SULFA <=10 SENSITIVE Sensitive     CLINDAMYCIN <=0.25 SENSITIVE Sensitive     RIFAMPIN <=0.5 SENSITIVE Sensitive     Inducible Clindamycin NEGATIVE Sensitive     * ABUNDANT STAPHYLOCOCCUS AUREUS  Culture, blood (Routine X 2) w Reflex to ID Panel     Status: None   Collection Time: 04/26/20  9:58  AM   Specimen: BLOOD RIGHT HAND  Result Value Ref Range Status   Specimen Description   Final    BLOOD RIGHT HAND Performed at [facility] [address], [place], [place] [address]    Special Requests   Final    BOTTLES DRAWN AEROBIC ONLY Blood Culture results may not be optimal due to an inadequate volume of blood received in culture bottles Performed at [facility] [address], [place], [place] [address]    Culture   Final    NO GROWTH 5 DAYS Performed at [facility], [place] [address], [place], [place] [address]    Report Status 05/01/2020 FINAL  Final  Culture, blood (Routine X 2) w Reflex to ID Panel     Status: None   Collection Time: 04/26/20  9:58 AM   Specimen: BLOOD RIGHT HAND  Result Value Ref Range Status   Specimen Description   Final    BLOOD RIGHT HAND Performed at [facility] [address], [place], [place] [address]    Special Requests   Final    BOTTLES DRAWN AEROBIC ONLY Blood Culture results may not be optimal due to an inadequate volume of blood received in culture bottles Performed at [facility] [address], [place], [place] [address]    Culture   Final    NO GROWTH 5 DAYS Performed at [facility], [place] [address] [place], [place] [address]    Report Status 05/01/2020 FINAL  Final  Surgical pcr screen     Status: Abnormal   Collection Time: 04/04/2020  2:39 AM   Specimen: Nasal Mucosa; Nasal Swab  Result Value Ref Range Status   MRSA, PCR NEGATIVE NEGATIVE Final   Staphylococcus aureus POSITIVE (A) NEGATIVE Final    Comment: (NOTE) The Xpert SA Assay (FDA approved for NASAL specimens in patients 58 years of age and older), is one component of a comprehensive surveillance program. It is not intended to diagnose infection nor to guide or monitor treatment. Performed at [facility], [place] [address], [place], [place] [address]   Aerobic/Anaerobic  Culture (surgical/deep wound)     Status: None (Preliminary result)   Collection Time: 04/30/20 11:01 AM   Specimen: Other Source  Result Value Ref Range Status   Specimen Description TISSUE  Final   Special Requests   Final    MITRAL VALVE VEGETATION SPEC A ANTIBIOTICS ZINACEF VANCOMYCIN   Gram Stain   Final    ABUNDANT WBC PRESENT, PREDOMINANTLY PMN NO ORGANISMS SEEN Performed at [facility], [place] [address] [place], [place] [address]    Culture   Final    RARE STAPHYLOCOCCUS AUREUS CRITICAL RESULT CALLED TO, READ BACK BY AND VERIFIED WITH: RN Mellissa Kohut [id number] AT 17 BY CM NO ANAEROBES ISOLATED;  CULTURE IN PROGRESS FOR 5 DAYS    Report Status PENDING  Incomplete   Organism ID, Bacteria STAPHYLOCOCCUS AUREUS  Final      Susceptibility   Staphylococcus aureus - MIC*    CIPROFLOXACIN <=0.5 SENSITIVE Sensitive     ERYTHROMYCIN <=0.25 SENSITIVE Sensitive     GENTAMICIN <=0.5 SENSITIVE Sensitive     OXACILLIN 0.5 SENSITIVE Sensitive     TETRACYCLINE <=1 SENSITIVE Sensitive     VANCOMYCIN 1 SENSITIVE Sensitive     TRIMETH/SULFA <=10 SENSITIVE Sensitive     CLINDAMYCIN <=0.25 SENSITIVE Sensitive     RIFAMPIN <=0.5 SENSITIVE Sensitive     Inducible Clindamycin NEGATIVE Sensitive     * RARE STAPHYLOCOCCUS AUREUS     Janene Madeira, MSN, NP-C [facility] [email or web address] Pager: [phone] Office: [phone] [place]: [phone]

## 2020-05-02 NOTE — Progress Notes (Signed)
Overnight Progress Note:  Weaned off Vaso at midnight.  AAI@92 , Mil Amp 10, Sens .7 VS stable.  Insulin drip between .7-1.2 u/hr most of night. CBG 113-129 overnight.  Morning labs K= 4.5, mg 2.8, BUN 23, creat 2.46. WBC 26.2, Hg 9.7.   Totals: PTs = 450 cc MTs = 40 cc UOP = 640 cc  Overall stable night. Pt did sleep most of the night. Pt up in chair @ 0600. Pt tolerated transfer from bed to chair fair  Pt NPO at midnight per Ortho order, no further details on planned procedure.

## 2020-05-02 NOTE — Consult Note (Signed)
ORTHOPAEDIC CONSULTATION  REQUESTING PHYSICIAN: Wonda Olds, MD  Chief Complaint: Ischemic ulcerations bilateral forefoot left worse than right.  HPI: Jonathan Shaffer is a 53 y.o. male who presents with status post cardiac surgery with a possible thrombotic embolic event involving both feet left worse than right patient is seen for evaluation of the ischemic ulcers both feet.  Past Medical History:  Diagnosis Date  . Cancer ([place])   . Diabetes mellitus without complication ([place])   . High cholesterol   . 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 Thyroid disease   . Urticaria    Recurrent idiopathic urticaria in 2018   Past Surgical History:  Procedure Laterality Date  . MINOR IRRIGATION AND DEBRIDEMENT OF WOUND Right 04/17/2020   Procedure: MINOR IRRIGATION AND DEBRIDEMENT OF WOUND;  Surgeon: Leandrew Koyanagi, MD;  Location: [facility];  Service: Orthopedics;  Laterality: Right;  . ORCHIECTOMY    . RIGHT/LEFT HEART CATH AND CORONARY ANGIOGRAPHY N/A 04/09/2020   Procedure: RIGHT/LEFT HEART CATH AND CORONARY ANGIOGRAPHY;  Surgeon: Jettie Booze, MD;  Location: [facility];  Service: Cardiovascular;  Laterality: N/A;  . TEE WITHOUT CARDIOVERSION N/A 04/27/2020   Procedure: TRANSESOPHAGEAL ECHOCARDIOGRAM (TEE);  Surgeon: Dorothy Spark, MD;  Location: [facility] ENDOSCOPY;  Service: Cardiovascular;  Laterality: N/A;   Social History   Socioeconomic History  . Marital status: Married    Spouse name: Not on file  . Number of children: Not on file  . Years of education: Not on file  . Highest education level: Not on file  Occupational History  . Not on file  Tobacco Use  . Smoking status: Never Smoker  . Smokeless tobacco: Never Used  Vaping Use  . Vaping Use: Never used  Substance and Sexual Activity  . Alcohol use: Yes  . Drug use: Never  . Sexual activity: Not on file  Other Topics Concern  . Not on file  Social History Narrative  . Not on file   Social Determinants of  Health   Financial Resource Strain:   . Difficulty of Paying Living Expenses: Not on file  Food Insecurity:   . Worried About Charity fundraiser in the Last Year: Not on file  . Ran Out of Food in the Last Year: Not on file  Transportation Needs:   . Lack of Transportation (Medical): Not on file  . Lack of Transportation (Non-Medical): Not on file  Physical Activity:   . Days of Exercise per Week: Not on file  . Minutes of Exercise per Session: Not on file  Stress:   . Feeling of Stress : Not on file  Social Connections:   . Frequency of Communication with Friends and Family: Not on file  . Frequency of Social Gatherings with Friends and Family: Not on file  . Attends Religious Services: Not on file  . Active Member of Clubs or Organizations: Not on file  . Attends Archivist Meetings: Not on file  . Marital Status: Not on file   Family History  Problem Relation Age of Onset  . Diabetes Father   . Hypertension Father   . Heart disease Father   . Hyperlipidemia Father    - negative except otherwise stated in the family history section No Known Allergies Prior to Admission medications   Medication Sig Start Date End Date Taking? Authorizing Provider  HYDROcodone-acetaminophen (NORCO/VICODIN) 5-325 MG tablet Take 1 tablet by mouth every 6 (six) hours as needed for moderate  pain.   Yes [provider]  lidocaine (LIDODERM) 5 % Place 1 patch onto the skin daily. Remove & Discard patch within 12 hours or as directed by MD 04/17/20  Yes Tacy Learn, PA-C  meloxicam (MOBIC) 7.5 MG tablet Take 7.5 mg by mouth daily.   Yes [provider]  predniSONE (DELTASONE) 10 MG tablet Take as directed for 12 days.  Daily dose 6,6,5,5,4,4,3,3,2,2,1,1. Patient taking differently: Take 10-60 mg by mouth See admin instructions. Take as directed for 12 days.  Daily dose 6,6,5,5,4,4,3,3,2,2,1,1. 04/21/20  Yes Hilts, Legrand Como, MD  traMADol (ULTRAM) 50 MG tablet Take 1-2  tablets (50-100 mg total) by mouth every 6 (six) hours as needed for moderate pain. 04/19/20  Yes Hilts, Legrand Como, MD   DG Chest 1 View  Result Date: 05/02/2020 CLINICAL DATA:  Shortness of breath status post surgery. EXAM: CHEST  1 VIEW COMPARISON:  May 01, 2020. FINDINGS: Stable cardiomegaly. Bilateral chest tubes are noted without pneumothorax. Swan-Ganz catheter has been removed. Mild bibasilar subsegmental atelectasis is noted. Bony thorax is unremarkable. IMPRESSION: Bilateral chest tubes are noted without pneumothorax. Mild bibasilar subsegmental atelectasis. Electronically Signed   By: Marijo Conception M.D.   On: 05/02/2020 09:10   DG Chest Port 1 View  Result Date: 05/01/2020 CLINICAL DATA:  Status post mitral valve replacement EXAM: PORTABLE CHEST 1 VIEW COMPARISON:  April 30, 2020 FINDINGS: Endotracheal tube and nasogastric tube have been removed. Swan-Ganz catheter tip is in the main pulmonary outflow tract. Left chest tube and mediastinal drains unchanged in position. Pacemaker wires remain with pacemaker device no longer present. Status post mitral valve replacement. No pneumothorax. There is bibasilar atelectasis. Lungs otherwise clear. Heart is enlarged, stable, with pulmonary vascularity normal. No adenopathy. No bone lesions. IMPRESSION: Tube and catheter positions as described without pneumothorax. Bibasilar atelectasis, essentially stable. Stable cardiac prominence. Electronically Signed   By: Lowella Grip III M.D.   On: 05/01/2020 07:55   DG Chest Port 1 View  Result Date: 04/17/2020 CLINICAL DATA:  Status post mitral valve repair. EXAM: PORTABLE CHEST 1 VIEW COMPARISON:  April 22, 2020 FINDINGS: Post intubation, satisfactory position of the endotracheal tube. Swan-Ganz catheter terminates at the expected location of the pulmonary outflow tract. Artificial mitral valve noted. Mediastinal and chest drains in place. Enteric catheter in place. Cardiomediastinal  silhouette is normal. Mediastinal contours appear intact. There is no evidence of focal airspace consolidation, pleural effusion or pneumothorax. Low lung volumes. Osseous structures are without acute abnormality. Soft tissues are grossly normal. IMPRESSION: 1. Post intubation, satisfactory position of the endotracheal tube. 2. Low lung volumes. Electronically Signed   By: Fidela Salisbury M.D.   On: 04/18/2020 16:06   [place] LT LOWER EXTREM LTD SOFT TISSUE NON VASCULAR  Result Date: 05/01/2020 CLINICAL DATA:  Concern for abscess EXAM: ULTRASOUND LEFT FIRST TOE TECHNIQUE: Longitudinal and transverse images of the left first toe obtained. COMPARISON:  None. FINDINGS: Along the medial aspect of the left first toe at site of palpable fullness, there is a complex fluid collection measuring 2.3 x 2.4 x 1.5 cm. No other lesion evident in this area. IMPRESSION: Apparent abscess along the medial aspect of the left first toe measuring 2.3 x 2.4 x 1.5 cm. No other lesion evident by ultrasound in this region. Electronically Signed   By: Lowella Grip III M.D.   On: 05/01/2020 13:23   - pertinent xrays, CT, MRI studies were reviewed and independently interpreted  Positive ROS: All other systems have been  reviewed and were otherwise negative with the exception of those mentioned in the HPI and as above.  Physical Exam: General: Alert, no acute distress Psychiatric: Patient is competent for consent with normal mood and affect Lymphatic: No axillary or cervical lymphadenopathy Cardiovascular: No pedal edema Respiratory: No cyanosis, no use of accessory musculature GI: No organomegaly, abdomen is soft and non-tender    Images:  @ENCIMAGES @  Labs:  Lab Results  Component Value Date   HGBA1C 10.8 (H) 04/14/2020   HGBA1C 11.3 (H) 04/08/2020   ESRSEDRATE 60 (H) 04/16/2020   CRP 34.3 (H) 04/08/2020   REPTSTATUS PENDING 04/04/2020   GRAMSTAIN  04/06/2020    ABUNDANT WBC PRESENT, PREDOMINANTLY PMN NO  ORGANISMS SEEN    CULT  04/11/2020    RARE STAPHYLOCOCCUS AUREUS CRITICAL RESULT CALLED TO, READ BACK BY AND VERIFIED WITH: RN Mellissa Kohut [id number] AT 1212 BY CM Performed at [facility], [address], [place], [place] [address]    [place] 05/02/2020    Lab Results  Component Value Date   ALBUMIN 1.8 (L) 05/01/2020   ALBUMIN 1.3 (L) 04/24/2020   ALBUMIN 1.3 (L) 04/28/2020    Neurologic: Patient does not have protective sensation bilateral lower extremities.   MUSCULOSKELETAL:   Skin: Examination patient has an ischemic ulcer on the plantar aspect of the medial column left foot beneath the great toe and first metatarsal head.  The bilateral forefoot is cool there is mild ischemic changes of the lateral forefoot on the right.  A Doppler was used and patient has a strong biphasic pulse on the right dorsalis pedis and posterior tibial and strong posterior tibial biphasic pulse on the left with a weaker dopplerable pulse for the dorsalis pedis on the left but this may be secondary to swelling.  Patient massive pitting edema in both lower extremities.  There is no dermatitis ulceration or drainage from either leg.  I cannot palpate any fluctuance there is no cellulitis no clinical signs of an abscess.  Patient's most recent hemoglobin A1c is 10.8 with an albumin of 1.8.  Assessment: Assessment: Swelling bilateral lower extremities with possible ischemic thrombotic embolic event to both feet left worse than right.  Plan: Plan: I will order knee-high compression stockings from [facility].  We will also order a Darco shoe for the left foot weightbearing as tolerated both lower extremities with physical therapy.  Thank you for the consult and the opportunity to see Mr. Jonathan Shaffer, [place] [phone] 11:49 AM

## 2020-05-03 ENCOUNTER — Inpatient Hospital Stay (HOSPITAL_COMMUNITY): Payer: BC Managed Care – PPO

## 2020-05-03 DIAGNOSIS — I823 Embolism and thrombosis of renal vein: Secondary | ICD-10-CM | POA: Diagnosis not present

## 2020-05-03 DIAGNOSIS — J90 Pleural effusion, not elsewhere classified: Secondary | ICD-10-CM

## 2020-05-03 DIAGNOSIS — L989 Disorder of the skin and subcutaneous tissue, unspecified: Secondary | ICD-10-CM

## 2020-05-03 DIAGNOSIS — I059 Rheumatic mitral valve disease, unspecified: Secondary | ICD-10-CM

## 2020-05-03 DIAGNOSIS — R001 Bradycardia, unspecified: Secondary | ICD-10-CM

## 2020-05-03 DIAGNOSIS — L270 Generalized skin eruption due to drugs and medicaments taken internally: Secondary | ICD-10-CM

## 2020-05-03 DIAGNOSIS — J810 Acute pulmonary edema: Secondary | ICD-10-CM

## 2020-05-03 DIAGNOSIS — E441 Mild protein-calorie malnutrition: Secondary | ICD-10-CM

## 2020-05-03 LAB — BASIC METABOLIC PANEL
Anion gap: 15 (ref 5–15)
BUN: 32 mg/dL — ABNORMAL HIGH (ref 6–20)
CO2: 22 mmol/L (ref 22–32)
Calcium: 6.9 mg/dL — ABNORMAL LOW (ref 8.9–10.3)
Chloride: 98 mmol/L (ref 98–111)
Creatinine, Ser: 2.92 mg/dL — ABNORMAL HIGH (ref 0.61–1.24)
GFR, Estimated: 25 mL/min — ABNORMAL LOW (ref >60)
Glucose, Bld: 144 mg/dL — ABNORMAL HIGH (ref 70–99)
Potassium: 4.4 mmol/L (ref 3.5–5.1)
Sodium: 135 mmol/L (ref 135–145)

## 2020-05-03 LAB — CBC
HCT: 31.1 % — ABNORMAL LOW (ref 39.0–52.0)
Hemoglobin: 10.1 g/dL — ABNORMAL LOW (ref 13.0–17.0)
MCH: 30.1 pg (ref 26.0–34.0)
MCHC: 32.5 g/dL (ref 30.0–36.0)
MCV: 92.6 fL (ref 80.0–100.0)
Platelets: 311 10*3/uL (ref 150–400)
RBC: 3.36 MIL/uL — ABNORMAL LOW (ref 4.22–5.81)
RDW: 16.3 % — ABNORMAL HIGH (ref 11.5–15.5)
WBC: 30.3 10*3/uL — ABNORMAL HIGH (ref 4.0–10.5)
nRBC: 0.6 % — ABNORMAL HIGH (ref 0.0–0.2)

## 2020-05-03 LAB — HEPATIC FUNCTION PANEL
ALT: 686 U/L — ABNORMAL HIGH (ref 0–44)
AST: 1021 U/L — ABNORMAL HIGH (ref 15–41)
Albumin: 1.4 g/dL — ABNORMAL LOW (ref 3.5–5.0)
Alkaline Phosphatase: 372 U/L — ABNORMAL HIGH (ref 38–126)
Bilirubin, Direct: 1.4 mg/dL — ABNORMAL HIGH (ref 0.0–0.2)
Indirect Bilirubin: 2.3 mg/dL — ABNORMAL HIGH (ref 0.3–0.9)
Total Bilirubin: 3.7 mg/dL — ABNORMAL HIGH (ref 0.3–1.2)
Total Protein: 5.6 g/dL — ABNORMAL LOW (ref 6.5–8.1)

## 2020-05-03 LAB — GLUCOSE, CAPILLARY
Glucose-Capillary: 124 mg/dL — ABNORMAL HIGH (ref 70–99)
Glucose-Capillary: 127 mg/dL — ABNORMAL HIGH (ref 70–99)
Glucose-Capillary: 148 mg/dL — ABNORMAL HIGH (ref 70–99)
Glucose-Capillary: 151 mg/dL — ABNORMAL HIGH (ref 70–99)
Glucose-Capillary: 170 mg/dL — ABNORMAL HIGH (ref 70–99)
Glucose-Capillary: 181 mg/dL — ABNORMAL HIGH (ref 70–99)

## 2020-05-03 LAB — PROTIME-INR
INR: 2 — ABNORMAL HIGH (ref 0.8–1.2)
Prothrombin Time: 22.4 seconds — ABNORMAL HIGH (ref 11.4–15.2)

## 2020-05-03 LAB — PATHOLOGIST SMEAR REVIEW

## 2020-05-03 LAB — PREALBUMIN: Prealbumin: <5 mg/dL — ABNORMAL LOW (ref 18–38)

## 2020-05-03 LAB — AMMONIA: Ammonia: 43 umol/L — ABNORMAL HIGH (ref 9–35)

## 2020-05-03 IMAGING — DX DG CHEST 1V
1 series · 1 of 1 positions shown · non-contrast
Comparison: [DATE].

CLINICAL DATA: Chest tube. Open-heart surgery. Shortness of breath.

EXAM:
CHEST  1 VIEW

[chest ap]
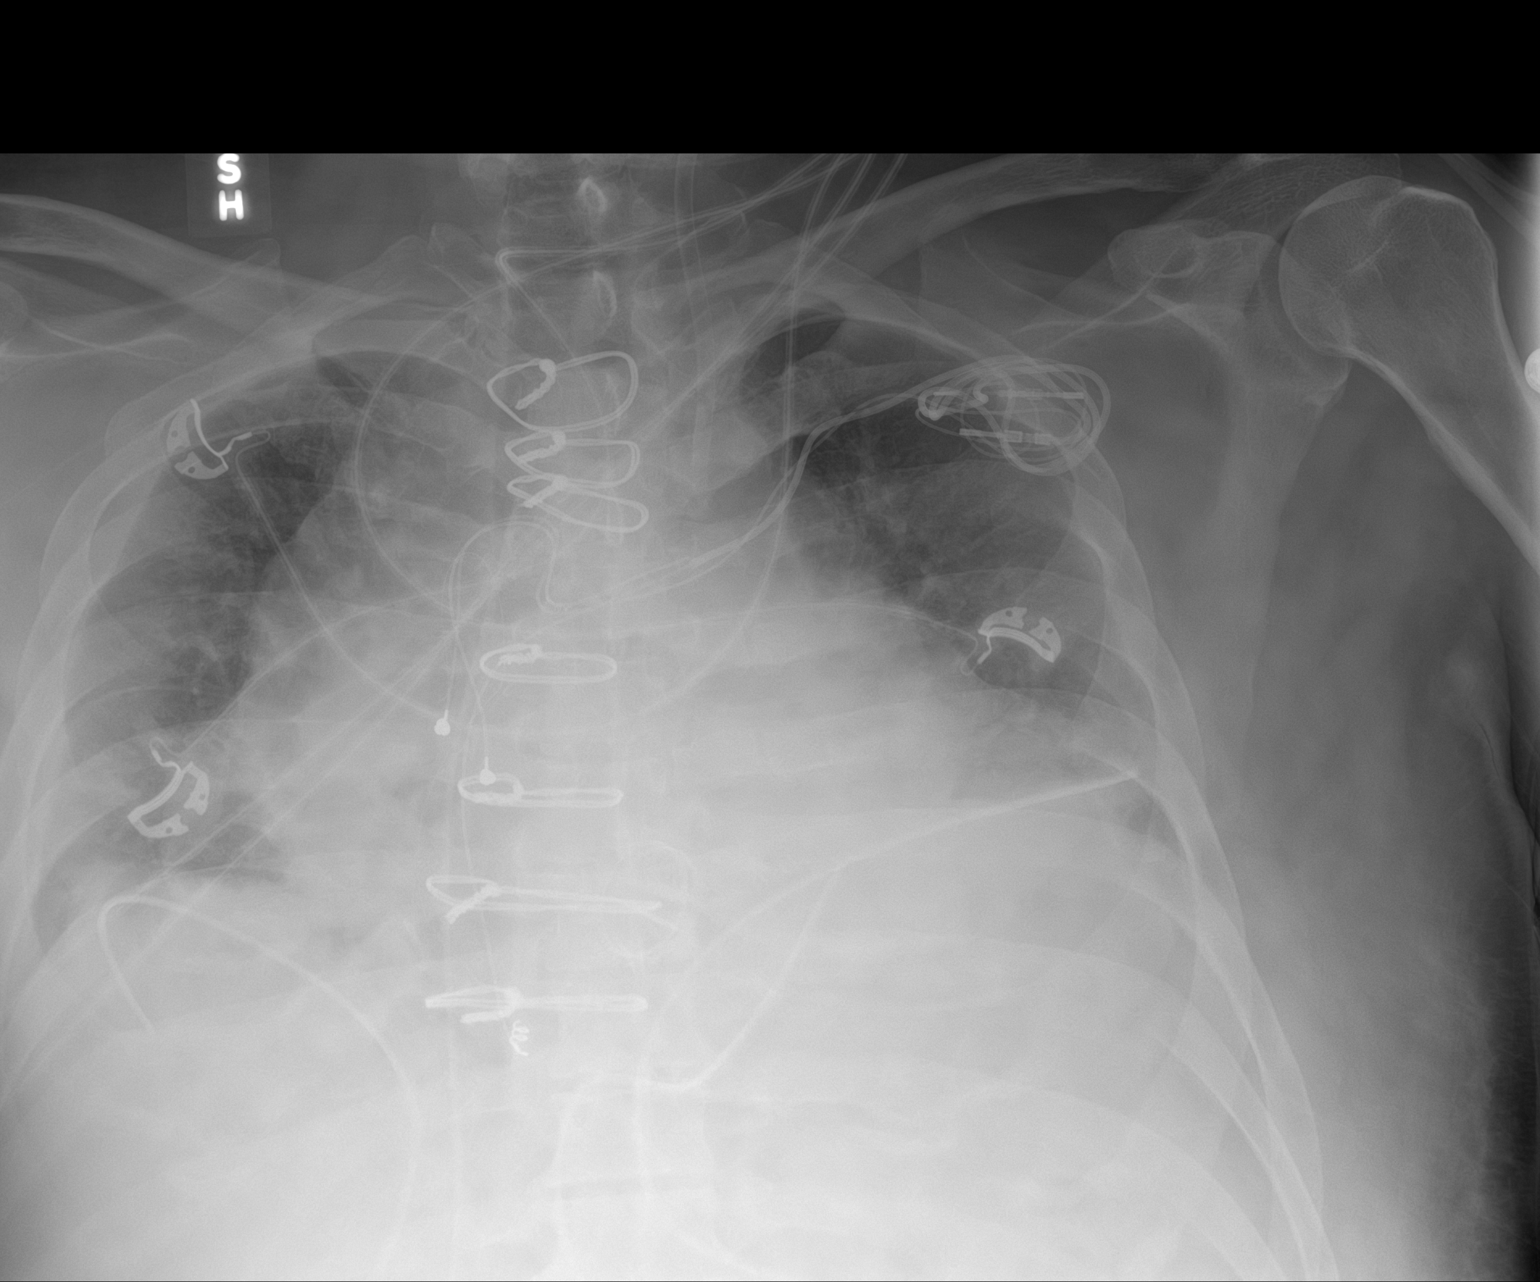

[1 of 1 positions shown; findings below may reference images not displayed]

FINDINGS: Left IJ sheath in stable position. Bilateral chest tubes in stable
position. Cardiomegaly with bilateral mild pulmonary interstitial
prominence suggesting pulmonary interstitial edema, new from prior
exam. Persistent bibasilar atelectasis. No pleural effusion or
pneumothorax.
IMPRESSION: 1. Left IJ sheath in stable position. Bilateral chest tubes in
stable position. No pneumothorax.
2. Cardiomegaly with new bilateral mild interstitial prominence
suggesting pulmonary interstitial edema. Persistent bibasilar
atelectasis.

## 2020-05-03 IMAGING — US US EXTREM LOW*L* LIMITED
1 series · 14 of 20 positions shown · non-contrast
Comparison: Ultrasound [DATE].

CLINICAL DATA: Follow-up left great toe lesion

EXAM:
ULTRASOUND  LEFT FIRST TOE
TECHNIQUE: Ultrasound examination of the lower extremity soft tissues was
performed in the area of clinical concern.

[Series 1: us left lower extrem ltd soft tissue non vascular · 20 acquisitions, 14 frames shown]
[im 1/20]
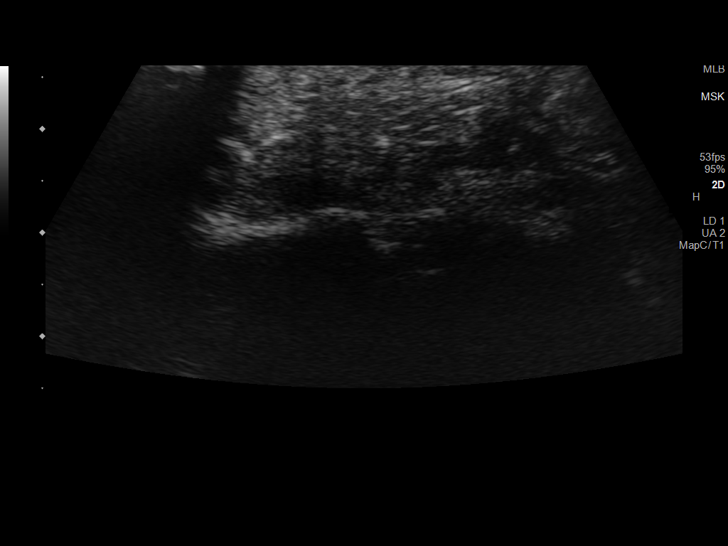
[im 3/20]
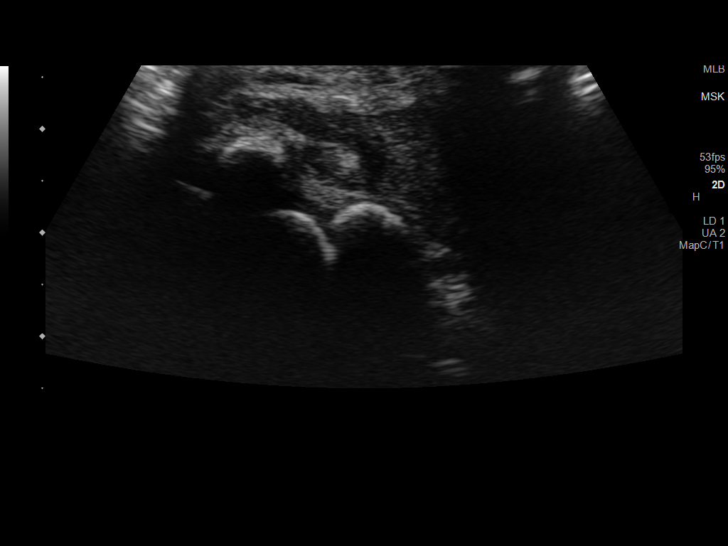
[im 4/20]
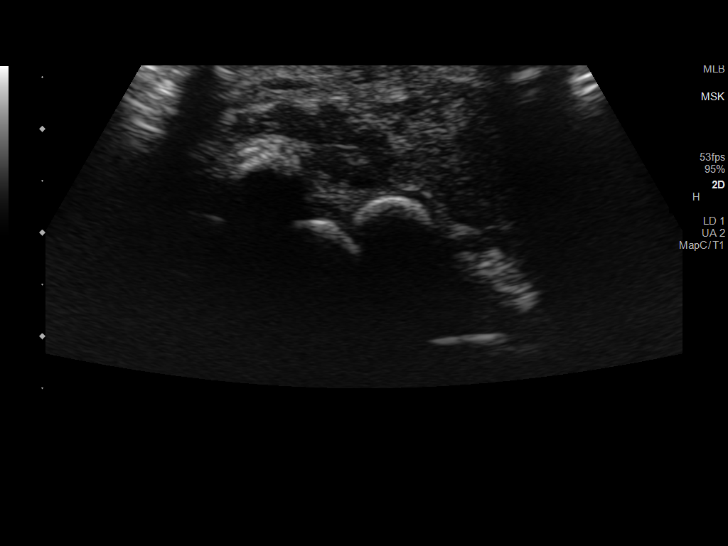
[im 6/20]
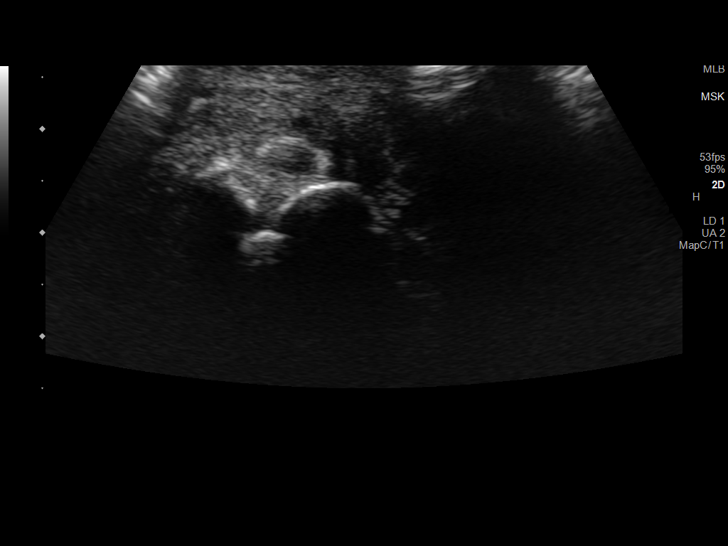
[im 7/20]
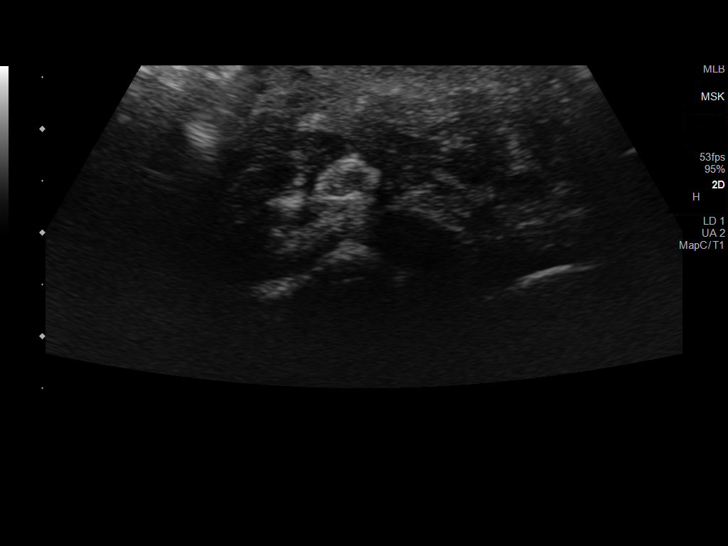
[im 8/20]
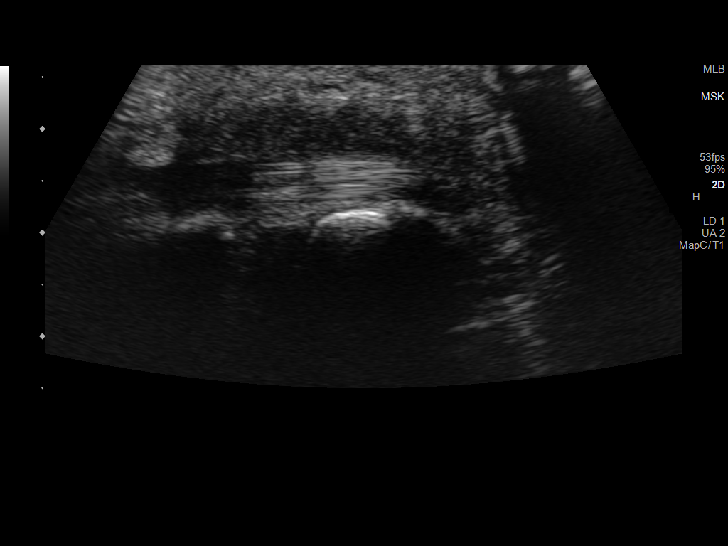
[im 10/20]
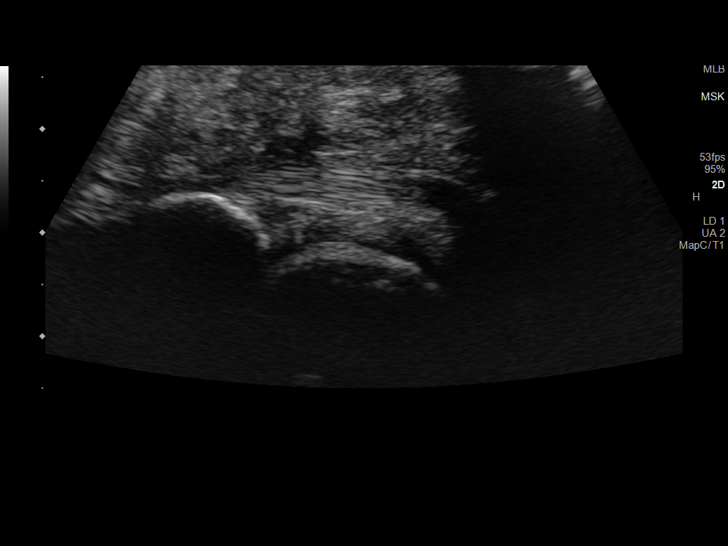
[im 11/20]
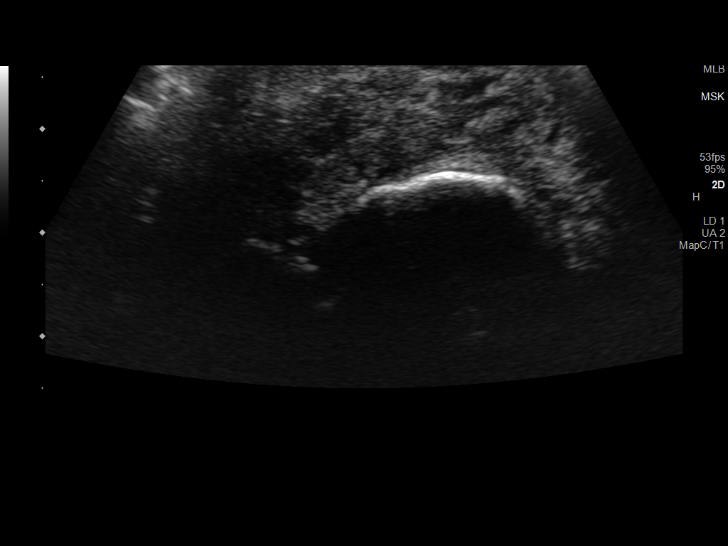
[im 13/20]
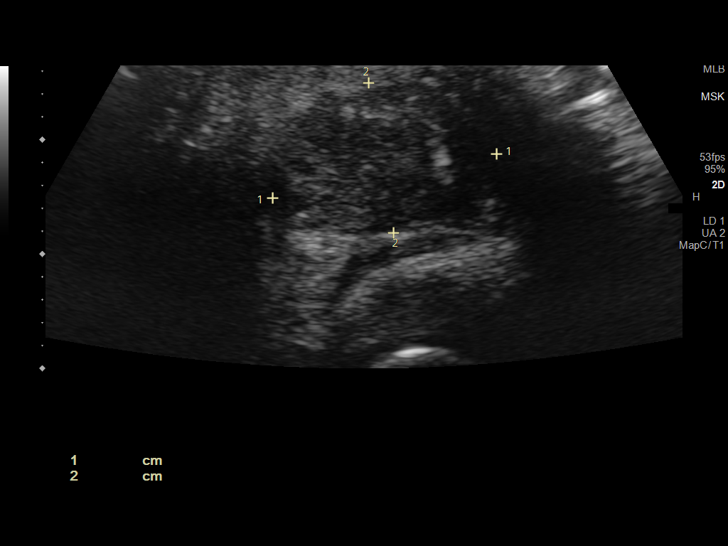
[im 14/20]
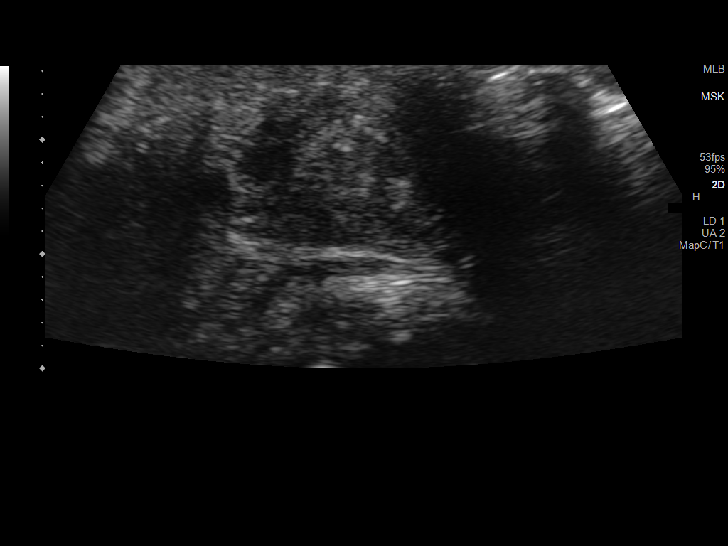
[im 16/20]
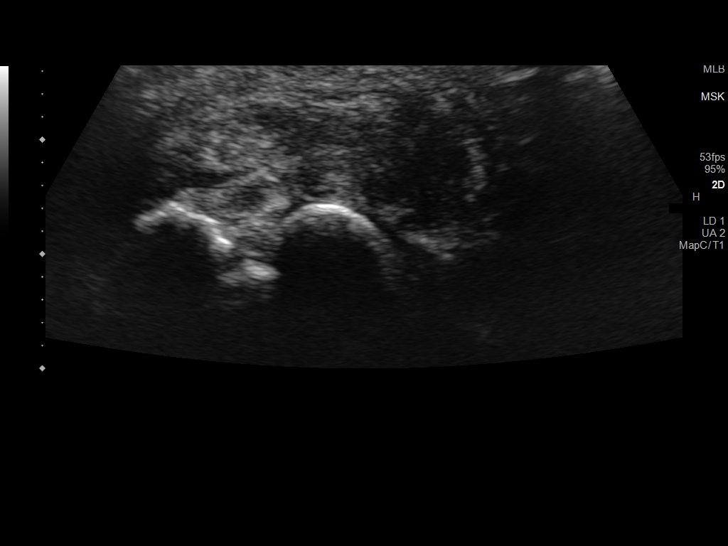
[im 17/20]
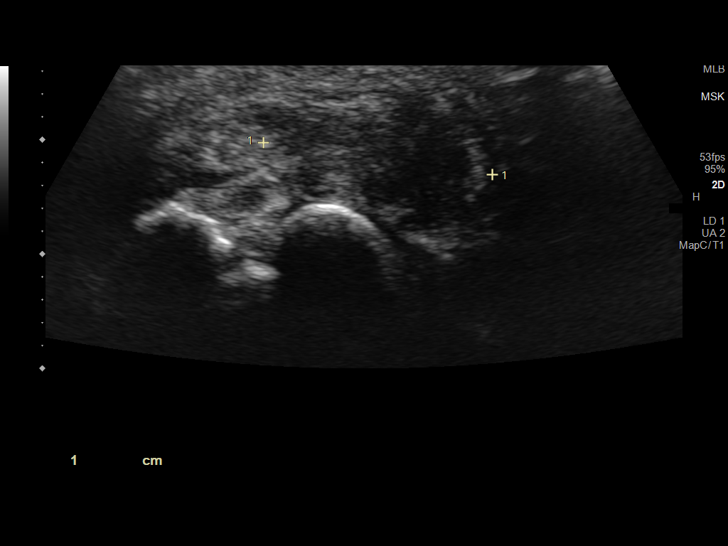
[im 18/20]
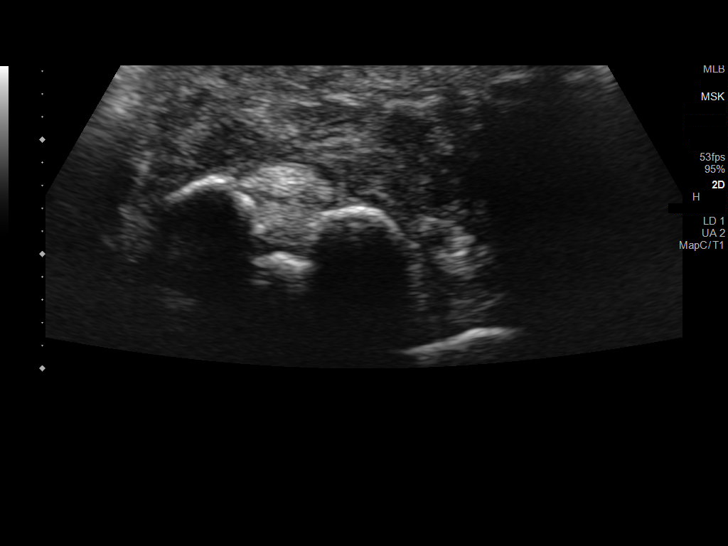
[im 20/20]
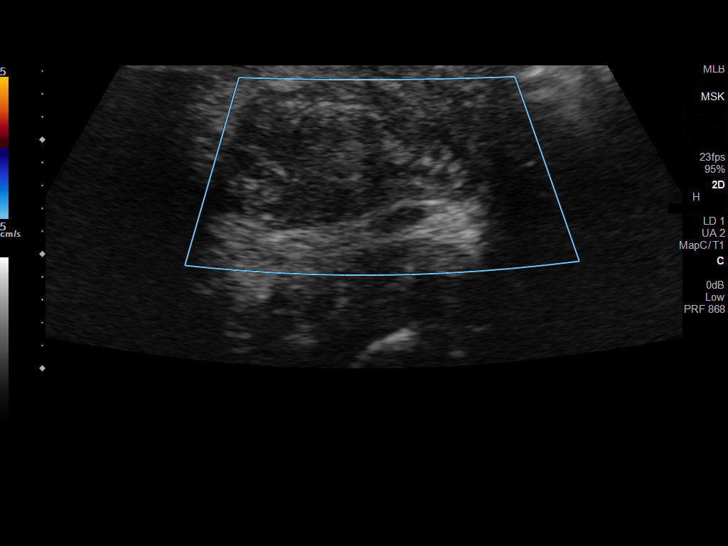

[14 of 20 positions shown; findings below may reference images not displayed]

FINDINGS: Decreased size of the complex fluid collection along the medial
aspect of left great toe which now measures 2.2 x 2.2 x 1.3 cm
previously 2.4 x 2.3 x 1.5 cm.
IMPRESSION: Interval decrease in size of the phlegmonous collection/developing
abscess along the medial aspect of the left great toe.

## 2020-05-03 MED ORDER — ALPRAZOLAM 0.25 MG PO TABS
0.2500 mg | ORAL_TABLET | Freq: Once | ORAL | Status: AC
  Administered 2020-05-03: 0.25 mg via ORAL
  Filled 2020-05-03: qty 1

## 2020-05-03 MED ORDER — ALBUMIN HUMAN 5 % IV SOLN
12.5000 g | Freq: Once | INTRAVENOUS | Status: AC
  Administered 2020-05-03: 12.5 g via INTRAVENOUS
  Filled 2020-05-03: qty 250

## 2020-05-03 MED ORDER — ADULT MULTIVITAMIN W/MINERALS CH
1.0000 | ORAL_TABLET | Freq: Every day | ORAL | Status: DC
  Administered 2020-05-03 – 2020-05-05 (×3): 1 via ORAL
  Filled 2020-05-03 (×3): qty 1

## 2020-05-03 MED ORDER — FUROSEMIDE 10 MG/ML IJ SOLN
60.0000 mg | Freq: Once | INTRAMUSCULAR | Status: AC
  Administered 2020-05-03: 60 mg via INTRAVENOUS
  Filled 2020-05-03: qty 6

## 2020-05-03 MED ORDER — WARFARIN SODIUM 2.5 MG PO TABS
2.5000 mg | ORAL_TABLET | Freq: Once | ORAL | Status: AC
  Administered 2020-05-03: 2.5 mg via ORAL
  Filled 2020-05-03: qty 1

## 2020-05-03 MED ORDER — WARFARIN SODIUM 5 MG PO TABS: 5.0000 mg | ORAL_TABLET | Freq: Once | ORAL | Status: DC

## 2020-05-03 MED ORDER — ENSURE ENLIVE PO LIQD
237.0000 mL | Freq: Two times a day (BID) | ORAL | Status: DC
  Administered 2020-05-03 – 2020-05-05 (×2): 237 mL via ORAL

## 2020-05-03 MED ORDER — LORATADINE 10 MG PO TABS
10.0000 mg | ORAL_TABLET | Freq: Every day | ORAL | Status: DC
  Administered 2020-05-03 – 2020-05-05 (×3): 10 mg via ORAL
  Filled 2020-05-03 (×3): qty 1

## 2020-05-03 MED ORDER — ALBUMIN HUMAN 25 % IV SOLN
12.5000 g | Freq: Once | INTRAVENOUS | Status: AC
  Administered 2020-05-03: 12.5 g via INTRAVENOUS
  Filled 2020-05-03: qty 50

## 2020-05-03 MED ORDER — LACTULOSE 10 GM/15ML PO SOLN
30.0000 g | Freq: Three times a day (TID) | ORAL | Status: DC
  Administered 2020-05-03 – 2020-05-05 (×7): 30 g via ORAL
  Filled 2020-05-03 (×8): qty 45

## 2020-05-03 MED ORDER — COLCHICINE 0.6 MG PO TABS
0.6000 mg | ORAL_TABLET | Freq: Two times a day (BID) | ORAL | Status: DC
  Administered 2020-05-03 – 2020-05-05 (×6): 0.6 mg via ORAL
  Filled 2020-05-03 (×6): qty 1

## 2020-05-03 MED FILL — Heparin Sodium (Porcine) Inj 1000 Unit/ML: INTRAMUSCULAR | Qty: 2500 | Status: AC

## 2020-05-03 MED FILL — Sodium Bicarbonate IV Soln 8.4%: INTRAVENOUS | Qty: 50 | Status: AC

## 2020-05-03 MED FILL — Thrombin For Soln 5000 Unit: CUTANEOUS | Qty: 5000 | Status: AC

## 2020-05-03 MED FILL — Cefuroxime Sodium For Inj 750 MG: INTRAMUSCULAR | Qty: 750 | Status: AC

## 2020-05-03 MED FILL — Calcium Chloride Inj 10%: INTRAVENOUS | Qty: 10 | Status: AC

## 2020-05-03 MED FILL — Magnesium Sulfate Inj 50%: INTRAMUSCULAR | Qty: 10 | Status: AC

## 2020-05-03 MED FILL — Heparin Sodium (Porcine) Inj 1000 Unit/ML: INTRAMUSCULAR | Qty: 10 | Status: AC

## 2020-05-03 MED FILL — Heparin Sodium (Porcine) Inj 1000 Unit/ML: INTRAMUSCULAR | Qty: 30 | Status: AC

## 2020-05-03 MED FILL — Sodium Chloride IV Soln 0.9%: INTRAVENOUS | Qty: 4000 | Status: AC

## 2020-05-03 MED FILL — Potassium Chloride Inj 2 mEq/ML: INTRAVENOUS | Qty: 40 | Status: AC

## 2020-05-03 MED FILL — Mannitol IV Soln 20%: INTRAVENOUS | Qty: 500 | Status: AC

## 2020-05-03 NOTE — Progress Notes (Signed)
3 Days Post-Op Procedure(s) (LRB): MITRAL VALVE (MV) REPLACEMENT USING ON-X MITRAL VALVE SIZE 31/33 MM,  PERMANENT EPICARDIAL PACEMAKING SYSTEM IMPLANT USING MEDTRONIC LEADS (N/A) TRANSESOPHAGEAL ECHOCARDIOGRAM (TEE) (N/A) Subjective: No complaints  Objective: Vital signs in last 24 hours: Temp:  [97.2 F (36.2 C)-98 F (36.7 C)] 97.9 F (36.6 C) (11/30 0408) Pulse Rate:  [80] 80 (11/30 0600) Cardiac Rhythm: Atrial paced (11/30 0400) Resp:  [13-28] 28 (11/30 0600) BP: (102-143)/(72-94) 111/73 (11/30 0600) SpO2:  [96 %-99 %] 99 % (11/30 0600) Weight:  [140.1 kg] 140.1 kg (11/30 0500)  Hemodynamic parameters for last 24 hours:    Intake/Output from previous day: 11/29 0701 - 11/30 0700 In: 2893.8 [P.O.:180; I.V.:2144.8; IV Piggyback:569] Out: 2380 [Urine:1305; Chest Tube:1075] Intake/Output this shift: Total I/O In: 240 [P.O.:240] Out: -   General appearance: alert and cooperative Neurologic: intact Heart: regular rate and rhythm, S1, S2 normal, no murmur, click, rub or gallop Lungs: clear to auscultation bilaterally Abdomen: soft, non-tender; bowel sounds normal; no masses,  no organomegaly Extremities: extremities normal, atraumatic, no cyanosis or edema Wound: dressed  Lab Results: Recent Labs    05/02/20 0407 05/03/20 0424  WBC 26.2* 30.3*  HGB 9.7* 10.1*  HCT 29.1* 31.1*  PLT 279 311   BMET:  Recent Labs    05/02/20 0407 05/03/20 0424  NA 135 135  K 4.5 4.4  CL 101 98  CO2 22 22  GLUCOSE 128* 144*  BUN 25* 32*  CREATININE 2.46* 2.92*  CALCIUM 7.3* 6.9*    PT/INR:  Recent Labs    05/03/20 0424  LABPROT 22.4*  INR 2.0*   ABG    Component Value Date/Time   PHART 7.419 04/20/2020 1823   HCO3 18.8 (L) 04/08/2020 1823   TCO2 20 (L) 04/20/2020 1823   ACIDBASEDEF 5.0 (H) 04/11/2020 1823   O2SAT 97.0 04/25/2020 1823   CBG (last 3)  Recent Labs    05/02/20 2336 05/03/20 0406 05/03/20 0652  GLUCAP 133* 124* 127*    Assessment/Plan: S/P  Procedure(s) (LRB): MITRAL VALVE (MV) REPLACEMENT USING ON-X MITRAL VALVE SIZE 31/33 MM,  PERMANENT EPICARDIAL PACEMAKING SYSTEM IMPLANT USING MEDTRONIC LEADS (N/A) TRANSESOPHAGEAL ECHOCARDIOGRAM (TEE) (N/A) Mobilize Diuresis check ammonia  Remove mediastinal tubes Consult EP   LOS: 11 days    Jonathan Shaffer 05/03/2020

## 2020-05-03 NOTE — Consult Note (Signed)
NAME:  Jonathan Shaffer, MRN:  [id number], DOB:  November 02, 1965, LOS: 35 ADMISSION DATE:  04/29/2020, CONSULTATION DATE:  04/23/20 REFERRING MD:  Karleen Hampshire, CHIEF COMPLAINT:  Septic shock  Brief History   MSSA bacteremia +/- AC septic arthritis, septic emboli to spleen  History of present illness   Jonathan Shaffer is a 54 y/o gentleman admitted with sepsis, found to have MSSA bacteremia. He developed sudden onset nausea and vomiting and chills last week and later developed fevers and R shoulder pain. He was evaluated and felt to have a sprain. He was treated with an OP steroid injection and started oral steroids. He developed a painful rash on his hand. His progressive malaise, fevers, rash, and joint pain prompted return to the ED. At presentation he was febrile, tachycardic, tachypneic, with lactic acidosis. He was in DKA at presentation. He has a previous history of "high diabetes markers", which he reports he was not following up on. He was started on empiric vanc, zosyn & cefepime, and doxycycline. He has been evaluated by orthopaedics for possible septic arthritis of his R AC joint. ID has been consulted for MSSA+ blood cultures. No known history of IVDU or injuries prior to becoming ill. He has been encephalopathic this admission, prompting concern for septic emboli to the brain; MRI pending.  Past Medical History  Uncontrolled DM Testicular cancer; in remission HLD  Significant Hospital Events   Started vasopressors 11/20 MV replacement 11/27  Consults:  ID Ortho PCCM TCTS  Procedures:  PICC  Mechanical MV replacement 11/27  Significant Diagnostic Tests:  MRI R shoulder> septic arthritis AC joint, possible intramuscular abscess, OM distal clavicle Echocardiogram 11/19> LVEF 55 to 60%, normal valves. LUE US> no DVT MRI left wrist> cellulitis, no septic arthritis  Micro Data:  Blood culture 11/19> MSSA  2/3 Urine culture 11/19> staph aureus Blood culture 11/21> NG Shoulder culture  11/22> abundant MSSA Blood cultuers 11/23> NG Operative cultures 11/22> abundant WBC> rare MSSA  Antimicrobials:  Vanc 11/20 Zosyn 11/19 Cefepime 11/19- 11/20 Doxycycline 11/19 Nafcillin 11/20>  Interim history/subjective:  Itching improved with claritin. Remains tired. Not eating well. Ate applesauce yesterday. Was able to eat an entire banana this morning.  Objective   Blood pressure 111/73, pulse 80, temperature 97.9 F (36.6 C), temperature source Oral, resp. rate (!) 28, height 6\' 3"  (1.905 m), weight (!) 140.1 kg, SpO2 99 %.        Intake/Output Summary (Last 24 hours) at 05/03/2020 0908 Last data filed at 05/03/2020 4332 Gross per 24 hour  Intake 2994.3 ml  Output 2320 ml  Net 674.3 ml   Filed Weights   05/01/20 0500 05/02/20 0600 05/03/20 0500  Weight: (!) 137.6 kg (!) 139.2 kg (!) 140.1 kg    Examination: General: Ill-appearing middle-age man lying in the recliner no acute distress.  Slightly more awake today compared to yesterday. HENT: [place]/AT, eyes anicteric Lungs: Breathing comfortably nasal cannula, clear to auscultation anteriorly, reduced basilar breath sounds. Cardiovascular: Paced rhythm, regular rate and rhythm. Mechanical click. Abdomen: Soft, nontender Extremities: Right shoulder bandage c/d/i.  [name] on the toes. No change in appearance of left medial MTP lesion-still has hemorrhagic appearing blister. No erythema or warmth. 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  -Appreciate  ID's assistance; con't nafcillin.  -We will require 6 weeks of antibiotics per ID recommendations.  Eventually will add rifampin. -Appreciate cardiothoracic surgery's  management --Appreciate Ortho's assistance.  Concern for foot abscess that will require drainage for complete source control vs ischemic lesion. Repeat foot ultrasound today -Pharmacy consult for Coumadin -Dilaudid, oxycodone for pain control  Acute respiratory failure post-operatively, likely due to acute pulmonary edema, atelectasis, bilateral effusion -Chest tube management per primary -Diuresis & albumin -out of bed mobility, incentive spirometer -Titrate down FiO2 as able to be maintain SPO2 greater than 90%  Drug rash -Claritin daily -Transitioned morphine to Dilaudid  AKI -worsening.  FeNa 0.8 consistent with prerenal etiology.  Likely diuresed through chest tube and intravascularly depleted. -Renally dose medications, avoid nephrotoxic meds -Diuresis, albumin -Maintain MAP  >65 for adequate renal perfusion. -Strict I/Os -Checking for urine eosinophils to suggest AIN -Renal artery ultrasound to evaluate for embolism  Sinus node dysfunction -May require pacemaker generator placement if this will be chronic---appreciate EP's assistance -Continue telemetry monitoring and pacing   DM with hyperglycemia- controlled; prev in DKA. A1c 11.3 -Insulin detemir 8 units twice daily plus siding scale insulin as needed -goal BG 140-180 while critically ill -needs OP DM education and much more strict control  Acute anemia; suspect this is mixed due to critical illness and operative blood loss- stable -con't to monitor -Transfuse hemoglobin less than 7 or hemodynamically significant bleeding  Hyperbilirubinemia- worsening, unconjugated Elevated transaminases likely 2/2 sepsis and heart failure-likely due to critical illness; unclear trending up or down since recent surgery -Continue to monitor daily  Delirium- multifactorial due to sepsis, sleep disruption, ICU delirium -Out of bed mobility as able; PT consult -Adequate pain control -Continue Seroquel for insomnia  Acute  HFrEF Acute pulmonary edema Sinus arrest, pacemaker dependent -EP consult for PPM evaluation -con't diuresis -Due to Bradycardia no beta-blockers at this time -Due to AKI no ACE inhibitor, ARB, Entresto at this time  Poor PO intake, mild malnutrition -RD consult- appreciate their recommendations -Checking prealbumin  Best practice:  Diet: NPO Pain/Anxiety/Delirium protocol (if indicated): fentanyl PRN VAP protocol (if indicated): n/a DVT prophylaxis: lovenox GI prophylaxis: n/a Glucose control: basal + SSI Mobility: progressive Code Status: full Family Communication: wife updated at bedside 11/30 Disposition: ICU  Labs   CBC: Recent Labs  Lab 04/29/2020 0258 04/24/2020 0918 04/26/2020 2255 05/01/20 0350 05/01/20 1431 05/02/20 0407 05/03/20 0424  WBC 19.2*   < > 30.0* 24.8* 19.9* 26.2* 30.3*  NEUTROABS 17.5*  --   --   --   --  22.9*  --   HGB 10.3*   < > 8.0* 7.5* 8.9* 9.7* 10.1*  HCT 33.0*   < > 24.6* 22.9* 27.0* 29.1* 31.1*  MCV 94.6   < > 93.9 93.5 90.9 91.5 92.6  PLT 640*   < > 314 306 242 279 311   < > = values in this interval not displayed.    Basic Metabolic Panel: Recent Labs  Lab 04/07/2020 0228 04/28/20 0014 04/21/2020 2255 05/01/20 0350 05/01/20 1431 05/01/20 1741 05/02/20 0407 05/03/20 0424  NA 131*   < > 138 137  --  140 135 135  K 3.4*   < > 5.0 4.9  --  4.1 4.5 4.4  CL 102   < > 104 102  --  106 101 98  CO2 22   < > 20* 21*  --  21* 22 22  GLUCOSE 137*   < > 134* 137*  --  111* 128* 144*  BUN 17   < > 14 15  --  19 25* 32*  CREATININE 1.13   < > 1.67* 1.79*  --  2.02* 2.46* 2.92*  CALCIUM 7.0*   < > 7.3* 7.1*  --  6.8* 7.3* 6.9*  MG 2.2  --  2.9* 2.8* 2.6*  --  2.8*  --    < > = values in this interval not displayed.   GFR: Estimated Creatinine Clearance: 43.6 mL/min (A) (by C-G formula based on SCr of 2.92 mg/dL (H)). Recent Labs  Lab 04/18/2020 0228 04/20/2020 2201 04/28/20 0014 05/02/2020 0333 05/01/20 0350 05/01/20 1431 05/02/20 0407  05/03/20 0424  WBC   < >  --  16.0*   < > 24.8* 19.9* 26.2* 30.3*  LATICACIDVEN  --  1.5 1.4  --   --   --   --   --    < > = values in this interval not displayed.    Liver Function Tests: Recent Labs  Lab 04/20/2020 0228 04/28/20 0014 05/02/2020 2322 05/01/20 1741 05/03/20 0424  AST 20 24 22  3,257* 1,021*  ALT 21 20 14  984* 686*  ALKPHOS 73 83 85 123 372*  BILITOT 2.1* 1.7* 1.4* 2.6* 3.7*  PROT 5.3* 5.5* 5.7* 5.4* 5.6*  ALBUMIN 1.6* 1.3* 1.3* 1.8* 1.4*   No results for input(s): LIPASE, AMYLASE in the last 168 hours. No results for input(s): AMMONIA in the last 168 hours.  ABG    Component Value Date/Time   PHART 7.419 04/11/2020 1823   PCO2ART 29.2 (L) 04/04/2020 1823   PO2ART 94 04/23/2020 1823   HCO3 18.8 (L) 04/18/2020 1823   TCO2 20 (L) 04/28/2020 1823   ACIDBASEDEF 5.0 (H) 04/09/2020 1823   O2SAT 97.0 04/18/2020 1823     Coagulation Profile: Recent Labs  Lab 04/10/2020 0258 04/28/2020 1541 05/03/20 0424  INR 1.2 1.7* 2.0*    Cardiac Enzymes: No results for input(s): CKTOTAL, CKMB, CKMBINDEX, TROPONINI in the last 168 hours.  HbA1C: Hgb A1c MFr Bld  Date/Time Value Ref Range Status  04/30/2020 02:58 AM 10.8 (H) 4.8 - 5.6 % Final    Comment:    (NOTE) Pre diabetes:          5.7%-6.4%  Diabetes:              >6.4%  Glycemic control for   <7.0% adults with diabetes   04/04/2020 10:17 AM 11.3 (H) 4.8 - 5.6 % Final    Comment:    (NOTE) Pre diabetes:          5.7%-6.4%  Diabetes:              >6.4%  Glycemic control for   <7.0% adults with diabetes     CBG: Recent Labs  Lab 05/02/20 1537 05/02/20 1949 05/02/20 2336 05/03/20 0406 05/03/20 0652  GLUCAP 121* 148* 133* 124* McNairy Margrete Delude, DO 05/03/20 7:01 PM [facility]

## 2020-05-03 NOTE — Progress Notes (Addendum)
Occupational Therapy Re-evaluation andTreatment Patient Details Name: Jonathan Shaffer MRN: [id number] DOB: 03-30-1966 Today's Date: 05/03/2020    History of present illness Jonathan Shaffer is a 54 y.o. male admitted 04/11/2020 with right shoulder pain,septic arthritis AC jt and clavicular osteomyelitis, S/P Irrigation and debridement of right acromioclavicular joint including distal clavicle, acromion, AMS, fever with clinical MSSA, left sided endocarditis with septic emboli to CNS, spleen, [name] hand, foot. Underwent TEE on 04/26/2020 resulting in mitral valve vegetation and perivalvular abscess. MVR and temp pacemaker on 11/27 with thromboembolic event to bil LEs with left worse than right.  Dr. Sharol Given consulted and order compression hose and Darco shoe for left and cast shoe for right.  PT not reordered until 11/29.  Re-eval 11/30 by PT/OT.   Pt also with new onset DM2. PMH: thyroid disease, testicular cancer   OT comments  Pt continues to present with decreased strength, balance, and activity tolerance impacting his safety performance of ADLs. Pt very fatigued by activity. Requiring Mod-Max A for UB ADLs, Max-Total A for LB ADLs, and Min A +2 for functional transfers with eva walker. VSS. Goals remain appropriate. Continue to recommend dc to CIR and will continue to follow acutely as admitted.    Follow Up Recommendations  CIR    Equipment Recommendations  3 in 1 bedside commode;Tub/shower bench    Recommendations for Other Services      Precautions / Restrictions Precautions Precautions: Fall;Sternal Precaution Booklet Issued: No Precaution Comments: 2 chest tubes, temporary pacemaker Restrictions Weight Bearing Restrictions: No Other Position/Activity Restrictions: sternal precautions, Dr Sharol Given orderd Darco shoe left LE and cast shoe right LE for comfort       Mobility Bed Mobility Overal bed mobility: Needs Assistance Bed Mobility: Rolling;Sit to Sidelying Rolling: Max  assist;+2 for physical assistance       Sit to sidelying: Max assist;+2 for physical assistance;+2 for safety/equipment General bed mobility comments: Pt needed max assist for lines management, LEs movement and trunk assist to lie down.   Transfers Overall transfer level: Needs assistance Equipment used:  [place] walker) Transfers: Sit to/from Stand Sit to Stand: Min assist;From elevated surface;+2 physical assistance;+2 safety/equipment         General transfer comment: Pt attempt to stand hugging pillow and could not stand to [employer].  Second attempt had pt push up on knees and pt did much better standing to [facility] walker and stood about a minute prior to sitting down.      Balance Overall balance assessment: Needs assistance Sitting-balance support: Feet supported;No upper extremity supported Sitting balance-Leahy Scale: Fair Sitting balance - Comments: close guard assist   Standing balance support: Bilateral upper extremity supported;During functional activity Standing balance-Leahy Scale: Poor Standing balance comment: relies on UE support                           ADL either performed or assessed with clinical judgement   ADL Overall ADL's : Needs assistance/impaired         Upper Body Bathing: Moderate assistance;Sitting   Lower Body Bathing: Maximal assistance;Sit to/from stand   Upper Body Dressing : Maximal assistance;Sitting   Lower Body Dressing: Total assistance;Sit to/from stand   Toilet Transfer: +2 for physical assistance;+2 for safety/equipment;Minimal assistance (sit<>stand with [place] walker at EOB)           Functional mobility during ADLs: +2 for physical assistance;+2 for safety/equipment;Minimal assistance (sit<>stand only) General ADL Comments: Pt presenting  with poor activity tolerance limiting his functional performance     Vision       Perception     Praxis      Cognition Arousal/Alertness: Awake/alert Behavior During  Therapy: Flat affect Overall Cognitive Status: Impaired/Different from baseline Area of Impairment: Safety/judgement;Problem solving;Following commands;Memory                     Memory: Decreased short-term memory Following Commands: Follows one step commands inconsistently;Follows one step commands with increased time Safety/Judgement: Decreased awareness of safety;Decreased awareness of deficits   Problem Solving: Slow processing;Requires verbal cues;Requires tactile cues General Comments: mildly lethargic, very anxious limiting PT/OT session. Poor recall of sternal precautions        Exercises     Shoulder Instructions       General Comments SpO2 90s on 4L. HR 90-120s. RR elevating to 30-40s once back in bed as pt with increased axiety    Pertinent Vitals/ Pain       Pain Assessment: Faces Faces Pain Scale: Hurts whole lot Pain Location: generalized Pain Descriptors / Indicators: Guarding;Grimacing;Operative site guarding Pain Intervention(s): Monitored during session;Limited activity within patient's tolerance;Repositioned  Home Living                                          Prior Functioning/Environment              Frequency  Min 2X/week        Progress Toward Goals  OT Goals(current goals can now be found in the care plan section)  Progress towards OT goals: Progressing toward goals  Acute Rehab OT Goals Patient Stated Goal: to return to PLOF OT Goal Formulation: With patient Time For Goal Achievement: 05/12/20 Potential to Achieve Goals: Good ADL Goals Pt Will Perform Grooming: with min guard assist;standing Pt Will Perform Upper Body Dressing: with set-up;sitting Pt Will Perform Lower Body Dressing: with min assist;sit to/from stand Pt Will Transfer to Toilet: with min assist;ambulating;bedside commode Pt Will Perform Toileting - Clothing Manipulation and hygiene: with min assist;sit to/from stand Pt Will Perform  Tub/Shower Transfer: with min assist;ambulating;tub bench;rolling walker;Tub transfer Pt/caregiver will Perform Home Exercise Program: Increased ROM;Right Upper extremity;Independently Additional ADL Goal #1: Pt will perform bed mobility with minimum assistance in preparation for ADL.  Plan Discharge plan remains appropriate    Co-evaluation    PT/OT/SLP Co-Evaluation/Treatment: Yes Reason for Co-Treatment: For patient/therapist safety;To address functional/ADL transfers PT goals addressed during session: Mobility/safety with mobility OT goals addressed during session: ADL's and self-care      AM-PAC OT "6 Clicks" Daily Activity     Outcome Measure   Help from another person eating meals?: A Little Help from another person taking care of personal grooming?: A Little Help from another person toileting, which includes using toliet, bedpan, or urinal?: Total Help from another person bathing (including washing, rinsing, drying)?: A Lot Help from another person to put on and taking off regular upper body clothing?: A Lot Help from another person to put on and taking off regular lower body clothing?: Total 6 Click Score: 12    End of Session Equipment Utilized During Treatment: Other (comment) [place] walker)  OT Visit Diagnosis: Pain;Muscle weakness (generalized) (M62.81)   Activity Tolerance Patient tolerated treatment well   Patient Left in bed;with call bell/phone within reach;with family/visitor present   Nurse Communication Mobility status  Time: 9326-7124 OT Time Calculation (min): 23 min  Charges: OT General Charges $OT Visit: 1 Visit OT Evaluation $OT Re-eval: 1 Re-eval  [place], OTR/L Acute Rehab Pager: [phone] Office: [place] 05/03/2020, 1:38 PM

## 2020-05-03 NOTE — Consult Note (Addendum)
Cardiology Consultation:   Patient ID: Jonathan Shaffer MRN: [id number]; DOB: 1965/11/10  Admit date: 04/21/2020 Date of Consult: 05/03/2020  Primary Care Provider: Patient, No Pcp Per [facility] Cardiologist: Candee Furbish, MD  [facility] Electrophysiologist:  None    Patient Profile:   Jonathan Shaffer is a 54 y.o. male with a hx of testicular cancer, DM, and MSSA bacteremia/endocarditis who is being seen today for the evaluation of PPM/generator implant timing at the request of Dr. Orvan Seen.  History of Present Illness:   Jonathan Shaffer  recently became ill after eating shrimp cocktail (1 week prior to admission). A few days later, developed right shoulder pain. He was seen in the ER and recommended to see PCP. He ultimately received steroid injection in his right shoulder. He initially felt better, but then worsened with pain in his hips and thighs. PCP prescribed prednisone taper, but he presented to [facility]. CT abdomen showed multiple splenic infarcts. He was also found to be in DKA due to untreated DM. He has a history of testicular cancer, no IVDU.   Infection source felt to be septic arthritis of right acromioclavicular join, deep abscess right shoulder, osteomyelitis of distal clavicle, for which he underwent I&D 04/11/2020 with ortho. In addition, he was found to have left wrist cellulitis.    Due to plenic infarcts and hand/foot skin lesions, he completed TEE to rule out endocarditis.   TTE 04/09/2020 with normal EF, and no significant valvular dysfunction. 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 CT surgery was consulted. Dr. Orvan Seen requested cardiology consult for heart cath prior to surgical valve repair vs replace.  He had no obstructive CAD Underwent 04/13/2020  Mitral valve replacement with 33 mm On-X mechanical prosthesis  Implantation of permanent epicardial pacemaking system with leads on the right atrium and right ventricle  He has b/l ulcers  feet Orthopedic service consulted yesterday  Assessment: Assessment: Swelling bilateral lower extremities with possible ischemic thrombotic embolic event to both feet left worse than right. Plan: Plan: I will order knee-high compression stockings from [employer].  We will also order a Darco shoe for the left foot weightbearing as tolerated both lower extremities with physical therapy.   ID : metastatic MSSA infection transferred from [place] to [facility] for CT surgery evaluation after TEE found 4.4cm MV vegetation and perivalvular abscess.His repeat blood cultures remain clear and he is s/p valve replacement and abscess debridement 04/21/2020 # MSSA bacteremia # MV endocarditis and perivalvular abscess s/p MV replacement and debridement of MV annular abscess 04/26/2020 # Septic emboli to CNS # Septic arthritis right AC joint and OM distal clavicle and acromion and intradeltoid abscess and subacromial bursitis s/p I&D 05/01/2020. Cultures = MSSA # Splenic infarct # Left foot ecchymosis, ? Fluctuance YESTERDAY's recs Improving on therapy with Nafcillin. Bump in WBC following valve surgery as to be expected. Possible drug rash seen today.  Check differential today to look for peripheral eosinophilia. If negative would continue another 24h and follow. ?rash from sweating vs drug related rash. Long term I worry he may not be able to tolerate Nafcillin with his kidneys. Discussed with ID pharm team and likely will plan to switch to Vancomycin for better standard of care for MSSA endocarditis. Would avoid gentamicyn synergy given AKI; will consider addition of rifampin once LFTs trend down (acute from surgery/cardiac etiology).   Dr. Sharol Given evaluated L foot - felt ischemia is primary driver; d/w him and will try to decompress the edema  with close evaluation. Exam not c/w abscess. CT scan cancelled for now and will follow for changes. Unable to do MRI with epicardial wires currently.     LABS BUN/Creat 32/2.92  (unknown baseline, earlier in stay creat was 1.2-1.5) AST 1021 ALT 686 WBC 30.3 (trending up) H/H 10/31 Plts 311  INR 2.0  Pt feels "OK", tired, gets winded easily with minimal exertion, denies CP, palpitations.  Past Medical History:  Diagnosis Date  . Cancer ([place])   . Diabetes mellitus without complication ([place])   . High cholesterol   . Testicle cancer ([place])   . Thyroid disease   . Urticaria    Recurrent idiopathic urticaria in 2018    Past Surgical History:  Procedure Laterality Date  . MINOR IRRIGATION AND DEBRIDEMENT OF WOUND Right 04/17/2020   Procedure: MINOR IRRIGATION AND DEBRIDEMENT OF WOUND;  Surgeon: Leandrew Koyanagi, MD;  Location: [facility];  Service: Orthopedics;  Laterality: Right;  . MITRAL VALVE REPLACEMENT N/A 04/07/2020   Procedure: MITRAL VALVE (MV) REPLACEMENT USING ON-X MITRAL VALVE SIZE 31/33 MM,  PERMANENT EPICARDIAL PACEMAKING SYSTEM IMPLANT USING MEDTRONIC LEADS;  Surgeon: Wonda Olds, MD;  Location: [place];  Service: Open Heart Surgery;  Laterality: N/A;  . ORCHIECTOMY    . RIGHT/LEFT HEART CATH AND CORONARY ANGIOGRAPHY N/A 05/01/2020   Procedure: RIGHT/LEFT HEART CATH AND CORONARY ANGIOGRAPHY;  Surgeon: Jettie Booze, MD;  Location: [place] CV [facility];  Service: Cardiovascular;  Laterality: N/A;  . TEE WITHOUT CARDIOVERSION N/A 04/24/2020   Procedure: TRANSESOPHAGEAL ECHOCARDIOGRAM (TEE);  Surgeon: Dorothy Spark, MD;  Location: [facility];  Service: Cardiovascular;  Laterality: N/A;  . TEE WITHOUT CARDIOVERSION N/A 04/30/2020   Procedure: TRANSESOPHAGEAL ECHOCARDIOGRAM (TEE);  Surgeon: Wonda Olds, MD;  Location: [place];  Service: Open Heart Surgery;  Laterality: N/A;     Home Medications:  Prior to Admission medications   Medication Sig Start Date End Date Taking? Authorizing Provider  HYDROcodone-acetaminophen (NORCO/VICODIN) 5-325 MG tablet Take 1 tablet by mouth every 6 (six) hours as needed for  moderate pain.   Yes [provider]  lidocaine (LIDODERM) 5 % Place 1 patch onto the skin daily. Remove & Discard patch within 12 hours or as directed by MD 04/17/20  Yes Tacy Learn, PA-C  meloxicam (MOBIC) 7.5 MG tablet Take 7.5 mg by mouth daily.   Yes [provider]  predniSONE (DELTASONE) 10 MG tablet Take as directed for 12 days.  Daily dose 6,6,5,5,4,4,3,3,2,2,1,1. Patient taking differently: Take 10-60 mg by mouth See admin instructions. Take as directed for 12 days.  Daily dose 6,6,5,5,4,4,3,3,2,2,1,1. 04/21/20  Yes Hilts, Legrand Como, MD  traMADol (ULTRAM) 50 MG tablet Take 1-2 tablets (50-100 mg total) by mouth every 6 (six) hours as needed for moderate pain. 04/19/20  Yes Hilts, Michael, MD    Inpatient Medications: Scheduled Meds: . sodium chloride   Intravenous Once  . sodium chloride   Intravenous Once  . aspirin EC  81 mg Oral Daily  . atorvastatin  40 mg Oral Daily  . bisacodyl  10 mg Oral Daily   Or  . bisacodyl  10 mg Rectal Daily  . Chlorhexidine Gluconate Cloth  6 each Topical Daily  . colchicine  0.6 mg Oral BID  . docusate sodium  200 mg Oral Daily  . insulin aspart  2-6 Units Subcutaneous Q4H  . insulin detemir  8 Units Subcutaneous Q12H  . loratadine  10 mg Oral Daily  . mouth rinse  15 mL  Mouth Rinse BID  . mupirocin ointment  1 application Nasal BID  . pantoprazole  40 mg Oral Daily  . sodium chloride flush  10-40 mL Intracatheter Q12H  . sodium chloride flush  3 mL Intravenous Q12H  . warfarin  5 mg Oral ONCE-1600  . Warfarin - Pharmacist Dosing Inpatient   Does not apply q1600   Continuous Infusions: . sodium chloride Stopped (05/01/20 1540)  . sodium chloride    . sodium chloride    . albumin human    . lactated ringers    . lactated ringers Stopped (05/03/2020 1638)  . lactated ringers Stopped (05/02/20 1528)  . nafcillin (NAFCIL) continuous infusion 20.8 mL/hr at 05/03/20 1000   PRN Meds: sodium chloride, dextrose,  HYDROmorphone (DILAUDID) injection, lactated ringers, lip balm, metoprolol tartrate, ondansetron (ZOFRAN) IV, oxyCODONE, QUEtiapine, sodium chloride flush, sodium chloride flush, traMADol  Allergies:   No Known Allergies  Social History:   Social History   Socioeconomic History  . Marital status: Married    Spouse name: Not on file  . Number of children: Not on file  . Years of education: Not on file  . Highest education level: Not on file  Occupational History  . Not on file  Tobacco Use  . Smoking status: Never Smoker  . Smokeless tobacco: Never Used  Vaping Use  . Vaping Use: Never used  Substance and Sexual Activity  . Alcohol use: Yes  . Drug use: Never  . Sexual activity: Not on file  Other Topics Concern  . Not on file  Social History Narrative  . Not on file   Social Determinants of Health   Financial Resource Strain:   . Difficulty of Paying Living Expenses: Not on file  Food Insecurity:   . Worried About Charity fundraiser in the Last Year: Not on file  . Ran Out of Food in the Last Year: Not on file  Transportation Needs:   . Lack of Transportation (Medical): Not on file  . Lack of Transportation (Non-Medical): Not on file  Physical Activity:   . Days of Exercise per Week: Not on file  . Minutes of Exercise per Session: Not on file  Stress:   . Feeling of Stress : Not on file  Social Connections:   . Frequency of Communication with Friends and Family: Not on file  . Frequency of Social Gatherings with Friends and Family: Not on file  . Attends Religious Services: Not on file  . Active Member of Clubs or Organizations: Not on file  . Attends Archivist Meetings: Not on file  . Marital Status: Not on file  Intimate Partner Violence:   . Fear of Current or Ex-Partner: Not on file  . Emotionally Abused: Not on file  . Physically Abused: Not on file  . Sexually Abused: Not on file    Family History:   Family History  Problem Relation Age of  Onset  . Diabetes Father   . Hypertension Father   . Heart disease Father   . Hyperlipidemia Father      ROS:  Please see the history of present illness.  All other ROS reviewed and negative.     Physical Exam/Data:   Vitals:   05/03/20 0600 05/03/20 0700 05/03/20 0800 05/03/20 0900  BP: 111/73 111/75 115/85 120/83  Pulse: 80 92 92 92  Resp: (!) 28 14 (!) 22 15  Temp:      TempSrc:      SpO2: 99%  98% 100% 98%  Weight:      Height:        Intake/Output Summary (Last 24 hours) at 05/03/2020 1026 Last data filed at 05/03/2020 1000 Gross per 24 hour  Intake 3483.61 ml  Output 2195 ml  Net 1288.61 ml   Last 3 Weights 05/03/2020 05/02/2020 05/01/2020  Weight (lbs) 308 lb 12.8 oz 306 lb 14.1 oz 303 lb 5.7 oz  Weight (kg) 140.071 kg 139.2 kg 137.6 kg     Body mass index is 38.6 kg/m.  General:  Well nourished, well developed, in no acute distress HEENT: normal Lymph: no adenopathy Neck: no JVD Endocrine:  No thryomegaly Vascular: No carotid bruits Cardiac: RRR; valve is appreciated Lungs:  Diminished at bases b/l, good effort  Abd: soft, nontender Ext: marked b/l LE edema Musculoskeletal:  No deformities Skin: warm and dry  Neuro:  no focal abnormalities noted Psych:  Normal affect   EKG:  The EKG was personally reviewed and demonstrates:    #1 ST 133bpm 05/01/2020 junctional rhythm 50bpm, narrow QRS   Telemetry:  Telemetry was personally reviewed and demonstrates:   AP/VS   Relevant CV Studies:  CARDIAC CATH: 04/15/2020  Prox Cx lesion is 25% stenosed.  Mid LAD lesion is 10% stenosed.  LV end diastolic pressure is moderately elevated. LVEDP 29 mm Hg.  There is no aortic valve stenosis.  Hemodynamic findings consistent with mild pulmonary hypertension.  Ao sat 98%, PA sat 68%, PA pressure 41/20, mean PA 31 mm Hg; mean PCWP 28 mm Hg; CO 8.1 L/min; CI 3.2  Nonobstructive CAD.   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   Continue with plans  for mitral valve intervention with Dr. Orvan Seen.    ECHO: 04/25/2020 1. Left ventricular ejection fraction, by estimation, is 55 to 60%. The  left ventricle has normal function. The left ventricle has no regional  wall motion abnormalities. Left ventricular diastolic function could not  be evaluated.  2. The mitral valve is grossly normal. No evidence of mitral valve  regurgitation. No evidence of mitral stenosis.  3. The aortic valve is tricuspid. Aortic valve regurgitation is not  visualized. No aortic stenosis is present.   TEE: 04/30/2020 1. MITRAL VALVE: There is a large echodensity that is partially fixed (below the valve on the ventricular side and possibly representing an abscess) and  partially hypermobile, multilobar ( on the left atrial side) consistent  with a vegetation with high risk for embolization. This involves P2,3 and A3 leaflets. Vegetation size is 4.4 x 2.4 cm.  2. Left ventricular ejection fraction, by estimation, is 55 to 60%. The  left ventricle has normal function. The left ventricle has no regional  wall motion abnormalities. Left ventricular diastolic function could not  be evaluated.  3. Right ventricular systolic function is normal. The right ventricular  size is normal.  4. No left atrial/left atrial appendage thrombus was detected.  5. NO, INCORRECT The mitral valve is normal in structure. Mild mitral valve  regurgitation. No evidence of mitral stenosis.  6. The aortic valve is normal in structure. Aortic valve regurgitation is  not visualized. No aortic stenosis is present.  7. The inferior vena cava is normal in size with greater than 50%  respiratory variability, suggesting right atrial pressure of 3 mmHg.   Conclusion(s)/Recommendation(s): Normal biventricular function without  evidence of hemodynamically significant valvular heart disease. CT surgery  and hospitalist Dr Avon Gully were notified at 1:10 pm.The patient will be   transferred to  the [facility].   Laboratory Data:  High Sensitivity Troponin:  No results for input(s): TROPONINIHS in the last 720 hours.   Chemistry Recent Labs  Lab 05/01/20 1741 05/02/20 0407 05/03/20 0424  NA 140 135 135  K 4.1 4.5 4.4  CL 106 101 98  CO2 21* 22 22  GLUCOSE 111* 128* 144*  BUN 19 25* 32*  CREATININE 2.02* 2.46* 2.92*  CALCIUM 6.8* 7.3* 6.9*  GFRNONAA 38* 30* 25*  ANIONGAP 13 12 15     Recent Labs  Lab 04/26/2020 2322 05/01/20 1741 05/03/20 0424  PROT 5.7* 5.4* 5.6*  ALBUMIN 1.3* 1.8* 1.4*  AST 22 3,257* 1,021*  ALT 14 984* 686*  ALKPHOS 85 123 372*  BILITOT 1.4* 2.6* 3.7*   Hematology Recent Labs  Lab 05/01/20 1431 05/02/20 0407 05/03/20 0424  WBC 19.9* 26.2* 30.3*  RBC 2.97* 3.18* 3.36*  HGB 8.9* 9.7* 10.1*  HCT 27.0* 29.1* 31.1*  MCV 90.9 91.5 92.6  MCH 30.0 30.5 30.1  MCHC 33.0 33.3 32.5  RDW 15.7* 16.2* 16.3*  PLT 242 279 311   BNPNo results for input(s): BNP, PROBNP in the last 168 hours.  DDimer No results for input(s): DDIMER in the last 168 hours.   Radiology/Studies:  DG Chest 1 View Result Date: 05/03/2020 CLINICAL DATA:  Chest tube. Open-heart surgery. Shortness of breath. EXAM: CHEST  1 VIEW COMPARISON:  05/02/2020. FINDINGS: Left IJ sheath in stable position. Bilateral chest tubes in stable position. Cardiomegaly with bilateral mild pulmonary interstitial prominence suggesting pulmonary interstitial edema, new from prior exam. Persistent bibasilar atelectasis. No pleural effusion or pneumothorax. IMPRESSION: 1. Left IJ sheath in stable position. Bilateral chest tubes in stable position. No pneumothorax. 2. Cardiomegaly with new bilateral mild interstitial prominence suggesting pulmonary interstitial edema. Persistent bibasilar atelectasis. Electronically Signed   By: Marcello Moores  Register   On: 05/03/2020 06:25     [place] LT LOWER EXTREM LTD SOFT TISSUE NON VASCULAR Result Date: 05/01/2020 CLINICAL DATA:  Concern for abscess EXAM:  ULTRASOUND LEFT FIRST TOE TECHNIQUE: Longitudinal and transverse images of the left first toe obtained. COMPARISON:  None. FINDINGS: Along the medial aspect of the left first toe at site of palpable fullness, there is a complex fluid collection measuring 2.3 x 2.4 x 1.5 cm. No other lesion evident in this area. IMPRESSION: Apparent abscess along the medial aspect of the left first toe measuring 2.3 x 2.4 x 1.5 cm. No other lesion evident by ultrasound in this region. Electronically Signed   By: Lowella Grip III M.D.   On: 05/01/2020 13:23     Assessment and Plan:   1.  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   POD #3 MITRAL VALVE (MV) REPLACEMENT USING ON-X MITRAL VALVE SIZE 31/33 MM, PERMANENT EPICARDIAL PACEMAKING SYSTEM IMPLANT USING MEDTRONIC LEADS   Post-op junctional rhythm Chest tubes remain (planned to remove the mediastinal today it seem) Planned to start diuresis today, gently given AKI Abn LFTs Elevated ammonia (patient mentating well) Leukocytosis (trending up)\ ID following Ortho following  When I reduced pacing rate he has a low atrial/junctional rhythm 60's I will review with Dr. Rayann Heman and he will see the patient later today to decide if/when PPM generator placement should take place.  He is A pacing via temp/epicardial wires currently Discussed with Dr. Orvan Seen, he would be OK with his HR in the 60's if he had rate response, in a previously very active young  patient. Not impossible current sinus node dysfunction 2/2 swelling, though may also be 2/2 to incision in RA for his operation.   Discussed with ID NP If not felt to require generator placement urgently, watchful waiting particularly of his foot wounds and clinical course is optimal, even given that it is not an intravascular device.  They will comment as the days go by as to any ID perspective  on generator timing, as long as he has stable rhythm for now, perhaps a few more days of watching his clinical progression from ID, WBC progression is best.   For questions or updates, please contact [place] Please consult [email or web address] for contact info under    Signed, Baldwin Jamaica, PA-C  05/03/2020 10:26 AM   I have seen, examined the patient, and reviewed the above assessment and plan.  Changes to above are made where necessary.  On exam, very ill appearing,  RRR (Paced)  AV conduction is in tact.  I am optimistic that sinus function may improve.  The patient would be a poor candidate for further hardware implantation at this time. I have turned temp pacing rate down to 70 bpm.  We will continue t follow.  Co Sign: Thompson Grayer, MD 05/03/2020 10:47 PM

## 2020-05-03 NOTE — Progress Notes (Signed)
Jonathan Shaffer for Infectious Disease  Date of Admission:  04/17/2020      Total days of antibiotics 11            ASSESSMENT: Jonathan Shaffer is a 54 y.o. 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   Currently on Nafcillin since 04/23/20.  WBC has risen again today.  S/p valve replacement 04/19/2020.  Possible drug rash noted yesterday.  CBC differential without eosinophils and rash stable today/not bothering patient.  Renal function continues to worsen post-operatively and concern he may not be able to tolerate Nafcillin long term.  Other option would be vancomycin which also could be neprhotoxic  Would avoid gentamicyn synergy Shaffer AKI currently; will consider addition of rifampin once LFTs trend down (acute from surgery/cardiac etiology).   Dr. Sharol Shaffer evaluated L foot - felt ischemia is primary driver --  will try to decompress the edema with close evaluation. Repeating US today.   PLAN: 1. Continue nafcillin for now 2. Evaluate for AIN.  Consider nephrology eval 3. Consider renal US.  ? Embolic event to kidneys 4. Repeat left foot [place].  Ortho following 5. Hold on addition of gent / rifampin Shaffer AKI/LFTs 6. EP eval noted.  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 sodium chloride   Intravenous Once  . sodium chloride   Intravenous Once  . aspirin EC  81 mg Oral Daily   . atorvastatin  40 mg Oral Daily  . bisacodyl  10 mg Oral Daily   Or  . bisacodyl  10 mg Rectal Daily  . Chlorhexidine Gluconate Cloth  6 each Topical Daily  . colchicine  0.6 mg Oral BID  . docusate sodium  200 mg Oral Daily  . insulin aspart  2-6 Units Subcutaneous Q4H  . insulin detemir  8 Units Subcutaneous Q12H  . loratadine  10 mg Oral Daily  . mouth rinse  15 mL Mouth Rinse BID  . mupirocin ointment  1 application Nasal BID  . pantoprazole  40 mg Oral Daily  . sodium chloride flush  10-40 mL Intracatheter Q12H  . sodium chloride flush  3 mL Intravenous Q12H  . warfarin  5 mg Oral ONCE-1600  . Warfarin - Pharmacist Dosing Inpatient   Does not apply q1600    SUBJECTIVE: Feels tired today.  Compression stalkings are making his legs hot.  Not itching.  Pain is controlled.  Breathing is stable.     Review of Systems: Review of Systems  Constitutional: Negative for chills, fever, malaise/fatigue and weight loss.  HENT: Negative for sore throat.   Respiratory: Negative for cough, sputum production and shortness of breath.   Gastrointestinal: Negative for abdominal pain, diarrhea and vomiting.  Musculoskeletal: Positive for joint pain (Foot pain L>R). Negative for myalgias and neck pain.  Skin: Positive for rash. Negative for itching.  Neurological: Negative for headaches.  Psychiatric/Behavioral: Negative for depression and substance abuse. The patient is not nervous/anxious.     No Known Allergies  OBJECTIVE: Vitals:   05/03/20 0800 05/03/20 0900 05/03/20 1000 05/03/20 1114  BP: 115/85 120/83 115/83 134/80  Pulse: 92 92 92 92  Resp: (!) 22 15 (!) 28 (!) 33  Temp:    97.8 F (36.6 C)  TempSrc:    Oral  SpO2: 100% 98% 98% 100%  Weight:      Height:       Body mass index is 38.6 kg/m.  Physical Exam Constitutional:      Appearance: He is well-developed.     Comments: Tired appearing.   HENT:     Head: Normocephalic and atraumatic.     Mouth/Throat:      Mouth: Mucous membranes are moist.     Dentition: Normal dentition. No dental abscesses.     Pharynx: No oropharyngeal exudate.  Eyes:     General: No scleral icterus. Cardiovascular:     Rate and Rhythm: Normal rate and regular rhythm.  Pulmonary:     Effort: Pulmonary effort is normal.     Breath sounds: Normal breath sounds.  Chest:     Comments: Midsternal incision dressed and clean.  Abdominal:     General: Bowel sounds are normal. There is no distension.     Palpations: Abdomen is soft.     Tenderness: There is no abdominal tenderness.  Musculoskeletal:     Comments: Legs in compression stockings.   Skin:    General: Skin is warm and dry.     Findings: No rash.     Comments: Blanchable pink/red rash noted on patients upper back and axilla not progressing.    Neurological:     Mental Status: He is alert and oriented to person, place, and time.  Psychiatric:        Judgment: Judgment normal.     Lab Results Lab Results  Component Value Date   WBC 30.3 (H) 05/03/2020   HGB 10.1 (L) 05/03/2020   HCT 31.1 (L) 05/03/2020   MCV 92.6 05/03/2020   PLT 311 05/03/2020    Lab Results  Component Value Date   CREATININE 2.92 (H) 05/03/2020   BUN 32 (H) 05/03/2020   NA 135 05/03/2020   K 4.4 05/03/2020   CL 98 05/03/2020   CO2 22 05/03/2020    Lab Results  Component Value Date   ALT 686 (H) 05/03/2020   AST 1,021 (H) 05/03/2020   ALKPHOS 372 (H) 05/03/2020   BILITOT 3.7 (H) 05/03/2020     Microbiology: Recent Results (from the past 240 hour(s))  Culture, blood (Routine X 2) w Reflex to ID Panel     Status: None   Collection Time: 04/24/20  7:52 AM   Specimen: BLOOD LEFT HAND  Result Value Ref Range Status   Specimen Description   Final    BLOOD LEFT HAND Performed at [facility], [place] [address], [place], [place] [address]    Special Requests   Final    BOTTLES DRAWN AEROBIC AND ANAEROBIC BLOOD LEFT HAND Performed at [facility] [address] [place], [place] [address]    Culture   Final    NO GROWTH 5 DAYS Performed at [facility], [place] [address], [place], [place] [address]    Report Status 04/26/2020 FINAL  Final  Culture, blood (Routine X 2) w Reflex to ID Panel     Status: None  Collection Time: 04/24/20  7:52 AM   Specimen: BLOOD LEFT HAND  Result Value Ref Range Status   Specimen Description   Final    BLOOD LEFT HAND Performed at [facility], [place] [address] [place], [place] [address]    Special Requests   Final    BOTTLES DRAWN AEROBIC AND ANAEROBIC BLOOD LEFT HAND Performed at [facility] [address] [place], [place] [address]    Culture   Final    NO GROWTH 5 DAYS Performed at [facility], [place] [address] [place], [place] [address]    Report Status 04/15/2020 FINAL  Final  Aerobic/Anaerobic Culture (surgical/deep wound)     Status: None   Collection Time: 04/21/2020  2:48 PM   Specimen: PATH Other; Tissue  Result Value Ref Range Status   Specimen Description   Final    ABSCESS SHOULDER RIGHT Performed at [facility] [address], [place], [place] [address]    Special Requests   Final    NONE Performed at [facility], [place] [address] [place], [place] [address]    Gram Stain   Final    FEW WBC PRESENT, PREDOMINANTLY PMN ABUNDANT GRAM POSITIVE COCCI    Culture   Final    MODERATE STAPHYLOCOCCUS AUREUS NO ANAEROBES ISOLATED Performed at [facility], [place] [address], [place], [place] [address]    Report Status 05/01/2020 FINAL  Final   Organism ID, Bacteria STAPHYLOCOCCUS AUREUS  Final      Susceptibility   Staphylococcus aureus - MIC*    CIPROFLOXACIN <=0.5 SENSITIVE Sensitive     ERYTHROMYCIN <=0.25 SENSITIVE Sensitive     GENTAMICIN <=0.5 SENSITIVE Sensitive     OXACILLIN 0.5 SENSITIVE Sensitive     TETRACYCLINE <=1 SENSITIVE Sensitive     VANCOMYCIN 1 SENSITIVE Sensitive      TRIMETH/SULFA <=10 SENSITIVE Sensitive     CLINDAMYCIN <=0.25 SENSITIVE Sensitive     RIFAMPIN <=0.5 SENSITIVE Sensitive     Inducible Clindamycin NEGATIVE Sensitive     * MODERATE STAPHYLOCOCCUS AUREUS  Aerobic/Anaerobic Culture (surgical/deep wound)     Status: None   Collection Time: 04/30/2020  2:58 PM   Specimen: PATH Other; Tissue  Result Value Ref Range Status   Specimen Description   Final    TISSUE SHOULDER RIGHT Performed at [facility] [address], [place], [place] [address]    Special Requests   Final    NONE Performed at [facility], [place] [address] [place], [place] [address]    Gram Stain   Final    ABUNDANT WBC PRESENT, PREDOMINANTLY PMN ABUNDANT GRAM POSITIVE COCCI    Culture   Final    ABUNDANT STAPHYLOCOCCUS AUREUS NO ANAEROBES ISOLATED Performed at [facility], [place] [address], [place], [place] [address]    Report Status 05/01/2020 FINAL  Final   Organism ID, Bacteria STAPHYLOCOCCUS AUREUS  Final      Susceptibility   Staphylococcus aureus - MIC*    CIPROFLOXACIN <=0.5 SENSITIVE Sensitive     ERYTHROMYCIN <=0.25 SENSITIVE Sensitive     GENTAMICIN <=0.5 SENSITIVE Sensitive     OXACILLIN 0.5 SENSITIVE Sensitive     TETRACYCLINE <=1 SENSITIVE Sensitive     VANCOMYCIN <=0.5 SENSITIVE Sensitive     TRIMETH/SULFA <=10 SENSITIVE Sensitive     CLINDAMYCIN <=0.25 SENSITIVE Sensitive     RIFAMPIN <=0.5 SENSITIVE Sensitive     Inducible Clindamycin NEGATIVE  Sensitive     * ABUNDANT STAPHYLOCOCCUS AUREUS  Culture, blood (Routine X 2) w Reflex to ID Panel     Status: None   Collection Time: 04/26/20  9:58 AM   Specimen: BLOOD RIGHT HAND  Result Value Ref Range Status   Specimen Description   Final    BLOOD RIGHT HAND Performed at [facility] [address], [place], [place] [address]    Special Requests   Final    BOTTLES DRAWN AEROBIC ONLY Blood Culture results may not be optimal due to an  inadequate volume of blood received in culture bottles Performed at [facility] [address] [place], [place] [address]    Culture   Final    NO GROWTH 5 DAYS Performed at [facility], [place] [address] [place], [place] [address]    Report Status 05/01/2020 FINAL  Final  Culture, blood (Routine X 2) w Reflex to ID Panel     Status: None   Collection Time: 04/26/20  9:58 AM   Specimen: BLOOD RIGHT HAND  Result Value Ref Range Status   Specimen Description   Final    BLOOD RIGHT HAND Performed at [facility] [address] [place], [place] [address]    Special Requests   Final    BOTTLES DRAWN AEROBIC ONLY Blood Culture results may not be optimal due to an inadequate volume of blood received in culture bottles Performed at [facility] [address] [place], [place] [address]    Culture   Final    NO GROWTH 5 DAYS Performed at [facility], [place] [address] [place], [place] [address]    Report Status 05/01/2020 FINAL  Final  Surgical pcr screen     Status: Abnormal   Collection Time: 04/24/2020  2:39 AM   Specimen: Nasal Mucosa; Nasal Swab  Result Value Ref Range Status   MRSA, PCR NEGATIVE NEGATIVE Final   Staphylococcus aureus POSITIVE (A) NEGATIVE Final    Comment: (NOTE) The Xpert SA Assay (FDA approved for NASAL specimens in patients 8 years of age and older), is one component of a comprehensive surveillance program. It is not intended to diagnose infection nor to guide or monitor treatment. Performed at [facility], [place] [address] [place], [place] [address]   Aerobic/Anaerobic Culture (surgical/deep wound)     Status: None (Preliminary result)   Collection Time: 04/12/2020 11:01 AM   Specimen: Other Source  Result Value Ref Range Status   Specimen Description TISSUE  Final   Special Requests   Final    MITRAL VALVE VEGETATION SPEC A ANTIBIOTICS ZINACEF VANCOMYCIN   Gram Stain   Final      ABUNDANT WBC PRESENT, PREDOMINANTLY PMN NO ORGANISMS SEEN Performed at [facility], [place] [address], [place], [place] [address]    Culture   Final    RARE STAPHYLOCOCCUS AUREUS CRITICAL RESULT CALLED TO, READ BACK BY AND VERIFIED WITH: RN Mellissa Kohut [id number] AT 65 BY CM NO ANAEROBES ISOLATED; CULTURE IN PROGRESS FOR 5 DAYS    Report Status PENDING  Incomplete   Organism ID, Bacteria STAPHYLOCOCCUS AUREUS  Final      Susceptibility   Staphylococcus aureus - MIC*    CIPROFLOXACIN <=0.5 SENSITIVE Sensitive     ERYTHROMYCIN <=0.25 SENSITIVE Sensitive     GENTAMICIN <=0.5 SENSITIVE Sensitive     OXACILLIN 0.5 SENSITIVE Sensitive     TETRACYCLINE <=1 SENSITIVE Sensitive  VANCOMYCIN 1 SENSITIVE Sensitive     TRIMETH/SULFA <=10 SENSITIVE Sensitive     CLINDAMYCIN <=0.25 SENSITIVE Sensitive     RIFAMPIN <=0.5 SENSITIVE Sensitive     Inducible Clindamycin NEGATIVE Sensitive     * RARE STAPHYLOCOCCUS AUREUS      Raynelle Highland for Infectious Disease [facility] 05/03/2020, 12:28 PM

## 2020-05-03 NOTE — Progress Notes (Signed)
Renal artery  has been completed. Refer to [facility] under chart review to view preliminary results.   05/03/2020  3:39 PM [place] , Bonnye Fava

## 2020-05-03 NOTE — Progress Notes (Addendum)
Initial Nutrition Assessment  DOCUMENTATION CODES:   Not applicable  INTERVENTION:   Ensure Enlive po BID, each supplement provides 350 kcal and 20 grams of protein  Magic cup BID with meals, each supplement provides 290 kcal and 9 grams of protein  MVI with minerals   Encourage po intake    NUTRITION DIAGNOSIS:   Inadequate oral intake related to decreased appetite as evidenced by per patient/family report, meal completion < 50%.   GOAL:   Patient will meet greater than or equal to 90% of their needs   MONITOR:   PO intake, Supplement acceptance, Labs, Weight trends  REASON FOR ASSESSMENT:   Consult Poor PO, Calorie Count  ASSESSMENT:   Pt admitted with right shoulder pain, septic arthritis. Found to have mitral valve endocarditis due to MSSA bacteremia. MVR and temp pacemaker on 11/27. PMH of thyroid disease and testicular cancer. Pt with new onset of DM2.   Spoke with pt and wife at bedside. Wife reports declining po intake post shoulder surgery. Reports a banana for breakfast today and a few bites of food yesterday for intake.   Sickness onset 3 weeks ago after consuming shrimp cocktail at restaurant. Experienced vomiting following shrimp consumption. Pt thought it was possible food poisoning. Pt then experienced R shoulder pain a few days later.  Declining intake after this incident. Intake is very off and on but he had one week PTA that he could tolerate nothing, not even broth. After shoulder surgery during admission, pt po intake increased then decreased again after MVR. Intake continues to be poor.   Noted A1C of 10.8, once pt is more stable, plan diet education.   Per meal completion reports, pt consumed 0-100% of last 7 meal records with an average of 49%. Within the past two days, intake is 0-25%.   Pt reports his UBW is around 255-260 lbs. Pt stated he intentionally lost 25 lbs recently PTA d/t lifestyle changes of consuming smaller portion sizes and  drinking more water. Weight this admission trending up because of edema.  Labs reviewed: CBG's: 119-148, Hb A1C: 10.8  Medications reviewed and include: dolcolax, colace, SSI, levemir, protonix, coumadin   NUTRITION - FOCUSED PHYSICAL EXAM:    Most Recent Value  Orbital Region No depletion  Upper Arm Region No depletion  Thoracic and Lumbar Region No depletion  Buccal Region No depletion  Temple Region Mild depletion  Clavicle Bone Region No depletion  Clavicle and Acromion Bone Region No depletion  Scapular Bone Region No depletion  Dorsal Hand No depletion  Patellar Region Unable to assess  Anterior Thigh Region Unable to assess  Posterior Calf Region Unable to assess  Edema (RD Assessment) Severe  Hair Reviewed  Eyes Reviewed  Mouth Reviewed  Skin Reviewed  Nails Reviewed       Diet Order:   Diet Order            Diet NPO time specified  Diet effective midnight           Diet Heart Room service appropriate? Yes; Fluid consistency: Thin  Diet effective now                 EDUCATION NEEDS:   Education needs have been addressed  Skin:  Skin Assessment: Reviewed RN Assessment  Last BM:  11/27  Height:   Ht Readings from Last 1 Encounters:  05/01/2020 6\' 3"  (1.905 m)    Weight:   Wt Readings from Last 1 Encounters:  05/03/20 (!) 140.1  kg    Ideal Body Weight:  89 kg  BMI:  Body mass index is 38.6 kg/m.  Estimated Nutritional Needs:   Kcal:  2400-2600  Protein:  120-130  Fluid:  2 L/day    Ronnald Nian, Dietetic Intern Pager: [phone] If unavailable: [phone]

## 2020-05-03 NOTE — Progress Notes (Signed)
Physical Therapy Re-evaluation Patient Details Name: Jonathan Shaffer MRN: [id number] DOB: 11-28-1965 Today's Date: 05/03/2020    History of Present Illness Jonathan Shaffer is a 54 y.o. 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 Underwent TEE on 04/12/2020 resulting in mitral valve vegetation and perivalvular abscess. MVR and temp pacemaker on 11/27 with thromboembolic event to bil LEs with left worse than right.  Dr. Sharol Given consulted and order compression hose and Darco shoe for left and cast shoe for right.  PT not reordered until 11/29.  Re-eval 11/30 by PT/OT.   Pt also with new onset DM2. PMH: thyroid disease, testicular cancer    PT Comments    Pt admitted with above diagnosis. Pt was able to stand to [facility] walker for up to a minute with ming uard assist once standing.  Overall, max assist of 2  for bed mobility and mod assist of 2 for transfers.  Pt self limiting due to fatigue and pain. Pt  Anxious as well needing max encouragement for participation. Updated d/c plan to CIR as feel pt may need incr therapy prior to d/c home.  Goals still appropriate.  Will follow acutely. Pt currently with functional limitations due to balance and endurance deficits. Pt will benefit from skilled PT to increase their independence and safety with mobility to allow discharge to the venue listed below.    Follow Up Recommendations  CIR;Supervision/Assistance - 24 hour     Equipment Recommendations  Rolling walker with 5" wheels;3in1 (PT)    Recommendations for Other Services OT consult     Precautions / Restrictions Precautions Precautions: Fall;Sternal Precaution Booklet Issued: No Precaution Comments: 2 chest tubes, temporary  pacemaker Restrictions Weight Bearing Restrictions: No Other Position/Activity Restrictions: sternal precautions, Dr Sharol Given orderd Darco shoe left LE and cast shoe right LE for comfort    Mobility  Bed Mobility Overal bed mobility: Needs Assistance Bed Mobility: Rolling;Sit to Sidelying Rolling: Max assist;+2 for physical assistance       Sit to sidelying: Max assist;+2 for physical assistance;+2 for safety/equipment General bed mobility comments: Pt needed max assist for lines management, LEs movement and trunk assist to lie down.   Transfers Overall transfer level: Needs assistance Equipment used:  [place] walker) Transfers: Sit to/from Stand Sit to Stand: Min assist;From elevated surface;+2 physical assistance;+2 safety/equipment         General transfer comment: Pt attempt to stand hugging pillow and could not stand to [employer].  Second attempt had pt push up on knees and pt did much better standing to [facility] walker and stood about a minute prior to sitting down.    Ambulation/Gait             General Gait Details: Pt too fatigued per pt and wife.    Stairs             Wheelchair Mobility    Modified Rankin (Stroke Patients Only)       Balance Overall balance assessment: Needs assistance Sitting-balance support: Feet supported;No upper extremity supported Sitting balance-Leahy Scale: Fair Sitting balance - Comments: close guard assist   Standing balance support: Bilateral upper extremity supported;During functional activity Standing balance-Leahy Scale: Poor Standing balance comment: relies on UE support  Cognition Arousal/Alertness: Awake/alert Behavior During Therapy: Flat affect Overall Cognitive Status: Impaired/Different from baseline Area of Impairment: Safety/judgement;Problem solving;Following commands;Memory                     Memory: Decreased short-term memory Following Commands: Follows one  step commands inconsistently;Follows one step commands with increased time Safety/Judgement: Decreased awareness of safety;Decreased awareness of deficits   Problem Solving: Slow processing;Requires verbal cues;Requires tactile cues General Comments: mildly lethargic, very anxious limiting PT/OT session      Exercises      General Comments General comments (skin integrity, edema, etc.): SpO2 90s on 4L. HR 90-120s. RR elevating to 30-40s once back in bed as pt with increased axiety      Pertinent Vitals/Pain Pain Assessment: Faces Faces Pain Scale: Hurts whole lot Pain Location: generalized Pain Descriptors / Indicators: Guarding;Grimacing;Operative site guarding Pain Intervention(s): Limited activity within patient's tolerance;Monitored during session;Repositioned    Home Living                      Prior Function            PT Goals (current goals can now be found in the care plan section) Progress towards PT goals: Progressing toward goals    Frequency    Min 3X/week      PT Plan Discharge plan needs to be updated    Co-evaluation PT/OT/SLP Co-Evaluation/Treatment: Yes Reason for Co-Treatment: For patient/therapist safety PT goals addressed during session: Mobility/safety with mobility        AM-PAC PT "6 Clicks" Mobility   Outcome Measure  Help needed turning from your back to your side while in a flat bed without using bedrails?: A Lot Help needed moving from lying on your back to sitting on the side of a flat bed without using bedrails?: A Lot Help needed moving to and from a bed to a chair (including a wheelchair)?: A Lot Help needed standing up from a chair using your arms (e.g., wheelchair or bedside chair)?: A Lot Help needed to walk in hospital room?: Total Help needed climbing 3-5 steps with a railing? : Total 6 Click Score: 10    End of Session Equipment Utilized During Treatment: Gait belt;Oxygen Activity Tolerance: Patient limited by  fatigue;Patient limited by pain Patient left: with call bell/phone within reach;in bed;with bed alarm set;with family/visitor present Nurse Communication: Mobility status PT Visit Diagnosis: Difficulty in walking, not elsewhere classified (R26.2);Pain Pain - Right/Left: Right Pain - part of body: Shoulder (generalized)     Time: 4917-9150 PT Time Calculation (min) (ACUTE ONLY): 23 min  Charges:     Re-evaluation: 8-22 mins                   Vada Swift W,PT Acute Rehabilitation Services Pager:  [phone]  Office:  [place] 05/03/2020, 12:28 PM

## 2020-05-03 NOTE — Progress Notes (Addendum)
ANTICOAGULATION CONSULT NOTE - Initial Consult Addendum: Patient will received 2.5mg  x1 today. Patient's LFTs and INR are both acutely elevated due to current infection and post-operative stress. Will continue with lower warfarin doses until patient is more stable.   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   No Known Allergies  Patient Measurements: Height: 6\' 3"  (190.5 cm) Weight: (!) 140.1 kg (308 lb 12.8 oz) IBW/kg (Calculated) : 84.5  Vital Signs: Temp: 97.9 F (36.6 C) (11/30 0408) Temp Source: Oral (11/30 0408) BP: 111/73 (11/30 0600) Pulse Rate: 80 (11/30 0600)  Labs: Recent Labs    04/07/2020 1541 04/21/2020 1704 05/01/20 0350 05/01/20 1431 05/01/20 1431 05/01/20 1741 05/02/20 0407 05/03/20 0424  HGB 7.8*  6.8*   < >  --  8.9*   < >  --  9.7* 10.1*  HCT 23.0*  22.0*   < >  --  27.0*  --   --  29.1* 31.1*  PLT 389   < >  --  242  --   --  279 311  APTT 37*  --   --   --   --   --   --   --   LABPROT 18.9*  --   --   --   --   --   --  22.4*  INR 1.7*  --   --   --   --   --   --  2.0*  CREATININE  --    < >   < >  --   --  2.02* 2.46* 2.92*   < > = values in this interval not displayed.    Estimated Creatinine Clearance: 43.6 mL/min (A) (by C-G formula based on SCr of 2.92 mg/dL (H)).   Medical History: Past Medical History:  Diagnosis Date  . Cancer ([place])   . Diabetes mellitus without complication ([place])   . High cholesterol   . 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 Thyroid disease   . 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 Hgb improving post-op, currently up to 10.1, plt wnl. No bleeding noted. INR this AM is 2.0 which is below goal, but up from 1.7 after initial dose of 5mg  on 11/29. Patient's LFTs remain elevated indicating acute liver dysfunction that could be impacting INR.   Goal of Therapy:  INR goal 2.5-3.5 Monitor platelets by  anticoagulation protocol: Yes   Plan:  Warfarin 2.5mg  x1 today  Daily INR Warfarin education prior to discharge  Cephus Slater, PharmD, [facility] Resident [phone] 05/03/2020 7:46 AM

## 2020-05-03 NOTE — Progress Notes (Signed)
Inpatient Rehab Admissions Coordinator Note:   Per therapy recommendations, pt was screened for CIR candidacy by Katonya Blecher, MS CCC-SLP. At this time, Pt. Appears to have functional decline and is a good candidate for CIR. Will pursue order for rehab consult per protocol.  Please contact me with questions.   Londen Lorge, MS, CCC-SLP Rehab Admissions Coordinator  [phone] (celll) [phone] (office)   

## 2020-05-03 NOTE — Progress Notes (Signed)
Patient ID: Jonathan Shaffer, male   DOB: June 03, 1966, 54 y.o.   MRN: [id number] [facility] Evening Rounds:  Hemodynamically stable  Atrial pacing. sats 100% on 3L [place]  Excellent urine output.   Seen by EP today for consideration of PPM.

## 2020-05-04 ENCOUNTER — Inpatient Hospital Stay (HOSPITAL_COMMUNITY): Payer: BC Managed Care – PPO

## 2020-05-04 DIAGNOSIS — L02413 Cutaneous abscess of right upper limb: Secondary | ICD-10-CM | POA: Diagnosis not present

## 2020-05-04 LAB — CYTOLOGY - NON PAP

## 2020-05-04 LAB — CBC
HCT: 28.9 % — ABNORMAL LOW (ref 39.0–52.0)
Hemoglobin: 9.4 g/dL — ABNORMAL LOW (ref 13.0–17.0)
MCH: 30 pg (ref 26.0–34.0)
MCHC: 32.5 g/dL (ref 30.0–36.0)
MCV: 92.3 fL (ref 80.0–100.0)
Platelets: 286 10*3/uL (ref 150–400)
RBC: 3.13 MIL/uL — ABNORMAL LOW (ref 4.22–5.81)
RDW: 16.8 % — ABNORMAL HIGH (ref 11.5–15.5)
WBC: 26.2 10*3/uL — ABNORMAL HIGH (ref 4.0–10.5)
nRBC: 0.7 % — ABNORMAL HIGH (ref 0.0–0.2)

## 2020-05-04 LAB — GLUCOSE, CAPILLARY
Glucose-Capillary: 109 mg/dL — ABNORMAL HIGH (ref 70–99)
Glucose-Capillary: 115 mg/dL — ABNORMAL HIGH (ref 70–99)
Glucose-Capillary: 128 mg/dL — ABNORMAL HIGH (ref 70–99)
Glucose-Capillary: 134 mg/dL — ABNORMAL HIGH (ref 70–99)
Glucose-Capillary: 138 mg/dL — ABNORMAL HIGH (ref 70–99)
Glucose-Capillary: 159 mg/dL — ABNORMAL HIGH (ref 70–99)

## 2020-05-04 LAB — BASIC METABOLIC PANEL
Anion gap: 13 (ref 5–15)
Anion gap: 12 (ref 5–15)
BUN: 34 mg/dL — ABNORMAL HIGH (ref 6–20)
BUN: 33 mg/dL — ABNORMAL HIGH (ref 6–20)
CO2: 23 mmol/L (ref 22–32)
CO2: 24 mmol/L (ref 22–32)
Calcium: 6.9 mg/dL — ABNORMAL LOW (ref 8.9–10.3)
Calcium: 7 mg/dL — ABNORMAL LOW (ref 8.9–10.3)
Chloride: 100 mmol/L (ref 98–111)
Chloride: 99 mmol/L (ref 98–111)
Creatinine, Ser: 3.07 mg/dL — ABNORMAL HIGH (ref 0.61–1.24)
Creatinine, Ser: 3 mg/dL — ABNORMAL HIGH (ref 0.61–1.24)
GFR, Estimated: 23 mL/min — ABNORMAL LOW (ref >60)
GFR, Estimated: 24 mL/min — ABNORMAL LOW (ref >60)
Glucose, Bld: 143 mg/dL — ABNORMAL HIGH (ref 70–99)
Glucose, Bld: 164 mg/dL — ABNORMAL HIGH (ref 70–99)
Potassium: 3.8 mmol/L (ref 3.5–5.1)
Potassium: 3.3 mmol/L — ABNORMAL LOW (ref 3.5–5.1)
Sodium: 136 mmol/L (ref 135–145)
Sodium: 135 mmol/L (ref 135–145)

## 2020-05-04 LAB — HEPATIC FUNCTION PANEL
ALT: 517 U/L — ABNORMAL HIGH (ref 0–44)
AST: 480 U/L — ABNORMAL HIGH (ref 15–41)
Albumin: 1.7 g/dL — ABNORMAL LOW (ref 3.5–5.0)
Alkaline Phosphatase: 348 U/L — ABNORMAL HIGH (ref 38–126)
Bilirubin, Direct: 1.3 mg/dL — ABNORMAL HIGH (ref 0.0–0.2)
Indirect Bilirubin: 1.8 mg/dL — ABNORMAL HIGH (ref 0.3–0.9)
Total Bilirubin: 3.1 mg/dL — ABNORMAL HIGH (ref 0.3–1.2)
Total Protein: 5.9 g/dL — ABNORMAL LOW (ref 6.5–8.1)

## 2020-05-04 LAB — PROTIME-INR
INR: 4.6 (ref 0.8–1.2)
Prothrombin Time: 42.2 seconds — ABNORMAL HIGH (ref 11.4–15.2)

## 2020-05-04 IMAGING — DX DG ABDOMEN 1V
3 series · 3 of 3 positions shown · non-contrast
Comparison: CT abdomen and pelvis [DATE]

CLINICAL DATA: Occluded

EXAM:
ABDOMEN - 1 VIEW

[abdomen supine (1 of 3)]
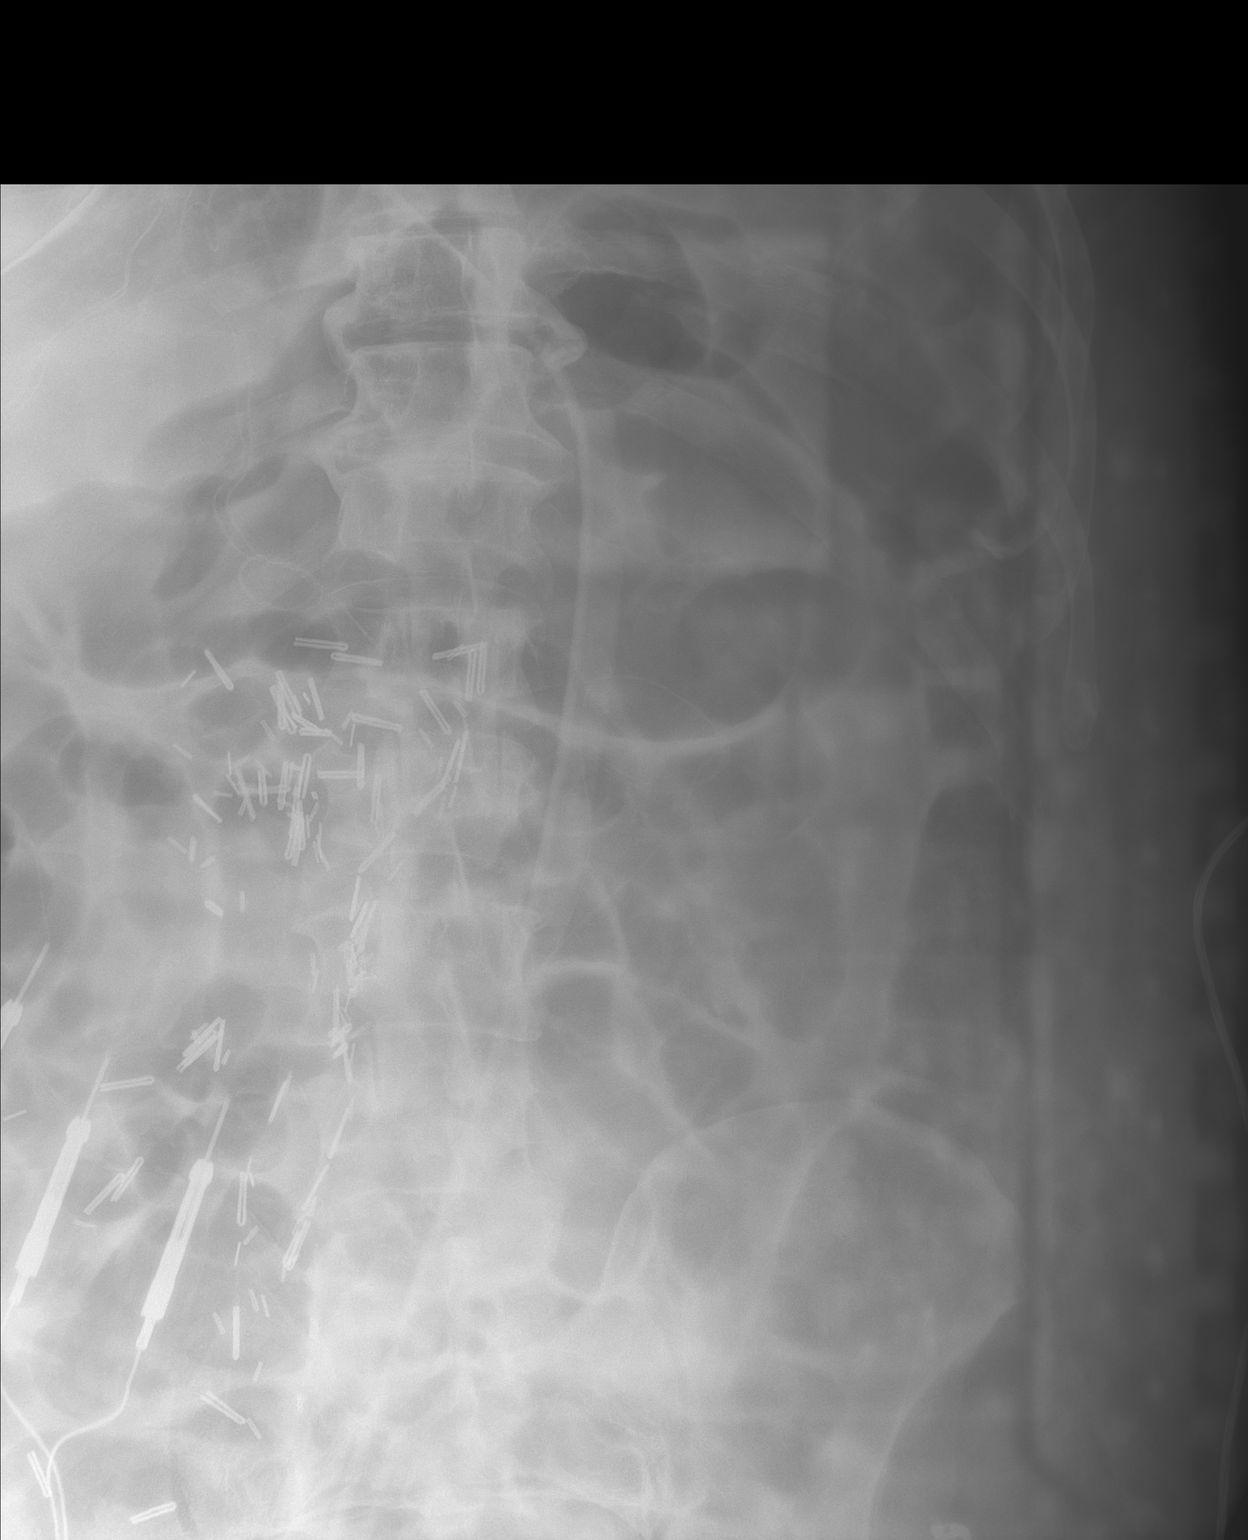

[abdomen supine (2 of 3)]
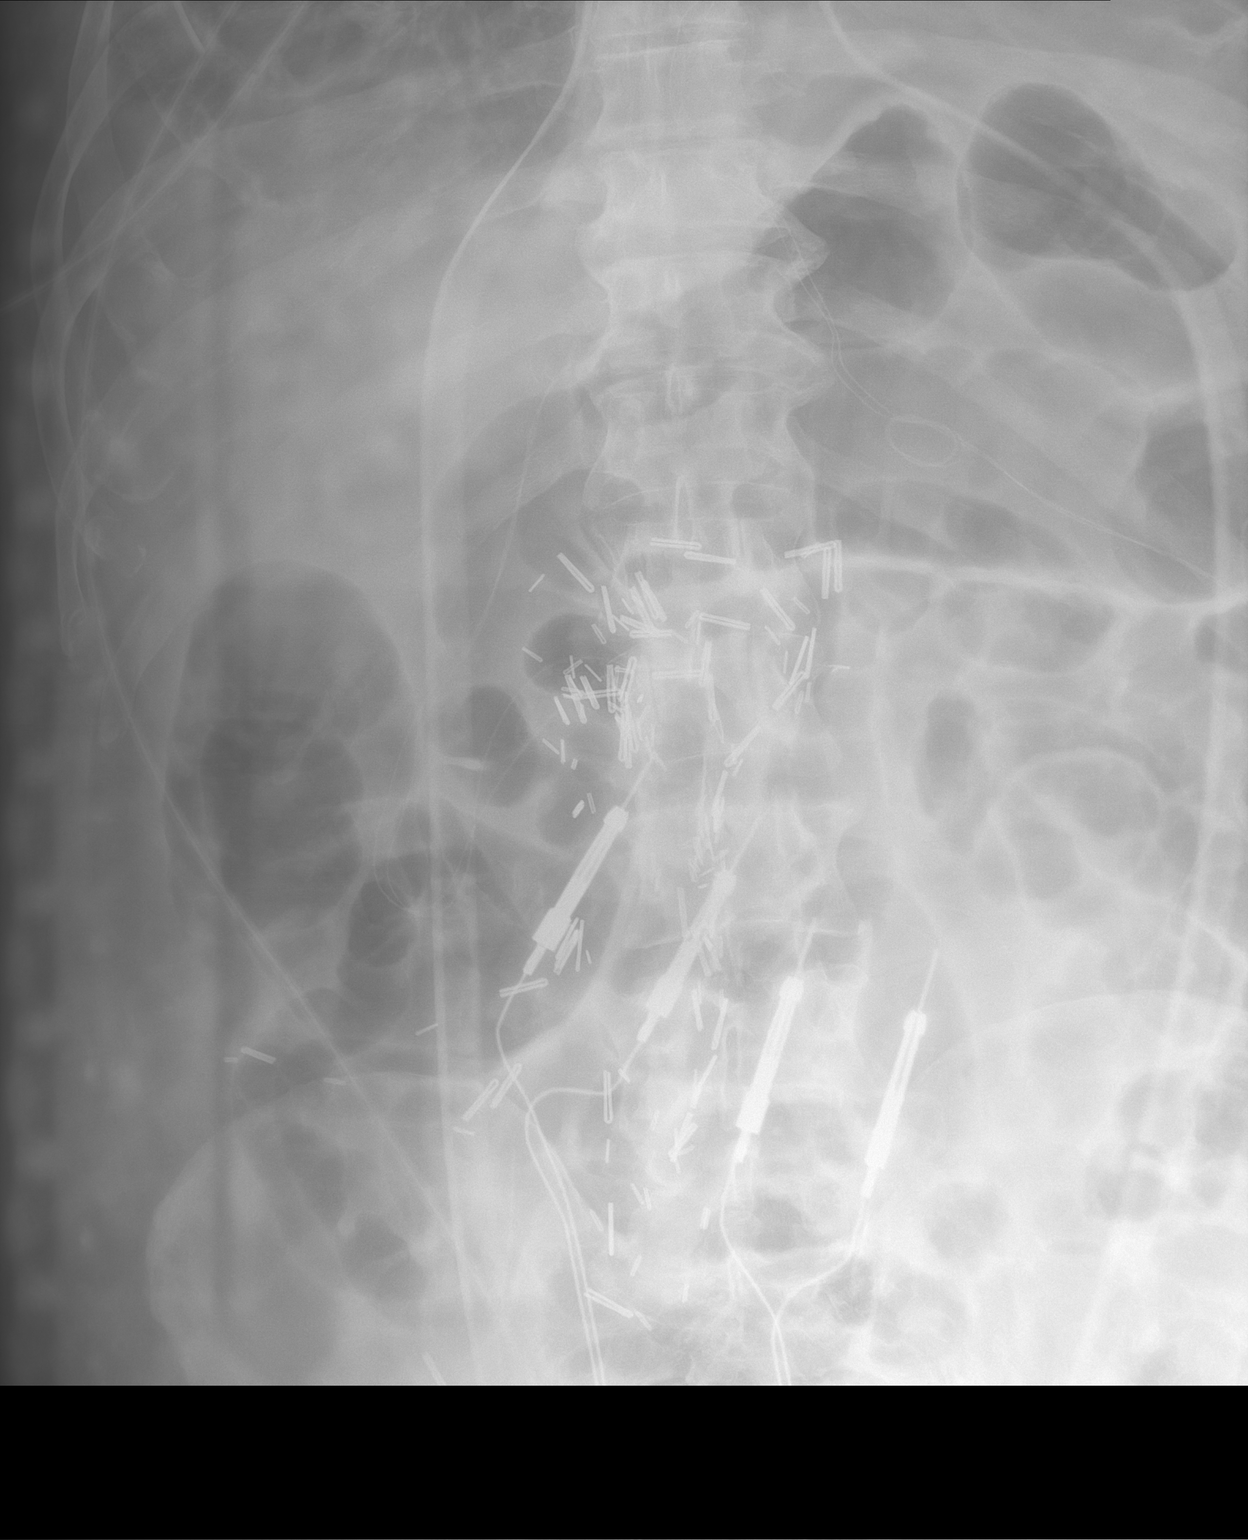

[abdomen supine (3 of 3)]
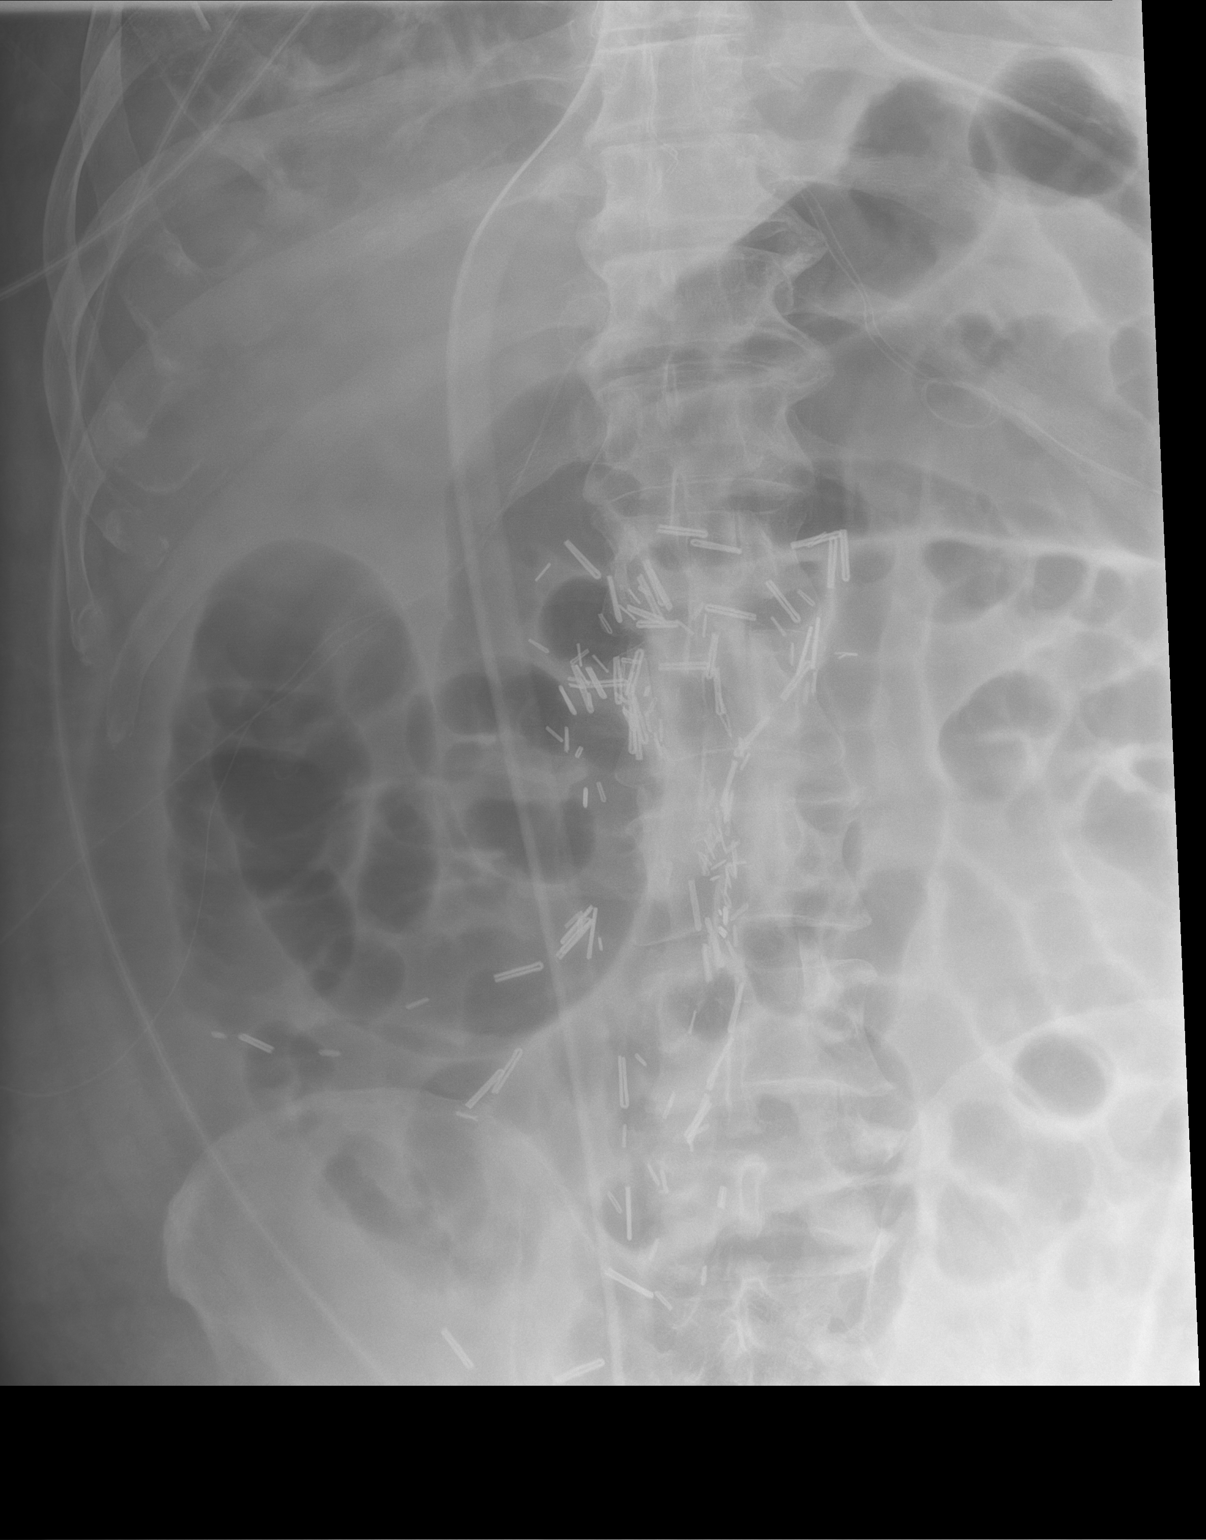

[3 of 3 positions shown; findings below may reference images not displayed]

FINDINGS: Loops of mildly dilated bowel throughout the abdomen persist without
appreciable air-fluid levels. No free air evident on supine imaging.
Multiple surgical clips noted throughout the abdomen and upper
pelvis.
IMPRESSION: Suspect postoperative ileus, although a degree of partial bowel
obstruction cannot be excluded in this circumstance. No free air
appreciable on supine examination. Multiple surgical clips present.

## 2020-05-04 IMAGING — DX DG CHEST 1V
1 series · 1 of 1 positions shown · non-contrast
Comparison: Portable exam [BC] hours compared to [DATE]

CLINICAL DATA: Post MVR, chest tube

EXAM:
CHEST  1 VIEW

[chest ap]
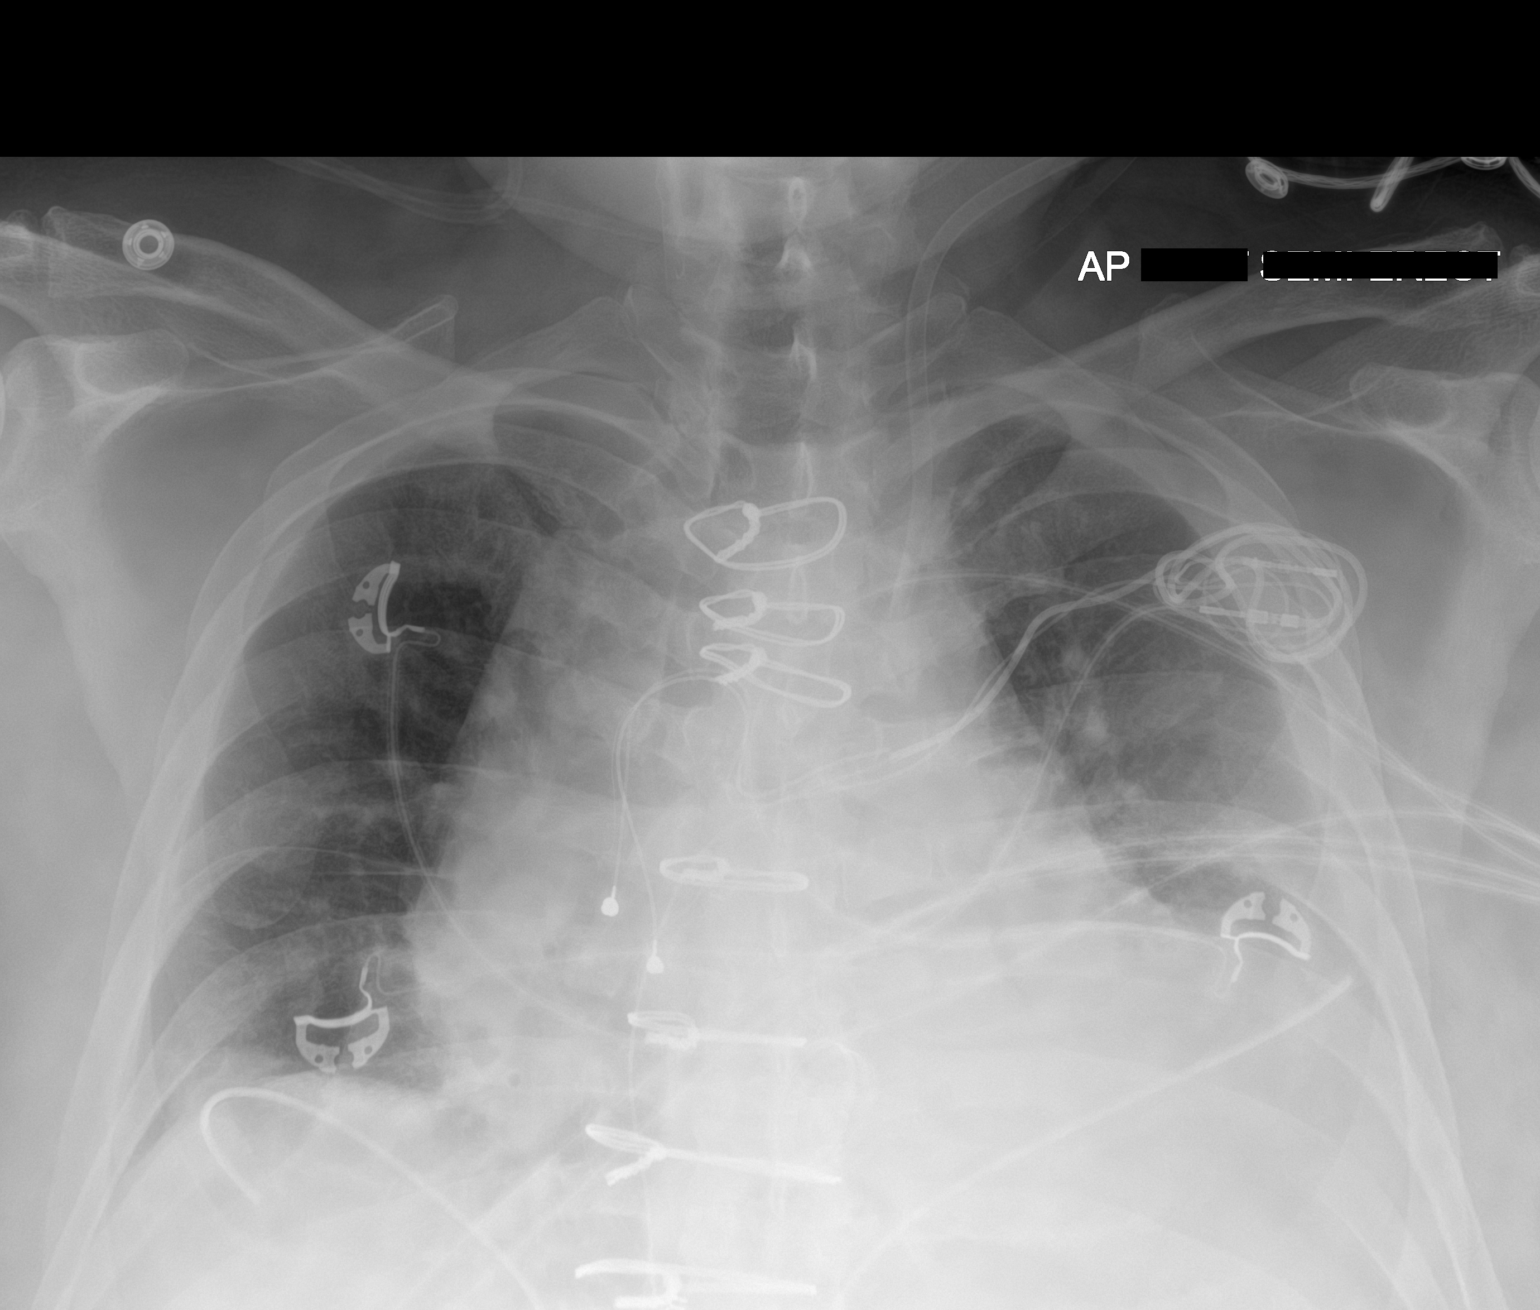

[1 of 1 positions shown; findings below may reference images not displayed]

FINDINGS: LEFT jugular central venous catheter unchanged.

BILATERAL thoracostomy tubes unchanged.

Enlargement of cardiac silhouette post median sternotomy and MVR.

Pacing leads project over chest.

Bibasilar atelectasis greater on LEFT with small LEFT pleural
effusion.

Upper lungs clear.

No pneumothorax.
IMPRESSION: Bibasilar atelectasis LEFT greater than RIGHT.

Small LEFT pleural effusion.

## 2020-05-04 MED ORDER — LIDOCAINE 5 % EX PTCH
1.0000 | MEDICATED_PATCH | Freq: Every day | CUTANEOUS | Status: DC
  Administered 2020-05-04 – 2020-05-07 (×3): 1 via TRANSDERMAL
  Filled 2020-05-04 (×6): qty 1

## 2020-05-04 MED ORDER — FUROSEMIDE 10 MG/ML IJ SOLN
80.0000 mg | Freq: Once | INTRAMUSCULAR | Status: AC
  Administered 2020-05-04: 80 mg via INTRAVENOUS
  Filled 2020-05-04: qty 8

## 2020-05-04 MED ORDER — ACETAMINOPHEN 500 MG PO TABS
500.0000 mg | ORAL_TABLET | Freq: Three times a day (TID) | ORAL | Status: DC
  Administered 2020-05-04 – 2020-05-05 (×5): 500 mg via ORAL
  Filled 2020-05-04 (×5): qty 1

## 2020-05-04 NOTE — Consult Note (Signed)
Reason for Consult: AKI Referring Physician: Orvan Seen, MD  Jonathan Shaffer is an 54 y.o. 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  His surgery was complicated by acute respiratory failure felt to be due to acute pulmonary edema, atelectasis and bilateral pleural effusions.  He has also been on Nafcillin for MSSA SBE and CNS infection.  Since his surgery he has also developed a possible drug rash on his torso and a rising BUN/Cr.  We were consulted to further evaluate his AKI.  The trend in Scr is seen below.   Of note, he was started on pressors on 04/23/20 and was given IV lasix 80 mg on 11/30 as well as today.   He has had good UOP ranging from 1 liter to 4.6 liters per day.  He has also been on multiple antibiotics during his hospitalization and is now on Nafcillin 12 grams IV per day.  Trend in Creatinine: Creatinine, Ser  Date/Time Value Ref Range Status  05/04/2020 03:31 AM 3.07 (H) 0.61 - 1.24 mg/dL Final  05/03/2020 04:24 AM 2.92 (H) 0.61 - 1.24 mg/dL Final  05/02/2020 04:07 AM 2.46 (H) 0.61 - 1.24 mg/dL Final  05/01/2020 05:41 PM 2.02 (H) 0.61 - 1.24 mg/dL Final  05/01/2020 03:50 AM 1.79 (H) 0.61 - 1.24 mg/dL Final  04/13/2020 10:55 PM 1.67 (H) 0.61 - 1.24 mg/dL Final  04/28/2020 01:28 PM 1.00 0.61 - 1.24 mg/dL Final  04/14/2020 11:57 AM 0.90 0.61 - 1.24 mg/dL Final  04/25/2020 10:57 AM 0.90 0.61 - 1.24 mg/dL Final  05/02/2020 10:13 AM 1.00 0.61 - 1.24 mg/dL Final  04/20/2020 09:18 AM 0.90 0.61 - 1.24 mg/dL Final  04/22/2020 11:22 PM 1.20 0.61 - 1.24 mg/dL Final  04/23/2020 03:33 AM 1.25 (H) 0.61 - 1.24 mg/dL Final  04/28/2020 12:14 AM 1.38 (H) 0.61 - 1.24 mg/dL Final  04/14/2020 02:28 AM 1.13 0.61 - 1.24 mg/dL Final  04/26/2020 09:58 AM 1.25 (H) 0.61 - 1.24 mg/dL Final  04/27/2020 05:00 AM 1.41 (H) 0.61 - 1.24  mg/dL Final  04/24/2020 05:43 PM 1.40 (H) 0.61 - 1.24 mg/dL Final  04/24/2020 06:00 AM 1.44 (H) 0.61 - 1.24 mg/dL Final  04/23/2020 06:00 PM 1.38 (H) 0.61 - 1.24 mg/dL Final  04/23/2020 05:37 AM 1.34 (H) 0.61 - 1.24 mg/dL Final  04/23/2020 01:30 AM 1.41 (H) 0.61 - 1.24 mg/dL Final  04/18/2020 10:00 PM 1.33 (H) 0.61 - 1.24 mg/dL Final  04/26/2020 05:17 PM 1.41 (H) 0.61 - 1.24 mg/dL Final  04/21/2020 01:53 PM 1.48 (H) 0.61 - 1.24 mg/dL Final  05/02/2020 10:17 AM 1.29 (H) 0.61 - 1.24 mg/dL Final  04/09/2020 04:05 AM 1.51 (H) 0.61 - 1.24 mg/dL Final  04/12/2020 02:02 AM 1.59 (H) 0.61 - 1.24 mg/dL Final    PMH:   Past Medical History:  Diagnosis Date  . Cancer ([place])   . Diabetes mellitus without complication ([place])   . High cholesterol   . Testicle cancer ([place])   . Thyroid disease   . Urticaria    Recurrent idiopathic urticaria in 2018    PSH:   Past Surgical History:  Procedure Laterality Date  . MINOR IRRIGATION AND DEBRIDEMENT OF WOUND Right 04/13/2020   Procedure: MINOR IRRIGATION AND DEBRIDEMENT OF WOUND;  Surgeon: Leandrew Koyanagi, MD;  Location: [facility];  Service: Orthopedics;  Laterality: Right;  . MITRAL VALVE REPLACEMENT N/A  04/19/2020   Procedure: MITRAL VALVE (MV) REPLACEMENT USING ON-X MITRAL VALVE SIZE 31/33 MM,  PERMANENT EPICARDIAL PACEMAKING SYSTEM IMPLANT USING MEDTRONIC LEADS;  Surgeon: Wonda Olds, MD;  Location: [facility];  Service: Open Heart Surgery;  Laterality: N/A;  . ORCHIECTOMY    . RIGHT/LEFT HEART CATH AND CORONARY ANGIOGRAPHY N/A 04/16/2020   Procedure: RIGHT/LEFT HEART CATH AND CORONARY ANGIOGRAPHY;  Surgeon: Jettie Booze, MD;  Location: [facility];  Service: Cardiovascular;  Laterality: N/A;  . TEE WITHOUT CARDIOVERSION N/A 04/19/2020   Procedure: TRANSESOPHAGEAL ECHOCARDIOGRAM (TEE);  Surgeon: Dorothy Spark, MD;  Location: [facility];  Service: Cardiovascular;  Laterality: N/A;  . TEE WITHOUT CARDIOVERSION N/A 04/26/2020    Procedure: TRANSESOPHAGEAL ECHOCARDIOGRAM (TEE);  Surgeon: Wonda Olds, MD;  Location: [facility];  Service: Open Heart Surgery;  Laterality: N/A;    Allergies: No Known Allergies  Medications:   Prior to Admission medications   Medication Sig Start Date End Date Taking? Authorizing Provider  HYDROcodone-acetaminophen (NORCO/VICODIN) 5-325 MG tablet Take 1 tablet by mouth every 6 (six) hours as needed for moderate pain.   Yes [provider]  lidocaine (LIDODERM) 5 % Place 1 patch onto the skin daily. Remove & Discard patch within 12 hours or as directed by MD 04/17/20  Yes Tacy Learn, PA-C  meloxicam (MOBIC) 7.5 MG tablet Take 7.5 mg by mouth daily.   Yes [provider]  predniSONE (DELTASONE) 10 MG tablet Take as directed for 12 days.  Daily dose 6,6,5,5,4,4,3,3,2,2,1,1. Patient taking differently: Take 10-60 mg by mouth See admin instructions. Take as directed for 12 days.  Daily dose 6,6,5,5,4,4,3,3,2,2,1,1. 04/21/20  Yes Hilts, Legrand Como, MD  traMADol (ULTRAM) 50 MG tablet Take 1-2 tablets (50-100 mg total) by mouth every 6 (six) hours as needed for moderate pain. 04/19/20  Yes Hilts, Michael, MD    Inpatient medications: . sodium chloride   Intravenous Once  . sodium chloride   Intravenous Once  . acetaminophen  500 mg Oral Q8H  . aspirin EC  81 mg Oral Daily  . atorvastatin  40 mg Oral Daily  . bisacodyl  10 mg Oral Daily   Or  . bisacodyl  10 mg Rectal Daily  . Chlorhexidine Gluconate Cloth  6 each Topical Daily  . colchicine  0.6 mg Oral BID  . docusate sodium  200 mg Oral Daily  . feeding supplement  237 mL Oral BID BM  . insulin aspart  2-6 Units Subcutaneous Q4H  . insulin detemir  8 Units Subcutaneous Q12H  . lactulose  30 g Oral TID  . lidocaine  1 patch Transdermal Daily  . loratadine  10 mg Oral Daily  . mouth rinse  15 mL Mouth Rinse BID  . multivitamin with minerals  1 tablet Oral Daily  . mupirocin ointment  1 application Nasal BID  .  pantoprazole  40 mg Oral Daily  . sodium chloride flush  10-40 mL Intracatheter Q12H  . sodium chloride flush  3 mL Intravenous Q12H  . Warfarin - Pharmacist Dosing Inpatient   Does not apply q1600    Discontinued Meds:   Medications Discontinued During This Encounter  Medication Reason  . vancomycin (VANCOCIN) IVPB 1000 mg/200 mL premix Dose change  . HYDROcodone-acetaminophen (NORCO/VICODIN) 5-325 MG tablet   . methocarbamol (ROBAXIN) 500 MG tablet   . meloxicam (MOBIC) 7.5 MG tablet   . HYDROcodone-acetaminophen (NORCO/VICODIN) 5-325 MG tablet   . lactated ringers infusion   . insulin regular,  human (MYXREDLIN) 100 units/ 100 mL infusion   . lactated ringers infusion   . dextrose 5 % in lactated ringers infusion   . dextrose 50 % solution 0-50 mL   . vancomycin (VANCOREADY) IVPB 750 mg/150 mL   . sodium chloride 0.9 % bolus 500 mL   . insulin regular, human (MYXREDLIN) 100 units/ 100 mL infusion   . ceFEPIme (MAXIPIME) 2 g in sodium chloride 0.9 % 100 mL IVPB   . vancomycin (VANCOCIN) IVPB 1000 mg/200 mL premix   . doxycycline (VIBRAMYCIN) 100 mg in sodium chloride 0.9 % 250 mL IVPB   . nafcillin injection 2 g Entry Error  . enoxaparin (LOVENOX) injection 40 mg   . dextrose 5 % in lactated ringers infusion   . 0.9 %  sodium chloride infusion   . hydrocortisone sodium succinate (SOLU-CORTEF) 100 MG injection 100 mg   . lactated ringers infusion   . insulin glargine (LANTUS) injection 10 Units   . insulin aspart (novoLOG) injection 0-9 Units   . insulin aspart (novoLOG) injection 0-5 Units   . insulin aspart (novoLOG) injection 2-6 Units   . enoxaparin (LOVENOX) injection 60 mg   . insulin glargine (LANTUS) injection 20 Units   . lactated ringers infusion Patient Transfer  . fentaNYL (SUBLIMAZE) injection 25-50 mcg Patient Transfer  . ondansetron (ZOFRAN) injection 4 mg Patient Transfer  . insulin aspart (novoLOG) injection 4 Units   . insulin glargine (LANTUS) injection 26  Units   . sodium chloride flush (NS) 0.9 % injection 3 mL Patient Transfer  . sodium chloride flush (NS) 0.9 % injection 3 mL Patient Transfer  . 0.9 %  sodium chloride infusion Patient Transfer  . 0.9 %  sodium chloride infusion Patient Transfer  . Heparin (Porcine) in NaCl 1000-0.9 UT/500ML-% SOLN Patient Transfer  . midazolam (VERSED) injection Patient Transfer  . fentaNYL (SUBLIMAZE) injection Patient Transfer  . lidocaine (PF) (XYLOCAINE) 1 % injection Patient Transfer  . Radial Cocktail/Verapamil only Patient Transfer  . heparin sodium (porcine) injection Patient Transfer  . acetaminophen (TYLENOL) tablet 263 mg Duplicate  . ondansetron (ZOFRAN) injection 4 mg Duplicate  . norepinephrine (LEVOPHED) 33m in 2589mpremix infusion   . nafcillin 2 g in sodium chloride 0.9 % 100 mL IVPB   . vancomycin (VANCOREADY) IVPB 1250 mg/250 mL   . vasopressin (PITRESSIN) 10 Units in dextrose 5 % 20 mL (0.5 Units/mL) pediatric infusion   . Chlorhexidine Gluconate Cloth 2 % PADS 6 each Patient Transfer  . MEDLINE mouth rinse Patient Transfer  . dextrose 50 % solution 0-50 mL Patient Transfer  . acetaminophen (TYLENOL) tablet 650 mg Patient Transfer  . acetaminophen (TYLENOL) suppository 650 mg Patient Transfer  . ondansetron (ZOFRAN) tablet 4 mg Patient Transfer  . ondansetron (ZOFRAN) injection 4 mg Patient Transfer  . sodium chloride flush (NS) 0.9 % injection 10-40 mL Patient Transfer  . sodium chloride flush (NS) 0.9 % injection 10-40 mL Patient Transfer  . fentaNYL (SUBLIMAZE) injection 50 mcg Patient Transfer  . insulin aspart (novoLOG) injection 2-6 Units Patient Transfer  . enoxaparin (LOVENOX) injection 60 mg Patient Transfer  . insulin aspart (novoLOG) injection 7 Units Patient Transfer  . insulin glargine (LANTUS) injection 30 Units Patient Transfer  . lactated ringers infusion Patient Transfer  . sodium chloride flush (NS) 0.9 % injection 3 mL Patient Transfer  . sodium chloride  flush (NS) 0.9 % injection 3 mL Patient Transfer  . 0.9 %  sodium chloride infusion Patient Transfer  .  carvedilol (COREG) tablet 3.125 mg Patient Transfer  . metoprolol tartrate (LOPRESSOR) tablet 12.5 mg   . Chlorhexidine Gluconate Cloth 2 % PADS 6 each Patient Transfer  . EPINEPHrine (ADRENALIN) 4 mg in NS 250 mL (0.016 mg/mL) premix infusion   . milrinone (PRIMACOR) 20 MG/100 ML (0.2 mg/mL) infusion   . nitroGLYCERIN 50 mg in dextrose 5 % 250 mL (0.2 mg/mL) infusion   . magnesium sulfate (IV Push/IM) injection 40 mEq   . potassium chloride injection 80 mEq   . heparin 30,000 units/NS 1000 mL solution for CELLSAVER   . heparin sodium (porcine) 2,500 Units, papaverine 30 mg in electrolyte-148 (PLASMALYTE-148) 500 mL irrigation   . tranexamic acid (CYKLOKAPRON) pump prime solution 268 mg   . cefUROXime (ZINACEF) 750 mg in sodium chloride 0.9 % 100 mL IVPB   . hemostatic agents   . 0.9 %  sodium chloride infusion (Manually program via Guardrails IV Fluids) Patient Transfer  . 0.9 %  sodium chloride infusion (Manually program via Guardrails IV Fluids) Patient Transfer  . 0.9 % irrigation (POUR BTL)   . vancomycin (VANCOCIN) powder   . tranexamic acid (CYKLOKAPRON) 2,500 mg in sodium chloride 0.9 % 250 mL (10 mg/mL) infusion   . thrombin 5,000 Units, calcium chloride 5 mL   . Platelet Poor Plasma Optime   . Platelet Rich Plasma Optime   . hemostatic agents Patient Transfer  . aspirin EC tablet 325 mg   . aspirin chewable tablet 324 mg   . chlorhexidine (PERIDEX) 0.12 % solution 15 mL   . midazolam (VERSED) injection 2 mg   . ALPRAZolam (XANAX) tablet 0.5 mg   . dexmedetomidine (PRECEDEX) 400 MCG/100ML (4 mcg/mL) infusion   . electrolyte-A (PLASMALYTE-A PH 7.4) infusion   . EPINEPHrine (ADRENALIN) 4 mg in NS 250 mL (0.016 mg/mL) premix infusion   . phenylephrine (NEOSYNEPHRINE) 20-0.9 MG/250ML-% infusion   . norepinephrine (LEVOPHED) 61m in 2566mpremix infusion   . nitroGLYCERIN 50 mg  in dextrose 5 % 250 mL (0.2 mg/mL) infusion   . morphine 2 MG/ML injection 1-4 mg   . vasopressin (PITRESSIN) 20 Units in sodium chloride 0.9 % 100 mL infusion-*FOR SHOCK*   . metoprolol tartrate (LOPRESSOR) tablet 12.5 mg   . metoprolol tartrate (LOPRESSOR) 25 mg/10 mL oral suspension 12.5 mg   . acetaminophen (TYLENOL) tablet 1,000 mg   . acetaminophen (TYLENOL) 160 MG/5ML solution 1,000 mg   . colchicine tablet 0.3 mg   . insulin regular, human (MYXREDLIN) 100 units/ 100 mL infusion   . electrolyte-A (PLASMALYTE-A PH 7.4) infusion   . warfarin (COUMADIN) tablet 5 mg     Social History:  reports that he has never smoked. He has never used smokeless tobacco. He reports current alcohol use. He reports that he does not use drugs.  Family History:   Family History  Problem Relation Age of Onset  . Diabetes Father   . Hypertension Father   . Heart disease Father   . Hyperlipidemia Father     A comprehensive review of systems was negative except for: Constitutional: positive for fatigue and malaise Integument/breast: positive for rash Musculoskeletal: positive for arthralgias Neurological: positive for weakness and slowed mentation Weight change: -0.071 kg  Intake/Output Summary (Last 24 hours) at 05/04/2020 1302 Last data filed at 05/04/2020 1200 Gross per 24 hour  Intake 831.96 ml  Output 5660 ml  Net -4828.04 ml   BP 134/90   Pulse 92   Temp 98.3 F (36.8 C) (Oral)  Resp (!) 29   Ht _0  (1.905 m)   Wt (!) 140 kg   SpO2 95%   BMI 38.58 kg/m  Vitals:   05/04/20 1000 05/04/20 1100 05/04/20 1114 05/04/20 1200  BP: (!) 124/93 (!) 139/97  134/90  Pulse: 87 88  92  Resp: (!) 26 (!) 27  (!) 29  Temp:   98.3 F (36.8 C)   TempSrc:   Oral   SpO2: 96% 93%  95%  Weight:      Height:         General appearance: fatigued, no distress and slowed mentation Head: Normocephalic, without obvious abnormality, atraumatic Resp: diminished breath sounds bibasilar Chest wall:  vertical sternal incision, + tenderness, diffuse erytyhematour maculopapular rash on torso Cardio: regular rate and rhythm and metallic click GI: soft, non-tender; bowel sounds normal; no masses,  no organomegaly Extremities: edema 1+ bilateral lower extremities  Labs: Basic Metabolic Panel: Recent Labs  Lab 04/28/20 0014 04/16/2020 0333 04/28/2020 2322 04/18/2020 0918 05/03/2020 1328 04/04/2020 1331 04/11/2020 1823 04/13/2020 2255 05/01/20 0350 05/01/20 1741 05/02/20 0407 05/03/20 0424 05/04/20 0331  NA 134*   < > 135   < > 138   < > 138 138 137 140 135 135 136  K 3.9   < > 3.5   < > 5.0   < > 5.0 5.0 4.9 4.1 4.5 4.4 3.8  CL 104   < > 103   < > 104  --   --  104 102 106 101 98 100  CO2 19*   < > 22  --   --   --   --  20* 21* 21* _1 GLUCOSE 188*   < > 88   < > 128*  --   --  134* 137* 111* 128* 144* 143*  BUN 14   < > 9   < > 10  --   --  _2 25* 32* 34*  CREATININE 1.38*   < > 1.20   < > 1.00  --   --  1.67* 1.79* 2.02* 2.46* 2.92* 3.07*  ALBUMIN 1.3*  --  1.3*  --   --   --   --   --   --  1.8*  --  1.4* 1.7*  CALCIUM 7.3*   < > 7.5*  --   --   --   --  7.3* 7.1* 6.8* 7.3* 6.9* 6.9*   < > = values in this interval not displayed.   Liver Function Tests: Recent Labs  Lab 05/01/20 1741 05/03/20 0424 05/04/20 0331  AST 3,257* 1,021* 480*  ALT 984* 686* 517*  ALKPHOS 123 372* 348*  BILITOT 2.6* 3.7* 3.1*  PROT 5.4* 5.6* 5.9*  ALBUMIN 1.8* 1.4* 1.7*   No results for input(s): LIPASE, AMYLASE in the last 168 hours. Recent Labs  Lab 05/03/20 0824  AMMONIA 43*   CBC: Recent Labs  Lab 04/20/2020 0258 04/14/2020 0918 05/01/20 1431 05/02/20 0407 05/03/20 0424 05/04/20 0331  WBC 19.2*   < > 19.9* 26.2* 30.3* 26.2*  NEUTROABS 17.5*  --   --  22.9*  --   --   HGB 10.3*   < > 8.9* 9.7* 10.1* 9.4*  HCT 33.0*   < > 27.0* 29.1* 31.1* 28.9*  MCV 94.6   < > 90.9 91.5 92.6 92.3  PLT 640*   < > 242 279 311 286   < > = values in this interval not displayed.  PT/INR: _0 (inr:5) Cardiac Enzymes: )No results for input(s): CKTOTAL, CKMB, CKMBINDEX, TROPONINI in the last 168 hours. CBG: Recent Labs  Lab 05/03/20 1945 05/03/20 2349 05/04/20 0342 05/04/20 0621 05/04/20 1112  GLUCAP 181* 148* 109* 115* 128*    Iron Studies: No results for input(s): IRON, TIBC, TRANSFERRIN, FERRITIN in the last 168 hours.  Xrays/Other Studies: DG Chest 1 View  Result Date: 05/04/2020 CLINICAL DATA:  Post MVR, chest tube EXAM: CHEST  1 VIEW COMPARISON:  Portable exam 0553 hours compared to 05/03/2020 FINDINGS: LEFT jugular central venous catheter unchanged. BILATERAL thoracostomy tubes unchanged. Enlargement of cardiac silhouette post median sternotomy and MVR. Pacing leads project over chest. Bibasilar atelectasis greater on LEFT with small LEFT pleural effusion. Upper lungs clear. No pneumothorax. IMPRESSION: Bibasilar atelectasis LEFT greater than RIGHT. Small LEFT pleural effusion. Electronically Signed   By: Lavonia Dana M.D.   On: 05/04/2020 08:18   DG Chest 1 View  Result Date: 05/03/2020 CLINICAL DATA:  Chest tube. Open-heart surgery. Shortness of breath. EXAM: CHEST  1 VIEW COMPARISON:  05/02/2020. FINDINGS: Left IJ sheath in stable position. Bilateral chest tubes in stable position. Cardiomegaly with bilateral mild pulmonary interstitial prominence suggesting pulmonary interstitial edema, new from prior exam. Persistent bibasilar atelectasis. No pleural effusion or pneumothorax. IMPRESSION: 1. Left IJ sheath in stable position. Bilateral chest tubes in stable position. No pneumothorax. 2. Cardiomegaly with new bilateral mild interstitial prominence suggesting pulmonary interstitial edema. Persistent bibasilar atelectasis. Electronically Signed   By: Marcello Moores  Register   On: 05/03/2020 06:25   DG Abd 1 View  Result Date: 05/04/2020 CLINICAL DATA:  Occluded EXAM: ABDOMEN - 1 VIEW COMPARISON:  CT abdomen and pelvis April 22, 2020 FINDINGS: Loops of  mildly dilated bowel throughout the abdomen persist without appreciable air-fluid levels. No free air evident on supine imaging. Multiple surgical clips noted throughout the abdomen and upper pelvis. IMPRESSION: Suspect postoperative ileus, although a degree of partial bowel obstruction cannot be excluded in this circumstance. No free air appreciable on supine examination. Multiple surgical clips present. Electronically Signed   By: Lowella Grip III M.D.   On: 05/04/2020 08:52   [place] LT LOWER EXTREM LTD SOFT TISSUE NON VASCULAR  Result Date: 05/03/2020 CLINICAL DATA:  Follow-up left great toe lesion EXAM: ULTRASOUND  LEFT FIRST TOE TECHNIQUE: Ultrasound examination of the lower extremity soft tissues was performed in the area of clinical concern. COMPARISON:  Ultrasound April 24, 2020. FINDINGS: Decreased size of the complex fluid collection along the medial aspect of left great toe which now measures 2.2 x 2.2 x 1.3 cm previously 2.4 x 2.3 x 1.5 cm. IMPRESSION: Interval decrease in size of the phlegmonous collection/developing abscess along the medial aspect of the left great toe. Electronically Signed   By: Dahlia Bailiff MD   On: 05/03/2020 12:47   VAS US RENAL ARTERY DUPLEX  Result Date: 05/03/2020 ABDOMINAL VISCERAL Indications: Endocarditis, acute kidney injury concern for embolization to renal              artery. High Risk Factors: Hypertension, hyperlipidemia, Diabetes, coronary artery                    disease. Other Factors: Status post MVR on 04/16/2020. Limitations: Air/bowel gas and obesity. Performing Technologist: Oda Cogan RDMS, RVT  Examination Guidelines: A complete evaluation includes B-mode imaging, spectral Doppler, color Doppler, and power Doppler as needed of all accessible portions of each vessel. Bilateral testing is considered an integral part of a complete  examination. Limited examinations for reoccurring indications may be performed as noted.  Duplex Findings:  +--------------------+--------+--------+------+--------------+ Mesenteric          PSV cm/sEDV cm/sPlaque   Comments    +--------------------+--------+--------+------+--------------+ Aorta Prox                                not visualized +--------------------+--------+--------+------+--------------+ Celiac Artery Origin                      not visualized +--------------------+--------+--------+------+--------------+ SMA Proximal                              not visualized +--------------------+--------+--------+------+--------------+   +------------------+--------+--------+--------+ Right Renal ArteryPSV cm/sEDV cm/sComment  +------------------+--------+--------+--------+ Origin                            not seen +------------------+--------+--------+--------+ Proximal             70      17            +------------------+--------+--------+--------+ Mid                  46      19            +------------------+--------+--------+--------+ Distal               66      19            +------------------+--------+--------+--------+ +-----------------+--------+--------+--------+ Left Renal ArteryPSV cm/sEDV cm/sComment  +-----------------+--------+--------+--------+ Origin                           not seen +-----------------+--------+--------+--------+ Proximal                         not seen +-----------------+--------+--------+--------+ Mid                 70      25            +-----------------+--------+--------+--------+ Distal              54      17            +-----------------+--------+--------+--------+  Technologist observations: Technically difficult study due to patient's body habitus and excessive air and gas. +------------+--------+--------+----+-----------+--------+--------+----+ Right KidneyPSV cm/sEDV cm/sRI  Left KidneyPSV cm/sEDV cm/sRI   +------------+--------+--------+----+-----------+--------+--------+----+  Upper Pole                      Upper Pole                      +------------+--------+--------+----+-----------+--------+--------+----+ Mid         36      11      0.69Mid        38      11      0.72 +------------+--------+--------+----+-----------+--------+--------+----+ Lower Pole  23      8       0.65Lower Pole 25      9       0.64 +------------+--------+--------+----+-----------+--------+--------+----+ Hilar       17      6       0.63Hilar                           +------------+--------+--------+----+-----------+--------+--------+----+ +------------------+-----+------------------+-----+  Right Kidney           Left Kidney             +------------------+-----+------------------+-----+ RAR                    RAR                     +------------------+-----+------------------+-----+ RAR (manual)           RAR (manual)            +------------------+-----+------------------+-----+ Cortex                 Cortex                  +------------------+-----+------------------+-----+ Cortex thickness       Corex thickness         +------------------+-----+------------------+-----+ Kidney length (cm)11.25Kidney length (cm)13.00 +------------------+-----+------------------+-----+  Summary: Renal:  Right: Patent renal artery. Left:  Patent renal artery.  *See table(s) above for measurements and observations.  Diagnosing physician: Deitra Mayo MD  Electronically signed by Deitra Mayo MD on 05/03/2020 at 7:56:09 PM.    Final      Assessment/Plan: 1.  AKI, non-oliguric- Unclear etiology, however he has had multiple renal insults with hypotension following surgery requiring pressors, multiple antibiotics, IV contrast, and diuretics with marked diuresis since his surgery. UA with moderate blood, trace leukocytes, negative protein.  DDx includes ischemic ATN from hypotension/pressors, AIN from Nafcillin, immune mediated GN due to  endocarditis, septic emboli to kidneys, or pre-renal azotemia due to diuresis and 3rd spacing.   1. Agree with urine eosinophils and will recheck UA and FeNa.   2. Will order serologies to r/o acute GN 3. Renal artery duplex without stenosis 4. Consider imaging kidneys with MRI to evaluate for possible septic emboli involvement (although not seen on CT of abd with contrast dated 04/17/2020). 5. Hold diuretics for now and follow UOP and daily Scr.   6. Renal dose Nafcillin and other meds as his eGFR is <20 with rising BUN/Cr.  Avoid nephrotoxic agents (NSAIDs/IV contrast, Fleets enema). 2. 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  ID following and will require 6 weeks of IV abx. 3. S/p MVR 4. Sinus node dysfunction- has known atrial rhythm and may require PPM 5. DM type 2 6. Anemia of critical illness/ABLA following surgery- transfuse as needed 7. Acute CHF following sinus arrest 8. Rash- on torso, possible drug rash from Nafcillin.  Unfortunately with CNS involvement he will need to stay on this for now.    Jonathan Shaffer 05/04/2020, 1:02 PM

## 2020-05-04 NOTE — Progress Notes (Signed)
NAME:  Jonathan Shaffer, MRN:  [id number], DOB:  02/12/1966, LOS: 12 ADMISSION DATE:  04/28/2020, CONSULTATION DATE:  04/23/20 REFERRING MD:  Karleen Hampshire, CHIEF COMPLAINT:  Septic shock  Brief History   MSSA bacteremia +/- AC septic arthritis, septic emboli to spleen  History of present illness   Jonathan Shaffer is a 54 y/o gentleman admitted with sepsis, found to have MSSA bacteremia. He developed sudden onset nausea and vomiting and chills last week and later developed fevers and R shoulder pain. He was evaluated and felt to have a sprain. He was treated with an OP steroid injection and started oral steroids. He developed a painful rash on his hand. His progressive malaise, fevers, rash, and joint pain prompted return to the ED. At presentation he was febrile, tachycardic, tachypneic, with lactic acidosis. He was in DKA at presentation. He has a previous history of "high diabetes markers", which he reports he was not following up on. He was started on empiric vanc, zosyn & cefepime, and doxycycline. He has been evaluated by orthopaedics for possible septic arthritis of his R AC joint. ID has been consulted for MSSA+ blood cultures. No known history of IVDU or injuries prior to becoming ill. He has been encephalopathic this admission, prompting concern for septic emboli to the brain; MRI pending.  Past Medical History  Uncontrolled DM Testicular cancer; in remission [facility] Events   Started vasopressors 11/20 MV replacement 11/27  Consults:  ID Ortho PCCM TCTS Nephrology  Procedures:  PICC  Mechanical MV replacement 11/27  Significant Diagnostic Tests:  MRI R shoulder> septic arthritis AC joint, possible intramuscular abscess, OM distal clavicle Echocardiogram 11/19> LVEF 55 to 60%, normal valves. LUE US> no DVT MRI left wrist> cellulitis, no septic arthritis  Micro Data:  Blood culture 11/19> MSSA  2/3 Urine culture 11/19> staph aureus Blood culture 11/21> NG Shoulder  culture 11/22> abundant MSSA Blood cultuers 11/23> NG Operative cultures 11/22> abundant WBC> rare MSSA  Antimicrobials:  Vanc 11/20 Zosyn 11/19 Cefepime 11/19- 11/20 Doxycycline 11/19 Nafcillin 11/20>  Interim history/subjective:  Complains of back pain throughout his back- mostly on both flanks.  Foot blisters debrided bilaterally- consistent with ischemic and hemorrahagic blisters. This morning pacing above pacemaker with HR in 80s. Off oxygen.  Objective   Blood pressure 139/79, pulse 90, temperature 98.2 F (36.8 C), temperature source Oral, resp. rate (!) 24, height 6\' 3"  (1.905 m), weight (!) 140 kg, SpO2 99 %.        Intake/Output Summary (Last 24 hours) at 05/04/2020 0920 Last data filed at 05/04/2020 0600 Gross per 24 hour  Intake 1256.31 ml  Output 3725 ml  Net -2468.69 ml   Filed Weights   05/02/20 0600 05/03/20 0500 05/04/20 0600  Weight: (!) 139.2 kg (!) 140.1 kg (!) 140 kg    Examination: General: critically ill appearing man laying in bed in NAD HENT: [place]/AT, eyes anicteric Lungs: mild tachypnea, breathing comfortably on RA. Decreased basilar breath sounds. Cardiovascular:  RRR, mechanical click. Abdomen: obese, soft, NT Extremities: Foot wounds not examined. Compression socks in place. Pitting edema BLE. Derm: pallor. Erythematous rash on trunk persists.  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  -Appreciate ID's assistance.  -Will require 6 weeks of antibiotics per ID recommendations.  Eventually will add rifampin. -Appreciate cardiothoracic surgery's  management  --Appreciate Ortho's assistance-blisters unroofed today -Pharmacy consult for Coumadin -Dilaudid, oxycodone for pain control  Acute respiratory failure post-operatively due to acute  pulmonary edema, atelectasis, bilateral effusion-resolved -Chest tube management per primary -Further diuresis per nephrology's recommendations -out of bed mobility, incentive spirometer -Titrate down FiO2 as able to be maintain SPO2 greater than 90%  Drug rash -Claritin daily -Transitioned morphine to Dilaudid  AKI -worsening.  FeNa 0.8 consistent with prerenal etiology.  Likely diuresed through chest tube and intravascularly depleted. -Renally dose medications, avoid nephrotoxic meds -Nephrology consult today -Diuresis, albumin -Maintain MAP  >65 for adequate renal perfusion. -Strict I/Os -Checking for urine eosinophils to suggest AIN--pending -Renal artery ultrasound with normal perfusion  Sinus node dysfunction-known atrial rhythm -appreciate EP's assistance -Continue telemetry monitoring and pacing   DM with hyperglycemia- controlled; prev in DKA. A1c 11.3 -Insulin detemir 8 units twice daily plus siding scale insulin as needed -goal BG 140-180 while critically ill -needs OP DM education and improved control  Acute anemia; suspect this is mixed due to critical illness and operative blood loss- stable -con't to monitor -Transfuse hemoglobin less than 7 or hemodynamically significant bleeding  Hyperbilirubinemia-improving.  Likely due to hepatic congestion Elevated transaminases likely 2/2 sepsis and heart failure-likely due to critical illness; unclear trending up or down since recent surgery -Continue to monitor daily  Delirium- multifactorial due to sepsis, sleep disruption, ICU delirium -Out of bed mobility as able; PT consult -Adequate pain control.  Adding lidocaine patch. -Continue Seroquel for insomnia   Acute HFrEF Acute pulmonary edema Sinus arrest, pacemaker dependent -EP consult -appreciate their recommendations -con't diuresis -Due to Bradycardia no beta-blockers at this time -Due to AKI no ACE inhibitor, ARB, Entresto at this time  Poor PO intake,  moderate malnutrition with low prealbumin -RD consult- appreciate their recommendations  Back pain, most likely MSK related due to immobility -scheduled tylenol Q8h since LFTs improving -lidocaine patch to back  Foot ischemia from septic emboli -Appreciate respiratory surgery's assistance.  Will need outpatient follow-up for likely amputations.  Discussed patient care at bedside with nephrology, infectious disease, patient, and his wife.  Best practice:  Diet: NPO Pain/Anxiety/Delirium protocol (if indicated): fentanyl PRN VAP protocol (if indicated): n/a DVT prophylaxis: lovenox GI prophylaxis: n/a Glucose control: basal + SSI Mobility: progressive Code Status: full Family Communication: wife updated at bedside 12/1 Disposition: ICU  Labs   CBC: Recent Labs  Lab 04/27/2020 0258 05/01/2020 0918 05/01/20 0350 05/01/20 1431 05/02/20 0407 05/03/20 0424 05/04/20 0331  WBC 19.2*   < > 24.8* 19.9* 26.2* 30.3* 26.2*  NEUTROABS 17.5*  --   --   --  22.9*  --   --   HGB 10.3*   < > 7.5* 8.9* 9.7* 10.1* 9.4*  HCT 33.0*   < > 22.9* 27.0* 29.1* 31.1* 28.9*  MCV 94.6   < > 93.5 90.9 91.5 92.6 92.3  PLT 640*   < > 306 242 279 311 286   < > = values in this interval not displayed.    Basic Metabolic Panel: Recent Labs  Lab 04/04/2020 2255 04/20/2020 2255 05/01/20 0350 05/01/20 1431 05/01/20 1741 05/02/20 0407 05/03/20 0424 05/04/20 0331  NA 138   < > 137  --  140 135 135 136  K 5.0   < > 4.9  --  4.1 4.5 4.4 3.8  CL 104   < > 102  --  106 101 98 100  CO2 20*   < > 21*  --  21* 22 22 23   GLUCOSE 134*   < > 137*  --  111* 128* 144* 143*  BUN 14   < > 15  --  19 25* 32* 34*  CREATININE 1.67*   < > 1.79*  --  2.02* 2.46* 2.92* 3.07*  CALCIUM 7.3*   < > 7.1*  --  6.8* 7.3* 6.9* 6.9*  MG 2.9*  --  2.8* 2.6*  --  2.8*  --   --    < > = values in this interval not displayed.   GFR: Estimated Creatinine Clearance: 41.5 mL/min (A) (by C-G formula based on SCr of 3.07 mg/dL  (H)). Recent Labs  Lab 04/06/2020 2201 04/28/20 0014 04/18/2020 0333 05/01/20 1431 05/02/20 0407 05/03/20 0424 05/04/20 0331  WBC  --  16.0*   < > 19.9* 26.2* 30.3* 26.2*  LATICACIDVEN 1.5 1.4  --   --   --   --   --    < > = values in this interval not displayed.    Liver Function Tests: Recent Labs  Lab 04/28/20 0014 04/20/2020 2322 05/01/20 1741 05/03/20 0424 05/04/20 0331  AST 24 22 3,257* 1,021* 480*  ALT 20 14 984* 686* 517*  ALKPHOS 83 85 123 372* 348*  BILITOT 1.7* 1.4* 2.6* 3.7* 3.1*  PROT 5.5* 5.7* 5.4* 5.6* 5.9*  ALBUMIN 1.3* 1.3* 1.8* 1.4* 1.7*   No results for input(s): LIPASE, AMYLASE in the last 168 hours. Recent Labs  Lab 05/03/20 0824  AMMONIA 43*    ABG    Component Value Date/Time   PHART 7.419 04/22/2020 1823   PCO2ART 29.2 (L) 04/12/2020 1823   PO2ART 94 04/23/2020 1823   HCO3 18.8 (L) 04/13/2020 1823   TCO2 20 (L) 04/29/2020 1823   ACIDBASEDEF 5.0 (H) 04/07/2020 1823   O2SAT 97.0 04/16/2020 1823     Coagulation Profile: Recent Labs  Lab 04/19/2020 0258 05/02/2020 1541 05/03/20 0424 05/04/20 0331  INR 1.2 1.7* 2.0* 4.6*    Cardiac Enzymes: No results for input(s): CKTOTAL, CKMB, CKMBINDEX, TROPONINI in the last 168 hours.  HbA1C: Hgb A1c MFr Bld  Date/Time Value Ref Range Status  04/14/2020 02:58 AM 10.8 (H) 4.8 - 5.6 % Final    Comment:    (NOTE) Pre diabetes:          5.7%-6.4%  Diabetes:              >6.4%  Glycemic control for   <7.0% adults with diabetes   04/26/2020 10:17 AM 11.3 (H) 4.8 - 5.6 % Final    Comment:    (NOTE) Pre diabetes:          5.7%-6.4%  Diabetes:              >6.4%  Glycemic control for   <7.0% adults with diabetes     CBG: Recent Labs  Lab 05/03/20 1553 05/03/20 1945 05/03/20 2349 05/04/20 0342 05/04/20 0621  GLUCAP 151* 181* 148* 109* [place] Audrea Bolte, DO 05/04/20 9:20 AM [facility]

## 2020-05-04 NOTE — Progress Notes (Signed)
[facility] for Infectious Disease  Date of Admission:  04/26/2020      Total days of antibiotics 13  Nafcillin 11/20 >>             ASSESSMENT: Jonathan Shaffer is a 54 y.o. 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   He remains afebrile and WBC trending down today. Seems to be tolerating the Nafcillin without any progression of rash. No eosinophilia. Nephrology consulted today with AKI; SCr 3.07 but less of an interval bump. Hopeful he is plateauing. If concerns that the Nafcillin is contributing will need to re-hash a plan for antibiotics. Hold off on gentamycin synergy.   LFTs continue to come down, follow for timing to add Rifampin.   Dr. Sharol Given evaluated L foot further this morning - unroofed ischemic blisters reavealed no purulence but dusky appearing tissue with prolonged capillary refill noted. Likely will need partial amputation in the future.   EP following - he has improved intrinsic rhythm today on telemetry with temp epicardial pacing back up at 60 if needed. Watchful waiting for now. He has permanent epicardial leads in place if deemed necessary for generator (extravascular device).    PLAN: 1. Continue nafcillin  2. Follow nephrology recommendations re: AKI 3. Holding on PPM generator for now (epicardial system) 4. Follow progression of feet, fever and WBC curve 5. Hold on addition of gent / rifampin  6. Check LFTs in 2 days   Principal Problem:   Abscess of right shoulder Active Problems:   Severe sepsis (HCC)   Splenic infarct   Diabetic acidosis without coma (HCC)   AKI (acute kidney injury) ([place])   Endocarditis of mitral valve   Bacteremia   S/P MVR (mitral valve repair)   Embolic disease of toe (HCC)   Venous insufficiency (chronic) (peripheral)   History of open heart surgery   Foot lesion   . sodium chloride    Intravenous Once  . sodium chloride   Intravenous Once  . acetaminophen  500 mg Oral Q8H  . aspirin EC  81 mg Oral Daily  . atorvastatin  40 mg Oral Daily  . bisacodyl  10 mg Oral Daily   Or  . bisacodyl  10 mg Rectal Daily  . Chlorhexidine Gluconate Cloth  6 each Topical Daily  . colchicine  0.6 mg Oral BID  . docusate sodium  200 mg Oral Daily  . feeding supplement  237 mL Oral BID BM  . insulin aspart  2-6 Units Subcutaneous Q4H  . insulin detemir  8 Units Subcutaneous Q12H  . lactulose  30 g Oral TID  . lidocaine  1 patch Transdermal Daily  . loratadine  10 mg Oral Daily  . mouth rinse  15 mL Mouth Rinse BID  . multivitamin with minerals  1 tablet Oral Daily  . mupirocin ointment  1 application Nasal BID  . pantoprazole  40 mg Oral Daily  . sodium chloride flush  10-40 mL Intracatheter Q12H  . sodium chloride flush  3 mL Intravenous Q12H  . Warfarin - Pharmacist Dosing Inpatient   Does not apply q1600    SUBJECTIVE: Feels a little better today. More awake. No itching from his report. Rash has remained the same without any spreading or changes.  Still with a lot of chest pain anteriorly from coughing and sternotomy.     Review of Systems: Review  of Systems  Constitutional: Negative for chills, fever, malaise/fatigue and weight loss.  HENT: Negative for sore throat.   Respiratory: Negative for cough, sputum production and shortness of breath.   Cardiovascular: Positive for chest pain (present with coughing or movement).  Gastrointestinal: Negative for abdominal pain, diarrhea and vomiting.  Genitourinary: Negative for dysuria.  Musculoskeletal: Positive for joint pain (Foot pain L>R). Negative for myalgias and neck pain.  Skin: Positive for rash.  Neurological: Negative for headaches.  Psychiatric/Behavioral: Negative for depression and substance abuse. The patient is not nervous/anxious.     No Known Allergies  OBJECTIVE: Vitals:   05/04/20 0900 05/04/20 1000  05/04/20 1100 05/04/20 1114  BP: 131/86 (!) 124/93 (!) 139/97   Pulse: 79 87 88   Resp: (!) 28 (!) 26 (!) 27   Temp:    98.3 F (36.8 C)  TempSrc:    Oral  SpO2: 93% 96% 93%   Weight:      Height:       Body mass index is 38.58 kg/m.  Physical Exam Constitutional:      Appearance: He is well-developed.     Comments: Resting comfortably in bed.   HENT:     Mouth/Throat:     Dentition: Normal dentition. No dental abscesses.     Pharynx: No oropharyngeal exudate.  Eyes:     General: No scleral icterus. Cardiovascular:     Rate and Rhythm: Normal rate and regular rhythm.     Heart sounds: Normal heart sounds. No murmur heard.   Pulmonary:     Effort: Pulmonary effort is normal.     Breath sounds: Normal breath sounds.  Chest:     Comments: Midsternal incision dressed and clean.  Abdominal:     General: Bowel sounds are normal. There is no distension.     Palpations: Abdomen is soft.     Tenderness: There is no abdominal tenderness.  Musculoskeletal:     Comments: Feet wrapped in compression wraps - Dr. Jess Barters note reviewed   Lymphadenopathy:     Cervical: No cervical adenopathy.  Skin:    General: Skin is warm and dry.     Findings: No rash.     Comments: Blanchable pink/red rash noted on patients upper back and axilla extending down lateral abdomen. Not raised appearing.   Neurological:     Mental Status: He is alert and oriented to person, place, and time.  Psychiatric:        Judgment: Judgment normal.     Lab Results Lab Results  Component Value Date   WBC 26.2 (H) 05/04/2020   HGB 9.4 (L) 05/04/2020   HCT 28.9 (L) 05/04/2020   MCV 92.3 05/04/2020   PLT 286 05/04/2020    Lab Results  Component Value Date   CREATININE 3.07 (H) 05/04/2020   BUN 34 (H) 05/04/2020   NA 136 05/04/2020   K 3.8 05/04/2020   CL 100 05/04/2020   CO2 23 05/04/2020    Lab Results  Component Value Date   ALT 517 (H) 05/04/2020   AST 480 (H) 05/04/2020   ALKPHOS 348 (H)  05/04/2020   BILITOT 3.1 (H) 05/04/2020     Microbiology: Recent Results (from the past 240 hour(s))  Aerobic/Anaerobic Culture (surgical/deep wound)     Status: None   Collection Time: 04/29/2020  2:48 PM   Specimen: PATH Other; Tissue  Result Value Ref Range Status   Specimen Description   Final    ABSCESS SHOULDER RIGHT Performed at  [facility], [place] [address], [place], [place] [address]    Special Requests   Final    NONE Performed at [facility], [place] [address] [place], [place] [address]    Gram Stain   Final    FEW WBC PRESENT, PREDOMINANTLY PMN ABUNDANT GRAM POSITIVE COCCI    Culture   Final    MODERATE STAPHYLOCOCCUS AUREUS NO ANAEROBES ISOLATED Performed at [facility], [place] [address], [place], [place] [address]    Report Status 05/01/2020 FINAL  Final   Organism ID, Bacteria STAPHYLOCOCCUS AUREUS  Final      Susceptibility   Staphylococcus aureus - MIC*    CIPROFLOXACIN <=0.5 SENSITIVE Sensitive     ERYTHROMYCIN <=0.25 SENSITIVE Sensitive     GENTAMICIN <=0.5 SENSITIVE Sensitive     OXACILLIN 0.5 SENSITIVE Sensitive     TETRACYCLINE <=1 SENSITIVE Sensitive     VANCOMYCIN 1 SENSITIVE Sensitive     TRIMETH/SULFA <=10 SENSITIVE Sensitive     CLINDAMYCIN <=0.25 SENSITIVE Sensitive     RIFAMPIN <=0.5 SENSITIVE Sensitive     Inducible Clindamycin NEGATIVE Sensitive     * MODERATE STAPHYLOCOCCUS AUREUS  Aerobic/Anaerobic Culture (surgical/deep wound)     Status: None   Collection Time: 04/11/2020  2:58 PM   Specimen: PATH Other; Tissue  Result Value Ref Range Status   Specimen Description   Final    TISSUE SHOULDER RIGHT Performed at [facility] [address] [place], [place] [address]    Special Requests   Final    NONE Performed at [facility], [place] [address], [place], [place] [address]    Gram Stain   Final    ABUNDANT WBC PRESENT, PREDOMINANTLY PMN ABUNDANT GRAM  POSITIVE COCCI    Culture   Final    ABUNDANT STAPHYLOCOCCUS AUREUS NO ANAEROBES ISOLATED Performed at [facility], [place] [address], [place], [place] [address]    Report Status 05/01/2020 FINAL  Final   Organism ID, Bacteria STAPHYLOCOCCUS AUREUS  Final      Susceptibility   Staphylococcus aureus - MIC*    CIPROFLOXACIN <=0.5 SENSITIVE Sensitive     ERYTHROMYCIN <=0.25 SENSITIVE Sensitive     GENTAMICIN <=0.5 SENSITIVE Sensitive     OXACILLIN 0.5 SENSITIVE Sensitive     TETRACYCLINE <=1 SENSITIVE Sensitive     VANCOMYCIN <=0.5 SENSITIVE Sensitive     TRIMETH/SULFA <=10 SENSITIVE Sensitive     CLINDAMYCIN <=0.25 SENSITIVE Sensitive     RIFAMPIN <=0.5 SENSITIVE Sensitive     Inducible Clindamycin NEGATIVE Sensitive     * ABUNDANT STAPHYLOCOCCUS AUREUS  Culture, blood (Routine X 2) w Reflex to ID Panel     Status: None   Collection Time: 04/26/20  9:58 AM   Specimen: BLOOD RIGHT HAND  Result Value Ref Range Status   Specimen Description   Final    BLOOD RIGHT HAND Performed at [facility] [address], [place], [place] [address]    Special Requests   Final    BOTTLES DRAWN AEROBIC ONLY Blood Culture results may not be optimal due to an inadequate volume of blood received in culture bottles Performed at [facility] [address] [place], [place] [address]    Culture   Final    NO GROWTH 5 DAYS Performed at [facility], [place] [address] [place],  [address]    Report Status 05/01/2020 FINAL  Final  Culture, blood (Routine  X 2) w Reflex to ID Panel     Status: None   Collection Time: 04/26/20  9:58 AM   Specimen: BLOOD RIGHT HAND  Result Value Ref Range Status   Specimen Description   Final    BLOOD RIGHT HAND Performed at [facility] [address] [place], [place] [address]    Special Requests   Final    BOTTLES DRAWN AEROBIC ONLY Blood Culture results may not be optimal due to an  inadequate volume of blood received in culture bottles Performed at [facility] [address], [place], [place] [address]    Culture   Final    NO GROWTH 5 DAYS Performed at [facility], [place] [address], [place], [place] [address]    Report Status 05/01/2020 FINAL  Final  Surgical pcr screen     Status: Abnormal   Collection Time: 04/23/2020  2:39 AM   Specimen: Nasal Mucosa; Nasal Swab  Result Value Ref Range Status   MRSA, PCR NEGATIVE NEGATIVE Final   Staphylococcus aureus POSITIVE (A) NEGATIVE Final    Comment: (NOTE) The Xpert SA Assay (FDA approved for NASAL specimens in patients 63 years of age and older), is one component of a comprehensive surveillance program. It is not intended to diagnose infection nor to guide or monitor treatment. Performed at [facility], [place] [address] [place], [place] [address]   Aerobic/Anaerobic Culture (surgical/deep wound)     Status: None (Preliminary result)   Collection Time: 04/13/2020 11:01 AM   Specimen: Other Source  Result Value Ref Range Status   Specimen Description TISSUE  Final   Special Requests   Final    MITRAL VALVE VEGETATION SPEC A ANTIBIOTICS ZINACEF VANCOMYCIN   Gram Stain   Final    ABUNDANT WBC PRESENT, PREDOMINANTLY PMN NO ORGANISMS SEEN Performed at [facility], [place] [address], [place], [place] [address]    Culture   Final    RARE STAPHYLOCOCCUS AUREUS CRITICAL RESULT CALLED TO, READ BACK BY AND VERIFIED WITH: RN Mellissa Kohut [id number] AT 1212 BY CM NO ANAEROBES ISOLATED; CULTURE IN PROGRESS FOR 5 DAYS    Report Status PENDING  Incomplete   Organism ID, Bacteria STAPHYLOCOCCUS AUREUS  Final      Susceptibility   Staphylococcus aureus - MIC*    CIPROFLOXACIN <=0.5 SENSITIVE Sensitive     ERYTHROMYCIN <=0.25 SENSITIVE Sensitive     GENTAMICIN <=0.5 SENSITIVE Sensitive     OXACILLIN 0.5 SENSITIVE Sensitive     TETRACYCLINE <=1 SENSITIVE Sensitive     VANCOMYCIN 1 SENSITIVE  Sensitive     TRIMETH/SULFA <=10 SENSITIVE Sensitive     CLINDAMYCIN <=0.25 SENSITIVE Sensitive     RIFAMPIN <=0.5 SENSITIVE Sensitive     Inducible Clindamycin NEGATIVE Sensitive     * RARE STAPHYLOCOCCUS AUREUS     Janene Madeira, MSN, NP-C [facility] [email or web address] Pager: [phone] Office: [phone] [place]: [phone]

## 2020-05-04 NOTE — Progress Notes (Signed)
Patient ID: Jonathan Shaffer, male   DOB: January 31, 1966, 54 y.o.   MRN: [id number] Patient seen in follow-up for the ischemic changes bilateral feet.  Examination the blister on the plantar aspect great toe MTP joint has demarcated.  Suture scissors and pickups were used to remove the overlying skin the fluid was consistent with a hematoma there was no purulent abscess.  The tissue beneath the blistered skin is ischemic capillary refill is greater than 10 seconds.  There was a small area of fluctuance, this was opened with a 11 blade knife after sterilely prepping with Betadine, there is ischemic tissue but no abscess.  The knee-high compression socks were applied for both lower extremities.  Discussed with the family that I prefer to wait to improve the microcirculation around the zone of injury.  Discussed that at a minimum patient would require a first ray amputation of the left foot.  If patient's white blood cell coun continues to get worse we would be forced to proceed with surgery sooner rather than later.  The capillary refill in the right foot lateral column is about 6 seconds the tissue is ischemic but there is no fluctuance no signs of abscess there is no cellulitis in either foot.

## 2020-05-04 NOTE — Progress Notes (Signed)
4 Days Post-Op Procedure(s) (LRB): MITRAL VALVE (MV) REPLACEMENT USING ON-X MITRAL VALVE SIZE 31/33 MM,  PERMANENT EPICARDIAL PACEMAKING SYSTEM IMPLANT USING MEDTRONIC LEADS (N/A) TRANSESOPHAGEAL ECHOCARDIOGRAM (TEE) (N/A) Subjective: Feeling better  Objective: Vital signs in last 24 hours: Temp:  [98 F (36.7 C)-98.3 F (36.8 C)] 98 F (36.7 C) (12/01 1542) Pulse Rate:  [63-92] 86 (12/01 1800) Cardiac Rhythm: Atrial paced (12/01 1600) Resp:  [17-29] 17 (12/01 1800) BP: (110-153)/(74-125) 132/80 (12/01 1800) SpO2:  [90 %-100 %] 90 % (12/01 1800) Weight:  [140 kg] 140 kg (12/01 0600)  Hemodynamic parameters for last 24 hours:    Intake/Output from previous day: 11/30 0701 - 12/01 0700 In: 1496.3 [P.O.:540; I.V.:330.9; IV Piggyback:625.4] Out: 3725 [Urine:2975; Chest Tube:750] Intake/Output this shift: No intake/output data recorded.  General appearance: alert and cooperative Neurologic: intact Heart: regular rate and rhythm, S1, S2 normal, no murmur, click, rub or gallop Lungs: clear to auscultation bilaterally Abdomen: soft, non-tender; bowel sounds normal; no masses,  no organomegaly Extremities: extremities normal, atraumatic, no cyanosis or edema, edema 2+ and Homans sign is negative, no sign of DVT Wound: dressed, dry  Lab Results: Recent Labs    05/03/20 0424 05/04/20 0331  WBC 30.3* 26.2*  HGB 10.1* 9.4*  HCT 31.1* 28.9*  PLT 311 286   BMET:  Recent Labs    05/03/20 0424 05/04/20 0331  NA 135 136  K 4.4 3.8  CL 98 100  CO2 22 23  GLUCOSE 144* 143*  BUN 32* 34*  CREATININE 2.92* 3.07*  CALCIUM 6.9* 6.9*    PT/INR:  Recent Labs    05/04/20 0331  LABPROT 42.2*  INR 4.6*   ABG    Component Value Date/Time   PHART 7.419 04/17/2020 1823   HCO3 18.8 (L) 04/24/2020 1823   TCO2 20 (L) 04/22/2020 1823   ACIDBASEDEF 5.0 (H) 04/07/2020 1823   O2SAT 97.0 04/24/2020 1823   CBG (last 3)  Recent Labs    05/04/20 0621 05/04/20 1112 05/04/20 1537   GLUCAP 115* 128* 159*    Assessment/Plan: S/P Procedure(s) (LRB): MITRAL VALVE (MV) REPLACEMENT USING ON-X MITRAL VALVE SIZE 31/33 MM,  PERMANENT EPICARDIAL PACEMAKING SYSTEM IMPLANT USING MEDTRONIC LEADS (N/A) TRANSESOPHAGEAL ECHOCARDIOGRAM (TEE) (N/A) Mobilize Diuresis leave pleural tubes Watch kidney function  LOS: 12 days    Jonathan Shaffer 05/04/2020

## 2020-05-04 NOTE — Progress Notes (Signed)
Inpatient Rehabilitation-Admissions Coordinator   CIR consult order received. Met with pt and his wife bedside for IP Rehab assessment. Discussed recommended rehab program, including expectations, anticipated LOS, and expected functional outcomes. The patient and his wife are interested in this program. Confirmed DC support from wife. Discussed that the patient is not yet medically ready to start his IP Rehab program and that I will follow while his medical workup continues and continue to assess his functional need for CIR daily. If pt remains a candidate once medically ready for CIR, will need to start insurance auth process at that time.   Will follow along.    [place], OTR/L  Rehab Admissions Coordinator  [phone] 05/04/2020 12:28 PM  

## 2020-05-04 NOTE — Progress Notes (Signed)
Occupational Therapy Treatment Patient Details Name: Jonathan Shaffer MRN: [id number] DOB: May 23, 1966 Today's Date: 05/04/2020    History of present illness Jonathan Shaffer is a 54 y.o. 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 Underwent TEE on 04/23/2020 resulting in mitral valve vegetation and perivalvular abscess. MVR and temp pacemaker on 11/27 with thromboembolic event to bil LEs with left worse than right.  Dr. Sharol Given consulted and order compression hose and Darco shoe for left and cast shoe for right.  PT not reordered until 11/29.  Re-eval 11/30 by PT/OT.   Pt also with new onset DM2. PMH: thyroid disease, testicular cancer   OT comments  Pt progressing this visit in all mobility and demonstrated ability to ambulate with 2 person assist and eva walker with chair closely following. Pt fatigues easily. Continues to demonstrate slow processing and difficulty recalling/generalizing sternal precautions.   Follow Up Recommendations  CIR    Equipment Recommendations  3 in 1 bedside commode;Tub/shower bench    Recommendations for Other Services      Precautions / Restrictions Precautions Precautions: Fall;Sternal Precaution Booklet Issued: No Precaution Comments: 1 chest tube, temporary pacemaker Restrictions Weight Bearing Restrictions: Yes RUE Weight Bearing: Weight bearing as tolerated (in post op shoe) LUE Weight Bearing: Partial weight bearing LUE Partial Weight Bearing Percentage or Pounds: in darco shoe Other Position/Activity Restrictions: sternal precautions, Dr Sharol Given orderd Darco shoe left LE and cast shoe right LE for comfort       Mobility Bed Mobility Overal bed mobility: Needs Assistance Bed Mobility:  Rolling;Sidelying to Sit Rolling: +2 for physical assistance;Mod assist Sidelying to sit: +2 for physical assistance;Mod assist      General bed mobility comments: cues for technique, assist for hips to EOB with bed pad and to raise trunk  Transfers Overall transfer level: Needs assistance Equipment used: Rolling walker (2 wheeled) Transfers: Sit to/from Stand Sit to Stand: +2 physical assistance;Min assist         General transfer comment: cues for hands on knees, use of momentum from elevated bed, assist of bed pad under hips to assist pt to rise    Balance Overall balance assessment: Needs assistance Sitting-balance support: Feet supported;No upper extremity supported Sitting balance-Leahy Scale: Fair Sitting balance - Comments: close guard assist   Standing balance support: Bilateral upper extremity supported;During functional activity Standing balance-Leahy Scale: Poor Standing balance comment: relies on UE support of Eva walker                           ADL either performed or assessed with clinical judgement   ADL Overall ADL's : Needs assistance/impaired                 Upper Body Dressing : Maximal assistance;Sitting Upper Body Dressing Details (indicate cue type and reason): front opening gown Lower Body Dressing: Total assistance;Sitting/lateral leans                       Vision       Perception     Praxis      Cognition Arousal/Alertness: Awake/alert Behavior During Therapy: Flat affect Overall Cognitive Status: Impaired/Different from baseline Area of Impairment: Attention;Memory;Following commands;Safety/judgement;Problem solving  Current Attention Level: Sustained Memory: Decreased short-term memory Following Commands: Follows one step commands inconsistently;Follows one step commands with increased time Safety/Judgement: Decreased awareness of safety;Decreased awareness of deficits   Problem  Solving: Slow processing;Requires verbal cues;Requires tactile cues General Comments: pt needing repeated cues to follow commands and adhere to sternal precautions.         Exercises Exercises: Other exercises   Shoulder Instructions       General Comments VSS during gait.      Pertinent Vitals/ Pain       Pain Assessment: Faces Faces Pain Scale: Hurts whole lot Pain Location: generalized Pain Descriptors / Indicators: Guarding;Grimacing;Operative site guarding Pain Intervention(s): Repositioned;Monitored during session  Home Living                                          Prior Functioning/Environment              Frequency  Min 2X/week        Progress Toward Goals  OT Goals(current goals can now be found in the care plan section)  Progress towards OT goals: Progressing toward goals  Acute Rehab OT Goals Patient Stated Goal: to return to PLOF OT Goal Formulation: With patient Time For Goal Achievement: 05/12/20 Potential to Achieve Goals: Good  Plan Discharge plan remains appropriate    Co-evaluation    PT/OT/SLP Co-Evaluation/Treatment: Yes Reason for Co-Treatment: Complexity of the patient's impairments (multi-system involvement);For patient/therapist safety PT goals addressed during session: Mobility/safety with mobility OT goals addressed during session: Proper use of Adaptive equipment and DME      AM-PAC OT "6 Clicks" Daily Activity     Outcome Measure   Help from another person eating meals?: A Little Help from another person taking care of personal grooming?: A Little Help from another person toileting, which includes using toliet, bedpan, or urinal?: Total Help from another person bathing (including washing, rinsing, drying)?: A Lot Help from another person to put on and taking off regular upper body clothing?: A Lot Help from another person to put on and taking off regular lower body clothing?: Total 6 Click Score: 12     End of Session Equipment Utilized During Treatment: Other (comment) (eva walker)  OT Visit Diagnosis: Pain;Muscle weakness (generalized) (M62.81)   Activity Tolerance Patient limited by fatigue   Patient Left in chair;with call bell/phone within reach;with family/visitor present   Nurse Communication Mobility status        Time: 0940-1005 OT Time Calculation (min): 25 min  Charges: OT General Charges $OT Visit: 1 Visit OT Treatments $Therapeutic Activity: 8-22 mins  Nestor Lewandowsky, OTR/L Acute Rehabilitation Services Pager: [phone] Office: [phone]   Malka So 05/04/2020, 10:54 AM

## 2020-05-04 NOTE — Progress Notes (Signed)
Progress Note  Patient Name: Jonathan Shaffer Date of Encounter: 05/04/2020  [facility] Cardiologist: Candee Furbish, MD   Subjective   Tired, achy back, denies SOB  Inpatient Medications    Scheduled Meds: . sodium chloride   Intravenous Once  . sodium chloride   Intravenous Once  . aspirin EC  81 mg Oral Daily  . atorvastatin  40 mg Oral Daily  . bisacodyl  10 mg Oral Daily   Or  . bisacodyl  10 mg Rectal Daily  . Chlorhexidine Gluconate Cloth  6 each Topical Daily  . colchicine  0.6 mg Oral BID  . docusate sodium  200 mg Oral Daily  . feeding supplement  237 mL Oral BID BM  . furosemide  80 mg Intravenous Once  . insulin aspart  2-6 Units Subcutaneous Q4H  . insulin detemir  8 Units Subcutaneous Q12H  . lactulose  30 g Oral TID  . loratadine  10 mg Oral Daily  . mouth rinse  15 mL Mouth Rinse BID  . multivitamin with minerals  1 tablet Oral Daily  . mupirocin ointment  1 application Nasal BID  . pantoprazole  40 mg Oral Daily  . sodium chloride flush  10-40 mL Intracatheter Q12H  . sodium chloride flush  3 mL Intravenous Q12H  . Warfarin - Pharmacist Dosing Inpatient   Does not apply q1600   Continuous Infusions: . sodium chloride Stopped (05/01/20 1540)  . sodium chloride    . sodium chloride    . lactated ringers    . lactated ringers Stopped (04/22/2020 1638)  . lactated ringers Stopped (05/02/20 1528)  . nafcillin (NAFCIL) continuous infusion 20.8 mL/hr at 05/04/20 0600   PRN Meds: sodium chloride, dextrose, HYDROmorphone (DILAUDID) injection, lactated ringers, lip balm, metoprolol tartrate, ondansetron (ZOFRAN) IV, oxyCODONE, QUEtiapine, sodium chloride flush, sodium chloride flush, traMADol   Vital Signs    Vitals:   05/04/20 0353 05/04/20 0400 05/04/20 0500 05/04/20 0600  BP:  (!) 143/85 (!) 142/81 139/79  Pulse:  81 86 90  Resp:  (!) 25 (!) 24 (!) 24  Temp: 98.2 F (36.8 C)     TempSrc: Oral     SpO2:  99% 98% 99%  Weight:    (!) 140 kg    Height:        Intake/Output Summary (Last 24 hours) at 05/04/2020 0922 Last data filed at 05/04/2020 0600 Gross per 24 hour  Intake 1256.31 ml  Output 3725 ml  Net -2468.69 ml   Last 3 Weights 05/04/2020 05/03/2020 05/02/2020  Weight (lbs) 308 lb 10.3 oz 308 lb 12.8 oz 306 lb 14.1 oz  Weight (kg) 140 kg 140.071 kg 139.2 kg      Telemetry    HR 80's,  - Personally Reviewed  ECG    Low atrial rhythm, 87bpm - Personally Reviewed  Physical Exam   GEN: No acute distress, ill apearing.   Neck: No JVD Cardiac: RRR, no murmurs, rubs, or gallops.  Respiratory: slightly diminished at the bases. GI: Soft, nontender MS: 1+ edema; No deformity. Neuro:  Nonfocal  Psych: Normal affect   Labs    High Sensitivity Troponin:  No results for input(s): TROPONINIHS in the last 720 hours.    Chemistry Recent Labs  Lab 05/01/20 1741 05/01/20 1741 05/02/20 0407 05/03/20 0424 05/04/20 0331  NA 140   < > 135 135 136  K 4.1   < > 4.5 4.4 3.8  CL 106   < > 101  98 100  CO2 21*   < > 22 22 23   GLUCOSE 111*   < > 128* 144* 143*  BUN 19   < > 25* 32* 34*  CREATININE 2.02*   < > 2.46* 2.92* 3.07*  CALCIUM 6.8*   < > 7.3* 6.9* 6.9*  PROT 5.4*  --   --  5.6* 5.9*  ALBUMIN 1.8*  --   --  1.4* 1.7*  AST 3,257*  --   --  1,021* 480*  ALT 984*  --   --  686* 517*  ALKPHOS 123  --   --  372* 348*  BILITOT 2.6*  --   --  3.7* 3.1*  GFRNONAA 38*   < > 30* 25* 23*  ANIONGAP 13   < > 12 15 13    < > = values in this interval not displayed.     Hematology Recent Labs  Lab 05/02/20 0407 05/03/20 0424 05/04/20 0331  WBC 26.2* 30.3* 26.2*  RBC 3.18* 3.36* 3.13*  HGB 9.7* 10.1* 9.4*  HCT 29.1* 31.1* 28.9*  MCV 91.5 92.6 92.3  MCH 30.5 30.1 30.0  MCHC 33.3 32.5 32.5  RDW 16.2* 16.3* 16.8*  PLT 279 311 286    BNPNo results for input(s): BNP, PROBNP in the last 168 hours.   DDimer No results for input(s): DDIMER in the last 168 hours.   Radiology    DG Chest 1 View Result Date:  05/04/2020 CLINICAL DATA:  Post MVR, chest tube EXAM: CHEST  1 VIEW COMPARISON:  Portable exam 0553 hours compared to 05/03/2020 FINDINGS: LEFT jugular central venous catheter unchanged. BILATERAL thoracostomy tubes unchanged. Enlargement of cardiac silhouette post median sternotomy and MVR. Pacing leads project over chest. Bibasilar atelectasis greater on LEFT with small LEFT pleural effusion. Upper lungs clear. No pneumothorax. IMPRESSION: Bibasilar atelectasis LEFT greater than RIGHT. Small LEFT pleural effusion. Electronically Signed   By: Lavonia Dana M.D.   On: 05/04/2020 08:18    Result Date: 05/04/2020 CLINICAL DATA:  Occluded EXAM: ABDOMEN - 1 VIEW COMPARISON:  CT abdomen and pelvis April 22, 2020 FINDINGS: Loops of mildly dilated bowel throughout the abdomen persist without appreciable air-fluid levels. No free air evident on supine imaging. Multiple surgical clips noted throughout the abdomen and upper pelvis. IMPRESSION: Suspect postoperative ileus, although a degree of partial bowel obstruction cannot be excluded in this circumstance. No free air appreciable on supine examination. Multiple surgical clips present. Electronically Signed   By: Lowella Grip III M.D.   On: 05/04/2020 08:52    [place] LT LOWER EXTREM LTD SOFT TISSUE NON VASCULAR Result Date: 05/03/2020 CLINICAL DATA:  Follow-up left great toe lesion EXAM: ULTRASOUND  LEFT FIRST TOE TECHNIQUE: Ultrasound examination of the lower extremity soft tissues was performed in the area of clinical concern. COMPARISON:  Ultrasound April 24, 2020. FINDINGS: Decreased size of the complex fluid collection along the medial aspect of left great toe which now measures 2.2 x 2.2 x 1.3 cm previously 2.4 x 2.3 x 1.5 cm. IMPRESSION: Interval decrease in size of the phlegmonous collection/developing abscess along the medial aspect of the left great toe. Electronically Signed   By: Dahlia Bailiff MD   On: 05/03/2020 12:47    VAS US RENAL ARTERY  DUPLEX Result Date: 05/03/2020 ABDOMINAL VISCERAL Indications: Endocarditis, acute kidney injury concern for embolization to renal              artery. High Risk Factors: Hypertension, hyperlipidemia, Diabetes, coronary artery  disease. Other Factors: Status post MVR on 04/06/2020. Limitations: Air/bowel gas and obesity. Performing Technologist: Oda Cogan RDMS, RVT  Examination Guidelines: A complete evaluation includes B-mode imaging, spectral Doppler, color Doppler, and power Doppler as needed of all accessible portions of each vessel. Bilateral testing is considered an integral part of a complete examination. Limited examinations for reoccurring indications may be performed as noted.   Summary: Renal:  Right: Patent renal artery. Left:  Patent renal artery.  *See table(s) above for measurements and observations.  Diagnosing physician: Deitra Mayo MD  Electronically signed by Deitra Mayo MD on 05/03/2020 at 7:56:09 PM.    Final     Cardiac Studies   CARDIAC CATH: 04/27/2020  Prox Cx lesion is 25% stenosed.  Mid LAD lesion is 10% stenosed.  LV end diastolic pressure is moderately elevated. LVEDP 29 mm Hg.  There is no aortic valve stenosis.  Hemodynamic findings consistent with mild pulmonary hypertension.  Ao sat 98%, PA sat 68%, PA pressure 41/20, mean PA 31 mm Hg; mean PCWP 28 mm Hg; CO 8.1 L/min; CI 3.2  Nonobstructive CAD.   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   Continue with plans for mitral valve intervention with Dr. Orvan Seen.   ECHO: 04/07/2020 1. Left ventricular ejection fraction, by estimation, is 55 to 60%. The  left ventricle has normal function. The left ventricle has no regional  wall motion abnormalities. Left ventricular diastolic function could not  be evaluated.  2. The mitral valve is grossly normal. No evidence of mitral valve  regurgitation. No evidence of mitral stenosis.  3. The aortic valve is tricuspid.  Aortic valve regurgitation is not  visualized. No aortic stenosis is present.   TEE: 04/17/2020 1.MITRAL VALVE:There is a large echodensity that is partially fixed (below the valve on the ventricular side and possibly representing an abscess) and  partially hypermobile, multilobar ( on the left atrial side) consistent  with a vegetation with high risk for embolization. This involves P2,3 and A3 leaflets. Vegetation size is 4.4 x 2.4 cm.  2. Left ventricular ejection fraction, by estimation, is 55 to 60%. The  left ventricle has normal function. The left ventricle has no regional  wall motion abnormalities. Left ventricular diastolic function could not  be evaluated.  3. Right ventricular systolic function is normal. The right ventricular  size is normal.  4. No left atrial/left atrial appendage thrombus was detected.  5.NO, INCORRECTThe mitral valve is normal in structure. Mild mitral valve  regurgitation. No evidence of mitral stenosis.  6. The aortic valve is normal in structure. Aortic valve regurgitation is  not visualized. No aortic stenosis is present.  7. The inferior vena cava is normal in size with greater than 50%  respiratory variability, suggesting right atrial pressure of 3 mmHg.   Conclusion(s)/Recommendation(s): Normal biventricular function without  evidence of hemodynamically significant valvular heart disease. CT surgery  and hospitalist Dr Avon Gully were notified at 1:10 pm.The patient will be  [facility] the [facility].   Patient Profile     54 y.o. male cancer, admitted with new DM/DKA, and disseminated MSSA bacteremia/endocarditis  S/p  04/12/2020  Mitral valve replacement with 33 mm On-X mechanical prosthesis  Implantation of permanent epicardial pacemaking system with leads on the right atrium and right ventricle  Assessment & Plan    1. Post-op junctional rhythm/sinus node dysfunction     Metoprolol last dose was 05/02/2020 at 1000     He has  permanent epicardial wires in place  Has temp pacing support via temp epicardial wires  Today his has his own intrinsic rhythm, looks like a low atrial rhythm 87bpm Tele HR 80's I have turned back up pacing to 60  I think he may not require generator placement. Dr. Rayann Heman will see later tyoday  Management of his numerous other issues as per  Disseminated MSSA bacteremia LFTs slowly improving AKI, Creat rising Edema, volume OL  CCM CTS nephrology ID     For questions or updates, please contact [facility] Please consult [email or web address] for contact info under        Signed, Baldwin [place], PA-C  05/04/2020, 9:22 AM

## 2020-05-04 NOTE — Progress Notes (Signed)
ANTICOAGULATION CONSULT NOTE  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   No Known Allergies  Patient Measurements: Height: 6\' 3"  (190.5 cm) Weight: (!) 140 kg (308 lb 10.3 oz) IBW/kg (Calculated) : 84.5  Vital Signs: Temp: 98.3 F (36.8 C) (12/01 1114) Temp Source: Oral (12/01 1114) BP: 149/89 (12/01 1300) Pulse Rate: 90 (12/01 1300)  Labs: Recent Labs    05/02/20 0407 05/02/20 0407 05/03/20 0424 05/04/20 0331  HGB 9.7*   < > 10.1* 9.4*  HCT 29.1*  --  31.1* 28.9*  PLT 279  --  311 286  LABPROT  --   --  22.4* 42.2*  INR  --   --  2.0* 4.6*  CREATININE 2.46*  --  2.92* 3.07*   < > = values in this interval not displayed.    Estimated Creatinine Clearance: 41.5 mL/min (A) (by C-G formula based on SCr of 3.07 mg/dL (H)).   Medical History: Past Medical History:  Diagnosis Date  . Cancer ([place])   . Diabetes mellitus without complication ([place])   . High cholesterol   . Testicle cancer ([place])   . Thyroid disease   . 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 Hgb stable post-op, currently at 9.4, plt wnl. No bleeding noted.   INR up significantly this AM from 2>4.6 after initial dose of 5mg  on 11/29 and 2.5 last night. Patient's LFTs remain elevated indicating acute liver dysfunction that could be impacting INR but they are improving.   Goal of Therapy:  INR goal 2.5-3.5 Monitor platelets by anticoagulation protocol: Yes   Plan:  Hold warfarin for now until INR trends back down Daily INR Warfarin education prior to discharge  Erin Hearing PharmD., BCPS Clinical Pharmacist 05/04/2020 2:05 PM

## 2020-05-04 NOTE — Progress Notes (Signed)
Physical Therapy Treatment Patient Details Name: Jonathan Shaffer MRN: [id number] DOB: 1965-06-21 Today's Date: 05/04/2020    History of Present Illness Jonathan Shaffer is a 54 y.o. 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 Underwent TEE on 04/04/2020 resulting in mitral valve vegetation and perivalvular abscess. MVR and temp pacemaker on 11/27 with thromboembolic event to bil LEs with left worse than right.  Dr. Sharol Given consulted and order compression hose and Darco shoe for left and cast shoe for right.  PT not reordered until 11/29.  Re-eval 11/30 by PT/OT.   Pt also with new onset DM2. PMH: thyroid disease, testicular cancer    PT Comments    Pt admitted with above diagnosis. Pt was able to ambulate with min assist of 2 with Harmon Pier walker progressing distance today. Needs cues for sequencing steps and eva walker.  Needed to have chair brought up behind him due to fatigue.  Continue acute PT.  Pt currently with functional limitations due to the deficits listed below (see PT Problem List). Pt will benefit from skilled PT to increase their independence and safety with mobility to allow discharge to the venue listed below.     Follow Up Recommendations  CIR;Supervision/Assistance - 24 hour     Equipment Recommendations  Rolling walker with 5" wheels;3in1 (PT)    Recommendations for Other Services OT consult     Precautions / Restrictions Precautions Precautions: Fall;Sternal Precaution Booklet Issued: No Precaution Comments: 1 chest tube, temporary pacemaker Restrictions Other Position/Activity Restrictions: sternal precautions, Dr Sharol Given orderd Darco shoe left LE and cast shoe right LE for comfort    Mobility  Bed  Mobility Overal bed mobility: Needs Assistance Bed Mobility: Rolling;Sit to Sidelying Rolling: Max assist;+2 for physical assistance;Mod assist Sidelying to sit: +2 for physical assistance;Mod assist Supine to sit: Mod assist;+2 for physical assistance;HOB elevated     General bed mobility comments: Pt needed max assist for lines management, LEs movement and trunk assist to sit to EOB and follow precautions.   Transfers Overall transfer level: Needs assistance Equipment used:  Harmon Pier walker) Transfers: Sit to/from Stand Sit to Stand: Min assist;From elevated surface;+2 safety/equipment;Mod assist         General transfer comment: Pt does best if he pushes up on knees and use of pad with bed elevated for sit to stand.    Ambulation/Gait Ambulation/Gait assistance: Min assist;+2 safety/equipment Gait Distance (Feet): 12 Feet Assistive device:  (Eva walker) Gait Pattern/deviations: Step-to pattern;Wide base of support;Decreased stride length;Decreased step length - left;Decreased stance time - left;Decreased weight shift to left;Antalgic;Drifts right/left;Trunk flexed Gait velocity: reduced Gait velocity interpretation: 1.31 - 2.62 ft/sec, indicative of limited community ambulator General Gait Details: Pt was able to ambulate to door with Harmon Pier walker with min assist of 2 with cues.  Pt needed to sit down at door to rest.  Pt fatigues quickly. Close +2 asssit as pt with unequal step length with pt left LE lagging behing the right considerably.  Did advance gait however pt was very fatigued.    Stairs             Wheelchair Mobility    Modified Rankin (Stroke Patients Only)       Balance Overall balance assessment: Needs assistance Sitting-balance support: Feet supported;No upper extremity supported Sitting  balance-Leahy Scale: Fair Sitting balance - Comments: close guard assist   Standing balance support: Bilateral upper extremity supported;During functional  activity Standing balance-Leahy Scale: Poor Standing balance comment: relies on UE support                            Cognition Arousal/Alertness: Awake/alert Behavior During Therapy: Flat affect Overall Cognitive Status: Impaired/Different from baseline Area of Impairment: Safety/judgement;Problem solving;Following commands;Memory                     Memory: Decreased short-term memory Following Commands: Follows one step commands inconsistently;Follows one step commands with increased time Safety/Judgement: Decreased awareness of safety;Decreased awareness of deficits   Problem Solving: Slow processing;Requires verbal cues;Requires tactile cues General Comments: very anxious limiting PT/OT session. Poor recall of sternal precautions      Exercises      General Comments General comments (skin integrity, edema, etc.): VSS during gait.        Pertinent Vitals/Pain Pain Assessment: Faces Faces Pain Scale: Hurts whole lot Pain Location: generalized Pain Descriptors / Indicators: Guarding;Grimacing;Operative site guarding Pain Intervention(s): Limited activity within patient's tolerance;Monitored during session;Repositioned    Home Living                      Prior Function            PT Goals (current goals can now be found in the care plan section) Acute Rehab PT Goals Patient Stated Goal: to return to PLOF Progress towards PT goals: Progressing toward goals    Frequency    Min 3X/week      PT Plan Current plan remains appropriate    Co-evaluation PT/OT/SLP Co-Evaluation/Treatment: Yes Reason for Co-Treatment: Complexity of the patient's impairments (multi-system involvement);For patient/therapist safety PT goals addressed during session: Mobility/safety with mobility        AM-PAC PT "6 Clicks" Mobility   Outcome Measure  Help needed turning from your back to your side while in a flat bed without using bedrails?: A Lot Help  needed moving from lying on your back to sitting on the side of a flat bed without using bedrails?: A Lot Help needed moving to and from a bed to a chair (including a wheelchair)?: A Lot Help needed standing up from a chair using your arms (e.g., wheelchair or bedside chair)?: A Lot Help needed to walk in hospital room?: A Lot Help needed climbing 3-5 steps with a railing? : Total 6 Click Score: 11    End of Session Equipment Utilized During Treatment: Gait belt Activity Tolerance: Patient limited by fatigue;Patient limited by pain Patient left: with call bell/phone within reach;with family/visitor present;in chair Nurse Communication: Mobility status PT Visit Diagnosis: Difficulty in walking, not elsewhere classified (R26.2);Pain Pain - Right/Left: Right Pain - part of body: Shoulder (generalized)     Time: 0940-1005 PT Time Calculation (min) (ACUTE ONLY): 25 min  Charges:  $Gait Training: 8-22 mins                     Alaiza Yau W,PT [place] Pager:  [phone]  Office:  [place] 05/04/2020, 10:47 AM

## 2020-05-04 DEATH — deceased

## 2020-05-05 ENCOUNTER — Telehealth: Payer: Self-pay

## 2020-05-05 DIAGNOSIS — L02413 Cutaneous abscess of right upper limb: Secondary | ICD-10-CM | POA: Diagnosis not present

## 2020-05-05 LAB — GLUCOSE, CAPILLARY
Glucose-Capillary: 109 mg/dL — ABNORMAL HIGH (ref 70–99)
Glucose-Capillary: 158 mg/dL — ABNORMAL HIGH (ref 70–99)
Glucose-Capillary: 182 mg/dL — ABNORMAL HIGH (ref 70–99)
Glucose-Capillary: 182 mg/dL — ABNORMAL HIGH (ref 70–99)
Glucose-Capillary: 67 mg/dL — ABNORMAL LOW (ref 70–99)
Glucose-Capillary: 94 mg/dL (ref 70–99)

## 2020-05-05 LAB — CBC WITH DIFFERENTIAL/PLATELET
Abs Immature Granulocytes: 0.38 10*3/uL — ABNORMAL HIGH (ref 0.00–0.07)
Basophils Absolute: 0.1 10*3/uL (ref 0.0–0.1)
Basophils Relative: 0 %
Eosinophils Absolute: 0 10*3/uL (ref 0.0–0.5)
Eosinophils Relative: 0 %
HCT: 28.8 % — ABNORMAL LOW (ref 39.0–52.0)
Hemoglobin: 9.6 g/dL — ABNORMAL LOW (ref 13.0–17.0)
Immature Granulocytes: 2 %
Lymphocytes Relative: 8 %
Lymphs Abs: 1.8 10*3/uL (ref 0.7–4.0)
MCH: 30.3 pg (ref 26.0–34.0)
MCHC: 33.3 g/dL (ref 30.0–36.0)
MCV: 90.9 fL (ref 80.0–100.0)
Monocytes Absolute: 0.9 10*3/uL (ref 0.1–1.0)
Monocytes Relative: 4 %
Neutro Abs: 20.3 10*3/uL — ABNORMAL HIGH (ref 1.7–7.7)
Neutrophils Relative %: 86 %
Platelets: 334 10*3/uL (ref 150–400)
RBC: 3.17 MIL/uL — ABNORMAL LOW (ref 4.22–5.81)
RDW: 17.1 % — ABNORMAL HIGH (ref 11.5–15.5)
WBC: 23.5 10*3/uL — ABNORMAL HIGH (ref 4.0–10.5)
nRBC: 0.6 % — ABNORMAL HIGH (ref 0.0–0.2)

## 2020-05-05 LAB — HEPATIC FUNCTION PANEL
ALT: 343 U/L — ABNORMAL HIGH (ref 0–44)
AST: 198 U/L — ABNORMAL HIGH (ref 15–41)
Albumin: 1.5 g/dL — ABNORMAL LOW (ref 3.5–5.0)
Alkaline Phosphatase: 327 U/L — ABNORMAL HIGH (ref 38–126)
Bilirubin, Direct: 0.9 mg/dL — ABNORMAL HIGH (ref 0.0–0.2)
Indirect Bilirubin: 2.3 mg/dL — ABNORMAL HIGH (ref 0.3–0.9)
Total Bilirubin: 3.2 mg/dL — ABNORMAL HIGH (ref 0.3–1.2)
Total Protein: 6.3 g/dL — ABNORMAL LOW (ref 6.5–8.1)

## 2020-05-05 LAB — AEROBIC/ANAEROBIC CULTURE W GRAM STAIN (SURGICAL/DEEP WOUND)

## 2020-05-05 LAB — RENAL FUNCTION PANEL
Albumin: 1.5 g/dL — ABNORMAL LOW (ref 3.5–5.0)
Anion gap: 13 (ref 5–15)
BUN: 35 mg/dL — ABNORMAL HIGH (ref 6–20)
CO2: 24 mmol/L (ref 22–32)
Calcium: 7.1 mg/dL — ABNORMAL LOW (ref 8.9–10.3)
Chloride: 98 mmol/L (ref 98–111)
Creatinine, Ser: 3.13 mg/dL — ABNORMAL HIGH (ref 0.61–1.24)
GFR, Estimated: 23 mL/min — ABNORMAL LOW (ref >60)
Glucose, Bld: 111 mg/dL — ABNORMAL HIGH (ref 70–99)
Phosphorus: 7.5 mg/dL — ABNORMAL HIGH (ref 2.5–4.6)
Potassium: 3.2 mmol/L — ABNORMAL LOW (ref 3.5–5.1)
Sodium: 135 mmol/L (ref 135–145)

## 2020-05-05 LAB — ANTINUCLEAR ANTIBODIES, IFA: ANA Ab, IFA: NEGATIVE

## 2020-05-05 LAB — COMPLEMENT, TOTAL: Compl, Total (CH50): 48 U/mL (ref >41)

## 2020-05-05 LAB — C4 COMPLEMENT: Complement C4, Body Fluid: 12 mg/dL (ref 12–38)

## 2020-05-05 LAB — PROTIME-INR
INR: 4.4 (ref 0.8–1.2)
Prothrombin Time: 40.8 seconds — ABNORMAL HIGH (ref 11.4–15.2)

## 2020-05-05 LAB — ANTISTREPTOLYSIN O TITER: ASO: 49 IU/mL (ref 0.0–200.0)

## 2020-05-05 LAB — GLOMERULAR BASEMENT MEMBRANE ANTIBODIES: GBM Ab: 3 units (ref 0–20)

## 2020-05-05 LAB — ANTI-DNA ANTIBODY, DOUBLE-STRANDED: ds DNA Ab: <1 IU/mL (ref 0–9)

## 2020-05-05 LAB — MPO/PR-3 (ANCA) ANTIBODIES
ANCA Proteinase 3: 23 U/mL — ABNORMAL HIGH (ref 0.0–3.5)
Myeloperoxidase Abs: <9 U/mL (ref 0.0–9.0)

## 2020-05-05 LAB — C3 COMPLEMENT: C3 Complement: 62 mg/dL — ABNORMAL LOW (ref 82–167)

## 2020-05-05 MED ORDER — POTASSIUM CHLORIDE CRYS ER 20 MEQ PO TBCR
40.0000 meq | EXTENDED_RELEASE_TABLET | Freq: Once | ORAL | Status: AC
  Administered 2020-05-05: 40 meq via ORAL
  Filled 2020-05-05: qty 2

## 2020-05-05 NOTE — Telephone Encounter (Signed)
This pt was the one you brought the compression socks to in the ICU the other day. Please see below.

## 2020-05-05 NOTE — Progress Notes (Signed)
ANTICOAGULATION CONSULT NOTE  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   No Known Allergies  Patient Measurements: Height: 6\' 3"  (190.5 cm) Weight: (!) 136.9 kg (301 lb 13 oz) IBW/kg (Calculated) : 84.5  Vital Signs: Temp: 98.2 F (36.8 C) (12/02 1236) Temp Source: Oral (12/02 1236) BP: 137/84 (12/02 1236) Pulse Rate: 85 (12/02 1236)  Labs: Recent Labs    05/03/20 0424 05/03/20 0424 05/04/20 0331 05/04/20 2000 05/05/20 0313  HGB 10.1*   < > 9.4*  --  9.6*  HCT 31.1*  --  28.9*  --  28.8*  PLT 311  --  286  --  334  LABPROT 22.4*  --  42.2*  --  40.8*  INR 2.0*  --  4.6*  --  4.4*  CREATININE 2.92*   < > 3.07* 3.00* 3.13*   < > = values in this interval not displayed.    Estimated Creatinine Clearance: 40.3 mL/min (A) (by C-G formula based on SCr of 3.13 mg/dL (H)).   Medical History: Past Medical History:  Diagnosis Date  . Cancer ([place])   . Diabetes mellitus without complication ([place])   . High cholesterol   . Testicle cancer ([place])   . Thyroid disease   . 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 Hgb stable post-op, currently at 9.4, plt wnl. No bleeding noted.   INR with significant increase yesterday from 2 to 4.6, now down slightly to 4.4. LFTs improving, CBC stable.   Goal of Therapy:  INR goal 2.5-3.5 Monitor platelets by anticoagulation protocol: Yes   Plan:  Hold warfarin for now until INR trends back down Daily INR Warfarin education prior to discharge  Arrie Senate, PharmD, BCPS, [facility] Clinical Pharmacist [phone] Please check AMION for all [place] numbers 05/05/2020

## 2020-05-05 NOTE — Progress Notes (Signed)
5 Days Post-Op Procedure(s) (LRB): MITRAL VALVE (MV) REPLACEMENT USING ON-X MITRAL VALVE SIZE 31/33 MM,  PERMANENT EPICARDIAL PACEMAKING SYSTEM IMPLANT USING MEDTRONIC LEADS (N/A) TRANSESOPHAGEAL ECHOCARDIOGRAM (TEE) (N/A) Subjective: No complaints, feeling better  Objective: Vital signs in last 24 hours: Temp:  [97.7 F (36.5 C)-98.3 F (36.8 C)] 97.8 F (36.6 C) (12/02 0400) Pulse Rate:  [79-92] 87 (12/02 0700) Cardiac Rhythm: Atrial paced (12/01 1600) Resp:  [14-29] 19 (12/02 0700) BP: (108-149)/(77-125) 140/86 (12/02 0700) SpO2:  [90 %-96 %] 96 % (12/02 0700) Weight:  [136.9 kg] 136.9 kg (12/02 0500)  Hemodynamic parameters for last 24 hours:    Intake/Output from previous day: 12/01 0701 - 12/02 0700 In: 641 [P.O.:120; IV Piggyback:521] Out: 1771 [HAFBX:0383; Chest Tube:1060] Intake/Output this shift: No intake/output data recorded.  General appearance: alert and cooperative Neurologic: intact Heart: regular rate and rhythm, S1, S2 normal, no murmur, click, rub or gallop Lungs: clear to auscultation bilaterally Abdomen: soft, non-tender; bowel sounds normal; no masses,  no organomegaly Extremities: extremities normal, atraumatic, no cyanosis or edema Wound: c/d/i  Lab Results: Recent Labs    05/04/20 0331 05/05/20 0313  WBC 26.2* 23.5*  HGB 9.4* 9.6*  HCT 28.9* 28.8*  PLT 286 334   BMET:  Recent Labs    05/04/20 2000 05/05/20 0313  NA 135 135  K 3.3* 3.2*  CL 99 98  CO2 24 24  GLUCOSE 164* 111*  BUN 33* 35*  CREATININE 3.00* 3.13*  CALCIUM 7.0* 7.1*    PT/INR:  Recent Labs    05/05/20 0313  LABPROT 40.8*  INR 4.4*   ABG    Component Value Date/Time   PHART 7.419 04/25/2020 1823   HCO3 18.8 (L) 04/04/2020 1823   TCO2 20 (L) 04/09/2020 1823   ACIDBASEDEF 5.0 (H) 04/09/2020 1823   O2SAT 97.0 04/23/2020 1823   CBG (last 3)  Recent Labs    05/04/20 2332 05/05/20 0347 05/05/20 0658  GLUCAP 134* 94 67*    Assessment/Plan: S/P  Procedure(s) (LRB): MITRAL VALVE (MV) REPLACEMENT USING ON-X MITRAL VALVE SIZE 31/33 MM,  PERMANENT EPICARDIAL PACEMAKING SYSTEM IMPLANT USING MEDTRONIC LEADS (N/A) TRANSESOPHAGEAL ECHOCARDIOGRAM (TEE) (N/A) Mobilize Diuresis Plan for transfer to step-down: see transfer orders   LOS: 13 days    Wonda Olds 05/05/2020

## 2020-05-05 NOTE — Telephone Encounter (Signed)
IC ok to take off socks and try to reapply in the am

## 2020-05-05 NOTE — Progress Notes (Signed)
Jonathan Shaffer for Infectious Disease  Date of Admission:  04/14/2020      Total days of antibiotics 14  Nafcillin 11/20 >>             ASSESSMENT: Jonathan Shaffer is a 54 y.o. 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   POD 5 today.  Appears to be feeling better today with plans to transfer to SDU. Serum Cr seems to be plateauing off a bit, hopeful we will see a downward trend soon.  D/W Dr. Loletha Shaffer - ?AIN d/t Naf, but unclear what to make of + urine eosinophils in the absence of peripheral eosinophilia; could be explained by thromboembolic disease also.   Would like to continue Nafcillin at least through 2 weeks of negative blood cultures (Sunday 12/05) but preferably from the valve surgery (source control) >> would then switch to Cefazolin to complete course of treatment for endocarditis.  Will follow up day to day for tolerability. For CNS penetration other preferred option is Vancomycin, which is not ideal Shaffer AKI.   LFTs continue to come down, follow for timing to add Rifampin - hopefully early next week. D/W pharmacy team regarding plan to add in future (he is on warfarin presently).   Dr. Sharol Shaffer continues to follow closely for foot wounds - ischemic blisters w/o purulence; dusky appearing tissue with prolonged capillary refill noted. Likely will need partial amputation in the future. Plans to hold off until better microcirculation/decongestion to heal from surgery.   EP following - continues to have improved intrinsic rhythm in 80s on telemetry. Watchful waiting for now. He has permanent epicardial leads in place if deemed necessary for generator (extravascular device).     PLAN: 1. Continue nafcillin  2. Holding on PPM generator for now (epicardial system) 3. Follow progression of feet, fever and WBC curve 4. Hold on addition of gent / rifampin   5. Check LFTs again in 48h   Principal Problem:   Abscess of right shoulder Active Problems:   Severe sepsis (HCC)   Splenic infarct   Diabetic acidosis without coma (HCC)   AKI (acute kidney injury) ([place])   Endocarditis of mitral valve   Bacteremia   S/P MVR (mitral valve repair)   Embolic disease of toe (HCC)   Venous insufficiency (chronic) (peripheral)   History of open heart surgery   Foot lesion   . sodium chloride   Intravenous Once  . sodium chloride   Intravenous Once  . acetaminophen  500 mg Oral Q8H  . aspirin EC  81 mg Oral Daily  . atorvastatin  40 mg Oral Daily  . bisacodyl  10 mg Oral Daily   Or  . bisacodyl  10 mg Rectal Daily  . Chlorhexidine Gluconate Cloth  6 each Topical Daily  . colchicine  0.6 mg Oral BID  . docusate sodium  200 mg Oral Daily  . feeding supplement  237 mL Oral BID BM  . insulin aspart  2-6 Units Subcutaneous Q4H  . insulin detemir  8 Units Subcutaneous Q12H  . lactulose  30 g Oral TID  . lidocaine  1 patch Transdermal Daily  . loratadine  10 mg Oral Daily  . mouth rinse  15 mL Mouth Rinse BID  . multivitamin with minerals  1 tablet Oral Daily  . mupirocin ointment  1 application Nasal BID  . pantoprazole  40 mg  Oral Daily  . sodium chloride flush  10-40 mL Intracatheter Q12H  . sodium chloride flush  3 mL Intravenous Q12H  . Warfarin - Pharmacist Dosing Inpatient   Does not apply q1600    SUBJECTIVE: Feels better today compared to yesterday. Orders in for transfer to stepdown unit.  No new concerns today.  Rash is slightly improved    Review of Systems: Review of Systems  Constitutional: Negative for chills, fever, malaise/fatigue and weight loss.  HENT: Negative for sore throat.   Respiratory: Negative for cough, sputum production and shortness of breath.   Cardiovascular: Positive for chest pain (present with coughing or movement).  Gastrointestinal: Negative for abdominal pain, diarrhea and vomiting.  Genitourinary:  Negative for dysuria.  Musculoskeletal: Negative for myalgias and neck pain.  Skin: Positive for rash. Negative for itching.  Neurological: Negative for headaches.  Psychiatric/Behavioral: Negative for depression and substance abuse. The patient is not nervous/anxious.     No Known Allergies  OBJECTIVE: Vitals:   05/05/20 0700 05/05/20 0800 05/05/20 0900 05/05/20 1000  BP: 140/86 (!) 144/95 (!) 152/93 (!) 146/87  Pulse: 87 84 84 86  Resp: 19 (!) 22 20 (!) 23  Temp:      TempSrc:      SpO2: 96% 95% 95% 94%  Weight:      Height:       Body mass index is 37.72 kg/m.  Physical Exam Constitutional:      Appearance: He is well-developed.     Comments: Resting comfortably in bed.   HENT:     Mouth/Throat:     Dentition: Normal dentition. No dental abscesses.     Pharynx: No oropharyngeal exudate.  Eyes:     General: No scleral icterus. Cardiovascular:     Rate and Rhythm: Normal rate and regular rhythm.     Heart sounds: Normal heart sounds. No murmur heard.   Pulmonary:     Effort: Pulmonary effort is normal.     Breath sounds: Normal breath sounds.  Chest:     Comments: Midsternal incision dressed and clean.  Abdominal:     General: Bowel sounds are normal. There is no distension.     Palpations: Abdomen is soft.     Tenderness: There is no abdominal tenderness.  Musculoskeletal:     Comments: Feet wrapped in compression wraps - Dr. Jess Shaffer note reviewed   Lymphadenopathy:     Cervical: No cervical adenopathy.  Skin:    General: Skin is warm and dry.     Findings: No rash.     Comments: Blanchable pink/red rash noted on patients upper back and axilla extending down lateral abdomen. Not raised appearing.   Neurological:     Mental Status: He is alert and oriented to person, place, and time.  Psychiatric:        Judgment: Judgment normal.     Lab Results Lab Results  Component Value Date   WBC 23.5 (H) 05/05/2020   HGB 9.6 (L) 05/05/2020   HCT 28.8 (L)  05/05/2020   MCV 90.9 05/05/2020   PLT 334 05/05/2020    Lab Results  Component Value Date   CREATININE 3.13 (H) 05/05/2020   BUN 35 (H) 05/05/2020   NA 135 05/05/2020   K 3.2 (L) 05/05/2020   CL 98 05/05/2020   CO2 24 05/05/2020    Lab Results  Component Value Date   ALT 343 (H) 05/05/2020   AST 198 (H) 05/05/2020   ALKPHOS 327 (H) 05/05/2020  BILITOT 3.2 (H) 05/05/2020     Microbiology: Recent Results (from the past 240 hour(s))  Aerobic/Anaerobic Culture (surgical/deep wound)     Status: None   Collection Time: 04/22/2020  2:48 PM   Specimen: PATH Other; Tissue  Result Value Ref Range Status   Specimen Description   Final    ABSCESS SHOULDER RIGHT Performed at [facility] [address] [place], [place] [address]    Special Requests   Final    NONE Performed at [facility], [place] [address] [place], [place] [address]    Gram Stain   Final    FEW WBC PRESENT, PREDOMINANTLY PMN ABUNDANT GRAM POSITIVE COCCI    Culture   Final    MODERATE STAPHYLOCOCCUS AUREUS NO ANAEROBES ISOLATED Performed at [facility], [place] [address], [place], [place] [address]    Report Status 05/01/2020 FINAL  Final   Organism ID, Bacteria STAPHYLOCOCCUS AUREUS  Final      Susceptibility   Staphylococcus aureus - MIC*    CIPROFLOXACIN <=0.5 SENSITIVE Sensitive     ERYTHROMYCIN <=0.25 SENSITIVE Sensitive     GENTAMICIN <=0.5 SENSITIVE Sensitive     OXACILLIN 0.5 SENSITIVE Sensitive     TETRACYCLINE <=1 SENSITIVE Sensitive     VANCOMYCIN 1 SENSITIVE Sensitive     TRIMETH/SULFA <=10 SENSITIVE Sensitive     CLINDAMYCIN <=0.25 SENSITIVE Sensitive     RIFAMPIN <=0.5 SENSITIVE Sensitive     Inducible Clindamycin NEGATIVE Sensitive     * MODERATE STAPHYLOCOCCUS AUREUS  Aerobic/Anaerobic Culture (surgical/deep wound)     Status: None   Collection Time: 04/26/2020  2:58 PM   Specimen: PATH Other; Tissue  Result Value Ref Range Status   Specimen  Description   Final    TISSUE SHOULDER RIGHT Performed at [facility] [address] [place], [place] [address]    Special Requests   Final    NONE Performed at [facility], [place] [address], [place], [place] [address]    Gram Stain   Final    ABUNDANT WBC PRESENT, PREDOMINANTLY PMN ABUNDANT GRAM POSITIVE COCCI    Culture   Final    ABUNDANT STAPHYLOCOCCUS AUREUS NO ANAEROBES ISOLATED Performed at [facility], [place] [address], [place], [place] [address]    Report Status 05/01/2020 FINAL  Final   Organism ID, Bacteria STAPHYLOCOCCUS AUREUS  Final      Susceptibility   Staphylococcus aureus - MIC*    CIPROFLOXACIN <=0.5 SENSITIVE Sensitive     ERYTHROMYCIN <=0.25 SENSITIVE Sensitive     GENTAMICIN <=0.5 SENSITIVE Sensitive     OXACILLIN 0.5 SENSITIVE Sensitive     TETRACYCLINE <=1 SENSITIVE Sensitive     VANCOMYCIN <=0.5 SENSITIVE Sensitive     TRIMETH/SULFA <=10 SENSITIVE Sensitive     CLINDAMYCIN <=0.25 SENSITIVE Sensitive     RIFAMPIN <=0.5 SENSITIVE Sensitive     Inducible Clindamycin NEGATIVE Sensitive     * ABUNDANT STAPHYLOCOCCUS AUREUS  Culture, blood (Routine X 2) w Reflex to ID Panel     Status: None   Collection Time: 04/26/20  9:58 AM   Specimen: BLOOD RIGHT HAND  Result Value Ref Range Status   Specimen Description   Final    BLOOD RIGHT HAND Performed at [facility] [address] [place], [place] [address]    Special Requests   Final    BOTTLES DRAWN AEROBIC ONLY Blood Culture results may not be optimal due to  an inadequate volume of blood received in culture bottles Performed at [facility] [address], [place], [place] [address]    Culture   Final    NO GROWTH 5 DAYS Performed at [facility], [place] [address], [place], [place] [address]    Report Status 05/01/2020 FINAL  Final  Culture, blood (Routine X 2) w Reflex to ID Panel     Status: None   Collection  Time: 04/26/20  9:58 AM   Specimen: BLOOD RIGHT HAND  Result Value Ref Range Status   Specimen Description   Final    BLOOD RIGHT HAND Performed at [facility] [address] [place], [place] [address]    Special Requests   Final    BOTTLES DRAWN AEROBIC ONLY Blood Culture results may not be optimal due to an inadequate volume of blood received in culture bottles Performed at [facility] [address] [place], [place] [address]    Culture   Final    NO GROWTH 5 DAYS Performed at [facility], [place] [address] [place], [place] [address]    Report Status 05/01/2020 FINAL  Final  Surgical pcr screen     Status: Abnormal   Collection Time: 04/23/2020  2:39 AM   Specimen: Nasal Mucosa; Nasal Swab  Result Value Ref Range Status   MRSA, PCR NEGATIVE NEGATIVE Final   Staphylococcus aureus POSITIVE (A) NEGATIVE Final    Comment: (NOTE) The Xpert SA Assay (FDA approved for NASAL specimens in patients 23 years of age and older), is one component of a comprehensive surveillance program. It is not intended to diagnose infection nor to guide or monitor treatment. Performed at [facility], [employer] [address], [place], [place] [address]   Aerobic/Anaerobic Culture (surgical/deep wound)     Status: None (Preliminary result)   Collection Time: 04/25/2020 11:01 AM   Specimen: Other Source  Result Value Ref Range Status   Specimen Description TISSUE  Final   Special Requests   Final    MITRAL VALVE VEGETATION SPEC A ANTIBIOTICS ZINACEF VANCOMYCIN   Gram Stain   Final    ABUNDANT WBC PRESENT, PREDOMINANTLY PMN NO ORGANISMS SEEN Performed at [facility], [place] [address] [place], [place] [address]    Culture   Final    RARE STAPHYLOCOCCUS AUREUS CRITICAL RESULT CALLED TO, READ BACK BY AND VERIFIED WITH: RN Mellissa Kohut [id number] AT 1212 BY CM NO ANAEROBES ISOLATED; CULTURE IN PROGRESS FOR 5 DAYS    Report Status PENDING  Incomplete   Organism  ID, Bacteria STAPHYLOCOCCUS AUREUS  Final      Susceptibility   Staphylococcus aureus - MIC*    CIPROFLOXACIN <=0.5 SENSITIVE Sensitive     ERYTHROMYCIN <=0.25 SENSITIVE Sensitive     GENTAMICIN <=0.5 SENSITIVE Sensitive     OXACILLIN 0.5 SENSITIVE Sensitive     TETRACYCLINE <=1 SENSITIVE Sensitive     VANCOMYCIN 1 SENSITIVE Sensitive     TRIMETH/SULFA <=10 SENSITIVE Sensitive     CLINDAMYCIN <=0.25 SENSITIVE Sensitive     RIFAMPIN <=0.5 SENSITIVE Sensitive     Inducible Clindamycin NEGATIVE Sensitive     * RARE STAPHYLOCOCCUS AUREUS     Janene Madeira, MSN, NP-C [facility] for Infectious Disease [email or web address] Pager: [phone] Office: [place]: [phone]

## 2020-05-05 NOTE — Plan of Care (Signed)
  Problem: Clinical Measurements: Goal: Ability to maintain clinical measurements within normal limits will improve Outcome: Progressing Goal: Respiratory complications will improve Outcome: Progressing   Problem: Nutrition: Goal: Adequate nutrition will be maintained Outcome: Progressing   

## 2020-05-05 NOTE — Progress Notes (Signed)
Patient ID: Jonathan Shaffer, male   DOB: 1965/10/11, 54 y.o.   MRN: [id number] Patient is status post debridement of the left first metatarsal head ulcer yesterday.  The tissue is ischemic.  Patient has the compression socks in place.  Discussed that we will try to postpone surgical intervention for the left great toe as long as possible to give his body time to improve the microcirculation in the zone of injury.  Discussed that if he has any systemic symptoms or increasing white blood cell count we would need to proceed with a first ray amputation sooner rather than later.  I am concerned with the capillary refill greater than 10 seconds in the forefoot that foot salvage intervention surgery at this time would not be successful.

## 2020-05-05 NOTE — Progress Notes (Signed)
CRITICAL VALUE ALERT  Critical Value:  INR 4.4  Date & Time Notied:  05/05/20 0445  Provider Notified: Arelia Sneddon with [facility]  Orders Received/Actions taken: Awaiting orders.

## 2020-05-05 NOTE — Progress Notes (Signed)
NAME:  Jonathan Shaffer, MRN:  [id number], DOB:  [birth year], LOS: 29 ADMISSION DATE:  04/15/2020, CONSULTATION DATE:  04/23/20 REFERRING MD:  Karleen Hampshire, CHIEF COMPLAINT:  Septic shock  Brief History   MSSA bacteremia +/- AC septic arthritis, septic emboli to spleen  History of present illness   Jonathan Shaffer is a 54 y/o gentleman admitted with sepsis, found to have MSSA bacteremia. He developed sudden onset nausea and vomiting and chills last week and later developed fevers and R shoulder pain. He was evaluated and felt to have a sprain. He was treated with an OP steroid injection and started oral steroids. He developed a painful rash on his hand. His progressive malaise, fevers, rash, and joint pain prompted return to the ED. At presentation he was febrile, tachycardic, tachypneic, with lactic acidosis. He was in DKA at presentation. He has a previous history of "high diabetes markers", which he reports he was not following up on. He was started on empiric vanc, zosyn & cefepime, and doxycycline. He has been evaluated by orthopaedics for possible septic arthritis of his R AC joint. ID has been consulted for MSSA+ blood cultures. No known history of IVDU or injuries prior to becoming ill. He has been encephalopathic this admission, prompting concern for septic emboli to the brain; MRI pending.  Past Medical History  Uncontrolled DM Testicular cancer; in remission [facility] Events   Started vasopressors 11/20 MV replacement 11/27  Consults:  ID Ortho PCCM TCTS Nephrology  Procedures:  PICC  Mechanical MV replacement 11/27  Significant Diagnostic Tests:  MRI R shoulder> septic arthritis AC joint, possible intramuscular abscess, OM distal clavicle Echocardiogram 11/19> LVEF 55 to 60%, normal valves. LUE US> no DVT MRI left wrist> cellulitis, no septic arthritis  Micro Data:  Blood culture 11/19> MSSA  2/3 Urine culture 11/19> staph aureus Blood culture 11/21> NG Shoulder  culture 11/22> abundant MSSA Blood cultuers 11/23> NG Operative cultures 11/22> abundant WBC> rare MSSA  Antimicrobials:  Vanc 11/20 Zosyn 11/19 Cefepime 11/19- 11/20 Doxycycline 11/19 Nafcillin 11/20>  Interim history/subjective:  Jonathan Shaffer is feeling more awake today. He still has some back pain and muscle aches. No new issues.  Objective   Blood pressure 140/86, pulse 87, temperature 97.8 F (36.6 C), temperature source Oral, resp. rate 19, height 6\' 3"  (1.905 m), weight (!) 136.9 kg, SpO2 96 %.        Intake/Output Summary (Last 24 hours) at 05/05/2020 0906 Last data filed at 05/05/2020 0700 Gross per 24 hour  Intake 599.35 ml  Output 5395 ml  Net -4795.65 ml   Filed Weights   05/03/20 0500 05/04/20 0600 05/05/20 0500  Weight: (!) 140.1 kg (!) 140 kg (!) 136.9 kg    Examination: General: middle aged man laying in recliner in NAD, more awake today HENT: [place]/AT, eyes anicteric Lungs: breathing comfortably on RA, decreased basilar breath sounds Cardiovascular:  RRR, mechanical click over precordium. Abdomen: obese, soft, NT Extremities: Compression socks and ortho shoes in place, foot wounds not examined.  Derm: Pallor slightly improved, drug rash unchanged.  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  -Appreciate ID's assistance. Planning to continue nafcillin to complete 2 weeks, then likely will change to cephalosporin to complete 6 week course for endocarditis. -Appreciate cardiothoracic surgery's management  --Appreciate Ortho's assistance -Pharmacy  consult for Coumadin  Acute respiratory failure post-operatively due to acute pulmonary edema, atelectasis, bilateral effusion- resolved  -Chest tube management per primary -out of bed mobility, incentive  spirometer -Titrate down FiO2 as able to be maintain SPO2 greater than 90%  Drug rash -Transitioned morphine to Dilaudid --deescalate nafcillin after 2 weeks  AKI -worsening.  FeNa 0.8 consistent with prerenal etiology.  Likely diuresed through chest tube and intravascularly depleted. -Renally dose medications, avoid nephrotoxic meds -Nephrology consult -- appreciate their recommendaitons -Maintain MAP  >65 for adequate renal perfusion. Holding additional diuresis. -Strict I/Os -Renal artery ultrasound with normal perfusion  Sinus node dysfunction-known atrial rhythm -appreciate EP's assistance -Continue telemetry monitoring  DM with hyperglycemia- controlled; prev in DKA. A1c 11.3 -Insulin detemir 8 units twice daily plus siding scale insulin as needed -goal BG 140-180 while critically ill -needs OP DM education and improved control  Acute anemia; suspect this is mixed due to critical illness and operative blood loss- stable --con't to monitor -Transfuse hemoglobin less than 7 or hemodynamically significant bleeding  Hyperbilirubinemia.  Likely due to hepatic congestion. Elevated transaminases likely 2/2 sepsis and heart failure-likely due to critical illness; unclear trending up or down since recent surgery -Continue to monitor daily  Delirium- multifactorial due to sepsis, sleep disruption, ICU delirium -Out of bed mobility as able; PT consult -Adequate pain control.  Con't lidocaine patch for back pain. -Continue Seroquel for insomnia   Acute HFrEF Acute pulmonary edema Sinus nod dysfunction, no longer pacemaker dependent -EP consult -appreciate their recommendations. Hopefully will not require a pacemaker. -con't diuresis -Due to AKI no ACE inhibitor, ARB, Entresto at this time. Defer use of a beta blocker to cardiology.  Poor PO intake, moderate malnutrition with low prealbumin -RD consult- appreciate their recommendations  Back pain, most likely MSK related due to  immobility -scheduled tylenol Q8h since LFTs improving -lidocaine patch to back  Foot ischemia from septic emboli -Appreciate respiratory surgery's assistance.   Agree with transfer to progressive care unit. PCCM will sign off.    Best practice:  Per primary  Code Status: full Family Communication: wife updated at bedside 12/2 Disposition: progressive  Labs   CBC: Recent Labs  Lab 04/19/2020 0258 04/11/2020 0918 05/01/20 1431 05/02/20 0407 05/03/20 0424 05/04/20 0331 05/05/20 0313  WBC 19.2*   < > 19.9* 26.2* 30.3* 26.2* 23.5*  NEUTROABS 17.5*  --   --  22.9*  --   --  20.3*  HGB 10.3*   < > 8.9* 9.7* 10.1* 9.4* 9.6*  HCT 33.0*   < > 27.0* 29.1* 31.1* 28.9* 28.8*  MCV 94.6   < > 90.9 91.5 92.6 92.3 90.9  PLT 640*   < > 242 279 311 286 334   < > = values in this interval not displayed.    Basic Metabolic Panel: Recent Labs  Lab 05/01/2020 2255 04/07/2020 2255 05/01/20 0350 05/01/20 1431 05/01/20 1741 05/02/20 0407 05/03/20 0424 05/04/20 0331 05/04/20 2000 05/05/20 0313  NA 138   < > 137  --    < > 135 135 136 135 135  K 5.0   < > 4.9  --    < > 4.5 4.4 3.8 3.3* 3.2*  CL 104   < > 102  --    < > 101 98 100 99 98  CO2 20*   < > 21*  --    < > 22 22 23 24 24   GLUCOSE 134*   < > 137*  --    < >  128* 144* 143* 164* 111*  BUN 14   < > 15  --    < > 25* 32* 34* 33* 35*  CREATININE 1.67*   < > 1.79*  --    < > 2.46* 2.92* 3.07* 3.00* 3.13*  CALCIUM 7.3*   < > 7.1*  --    < > 7.3* 6.9* 6.9* 7.0* 7.1*  MG 2.9*  --  2.8* 2.6*  --  2.8*  --   --   --   --   PHOS  --   --   --   --   --   --   --   --   --  7.5*   < > = values in this interval not displayed.   GFR: Estimated Creatinine Clearance: 40.3 mL/min (A) (by C-G formula based on SCr of 3.13 mg/dL (H)). Recent Labs  Lab 05/02/20 0407 05/03/20 0424 05/04/20 0331 05/05/20 0313  WBC 26.2* 30.3* 26.2* 23.5*    Liver Function Tests: Recent Labs  Lab 04/11/2020 2322 05/01/20 1741 05/03/20 0424 05/04/20 0331  05/05/20 0313  AST 22 3,257* 1,021* 480* 198*  ALT 14 984* 686* 517* 343*  ALKPHOS 85 123 372* 348* 327*  BILITOT 1.4* 2.6* 3.7* 3.1* 3.2*  PROT 5.7* 5.4* 5.6* 5.9* 6.3*  ALBUMIN 1.3* 1.8* 1.4* 1.7* 1.5*  1.5*   No results for input(s): LIPASE, AMYLASE in the last 168 hours. Recent Labs  Lab 05/03/20 0824  AMMONIA 43*    ABG    Component Value Date/Time   PHART 7.419 05/01/2020 1823   PCO2ART 29.2 (L) 04/12/2020 1823   PO2ART 94 04/12/2020 1823   HCO3 18.8 (L) 04/29/2020 1823   TCO2 20 (L) 04/11/2020 1823   ACIDBASEDEF 5.0 (H) 04/10/2020 1823   O2SAT 97.0 04/04/2020 1823     Coagulation Profile: Recent Labs  Lab 04/13/2020 0258 04/15/2020 1541 05/03/20 0424 05/04/20 0331 05/05/20 0313  INR 1.2 1.7* 2.0* 4.6* 4.4*    Cardiac Enzymes: No results for input(s): CKTOTAL, CKMB, CKMBINDEX, TROPONINI in the last 168 hours.  HbA1C: Hgb A1c MFr Bld  Date/Time Value Ref Range Status  04/20/2020 02:58 AM 10.8 (H) 4.8 - 5.6 % Final    Comment:    (NOTE) Pre diabetes:          5.7%-6.4%  Diabetes:              >6.4%  Glycemic control for   <7.0% adults with diabetes   05/03/2020 10:17 AM 11.3 (H) 4.8 - 5.6 % Final    Comment:    (NOTE) Pre diabetes:          5.7%-6.4%  Diabetes:              >6.4%  Glycemic control for   <7.0% adults with diabetes     CBG: Recent Labs  Lab 05/04/20 1537 05/04/20 2027 05/04/20 2332 05/05/20 0347 05/05/20 0658  GLUCAP 159* 138* 134* 94 67*      Julian Hy, DO 05/05/20 9:06 AM [facility]

## 2020-05-05 NOTE — Progress Notes (Addendum)
Progress Note  Patient Name: Jonathan Shaffer Date of Encounter: 05/05/2020  [facility] Cardiologist: Candee Furbish, MD   Subjective   Feels a little better today, less sleepy  Inpatient Medications    Scheduled Meds:  sodium chloride   Intravenous Once   sodium chloride   Intravenous Once   acetaminophen  500 mg Oral Q8H   aspirin EC  81 mg Oral Daily   atorvastatin  40 mg Oral Daily   bisacodyl  10 mg Oral Daily   Or   bisacodyl  10 mg Rectal Daily   Chlorhexidine Gluconate Cloth  6 each Topical Daily   colchicine  0.6 mg Oral BID   docusate sodium  200 mg Oral Daily   feeding supplement  237 mL Oral BID BM   insulin aspart  2-6 Units Subcutaneous Q4H   insulin detemir  8 Units Subcutaneous Q12H   lactulose  30 g Oral TID   lidocaine  1 patch Transdermal Daily   loratadine  10 mg Oral Daily   mouth rinse  15 mL Mouth Rinse BID   multivitamin with minerals  1 tablet Oral Daily   mupirocin ointment  1 application Nasal BID   pantoprazole  40 mg Oral Daily   sodium chloride flush  10-40 mL Intracatheter Q12H   sodium chloride flush  3 mL Intravenous Q12H   Warfarin - Pharmacist Dosing Inpatient   Does not apply q1600   Continuous Infusions:  sodium chloride Stopped (05/01/20 1540)   sodium chloride     sodium chloride     lactated ringers     lactated ringers Stopped (04/04/2020 1638)   lactated ringers Stopped (05/02/20 1528)   nafcillin (NAFCIL) continuous infusion 20.8 mL/hr at 05/05/20 0900   PRN Meds: sodium chloride, dextrose, HYDROmorphone (DILAUDID) injection, lactated ringers, lip balm, metoprolol tartrate, ondansetron (ZOFRAN) IV, oxyCODONE, QUEtiapine, sodium chloride flush, sodium chloride flush, traMADol   Vital Signs    Vitals:   05/05/20 0700 05/05/20 0800 05/05/20 0900 05/05/20 1000  BP: 140/86 (!) 144/95 (!) 152/93 (!) 146/87  Pulse: 87 84 84 86  Resp: 19 (!) 22 20 (!) 23  Temp:      TempSrc:      SpO2: 96% 95% 95% 94%  Weight:        Height:        Intake/Output Summary (Last 24 hours) at 05/05/2020 1056 Last data filed at 05/05/2020 0900 Gross per 24 hour  Intake 598.99 ml  Output 4545 ml  Net -3946.01 ml   Last 3 Weights 05/05/2020 05/04/2020 05/03/2020  Weight (lbs) 301 lb 13 oz 308 lb 10.3 oz 308 lb 12.8 oz  Weight (kg) 136.9 kg 140 kg 140.071 kg      Telemetry    HR 80's, low atrial/junctional  - Personally Reviewed  ECG    No new EKGs - Personally Reviewed  Physical Exam   GEN: No acute distress, ill appearing, though looks better overall   Neck: No JVD Cardiac: RRR, no murmurs, rubs, or gallops.  Respiratory: slightly diminished at the bases. GI: Soft, nontender MS: 1+ edema, less; No deformity. Neuro:  Nonfocal  Psych: Normal affect   Labs    High Sensitivity Troponin:  No results for input(s): TROPONINIHS in the last 720 hours.    Chemistry Recent Labs  Lab 05/03/20 0424 05/03/20 0424 05/04/20 0331 05/04/20 2000 05/05/20 0313  NA 135   < > 136 135 135  K 4.4   < >  3.8 3.3* 3.2*  CL 98   < > 100 99 98  CO2 22   < > 23 24 24   GLUCOSE 144*   < > 143* 164* 111*  BUN 32*   < > 34* 33* 35*  CREATININE 2.92*   < > 3.07* 3.00* 3.13*  CALCIUM 6.9*   < > 6.9* 7.0* 7.1*  PROT 5.6*  --  5.9*  --  6.3*  ALBUMIN 1.4*  --  1.7*  --  1.5*  1.5*  AST 1,021*  --  480*  --  198*  ALT 686*  --  517*  --  343*  ALKPHOS 372*  --  348*  --  327*  BILITOT 3.7*  --  3.1*  --  3.2*  GFRNONAA 25*   < > 23* 24* 23*  ANIONGAP 15   < > 13 12 13    < > = values in this interval not displayed.     Hematology Recent Labs  Lab 05/03/20 0424 05/04/20 0331 05/05/20 0313  WBC 30.3* 26.2* 23.5*  RBC 3.36* 3.13* 3.17*  HGB 10.1* 9.4* 9.6*  HCT 31.1* 28.9* 28.8*  MCV 92.6 92.3 90.9  MCH 30.1 30.0 30.3  MCHC 32.5 32.5 33.3  RDW 16.3* 16.8* 17.1*  PLT 311 286 334    BNPNo results for input(s): BNP, PROBNP in the last 168 hours.   DDimer No results for input(s): DDIMER in the last 168 hours.    Radiology    DG Chest 1 View Result Date: 05/04/2020 CLINICAL DATA:  Post MVR, chest tube EXAM: CHEST  1 VIEW COMPARISON:  Portable exam 0553 hours compared to 05/03/2020 FINDINGS: LEFT jugular central venous catheter unchanged. BILATERAL thoracostomy tubes unchanged. Enlargement of cardiac silhouette post median sternotomy and MVR. Pacing leads project over chest. Bibasilar atelectasis greater on LEFT with small LEFT pleural effusion. Upper lungs clear. No pneumothorax. IMPRESSION: Bibasilar atelectasis LEFT greater than RIGHT. Small LEFT pleural effusion. Electronically Signed   By: Lavonia Dana M.D.   On: 05/04/2020 08:18    Result Date: 05/04/2020 CLINICAL DATA:  Occluded EXAM: ABDOMEN - 1 VIEW COMPARISON:  CT abdomen and pelvis April 22, 2020 FINDINGS: Loops of mildly dilated bowel throughout the abdomen persist without appreciable air-fluid levels. No free air evident on supine imaging. Multiple surgical clips noted throughout the abdomen and upper pelvis. IMPRESSION: Suspect postoperative ileus, although a degree of partial bowel obstruction cannot be excluded in this circumstance. No free air appreciable on supine examination. Multiple surgical clips present. Electronically Signed   By: Lowella Grip III M.D.   On: 05/04/2020 08:52    [place] LT LOWER EXTREM LTD SOFT TISSUE NON VASCULAR Result Date: 05/03/2020 CLINICAL DATA:  Follow-up left great toe lesion EXAM: ULTRASOUND  LEFT FIRST TOE TECHNIQUE: Ultrasound examination of the lower extremity soft tissues was performed in the area of clinical concern. COMPARISON:  Ultrasound April 24, 2020. FINDINGS: Decreased size of the complex fluid collection along the medial aspect of left great toe which now measures 2.2 x 2.2 x 1.3 cm previously 2.4 x 2.3 x 1.5 cm. IMPRESSION: Interval decrease in size of the phlegmonous collection/developing abscess along the medial aspect of the left great toe. Electronically Signed   By: Dahlia Bailiff MD    On: 05/03/2020 12:47    VAS US RENAL ARTERY DUPLEX Result Date: 05/03/2020 ABDOMINAL VISCERAL Indications: Endocarditis, acute kidney injury concern for embolization to renal  artery. High Risk Factors: Hypertension, hyperlipidemia, Diabetes, coronary artery                    disease. Other Factors: Status post MVR on 04/25/2020. Limitations: Air/bowel gas and obesity. Performing Technologist: Oda Cogan RDMS, RVT  Examination Guidelines: A complete evaluation includes B-mode imaging, spectral Doppler, color Doppler, and power Doppler as needed of all accessible portions of each vessel. Bilateral testing is considered an integral part of a complete examination. Limited examinations for reoccurring indications may be performed as noted.   Summary: Renal:  Right: Patent renal artery. Left:  Patent renal artery.  *See table(s) above for measurements and observations.  Diagnosing physician: Deitra Mayo MD  Electronically signed by Deitra Mayo MD on 05/03/2020 at 7:56:09 PM.    Final     Cardiac Studies   CARDIAC CATH: 04/12/2020 Prox Cx lesion is 25% stenosed. Mid LAD lesion is 10% stenosed. LV end diastolic pressure is moderately elevated. LVEDP 29 mm Hg. There is no aortic valve stenosis. Hemodynamic findings consistent with mild pulmonary hypertension. Ao sat 98%, PA sat 68%, PA pressure 41/20, mean PA 31 mm Hg; mean PCWP 28 mm Hg; CO 8.1 L/min; CI 3.2   Nonobstructive CAD.    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    Continue with plans for mitral valve intervention with Dr. Orvan Seen.      ECHO: 04/17/2020  1. Left ventricular ejection fraction, by estimation, is 55 to 60%. The  left ventricle has normal function. The left ventricle has no regional  wall motion abnormalities. Left ventricular diastolic function could not  be evaluated.   2. The mitral valve is grossly normal. No evidence of mitral valve  regurgitation. No evidence of mitral stenosis.    3. The aortic valve is tricuspid. Aortic valve regurgitation is not  visualized. No aortic stenosis is present.    TEE: 04/08/2020  1. MITRAL VALVE: There is a large echodensity that is partially fixed (below the valve on the ventricular side and possibly representing an abscess) and  partially hypermobile, multilobar ( on the left atrial side) consistent  with a vegetation with high risk for embolization. This involves P2,3 and A3 leaflets. Vegetation size is 4.4 x 2.4 cm.   2. Left ventricular ejection fraction, by estimation, is 55 to 60%. The  left ventricle has normal function. The left ventricle has no regional  wall motion abnormalities. Left ventricular diastolic function could not  be evaluated.   3. Right ventricular systolic function is normal. The right ventricular  size is normal.   4. No left atrial/left atrial appendage thrombus was detected.   5. NO, INCORRECT The mitral valve is normal in structure. Mild mitral valve  regurgitation. No evidence of mitral stenosis.   6. The aortic valve is normal in structure. Aortic valve regurgitation is  not visualized. No aortic stenosis is present.   7. The inferior vena cava is normal in size with greater than 50%  respiratory variability, suggesting right atrial pressure of 3 mmHg.   Conclusion(s)/Recommendation(s): Normal biventricular function without  evidence of hemodynamically significant valvular heart disease. CT surgery  and hospitalist Dr Avon Gully were notified at 1:10 pm.The patient will be  transferred [facility] the [facility].   Patient Profile     54 y.o. male cancer, admitted with new DM/DKA, and disseminated MSSA bacteremia/endocarditis  S/p  04/18/2020 Mitral valve replacement with 33 mm On-X mechanical prosthesis Implantation of permanent epicardial pacemaking system with leads on the right  atrium and right ventricle  Assessment & Plan    1. Post-op junctional rhythm/sinus node dysfunction     Metoprolol last dose was  05/02/2020 at 1000     He has permanent epicardial wires in place      Has temp pacing support via temp epicardial wires  Tele notes HR remain in the 80's Note as well atrial undersensing and inappropriate pacing >> has sine been turned off  I think he may not require generator placement. Dr. Lovena Le has seen patient No pacing needs at this time   Management of his numerous other issues as per  CCM CTS nephrology ID  Disseminated MSSA bacteremia LFTs continue to be improving AKI, Creat rising slightly again Edema, volume OL (good urine OP)     EP service will sign off though remain available, please recall if needed     For questions or updates, please contact [facility] Please consult [email or web address] for contact info under        Signed, Baldwin [place], PA-C  05/05/2020, 10:56 AM    EP Attending  Discussed above with Tommye Standard, PA-C. Agree with documentation. No indication for PPM at this time based on review of tele.  Salome Spotted.

## 2020-05-05 NOTE — Progress Notes (Signed)
eLink Physician-Brief Progress Note Patient Name: BREYER TEJERA DOB: Feb 18, 1966 MRN: [id number]   Date of Service  05/05/2020  HPI/Events of Note  Hypokalemia - K+ = 3.2 and Creatinine increasing = 3.0 --> 3.13.  eICU Interventions  Defer K+ replacement to Nephrology.     Intervention Category Major Interventions: Electrolyte abnormality - evaluation and management  Kalan Rinn Eugene 05/05/2020, 4:48 AM

## 2020-05-05 NOTE — Anesthesia Postprocedure Evaluation (Signed)
Anesthesia Post Note  Patient: Jonathan Shaffer  Procedure(s) Performed: MITRAL VALVE (MV) REPLACEMENT USING ON-X MITRAL VALVE SIZE 31/33 MM,  PERMANENT EPICARDIAL PACEMAKING SYSTEM IMPLANT USING MEDTRONIC LEADS (N/A Chest) TRANSESOPHAGEAL ECHOCARDIOGRAM (TEE) (N/A Chest)     Patient location during evaluation: SICU Anesthesia Type: General Level of consciousness: sedated Pain management: pain level controlled Vital Signs Assessment: post-procedure vital signs reviewed and stable Respiratory status: patient remains intubated per anesthesia plan Cardiovascular status: stable Postop Assessment: no apparent nausea or vomiting Anesthetic complications: no   No complications documented.  Last Vitals:  Vitals:   05/05/20 1000 05/05/20 1236  BP: (!) 146/87 137/84  Pulse: 86 85  Resp: (!) 23 19  Temp:  36.8 C  SpO2: 94% 92%    Last Pain:  Vitals:   05/05/20 1236  TempSrc: Oral  PainSc: 3                  Estefan Pattison

## 2020-05-05 NOTE — Telephone Encounter (Signed)
Patient's nurse called stating that patient would like to know if compression stockings could be removed?  Cb# [phone].  Please advise.  Thank you.

## 2020-05-05 NOTE — Progress Notes (Signed)
Patient ID: Jonathan Shaffer, male   DOB: [birth year], 54 y.o.   MRN: [id number] S: Feels better today and had significant diuresis overnight. O:BP (!) 152/93   Pulse 84   Temp 97.8 F (36.6 C) (Oral)   Resp 20   Ht _0  (1.905 m)   Wt (!) 136.9 kg   SpO2 95%   BMI 37.72 kg/m   Intake/Output Summary (Last 24 hours) at 05/05/2020 1005 Last data filed at 05/05/2020 0900 Gross per 24 hour  Intake 598.99 ml  Output 5745 ml  Net -5146.01 ml   Intake/Output: I/O last 3 completed shifts: In: 870.1 [P.O.:120; IV Piggyback:750.1] Out: 6780 [Urine:5500; Chest Tube:1280]  Intake/Output this shift:  Total I/O In: 41.3 [IV Piggyback:41.3] Out: 350 [Urine:350] Weight change: -3.1 kg Gen: NAD CVS: RRR, no rub, +metallic click Resp: cta Abd: +BS, soft, NT Ext: 2+ pretibial edema bilaterally  Recent Labs  Lab 04/14/2020 2322 04/10/2020 0918 05/01/20 0350 05/01/20 1741 05/02/20 0407 05/03/20 0424 05/04/20 0331 05/04/20 2000 05/05/20 0313  NA 135   < > 137 140 135 135 136 135 135  K 3.5   < > 4.9 4.1 4.5 4.4 3.8 3.3* 3.2*  CL 103   < > 102 106 101 98 100 99 98  CO2 22   < > 21* 21* _1 GLUCOSE 88   < > 137* 111* 128* 144* 143* 164* 111*  BUN 9   < > 15 19 25* 32* 34* 33* 35*  CREATININE 1.20   < > 1.79* 2.02* 2.46* 2.92* 3.07* 3.00* 3.13*  ALBUMIN 1.3*  --   --  1.8*  --  1.4* 1.7*  --  1.5*  1.5*  CALCIUM 7.5*   < > 7.1* 6.8* 7.3* 6.9* 6.9* 7.0* 7.1*  PHOS  --   --   --   --   --   --   --   --  7.5*  AST 22  --   --  3,257*  --  1,021* 480*  --  198*  ALT 14  --   --  984*  --  686* 517*  --  343*   < > = values in this interval not displayed.   Liver Function Tests: Recent Labs  Lab 05/03/20 0424 05/04/20 0331 05/05/20 0313  AST 1,021* 480* 198*  ALT 686* 517* 343*  ALKPHOS 372* 348* 327*  BILITOT 3.7* 3.1* 3.2*  PROT 5.6* 5.9* 6.3*  ALBUMIN 1.4* 1.7* 1.5*  1.5*   No results for input(s): LIPASE, AMYLASE in the last 168 hours. Recent Labs  Lab  05/03/20 0824  AMMONIA 43*   CBC: Recent Labs  Lab 04/23/2020 0258 04/17/2020 0918 05/01/20 1431 05/01/20 1431 05/02/20 0407 05/02/20 0407 05/03/20 0424 05/04/20 0331 05/05/20 0313  WBC 19.2*   < > 19.9*   < > 26.2*   < > 30.3* 26.2* 23.5*  NEUTROABS 17.5*  --   --   --  22.9*  --   --   --  20.3*  HGB 10.3*   < > 8.9*   < > 9.7*   < > 10.1* 9.4* 9.6*  HCT 33.0*   < > 27.0*   < > 29.1*   < > 31.1* 28.9* 28.8*  MCV 94.6   < > 90.9  --  91.5  --  92.6 92.3 90.9  PLT 640*   < > 242   < > 279   < > 311 286  334   < > = values in this interval not displayed.   Cardiac Enzymes: No results for input(s): CKTOTAL, CKMB, CKMBINDEX, TROPONINI in the last 168 hours. CBG: Recent Labs  Lab 05/04/20 1537 05/04/20 2027 05/04/20 2332 05/05/20 0347 05/05/20 0658  GLUCAP 159* 138* 134* 94 67*    Iron Studies: No results for input(s): IRON, TIBC, TRANSFERRIN, FERRITIN in the last 72 hours. Studies/Results: DG Chest 1 View  Result Date: 05/04/2020 CLINICAL DATA:  Post MVR, chest tube EXAM: CHEST  1 VIEW COMPARISON:  Portable exam 0553 hours compared to 05/03/2020 FINDINGS: LEFT jugular central venous catheter unchanged. BILATERAL thoracostomy tubes unchanged. Enlargement of cardiac silhouette post median sternotomy and MVR. Pacing leads project over chest. Bibasilar atelectasis greater on LEFT with small LEFT pleural effusion. Upper lungs clear. No pneumothorax. IMPRESSION: Bibasilar atelectasis LEFT greater than RIGHT. Small LEFT pleural effusion. Electronically Signed   By: Lavonia Dana M.D.   On: 05/04/2020 08:18   DG Abd 1 View  Result Date: 05/04/2020 CLINICAL DATA:  Occluded EXAM: ABDOMEN - 1 VIEW COMPARISON:  CT abdomen and pelvis April 22, 2020 FINDINGS: Loops of mildly dilated bowel throughout the abdomen persist without appreciable air-fluid levels. No free air evident on supine imaging. Multiple surgical clips noted throughout the abdomen and upper pelvis. IMPRESSION: Suspect  postoperative ileus, although a degree of partial bowel obstruction cannot be excluded in this circumstance. No free air appreciable on supine examination. Multiple surgical clips present. Electronically Signed   By: Lowella Grip III M.D.   On: 05/04/2020 08:52   [place] LT LOWER EXTREM LTD SOFT TISSUE NON VASCULAR  Result Date: 05/03/2020 CLINICAL DATA:  Follow-up left great toe lesion EXAM: ULTRASOUND  LEFT FIRST TOE TECHNIQUE: Ultrasound examination of the lower extremity soft tissues was performed in the area of clinical concern. COMPARISON:  Ultrasound April 24, 2020. FINDINGS: Decreased size of the complex fluid collection along the medial aspect of left great toe which now measures 2.2 x 2.2 x 1.3 cm previously 2.4 x 2.3 x 1.5 cm. IMPRESSION: Interval decrease in size of the phlegmonous collection/developing abscess along the medial aspect of the left great toe. Electronically Signed   By: Dahlia Bailiff MD   On: 05/03/2020 12:47   VAS US RENAL ARTERY DUPLEX  Result Date: 05/03/2020 ABDOMINAL VISCERAL Indications: Endocarditis, acute kidney injury concern for embolization to renal              artery. High Risk Factors: Hypertension, hyperlipidemia, Diabetes, coronary artery                    disease. 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 Limitations: Air/bowel gas and obesity. Performing Technologist: Oda Cogan RDMS, RVT  Examination Guidelines: A complete evaluation includes B-mode imaging, spectral Doppler, color Doppler, and power Doppler as needed of all accessible portions of each vessel. Bilateral testing is considered an integral part of a complete examination. Limited examinations for reoccurring indications may be performed as noted.  Duplex Findings: +--------------------+--------+--------+------+--------------+ Mesenteric          PSV cm/sEDV cm/sPlaque   Comments    +--------------------+--------+--------+------+--------------+ Aorta Prox                                 not visualized +--------------------+--------+--------+------+--------------+ Celiac Artery Origin                      not visualized +--------------------+--------+--------+------+--------------+  SMA Proximal                              not visualized +--------------------+--------+--------+------+--------------+   +------------------+--------+--------+--------+ Right Renal ArteryPSV cm/sEDV cm/sComment  +------------------+--------+--------+--------+ Origin                            not seen +------------------+--------+--------+--------+ Proximal             70      17            +------------------+--------+--------+--------+ Mid                  46      19            +------------------+--------+--------+--------+ Distal               66      19            +------------------+--------+--------+--------+ +-----------------+--------+--------+--------+ Left Renal ArteryPSV cm/sEDV cm/sComment  +-----------------+--------+--------+--------+ Origin                           not seen +-----------------+--------+--------+--------+ Proximal                         not seen +-----------------+--------+--------+--------+ Mid                 70      25            +-----------------+--------+--------+--------+ Distal              54      17            +-----------------+--------+--------+--------+  Technologist observations: Technically difficult study due to patient's body habitus and excessive air and gas. +------------+--------+--------+----+-----------+--------+--------+----+ Right KidneyPSV cm/sEDV cm/sRI  Left KidneyPSV cm/sEDV cm/sRI   +------------+--------+--------+----+-----------+--------+--------+----+ Upper Pole                      Upper Pole                      +------------+--------+--------+----+-----------+--------+--------+----+ Mid         36      11      0.69Mid        38      11      0.72  +------------+--------+--------+----+-----------+--------+--------+----+ Lower Pole  23      8       0.65Lower Pole 25      9       0.64 +------------+--------+--------+----+-----------+--------+--------+----+ Hilar       17      6       0.63Hilar                           +------------+--------+--------+----+-----------+--------+--------+----+ +------------------+-----+------------------+-----+ Right Kidney           Left Kidney             +------------------+-----+------------------+-----+ RAR                    RAR                     +------------------+-----+------------------+-----+ RAR (manual)           RAR (manual)            +------------------+-----+------------------+-----+  Cortex                 Cortex                  +------------------+-----+------------------+-----+ Cortex thickness       Corex thickness         +------------------+-----+------------------+-----+ Kidney length (cm)11.25Kidney length (cm)13.00 +------------------+-----+------------------+-----+  Summary: Renal:  Right: Patent renal artery. Left:  Patent renal artery.  *See table(s) above for measurements and observations.  Diagnosing physician: Deitra Mayo MD  Electronically signed by Deitra Mayo MD on 05/03/2020 at 7:56:09 PM.    Final    . sodium chloride   Intravenous Once  . sodium chloride   Intravenous Once  . acetaminophen  500 mg Oral Q8H  . aspirin EC  81 mg Oral Daily  . atorvastatin  40 mg Oral Daily  . bisacodyl  10 mg Oral Daily   Or  . bisacodyl  10 mg Rectal Daily  . Chlorhexidine Gluconate Cloth  6 each Topical Daily  . colchicine  0.6 mg Oral BID  . docusate sodium  200 mg Oral Daily  . feeding supplement  237 mL Oral BID BM  . insulin aspart  2-6 Units Subcutaneous Q4H  . insulin detemir  8 Units Subcutaneous Q12H  . lactulose  30 g Oral TID  . lidocaine  1 patch Transdermal Daily  . loratadine  10 mg Oral Daily  . mouth rinse  15 mL  Mouth Rinse BID  . multivitamin with minerals  1 tablet Oral Daily  . mupirocin ointment  1 application Nasal BID  . pantoprazole  40 mg Oral Daily  . potassium chloride  40 mEq Oral Once  . sodium chloride flush  10-40 mL Intracatheter Q12H  . sodium chloride flush  3 mL Intravenous Q12H  . Warfarin - Pharmacist Dosing Inpatient   Does not apply q1600    BMET    Component Value Date/Time   NA 135 05/05/2020 0313   K 3.2 (L) 05/05/2020 0313   CL 98 05/05/2020 0313   CO2 24 05/05/2020 0313   GLUCOSE 111 (H) 05/05/2020 0313   BUN 35 (H) 05/05/2020 0313   CREATININE 3.13 (H) 05/05/2020 0313   CALCIUM 7.1 (L) 05/05/2020 0313   GFRNONAA 23 (L) 05/05/2020 0313   CBC    Component Value Date/Time   WBC 23.5 (H) 05/05/2020 0313   RBC 3.17 (L) 05/05/2020 0313   HGB 9.6 (L) 05/05/2020 0313   HCT 28.8 (L) 05/05/2020 0313   PLT 334 05/05/2020 0313   MCV 90.9 05/05/2020 0313   MCH 30.3 05/05/2020 0313   MCHC 33.3 05/05/2020 0313   RDW 17.1 (H) 05/05/2020 0313   LYMPHSABS 1.8 05/05/2020 0313   MONOABS 0.9 05/05/2020 0313   EOSABS 0.0 05/05/2020 0313   BASOSABS 0.1 05/05/2020 0313     Assessment/Plan: 1.  AKI, non-oliguric- Unclear etiology, however he has had multiple renal insults with hypotension following surgery requiring pressors, multiple antibiotics, IV contrast, and diuretics with marked diuresis since his surgery. UA with moderate blood, trace leukocytes, negative protein.  DDx includes ischemic ATN from hypotension/pressors, AIN from Nafcillin, immune mediated GN due to endocarditis, septic emboli to kidneys, or pre-renal azotemia due to diuresis and 3rd spacing.   1. + urine eosinophils, however no eosinophilia on cbc with diff.  Nonspecific and could be due to AIN or atheroemboli to kidneys. Discussed with ID and if he has been treated appropriately for CNS infection, would recommend  stopping Nafcillin and switching to IV ancef for osteo and septic arthritis for the  remainder of his abx course. 2. Serologies to r/o acute GN are pending.  C3 low but normal C4 consistent with alternative pathway activation  3. Renal artery duplex without stenosis 4. Consider imaging kidneys with MRI to evaluate for possible septic emboli involvement (although not seen on CT of abd with contrast dated 04/15/2020). 5. Hold diuretics for now and follow UOP and daily Scr.   6. Renal dose Nafcillin and other meds as his eGFR is <20 with rising BUN/Cr.  Avoid nephrotoxic agents (NSAIDs/IV contrast, Fleets enema). 2. 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  ID following and will require 6 weeks of IV abx. 3. S/p MVR 4. Sinus node dysfunction- has known atrial rhythm and may require PPM 5. DM type 2 6. Anemia of critical illness/ABLA following surgery- transfuse as needed 7. Acute CHF following sinus arrest 8. Rash- on torso, possible drug rash from Nafcillin.  ID following and will let us know if he has been treated long enough for CNS involvement and if so will change to ancef.  Donetta Potts, MD [employer] [phone]

## 2020-05-05 NOTE — Progress Notes (Signed)
CBG 67. Patient given 4 OZ apple juice. Pt is aao, vss, preparing to eat breakfast.

## 2020-05-05 NOTE — Progress Notes (Signed)
Inpatient Diabetes Program Recommendations  AACE/ADA: New Consensus Statement on Inpatient Glycemic Control (2015)  Target Ranges:  Prepandial:   less than 140 mg/dL      Peak postprandial:   less than 180 mg/dL (1-2 hours)      Critically ill patients:  140 - 180 mg/dL   Lab Results  Component Value Date   GLUCAP 67 (L) 05/05/2020   HGBA1C 10.8 (H) 04/12/2020    Review of Glycemic Control Results for JACOLBY, RISBY (MRN [id number]) as of 05/05/2020 11:46  Ref. Range 05/04/2020 15:37 05/04/2020 20:27 05/04/2020 23:32 05/05/2020 03:47 05/05/2020 06:58  Glucose-Capillary Latest Ref Range: 70 - 99 mg/dL 159 (H) 138 (H) 134 (H) 94 67 (L)   Diabetes history: New diagnosis of DM Outpatient Diabetes medications:  None Current orders for Inpatient glycemic control:  Novolog 2-4-6 units q 4 hours Levemir 8 units bid  Inpatient Diabetes Program Recommendations:   Please consider reducing Levemir to 8 units daily.  Also consider reducing Novolog correction to sensitive tid with meals and HS.   Thanks  Adah Perl, RN, BC-ADM Inpatient Diabetes Coordinator Pager [phone] (8a-5p)

## 2020-05-06 ENCOUNTER — Inpatient Hospital Stay (HOSPITAL_COMMUNITY): Payer: BC Managed Care – PPO

## 2020-05-06 ENCOUNTER — Inpatient Hospital Stay (HOSPITAL_COMMUNITY): Payer: BC Managed Care – PPO | Admitting: Certified Registered"

## 2020-05-06 ENCOUNTER — Inpatient Hospital Stay: Payer: Self-pay

## 2020-05-06 DIAGNOSIS — K567 Ileus, unspecified: Secondary | ICD-10-CM

## 2020-05-06 DIAGNOSIS — I313 Pericardial effusion (noninflammatory): Secondary | ICD-10-CM

## 2020-05-06 DIAGNOSIS — L02413 Cutaneous abscess of right upper limb: Secondary | ICD-10-CM | POA: Diagnosis not present

## 2020-05-06 LAB — RENAL FUNCTION PANEL
Albumin: 1.6 g/dL — ABNORMAL LOW (ref 3.5–5.0)
Anion gap: 14 (ref 5–15)
BUN: 33 mg/dL — ABNORMAL HIGH (ref 6–20)
CO2: 24 mmol/L (ref 22–32)
Calcium: 7.6 mg/dL — ABNORMAL LOW (ref 8.9–10.3)
Chloride: 99 mmol/L (ref 98–111)
Creatinine, Ser: 3.06 mg/dL — ABNORMAL HIGH (ref 0.61–1.24)
GFR, Estimated: 23 mL/min — ABNORMAL LOW (ref >60)
Glucose, Bld: 107 mg/dL — ABNORMAL HIGH (ref 70–99)
Phosphorus: 6.7 mg/dL — ABNORMAL HIGH (ref 2.5–4.6)
Potassium: 3.2 mmol/L — ABNORMAL LOW (ref 3.5–5.1)
Sodium: 137 mmol/L (ref 135–145)

## 2020-05-06 LAB — BASIC METABOLIC PANEL
Anion gap: 17 — ABNORMAL HIGH (ref 5–15)
Anion gap: 16 — ABNORMAL HIGH (ref 5–15)
BUN: 35 mg/dL — ABNORMAL HIGH (ref 6–20)
BUN: 34 mg/dL — ABNORMAL HIGH (ref 6–20)
CO2: 21 mmol/L — ABNORMAL LOW (ref 22–32)
CO2: 23 mmol/L (ref 22–32)
Calcium: 7.3 mg/dL — ABNORMAL LOW (ref 8.9–10.3)
Calcium: 7 mg/dL — ABNORMAL LOW (ref 8.9–10.3)
Chloride: 99 mmol/L (ref 98–111)
Chloride: 98 mmol/L (ref 98–111)
Creatinine, Ser: 3.49 mg/dL — ABNORMAL HIGH (ref 0.61–1.24)
Creatinine, Ser: 3.6 mg/dL — ABNORMAL HIGH (ref 0.61–1.24)
GFR, Estimated: 20 mL/min — ABNORMAL LOW (ref >60)
GFR, Estimated: 19 mL/min — ABNORMAL LOW (ref >60)
Glucose, Bld: 123 mg/dL — ABNORMAL HIGH (ref 70–99)
Glucose, Bld: 108 mg/dL — ABNORMAL HIGH (ref 70–99)
Potassium: 3.5 mmol/L (ref 3.5–5.1)
Potassium: 3.5 mmol/L (ref 3.5–5.1)
Sodium: 137 mmol/L (ref 135–145)
Sodium: 137 mmol/L (ref 135–145)

## 2020-05-06 LAB — CBC WITH DIFFERENTIAL/PLATELET
Abs Immature Granulocytes: 0.26 10*3/uL — ABNORMAL HIGH (ref 0.00–0.07)
Basophils Absolute: 0.1 10*3/uL (ref 0.0–0.1)
Basophils Relative: 0 %
Eosinophils Absolute: 0 10*3/uL (ref 0.0–0.5)
Eosinophils Relative: 0 %
HCT: 32.1 % — ABNORMAL LOW (ref 39.0–52.0)
Hemoglobin: 10.5 g/dL — ABNORMAL LOW (ref 13.0–17.0)
Immature Granulocytes: 1 %
Lymphocytes Relative: 6 %
Lymphs Abs: 1.4 10*3/uL (ref 0.7–4.0)
MCH: 29.6 pg (ref 26.0–34.0)
MCHC: 32.7 g/dL (ref 30.0–36.0)
MCV: 90.4 fL (ref 80.0–100.0)
Monocytes Absolute: 0.9 10*3/uL (ref 0.1–1.0)
Monocytes Relative: 4 %
Neutro Abs: 19.8 10*3/uL — ABNORMAL HIGH (ref 1.7–7.7)
Neutrophils Relative %: 89 %
Platelets: 407 10*3/uL — ABNORMAL HIGH (ref 150–400)
RBC: 3.55 MIL/uL — ABNORMAL LOW (ref 4.22–5.81)
RDW: 18.4 % — ABNORMAL HIGH (ref 11.5–15.5)
WBC: 22.5 10*3/uL — ABNORMAL HIGH (ref 4.0–10.5)
nRBC: 0.4 % — ABNORMAL HIGH (ref 0.0–0.2)

## 2020-05-06 LAB — CBC
HCT: 28.1 % — ABNORMAL LOW (ref 39.0–52.0)
HCT: 26.8 % — ABNORMAL LOW (ref 39.0–52.0)
Hemoglobin: 9.3 g/dL — ABNORMAL LOW (ref 13.0–17.0)
Hemoglobin: 8.9 g/dL — ABNORMAL LOW (ref 13.0–17.0)
MCH: 30.9 pg (ref 26.0–34.0)
MCH: 30.8 pg (ref 26.0–34.0)
MCHC: 33.1 g/dL (ref 30.0–36.0)
MCHC: 33.2 g/dL (ref 30.0–36.0)
MCV: 93.4 fL (ref 80.0–100.0)
MCV: 92.7 fL (ref 80.0–100.0)
Platelets: 338 10*3/uL (ref 150–400)
Platelets: 322 10*3/uL (ref 150–400)
RBC: 3.01 MIL/uL — ABNORMAL LOW (ref 4.22–5.81)
RBC: 2.89 MIL/uL — ABNORMAL LOW (ref 4.22–5.81)
RDW: 18.4 % — ABNORMAL HIGH (ref 11.5–15.5)
RDW: 18.5 % — ABNORMAL HIGH (ref 11.5–15.5)
WBC: 15.5 10*3/uL — ABNORMAL HIGH (ref 4.0–10.5)
WBC: 14.5 10*3/uL — ABNORMAL HIGH (ref 4.0–10.5)
nRBC: 0.7 % — ABNORMAL HIGH (ref 0.0–0.2)
nRBC: 0.5 % — ABNORMAL HIGH (ref 0.0–0.2)

## 2020-05-06 LAB — POCT I-STAT 7, (LYTES, BLD GAS, ICA,H+H)
Acid-Base Excess: 1 mmol/L (ref 0.0–2.0)
Acid-base deficit: 2 mmol/L (ref 0.0–2.0)
Bicarbonate: 24.1 mmol/L (ref 20.0–28.0)
Bicarbonate: 26.5 mmol/L (ref 20.0–28.0)
Calcium, Ion: 1 mmol/L — ABNORMAL LOW (ref 1.15–1.40)
Calcium, Ion: 0.98 mmol/L — ABNORMAL LOW (ref 1.15–1.40)
HCT: 30 % — ABNORMAL LOW (ref 39.0–52.0)
HCT: 30 % — ABNORMAL LOW (ref 39.0–52.0)
Hemoglobin: 10.2 g/dL — ABNORMAL LOW (ref 13.0–17.0)
Hemoglobin: 10.2 g/dL — ABNORMAL LOW (ref 13.0–17.0)
O2 Saturation: 100 %
O2 Saturation: 91 %
Patient temperature: 99.9
Patient temperature: 99.5
Potassium: 3.5 mmol/L (ref 3.5–5.1)
Potassium: 3.6 mmol/L (ref 3.5–5.1)
Sodium: 139 mmol/L (ref 135–145)
Sodium: 139 mmol/L (ref 135–145)
TCO2: 25 mmol/L (ref 22–32)
TCO2: 28 mmol/L (ref 22–32)
pCO2 arterial: 47.2 mmHg (ref 32.0–48.0)
pCO2 arterial: 43.8 mmHg (ref 32.0–48.0)
pH, Arterial: 7.319 — ABNORMAL LOW (ref 7.350–7.450)
pH, Arterial: 7.392 (ref 7.350–7.450)
pO2, Arterial: 226 mmHg — ABNORMAL HIGH (ref 83.0–108.0)
pO2, Arterial: 64 mmHg — ABNORMAL LOW (ref 83.0–108.0)

## 2020-05-06 LAB — GLUCOSE, CAPILLARY
Glucose-Capillary: 108 mg/dL — ABNORMAL HIGH (ref 70–99)
Glucose-Capillary: 111 mg/dL — ABNORMAL HIGH (ref 70–99)
Glucose-Capillary: 79 mg/dL (ref 70–99)
Glucose-Capillary: 79 mg/dL (ref 70–99)
Glucose-Capillary: 83 mg/dL (ref 70–99)
Glucose-Capillary: 87 mg/dL (ref 70–99)
Glucose-Capillary: 98 mg/dL (ref 70–99)

## 2020-05-06 LAB — COOXEMETRY PANEL
Carboxyhemoglobin: 0.9 % (ref 0.5–1.5)
Methemoglobin: 1.1 % (ref 0.0–1.5)
O2 Saturation: 67 %
Total hemoglobin: 9.6 g/dL — ABNORMAL LOW (ref 12.0–16.0)

## 2020-05-06 LAB — IGG, IGA, IGM
IgA: 469 mg/dL — ABNORMAL HIGH (ref 90–386)
IgG (Immunoglobin G), Serum: 2234 mg/dL — ABNORMAL HIGH (ref 603–1613)
IgM (Immunoglobulin M), Srm: 229 mg/dL — ABNORMAL HIGH (ref 20–172)

## 2020-05-06 LAB — PROTIME-INR
INR: 2.5 — ABNORMAL HIGH (ref 0.8–1.2)
INR: 2.6 — ABNORMAL HIGH (ref 0.8–1.2)
Prothrombin Time: 26.2 seconds — ABNORMAL HIGH (ref 11.4–15.2)
Prothrombin Time: 27.3 seconds — ABNORMAL HIGH (ref 11.4–15.2)

## 2020-05-06 LAB — HEPATIC FUNCTION PANEL
ALT: 270 U/L — ABNORMAL HIGH (ref 0–44)
AST: 118 U/L — ABNORMAL HIGH (ref 15–41)
Albumin: 1.6 g/dL — ABNORMAL LOW (ref 3.5–5.0)
Alkaline Phosphatase: 316 U/L — ABNORMAL HIGH (ref 38–126)
Bilirubin, Direct: 0.8 mg/dL — ABNORMAL HIGH (ref 0.0–0.2)
Indirect Bilirubin: 1.9 mg/dL — ABNORMAL HIGH (ref 0.3–0.9)
Total Bilirubin: 2.7 mg/dL — ABNORMAL HIGH (ref 0.3–1.2)
Total Protein: 6.9 g/dL (ref 6.5–8.1)

## 2020-05-06 LAB — ECHOCARDIOGRAM LIMITED
Height: 75 in
Weight: 4776.05 oz

## 2020-05-06 LAB — TRIGLYCERIDES: Triglycerides: 110 mg/dL (ref <150)

## 2020-05-06 IMAGING — DX DG CHEST 1V PORT
1 series · 1 of 1 positions shown · non-contrast
Comparison: One-view chest x-ray [DATE]

CLINICAL DATA: Central placement.

EXAM:
PORTABLE CHEST 1 VIEW

[chest ap]
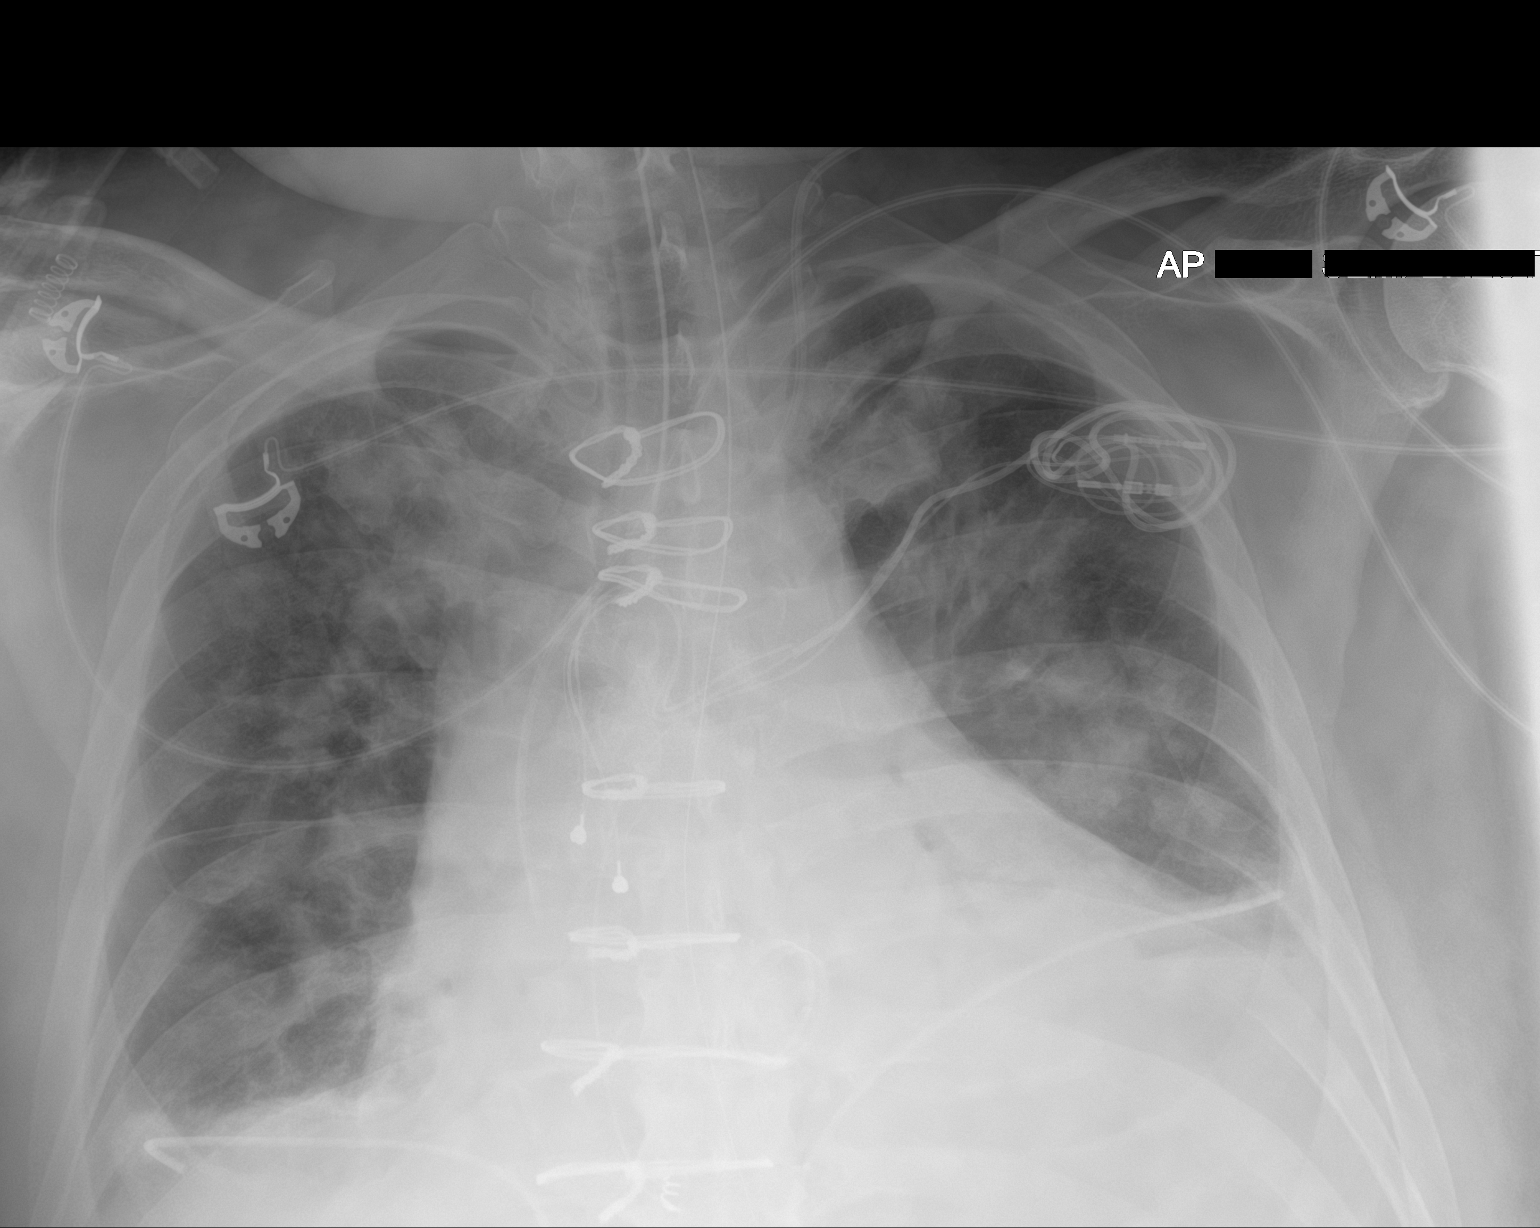

[1 of 1 positions shown; findings below may reference images not displayed]

FINDINGS: Heart is enlarged. Endotracheal tube is stable, 4 cm above the
carina. Pacing wires are stable. Enteric tube courses off the
inferior border the film. A new left IJ line is in place. The tip is
at the cavoatrial junction.

Interstitial edema has increased slightly. Bilateral pleural
effusions are noted. Bilateral chest tubes are in place. No
pneumothorax is present.
IMPRESSION: 1. Interval placement of left IJ line with the tip at the cavoatrial
junction.
2. Slight increase in interstitial edema.
3. Stable bilateral pleural effusions.
4. Stable support apparatus otherwise.

## 2020-05-06 IMAGING — DX DG ABDOMEN 1V
1 series · 1 of 1 positions shown · non-contrast
Comparison: [DATE]

CLINICAL DATA: Nasogastric intubation

EXAM:
ABDOMEN - 1 VIEW

[abdomen supine]
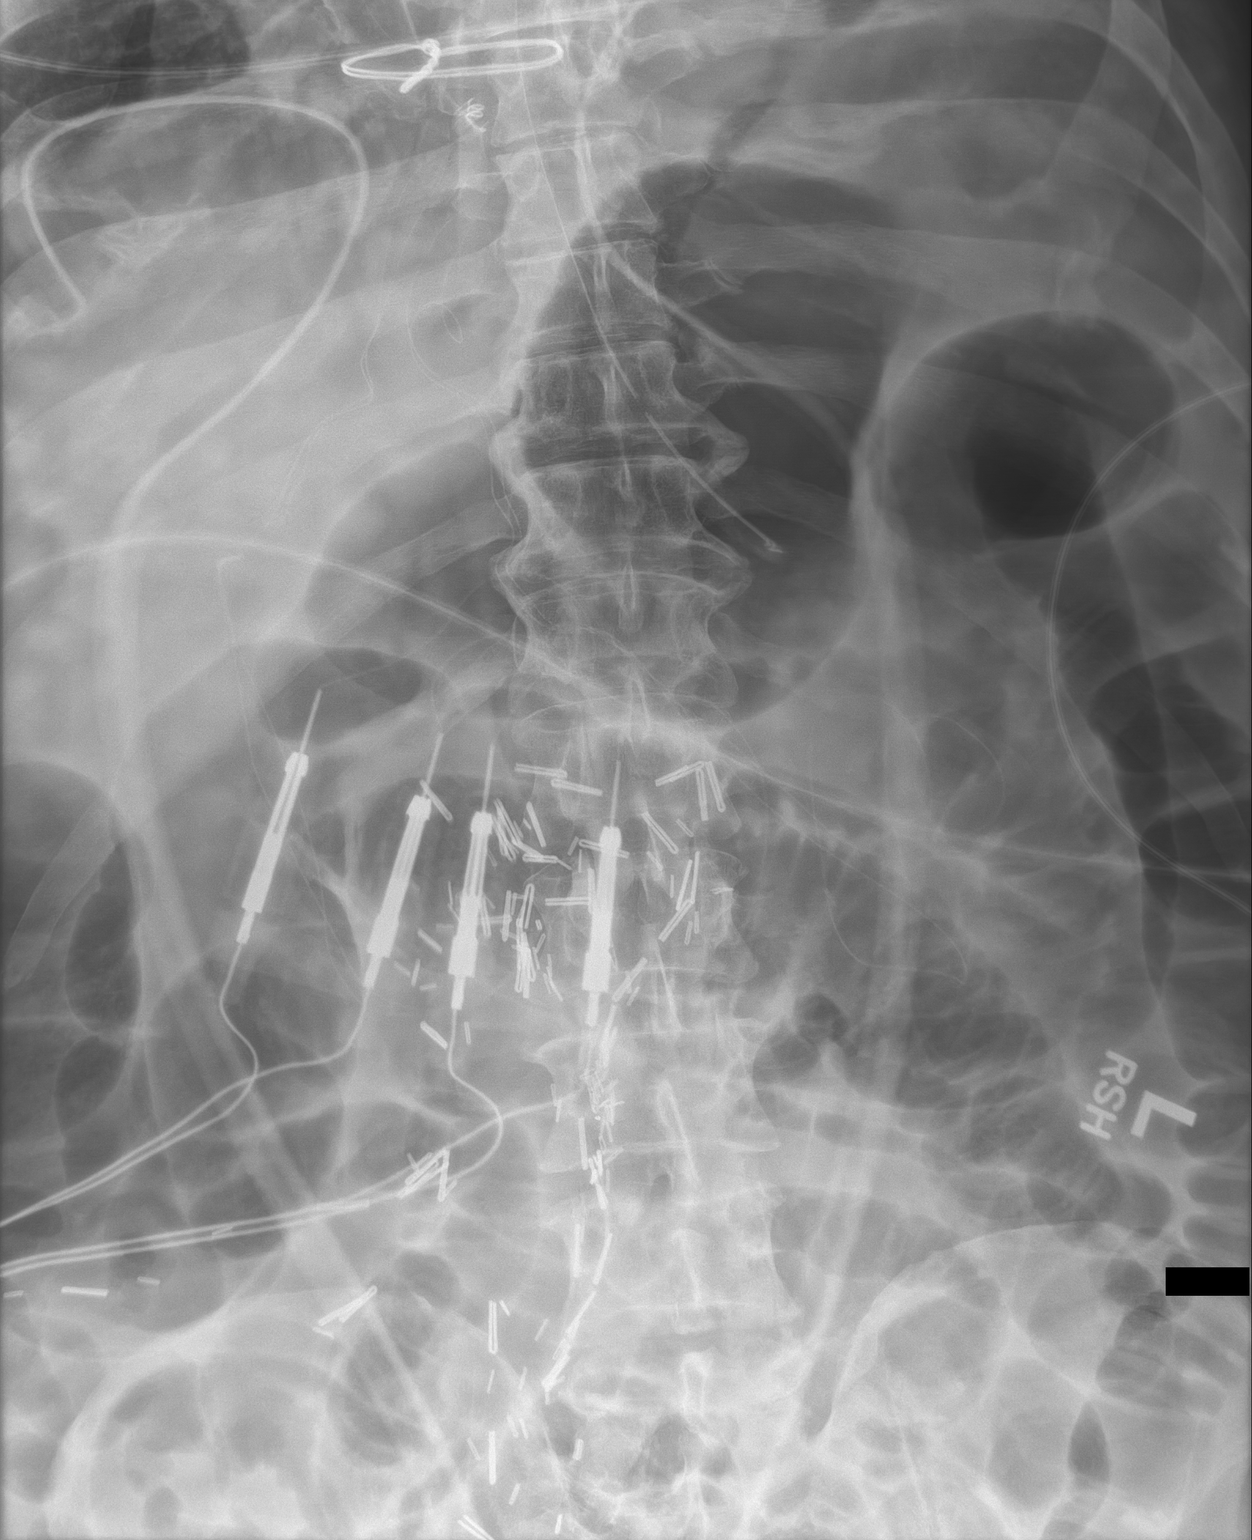

[1 of 1 positions shown; findings below may reference images not displayed]

FINDINGS: Nasogastric tube is seen with its tip overlying the expected
proximal body of the stomach. Multiple gas-filled loops of large and
small bowel are seen within the visualized abdomen suggestive of an
adynamic ileus. Pelvis and bilateral flanks are excluded from view.
IMPRESSION: Nasogastric tube within the proximal body of the stomach.

## 2020-05-06 IMAGING — DX DG CHEST 1V PORT
1 series · 1 of 1 positions shown · non-contrast
Comparison: Two days ago

CLINICAL DATA: Intubation

EXAM:
PORTABLE CHEST 1 VIEW

[chest ap]
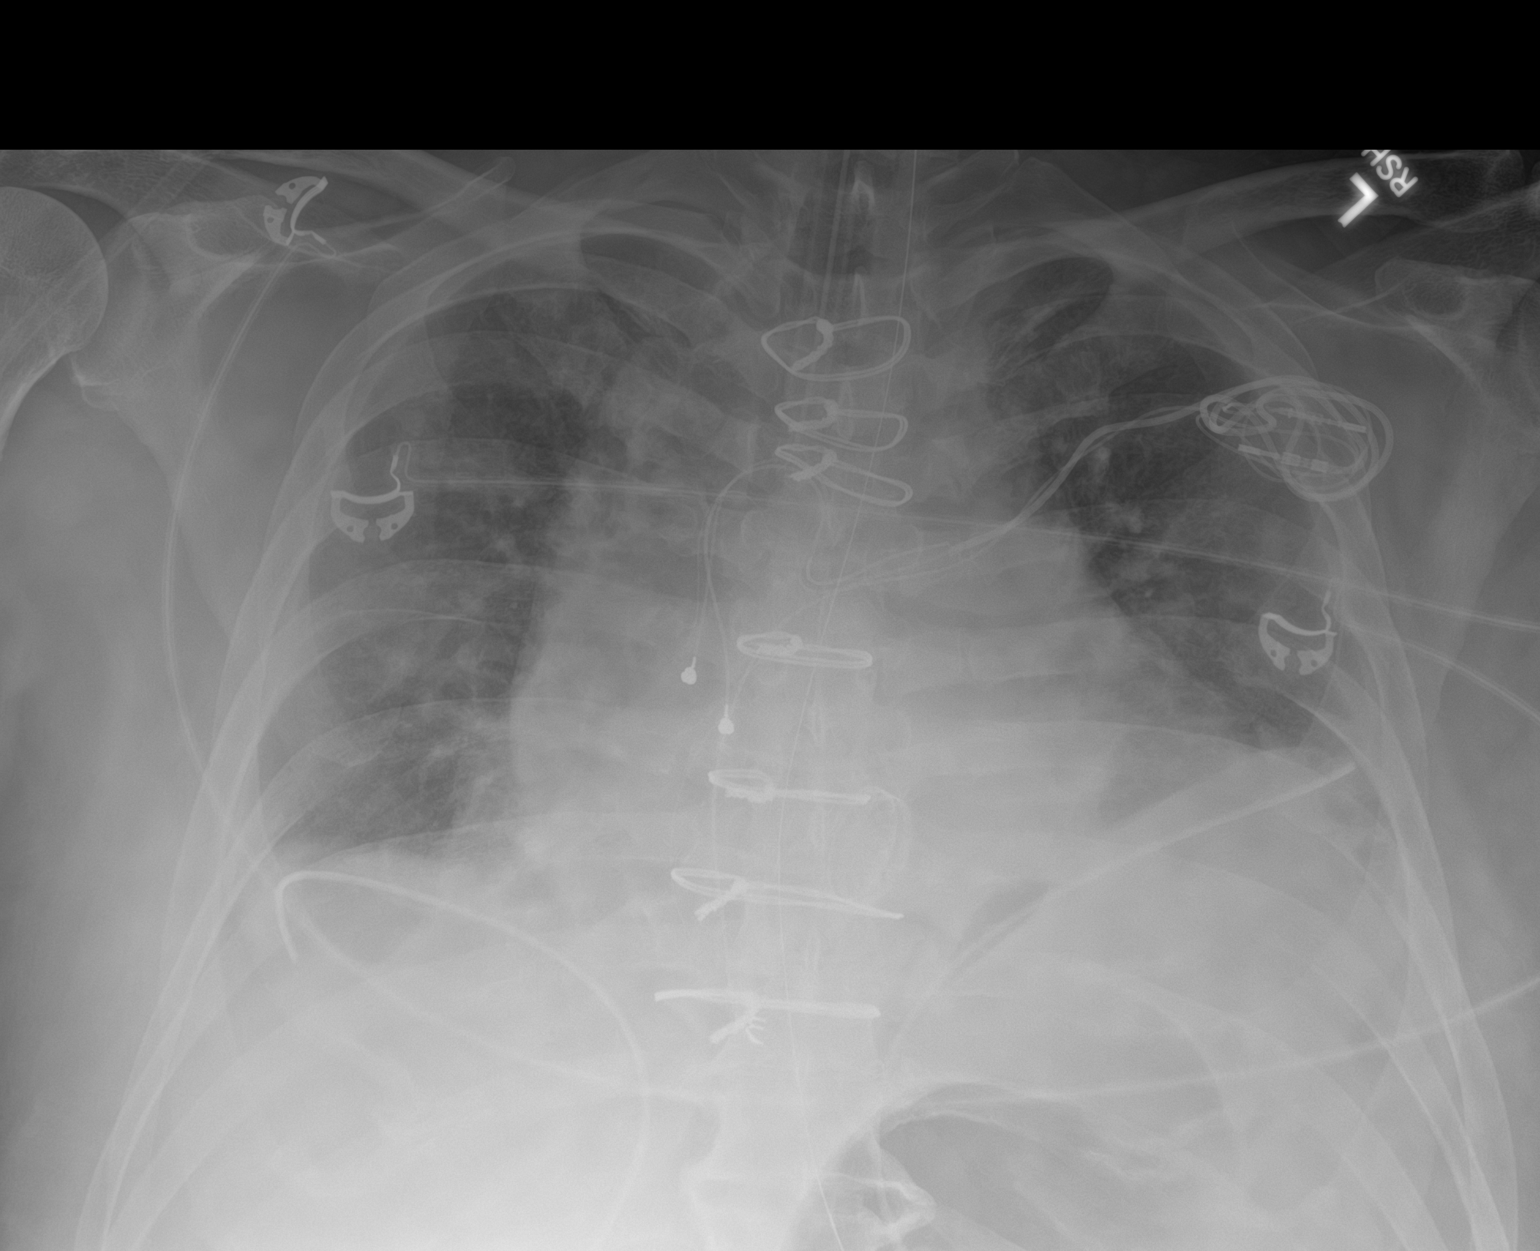

[1 of 1 positions shown; findings below may reference images not displayed]

FINDINGS: Endotracheal tube with tip between the clavicular heads and carina.
The left IJ sheath has been removed. There is an enteric tube which
at least reaches the stomach. Direct pacer leads without battery
pack. Chest tubes in place. Low volume chest with
atelectasis/vascular congestion that is increased. Stable
cardiopericardial enlargement.
IMPRESSION: 1. Unremarkable hardware positioning.
2. Increased vascular congestion or atelectasis. No visible
pneumothorax.

## 2020-05-06 MED ORDER — PANTOPRAZOLE SODIUM 40 MG PO PACK
40.0000 mg | PACK | Freq: Every day | ORAL | Status: DC
  Filled 2020-05-06: qty 20

## 2020-05-06 MED ORDER — SODIUM BICARBONATE 8.4 % IV SOLN
INTRAVENOUS | Status: AC
  Filled 2020-05-06: qty 100

## 2020-05-06 MED ORDER — ACETAMINOPHEN 160 MG/5ML PO SOLN
500.0000 mg | Freq: Three times a day (TID) | ORAL | Status: DC
  Filled 2020-05-06: qty 20.3

## 2020-05-06 MED ORDER — LORATADINE 10 MG PO TABS
10.0000 mg | ORAL_TABLET | Freq: Every day | ORAL | Status: DC
  Filled 2020-05-06: qty 1

## 2020-05-06 MED ORDER — POTASSIUM CHLORIDE 20 MEQ PO PACK: 40.0000 meq | PACK | Freq: Every day | ORAL | Status: DC

## 2020-05-06 MED ORDER — SODIUM CHLORIDE 0.9 % IV SOLN: INTRAVENOUS | Status: DC | PRN

## 2020-05-06 MED ORDER — PROPOFOL 1000 MG/100ML IV EMUL
5.0000 ug/kg/min | INTRAVENOUS | Status: DC
  Administered 2020-05-06 (×2): 20 ug/kg/min via INTRAVENOUS
  Administered 2020-05-06 – 2020-05-08 (×6): 10 ug/kg/min via INTRAVENOUS
  Filled 2020-05-06 (×7): qty 100

## 2020-05-06 MED ORDER — ASPIRIN 81 MG PO CHEW
162.0000 mg | CHEWABLE_TABLET | Freq: Every day | ORAL | Status: DC
  Filled 2020-05-06: qty 2

## 2020-05-06 MED ORDER — FENTANYL CITRATE (PF) 100 MCG/2ML IJ SOLN: 50.0000 ug | Freq: Once | INTRAMUSCULAR | Status: AC

## 2020-05-06 MED ORDER — MIDAZOLAM HCL 2 MG/2ML IJ SOLN: 2.0000 mg | Freq: Once | INTRAMUSCULAR | Status: AC

## 2020-05-06 MED ORDER — ALBUMIN HUMAN 25 % IV SOLN
25.0000 g | Freq: Four times a day (QID) | INTRAVENOUS | Status: AC
  Administered 2020-05-06 – 2020-05-07 (×4): 25 g via INTRAVENOUS
  Filled 2020-05-06 (×4): qty 100

## 2020-05-06 MED ORDER — PLASMA-LYTE A IV SOLN
INTRAVENOUS | Status: DC
  Filled 2020-05-06: qty 1000

## 2020-05-06 MED ORDER — CHLORHEXIDINE GLUCONATE 0.12% ORAL RINSE (MEDLINE KIT)
15.0000 mL | Freq: Two times a day (BID) | OROMUCOSAL | Status: DC
  Administered 2020-05-06 – 2020-05-08 (×6): 15 mL via OROMUCOSAL

## 2020-05-06 MED ORDER — POTASSIUM CHLORIDE 20 MEQ PO PACK
40.0000 meq | PACK | Freq: Every day | ORAL | Status: DC
  Administered 2020-05-06: 40 meq
  Filled 2020-05-06 (×2): qty 2

## 2020-05-06 MED ORDER — FENTANYL CITRATE (PF) 100 MCG/2ML IJ SOLN
INTRAMUSCULAR | Status: AC
  Administered 2020-05-06: 50 ug via INTRAVENOUS
  Filled 2020-05-06: qty 2

## 2020-05-06 MED ORDER — SODIUM BICARBONATE 8.4 % IV SOLN
50.0000 meq | Freq: Once | INTRAVENOUS | Status: AC
  Administered 2020-05-06: 50 meq via INTRAVENOUS

## 2020-05-06 MED ORDER — NOREPINEPHRINE 4 MG/250ML-% IV SOLN
0.0000 ug/min | INTRAVENOUS | Status: DC
  Administered 2020-05-06: 25 ug/min via INTRAVENOUS
  Filled 2020-05-06 (×2): qty 250

## 2020-05-06 MED ORDER — NOREPINEPHRINE 16 MG/250ML-% IV SOLN
0.0000 ug/min | INTRAVENOUS | Status: DC
  Administered 2020-05-06: 40 ug/min via INTRAVENOUS
  Administered 2020-05-06: 25 ug/min via INTRAVENOUS
  Administered 2020-05-07: 11 ug/min via INTRAVENOUS
  Administered 2020-05-08: 20 ug/min via INTRAVENOUS
  Administered 2020-05-09: 35 ug/min via INTRAVENOUS
  Filled 2020-05-06 (×5): qty 250

## 2020-05-06 MED ORDER — ALBUMIN HUMAN 5 % IV SOLN
25.0000 g | Freq: Once | INTRAVENOUS | Status: AC
  Administered 2020-05-06: 25 g via INTRAVENOUS
  Filled 2020-05-06: qty 500

## 2020-05-06 MED ORDER — METOCLOPRAMIDE HCL 5 MG/ML IJ SOLN
10.0000 mg | Freq: Four times a day (QID) | INTRAMUSCULAR | Status: DC
  Administered 2020-05-06 – 2020-05-09 (×12): 10 mg via INTRAVENOUS
  Filled 2020-05-06 (×12): qty 2

## 2020-05-06 MED ORDER — ALBUMIN HUMAN 5 % IV SOLN
INTRAVENOUS | Status: AC
  Administered 2020-05-06: 12.5 g
  Filled 2020-05-06: qty 250

## 2020-05-06 MED ORDER — METRONIDAZOLE IN NACL 5-0.79 MG/ML-% IV SOLN
500.0000 mg | Freq: Three times a day (TID) | INTRAVENOUS | Status: DC
  Administered 2020-05-06 – 2020-05-09 (×9): 500 mg via INTRAVENOUS
  Filled 2020-05-06 (×9): qty 100

## 2020-05-06 MED ORDER — HYDROCORTISONE NA SUCCINATE PF 100 MG IJ SOLR
50.0000 mg | Freq: Four times a day (QID) | INTRAMUSCULAR | Status: DC
  Administered 2020-05-06 – 2020-05-09 (×12): 50 mg via INTRAVENOUS
  Filled 2020-05-06 (×13): qty 2

## 2020-05-06 MED ORDER — SODIUM CHLORIDE 0.9 % IV BOLUS
1000.0000 mL | Freq: Once | INTRAVENOUS | Status: AC
  Administered 2020-05-06: 1000 mL via INTRAVENOUS

## 2020-05-06 MED ORDER — ETOMIDATE 2 MG/ML IV SOLN
INTRAVENOUS | Status: AC
  Administered 2020-05-06: 10 mg
  Filled 2020-05-06: qty 20

## 2020-05-06 MED ORDER — PIPERACILLIN-TAZOBACTAM 3.375 G IVPB
3.3750 g | Freq: Three times a day (TID) | INTRAVENOUS | Status: DC
  Administered 2020-05-06: 3.375 g via INTRAVENOUS
  Filled 2020-05-06: qty 50

## 2020-05-06 MED ORDER — ORAL CARE MOUTH RINSE
15.0000 mL | OROMUCOSAL | Status: DC
  Administered 2020-05-06 – 2020-05-09 (×28): 15 mL via OROMUCOSAL

## 2020-05-06 MED ORDER — MIDAZOLAM HCL 2 MG/2ML IJ SOLN
INTRAMUSCULAR | Status: AC
  Filled 2020-05-06: qty 4

## 2020-05-06 MED ORDER — FENTANYL BOLUS VIA INFUSION
50.0000 ug | INTRAVENOUS | Status: DC | PRN
  Filled 2020-05-06: qty 50

## 2020-05-06 MED ORDER — ASPIRIN 300 MG RE SUPP
150.0000 mg | Freq: Every day | RECTAL | Status: DC
  Administered 2020-05-06: 150 mg via RECTAL
  Filled 2020-05-06: qty 1

## 2020-05-06 MED ORDER — FENTANYL 2500MCG IN NS 250ML (10MCG/ML) PREMIX INFUSION
50.0000 ug/h | INTRAVENOUS | Status: DC
  Administered 2020-05-06: 50 ug/h via INTRAVENOUS
  Administered 2020-05-07 – 2020-05-09 (×4): 150 ug/h via INTRAVENOUS
  Filled 2020-05-06 (×5): qty 250

## 2020-05-06 MED ORDER — QUETIAPINE FUMARATE 25 MG PO TABS: 25.0000 mg | ORAL_TABLET | Freq: Every evening | ORAL | Status: DC | PRN

## 2020-05-06 MED ORDER — MIDAZOLAM HCL 2 MG/2ML IJ SOLN
INTRAMUSCULAR | Status: AC
  Administered 2020-05-06: 2 mg via INTRAVENOUS
  Filled 2020-05-06: qty 2

## 2020-05-06 MED ORDER — TRAMADOL HCL 50 MG PO TABS: 50.0000 mg | ORAL_TABLET | ORAL | Status: DC | PRN

## 2020-05-06 MED ORDER — DEXTROSE-NACL 5-0.9 % IV SOLN: INTRAVENOUS | Status: DC

## 2020-05-06 MED ORDER — PERFLUTREN LIPID MICROSPHERE
1.0000 mL | INTRAVENOUS | Status: DC | PRN
  Filled 2020-05-06: qty 10

## 2020-05-06 MED ORDER — DEXMEDETOMIDINE HCL IN NACL 400 MCG/100ML IV SOLN
0.4000 ug/kg/h | INTRAVENOUS | Status: DC
  Administered 2020-05-06: 0.4 ug/kg/h via INTRAVENOUS
  Filled 2020-05-06: qty 100

## 2020-05-06 MED ORDER — ADULT MULTIVITAMIN LIQUID CH
15.0000 mL | Freq: Every day | ORAL | Status: DC
  Filled 2020-05-06: qty 15

## 2020-05-06 MED ORDER — PANTOPRAZOLE SODIUM 40 MG IV SOLR: 40.0000 mg | Freq: Every day | INTRAVENOUS | Status: DC

## 2020-05-06 MED ORDER — NOREPINEPHRINE 4 MG/250ML-% IV SOLN
INTRAVENOUS | Status: AC
  Administered 2020-05-06: 10 ug/min via INTRAVENOUS
  Filled 2020-05-06: qty 250

## 2020-05-06 MED ORDER — ROCURONIUM BROMIDE 10 MG/ML (PF) SYRINGE
PREFILLED_SYRINGE | INTRAVENOUS | Status: AC
  Administered 2020-05-06: 80 mg
  Filled 2020-05-06: qty 10

## 2020-05-06 MED ORDER — VASOPRESSIN 20 UNITS/100 ML INFUSION FOR SHOCK
0.0000 [IU]/min | INTRAVENOUS | Status: DC
  Administered 2020-05-06 – 2020-05-08 (×8): 0.04 [IU]/min via INTRAVENOUS
  Filled 2020-05-06 (×8): qty 100

## 2020-05-06 NOTE — Procedures (Signed)
Arterial Catheter Insertion Procedure Note  Jonathan Shaffer  [id number]  1966/03/13  Date:05/06/20  Time:4:58 AM    Provider Performing: Cordella Register    Procedure: Insertion of Arterial Line [phone]) without US guidance  Indication(s) Blood pressure monitoring and/or need for frequent ABGs  Consent Unable to obtain consent due to emergent nature of procedure.  Anesthesia None   Time Out Verified patient identification, verified procedure, site/side was marked, verified correct patient position, special equipment/implants available, medications/allergies/relevant history reviewed, required imaging and test results available.   Sterile Technique Maximal sterile technique including full sterile barrier drape, hand hygiene, sterile gown, sterile gloves, mask, hair covering, sterile ultrasound probe cover (if used).   Procedure Description Area of catheter insertion was cleaned with chlorhexidine and draped in sterile fashion. Without real-time ultrasound guidance an arterial catheter was placed into the left radial artery.  Appropriate arterial tracings confirmed on monitor.     Complications/Tolerance None; patient tolerated the procedure well.   EBL    Specimen(s) None

## 2020-05-06 NOTE — Progress Notes (Signed)
Patient ID: Jonathan Shaffer, male   DOB: Jul 18, 1965, 54 y.o.   MRN: [id number] S: Jonathan Shaffer had been doing well until he developed and acute respiratory arrest and aspiration this morning around 4 am.  He is currently intubated and on pressors. O:BP (!) 103/53   Pulse 91   Temp 100 F (37.8 C) (Axillary)   Resp (!) 26   Ht 6' 3" (1.905 m)   Wt 135.4 kg   SpO2 94%   BMI 37.31 kg/m   Intake/Output Summary (Last 24 hours) at 05/06/2020 1335 Last data filed at 05/06/2020 1200 Gross per 24 hour  Intake 3044.57 ml  Output 1833 ml  Net 1211.57 ml   Intake/Output: I/O last 3 completed shifts: In: 1311.5 [P.O.:120; I.V.:1.7; NG/GT:30; IV Piggyback:1159.8] Out: 0017 [Urine:5050; Emesis/NG output:475; Chest Tube:900]  Intake/Output this shift:  Total I/O In: 2065.4 [I.V.:860.4; IV Piggyback:1205] Out: 568 [Urine:18; Emesis/NG output:350; Chest Tube:200] Weight change: -1.8 kg Gen: intubated and sedated CVS: RRR Resp: scattered rhonchi Abd: distended, hypoactive bowel sounds  Ext: 1+ edema, embolic changes to left great toe, livedo reticularis of extremities  Recent Labs  Lab 04/20/2020 2322 04/15/2020 0918 05/01/20 1741 05/01/20 1741 05/02/20 0407 05/02/20 0407 05/03/20 0424 05/03/20 0424 05/04/20 0331 05/04/20 2000 05/05/20 0313 05/06/20 0053 05/06/20 0537 05/06/20 0545 05/06/20 1251  NA 135   < > 140   < > 135   < > 135   < > 136 135 135 137 137 139 139  K 3.5   < > 4.1   < > 4.5   < > 4.4   < > 3.8 3.3* 3.2* 3.2* 3.5 3.5 3.6  CL 103   < > 106   < > 101  --  98  --  100 99 98 99 99  --   --   CO2 22   < > 21*   < > 22  --  22  --  _0 21*  --   --   GLUCOSE 88   < > 111*   < > 128*  --  144*  --  143* 164* 111* 107* 123*  --   --   BUN 9   < > 19   < > 25*  --  32*  --  34* 33* 35* 33* 35*  --   --   CREATININE 1.20   < > 2.02*   < > 2.46*  --  2.92*  --  3.07* 3.00* 3.13* 3.06* 3.49*  --   --   ALBUMIN 1.3*  --  1.8*  --   --   --  1.4*  --  1.7*  --  1.5*  1.5*  1.6*  1.6*  --   --   --   CALCIUM 7.5*   < > 6.8*   < > 7.3*  --  6.9*  --  6.9* 7.0* 7.1* 7.6* 7.3*  --   --   PHOS  --   --   --   --   --   --   --   --   --   --  7.5* 6.7*  --   --   --   AST 22  --  3,257*  --   --   --  1,021*  --  480*  --  198* 118*  --   --   --   ALT 14  --  984*  --   --   --  686*  --  517*  --  343* 270*  --   --   --    < > = values in this interval not displayed.   Liver Function Tests: Recent Labs  Lab 05/04/20 0331 05/05/20 0313 05/06/20 0053  AST 480* 198* 118*  ALT 517* 343* 270*  ALKPHOS 348* 327* 316*  BILITOT 3.1* 3.2* 2.7*  PROT 5.9* 6.3* 6.9  ALBUMIN 1.7* 1.5*  1.5* 1.6*  1.6*   No results for input(s): LIPASE, AMYLASE in the last 168 hours. Recent Labs  Lab 05/03/20 0824  AMMONIA 43*   CBC: Recent Labs  Lab 05/02/20 0407 05/02/20 0407 05/03/20 0424 05/03/20 0424 05/04/20 0331 05/04/20 0331 05/05/20 0313 05/05/20 0313 05/06/20 0053 05/06/20 0053 05/06/20 0537 05/06/20 0545 05/06/20 1251  WBC 26.2*   < > 30.3*   < > 26.2*   < > 23.5*  --  22.5*  --  15.5*  --   --   NEUTROABS 22.9*  --   --   --   --   --  20.3*  --  19.8*  --   --   --   --   HGB 9.7*   < > 10.1*   < > 9.4*   < > 9.6*   < > 10.5*   < > 9.3* 10.2* 10.2*  HCT 29.1*   < > 31.1*   < > 28.9*   < > 28.8*   < > 32.1*   < > 28.1* 30.0* 30.0*  MCV 91.5   < > 92.6  --  92.3  --  90.9  --  90.4  --  93.4  --   --   PLT 279   < > 311   < > 286   < > 334  --  407*  --  338  --   --    < > = values in this interval not displayed.   Cardiac Enzymes: No results for input(s): CKTOTAL, CKMB, CKMBINDEX, TROPONINI in the last 168 hours. CBG: Recent Labs  Lab 05/05/20 2330 05/06/20 0311 05/06/20 0501 05/06/20 0717 05/06/20 1108  GLUCAP 109* 108* 111* 87 79    Iron Studies: No results for input(s): IRON, TIBC, TRANSFERRIN, FERRITIN in the last 72 hours. Studies/Results: DG Abd 1 View  Result Date: 05/06/2020 CLINICAL DATA:  Nasogastric intubation EXAM: ABDOMEN  - 1 VIEW COMPARISON:  05/04/2020 FINDINGS: Nasogastric tube is seen with its tip overlying the expected proximal body of the stomach. Multiple gas-filled loops of large and small bowel are seen within the visualized abdomen suggestive of an adynamic ileus. Pelvis and bilateral flanks are excluded from view. IMPRESSION: Nasogastric tube within the proximal body of the stomach. Electronically Signed   By: Fidela Salisbury MD   On: 05/06/2020 05:43   DG CHEST PORT 1 VIEW  Result Date: 05/06/2020 CLINICAL DATA:  Central placement. EXAM: PORTABLE CHEST 1 VIEW COMPARISON:  One-view chest x-ray 05/06/2020 FINDINGS: Heart is enlarged. Endotracheal tube is stable, 4 cm above the carina. Pacing wires are stable. Enteric tube courses off the inferior border the film. A new left IJ line is in place. The tip is at the cavoatrial junction. Interstitial edema has increased slightly. Bilateral pleural effusions are noted. Bilateral chest tubes are in place. No pneumothorax is present. IMPRESSION: 1. Interval placement of left IJ line with the tip at the cavoatrial junction. 2. Slight increase in interstitial edema. 3. Stable bilateral pleural effusions. 4. Stable support apparatus otherwise.  Electronically Signed   By: San Morelle M.D.   On: 05/06/2020 09:23   DG CHEST PORT 1 VIEW  Result Date: 05/06/2020 CLINICAL DATA:  Intubation EXAM: PORTABLE CHEST 1 VIEW COMPARISON:  Two days ago FINDINGS: Endotracheal tube with tip between the clavicular heads and carina. The left IJ sheath has been removed. There is an enteric tube which at least reaches the stomach. Direct pacer leads without battery pack. Chest tubes in place. Low volume chest with atelectasis/vascular congestion that is increased. Stable cardiopericardial enlargement. IMPRESSION: 1. Unremarkable hardware positioning. 2. Increased vascular congestion or atelectasis. No visible pneumothorax. Electronically Signed   By: Monte Fantasia M.D.   On: 05/06/2020  05:36   ECHOCARDIOGRAM LIMITED  Result Date: 05/06/2020    ECHOCARDIOGRAM LIMITED REPORT   Patient Name:   Jonathan Shaffer Date of Exam: 05/06/2020 Medical Rec #:  [id number]      Height:       75.0 in Accession #:    [id number]     Weight:       298.5 lb Date of Birth:  [birth year]     BSA:          2.602 m Patient Age:    49 years       BP:           103/53 mmHg Patient Gender: M              HR:           97 bpm. Exam Location:  Inpatient Procedure: Limited Echo Indications:    Pericardial effusion  History:        Patient has prior history of Echocardiogram examinations, most                 recent 04/13/2020. Risk Factors:Diabetes. S/P mitral valve                 repair. H/O endiocarditis of mitral valve. Bacteremia.  Sonographer:    Clayton Lefort RDCS (AE) Referring Phys: [id number] Candee Furbish  Sonographer Comments: Technically difficult study due to poor echo windows, suboptimal parasternal window, suboptimal apical window, patient is morbidly obese and echo performed with patient supine and on artificial respirator. Image acquisition challenging due to patient body habitus. IMPRESSIONS  1. The echo is technically difficult with poor image quality. I was initially concerned with possible akinesis of the inferobasal segments but with Definity contrast, the LV is seen to contract normally . Marland Kitchen Left ventricular ejection fraction, by estimation, is 60 to 65%. The left ventricle has normal function. The left ventricle demonstrates regional wall motion abnormalities (see scoring diagram/findings for description). There is severe akinesis of the left ventricular, basal inferior wall.  2. The aortic valve is grossly normal. Aortic valve regurgitation is not visualized. No aortic stenosis is present. FINDINGS  Left Ventricle: The echo is technically difficult with poor image quality. I was initially concerned with possible akinesis of the inferobasal segments but with Definity contrast, the LV is seen to contract  normally. Left ventricular ejection fraction, by estimation, is 60 to 65%. The left ventricle has normal function. The left ventricle demonstrates regional wall motion abnormalities. Severe akinesis of the left ventricular, basal inferior wall. Definity contrast agent was given IV to delineate the left ventricular endocardial borders. The left ventricular internal cavity size was normal in size. Aortic Valve: The aortic valve is grossly normal. Aortic valve regurgitation is not visualized. No aortic stenosis is present. Mertie Moores MD Electronically  signed by Mertie Moores MD Signature Date/Time: 05/06/2020/10:46:09 AM    Final    [place] EKG SITE RITE  Result Date: 05/06/2020 If Site Rite image not attached, placement could not be confirmed due to current cardiac rhythm.  Marland Kitchen acetaminophen (TYLENOL) oral liquid 160 mg/5 mL  500 mg Per Tube Q8H  . aspirin  162 mg Per Tube Daily  . bisacodyl  10 mg Oral Daily   Or  . bisacodyl  10 mg Rectal Daily  . chlorhexidine gluconate (MEDLINE KIT)  15 mL Mouth Rinse BID  . Chlorhexidine Gluconate Cloth  6 each Topical Daily  . hydrocortisone sod succinate (SOLU-CORTEF) inj  50 mg Intravenous Q6H  . insulin aspart  2-6 Units Subcutaneous Q4H  . insulin detemir  8 Units Subcutaneous Q12H  . lidocaine  1 patch Transdermal Daily  . [START ON 05/07/2020] loratadine  10 mg Per Tube Daily  . mouth rinse  15 mL Mouth Rinse 10 times per day  . metoCLOPramide (REGLAN) injection  10 mg Intravenous Q6H  . midazolam      . mupirocin ointment  1 application Nasal BID  . pantoprazole sodium  40 mg Per Tube Daily  . potassium chloride  40 mEq Per Tube Daily  . Warfarin - Pharmacist Dosing Inpatient   Does not apply q1600    BMET    Component Value Date/Time   NA 139 05/06/2020 1251   K 3.6 05/06/2020 1251   CL 99 05/06/2020 0537   CO2 21 (L) 05/06/2020 0537   GLUCOSE 123 (H) 05/06/2020 0537   BUN 35 (H) 05/06/2020 0537   CREATININE 3.49 (H) 05/06/2020 0537    CALCIUM 7.3 (L) 05/06/2020 0537   GFRNONAA 20 (L) 05/06/2020 0537   CBC    Component Value Date/Time   WBC 15.5 (H) 05/06/2020 0537   RBC 3.01 (L) 05/06/2020 0537   HGB 10.2 (L) 05/06/2020 1251   HCT 30.0 (L) 05/06/2020 1251   PLT 338 05/06/2020 0537   MCV 93.4 05/06/2020 0537   MCH 30.9 05/06/2020 0537   MCHC 33.1 05/06/2020 0537   RDW 18.4 (H) 05/06/2020 0537   LYMPHSABS 1.4 05/06/2020 0053   MONOABS 0.9 05/06/2020 0053   EOSABS 0.0 05/06/2020 0053   BASOSABS 0.1 05/06/2020 0053    Assessment/Plan: 1. AKI, non-oliguric- Unclear etiology, however he has had multiple renal insults with hypotension following surgery requiring pressors, multiple antibiotics, IV contrast, and diuretics with marked diuresis since his surgery. UA with moderate blood, trace leukocytes, negative protein. DDx includes ischemic ATN from hypotension/pressors, AIN from Nafcillin, immune mediated GN due to endocarditis, septic emboli to kidneys, or pre-renal azotemia due to diuresis and 3rd spacing.  Now with worsening renal function following respiratory arrest and shock from aspiration pna. 1. + urine eosinophils, however no eosinophilia on cbc with diff.  Nonspecific and could be due to AIN or atheroemboli to kidneys. Discussed with ID and if he has been treated appropriately for CNS infection, would recommend stopping Nafcillin and switching to IV ancef for osteo and septic arthritis for the remainder of his abx course. 2. Serologies to r/o acute GN are pending.  C3 low but normal C4 consistent with alternative pathway activation  3. Renal artery duplex without stenosis 4. Consider imaging kidneys with MRI to evaluate for possible septic emboli involvement (although not seen on CT of abd with contrast dated 05/01/2020). 5. Hold diuretics for now and follow UOP and daily Scr.  6. Renal dose Nafcillin and other meds  as his eGFR is <20 with rising BUN/Cr. Avoid nephrotoxic agents (NSAIDs/IV contrast, Fleets  enema). 2. VDRF following Acute respiratory arrest with aspiration - early this morning now with shock and on pressors per PCCM 3. 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 ID following and will require 6 weeks of IV abx. 4. S/p MVR 5. Sinus node dysfunction- has known atrial rhythm and may require PPM 6. DM type 2 7. Anemia of critical illness/ABLA following surgery- transfuse as needed 8. Acute CHF following sinus arrest 9. Rash- on torso, possible drug rash from Nafcillin. ID following and will let us know if he has been treated long enough for CNS involvement and if so will change to [place], MD [facility] [phone]

## 2020-05-06 NOTE — Progress Notes (Addendum)
Events overnight noted In review of chart and d/w RN, pt was respiratory arrest provoked by an aspiration event, not a cardiac arrest. Unable to see 2C tele, arrived to [place] in ST > settled to HRs 70's currently  D/w Dr. Lovena Le EP will remain available, please call if needed.  Tommye Standard, PA-C  Carleene Overlie Ladine Kiper,MD

## 2020-05-06 NOTE — Progress Notes (Signed)
Spoke with Dr. Cyndia Bent, order to insert NGT then get KUB and CXR.  Jonathan Shaffer, RR at bedside.  Attempted to place 14 Fr NGT unsuccessfully, pt vomiting during insertion attempt.  During 4th attempt, pt desat to 70's, bilious vomiting, and became unresponsive, no spontaneous respirations.  Code blue called, began bagging pt.  Initially patient had femoral pulse but within 1 minute lost pulse and began compressions.  Code team arrived.  ACLS protocol continued under instruction of resident.  Pt intubated, and NGT placed.  IO placed to Right tibia.  Dr. Cyndia Bent called and made aware of code.  ROSC achieved after 16 minutes and pt moved to 2H01.  Beside report given to [facility].

## 2020-05-06 NOTE — Progress Notes (Signed)
[place] for warfarin > heparin while intubated Indication: On-X mitral valve   No Known Allergies  Patient Measurements: Height: 6\' 3"  (190.5 cm) Weight: 135.4 kg (298 lb 8.1 oz) IBW/kg (Calculated) : 84.5  Vital Signs: Temp: 98.8 F (37.1 C) (12/03 0715) Temp Source: Axillary (12/03 0715) BP: 94/79 (12/03 0700) Pulse Rate: 84 (12/03 0815)  Labs: Recent Labs    05/04/20 0331 05/04/20 2000 05/05/20 0313 05/05/20 0313 05/06/20 0053 05/06/20 0053 05/06/20 0537 05/06/20 0545  HGB 9.4*  --  9.6*   < > 10.5*   < > 9.3* 10.2*  HCT 28.9*  --  28.8*   < > 32.1*  --  28.1* 30.0*  PLT 286  --  334  --  407*  --  338  --   LABPROT 42.2*  --  40.8*  --  26.2*  --   --   --   INR 4.6*  --  4.4*  --  2.5*  --   --   --   CREATININE 3.07*   < > 3.13*  --  3.06*  --  3.49*  --    < > = values in this interval not displayed.    Estimated Creatinine Clearance: 35.9 mL/min (A) (by C-G formula based on SCr of 3.49 mg/dL (H)).   Medical History: Past Medical History:  Diagnosis Date  . Cancer ([place])   . Diabetes mellitus without complication ([place])   . High cholesterol   . 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 Thyroid disease   . 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 Hgb stable post-op, currently at 9.4, plt wnl. No bleeding noted.   INR with significant increase yesterday from 2 to 4.6, now down slightly to 2.5. LFTs improving, CBC stable.   Goal of Therapy:  INR goal 2.5-3.5 Monitor platelets by anticoagulation protocol: Yes   Plan:  Hold warfarin for now while intubated Per discussion with CCM - will start IV heparin when INR < 2 Recheck INR this afternoon. Resume warfarin as able  [place] Crane, Vena Austria, BCPS, [facility] Clinical Pharmacist  05/06/2020 8:54 AM   [facility] pharmacy phone numbers are listed on [email or web address]

## 2020-05-06 NOTE — Progress Notes (Signed)
6 Days Post-Op Procedure(s) (LRB): MITRAL VALVE (MV) REPLACEMENT USING ON-X MITRAL VALVE SIZE 31/33 MM,  PERMANENT EPICARDIAL PACEMAKING SYSTEM IMPLANT USING MEDTRONIC LEADS (N/A) TRANSESOPHAGEAL ECHOCARDIOGRAM (TEE) (N/A) Subjective: Serous sternal drainage after CPR for respiratory arrest Skin/ soft tissue split apart in sternal wound Sternal wires intact on exam and post intubation CXR Wound VAC pump not available Sterile wet/dry dressing placed and covered with [place] Objective: Vital signs in last 24 hours: Temp:  [97.6 F (36.4 C)-100 F (37.8 C)] 100 F (37.8 C) (12/03 1113) Pulse Rate:  [69-184] 96 (12/03 1030) Cardiac Rhythm: Normal sinus rhythm (12/03 0600) Resp:  [16-33] 26 (12/03 1030) BP: (75-185)/(51-107) 103/53 (12/03 0745) SpO2:  [75 %-100 %] 91 % (12/03 1148) Arterial Line BP: (74-154)/(40-84) 128/64 (12/03 1030) FiO2 (%):  [100 %] 100 % (12/03 1148) Weight:  [135.1 kg-135.4 kg] 135.4 kg (12/03 0600)  Hemodynamic parameters for last 24 hours:    Intake/Output from previous day: 12/02 0701 - 12/03 0700 In: 1061.4 [P.O.:120; I.V.:1.7; NG/GT:30; IV Piggyback:909.7] Out: 0300 [Urine:4250; Emesis/NG output:475; Chest Tube:410] Intake/Output this shift: Total I/O In: 1384.1 [I.V.:308.8; IV Piggyback:1075.3] Out: 218 [Urine:18; Chest Tube:200]  Intubated, sedated F1O2 100% , arterial sat 92% Post arrest echo- normal LV function  Lab Results: Recent Labs    05/06/20 0053 05/06/20 0053 05/06/20 0537 05/06/20 0545  WBC 22.5*  --  15.5*  --   HGB 10.5*   < > 9.3* 10.2*  HCT 32.1*   < > 28.1* 30.0*  PLT 407*  --  338  --    < > = values in this interval not displayed.   BMET:  Recent Labs    05/06/20 0053 05/06/20 0053 05/06/20 0537 05/06/20 0545  NA 137   < > 137 139  K 3.2*   < > 3.5 3.5  CL 99  --  99  --   CO2 24  --  21*  --   GLUCOSE 107*  --  123*  --   BUN 33*  --  35*  --   CREATININE 3.06*  --  3.49*  --   CALCIUM 7.6*  --  7.3*  --     < > = values in this interval not displayed.    PT/INR:  Recent Labs    05/06/20 0053  LABPROT 26.2*  INR 2.5*   ABG    Component Value Date/Time   PHART 7.319 (L) 05/06/2020 0545   HCO3 24.1 05/06/2020 0545   TCO2 25 05/06/2020 0545   ACIDBASEDEF 2.0 05/06/2020 0545   O2SAT 67.0 05/06/2020 1054   CBG (last 3)  Recent Labs    05/06/20 0501 05/06/20 0717 05/06/20 1108  GLUCAP 111* 87 79    Assessment/Plan: S/P Procedure(s) (LRB): MITRAL VALVE (MV) REPLACEMENT USING ON-X MITRAL VALVE SIZE 31/33 MM,  PERMANENT EPICARDIAL PACEMAKING SYSTEM IMPLANT USING MEDTRONIC LEADS (N/A) TRANSESOPHAGEAL ECHOCARDIOGRAM (TEE) (N/A) Plan placement of wound vac on  open superficial sternal wound when pump available to remove  tissue serous drainage and maintain sterility   LOS: 14 days    Jonathan Shaffer 05/06/2020

## 2020-05-06 NOTE — Progress Notes (Signed)
Pharmacy Antibiotic Note  Jonathan Shaffer is a 54 y.o. male s/p cardiac arrest, VDRF and aspiration .  Pharmacy has been consulted for Zosyn dosing.  Pt is currently receiving Nafcillin for disseminated MSSA infection.    Plan: Zosyn 3.375 g IV q8h   Height: 6\' 3"  (190.5 cm) Weight: 135.1 kg (297 lb 13.5 oz) IBW/kg (Calculated) : 84.5  Temp (24hrs), Avg:98.3 F (36.8 C), Min:97.6 F (36.4 C), Max:99.9 F (37.7 C)  Recent Labs  Lab 05/02/20 0407 05/02/20 0407 05/03/20 0424 05/04/20 0331 05/04/20 2000 05/05/20 0313 05/06/20 0053  WBC 26.2*  --  30.3* 26.2*  --  23.5* 22.5*  CREATININE 2.46*   < > 2.92* 3.07* 3.00* 3.13* 3.06*   < > = values in this interval not displayed.    Estimated Creatinine Clearance: 40.9 mL/min (A) (by C-G formula based on SCr of 3.06 mg/dL (H)).    No Known Allergies  11/19 Zosyn x 1 11/19 cefepime>>11/20 11/19 vancomycin>>11/20 11/20 nafcillin >> (6 wks min from 11/23) 12/3 Zosyn >>    11/19 MRSA PCR neg 11/19 Bcx: 4/4 MSSA 11/19 Ucx: MSSA 11/21 Bcx: negF 11/22 abscess tissue: MSSA 11/23 Bcx: negF 11/27 MV vegetation: rare staph aureus  Caryl Pina 05/06/2020 5:32 AM

## 2020-05-06 NOTE — Progress Notes (Signed)
PT Cancellation Note  Patient Details Name: [name] MRN: [id number] DOB: 07/13/1965   Cancelled Treatment:    Reason Eval/Treat Not Completed: (P) Medical issues which prohibited therapy (Pt will medical decline overnight due to aspiration leading to PEA and 13 min of CPR.  At this time patient is not appropriate for PT services.  Will f/u per POC.)   [name] 05/06/2020, 11:49 AM  Erasmo Leventhal , PTA Acute Rehabilitation Services Pager [phone] Office [phone]

## 2020-05-06 NOTE — Code Documentation (Signed)
  Patient Name: Jonathan Shaffer   MRN: [id number]   Date of Birth/ Sex: Jun 11, 1965 , male      Admission Date: 04/04/2020  Attending Provider: Wonda Olds, MD  Primary Diagnosis: Abscess of right shoulder   Indication: Pt was in his usual state of health until this AM, when he was noted to have aspirated and then became apneic. Code blue was subsequently called. At the time of arrival on scene, ACLS protocol was underway.   Technical Description:  - CPR performance duration:  12 minutes  - Was defibrillation or cardioversion used? No   - Was external pacer placed? No  - Was patient intubated pre/post CPR? Yes, during CPR   Medications Administered: Y = Yes; Blank = No Amiodarone    Atropine    Calcium    Epinephrine  Y  Lidocaine    Magnesium    Norepinephrine    Phenylephrine    Sodium bicarbonate  Y  Vasopressin     Post CPR evaluation:  - Final Status - Was patient successfully resuscitated ? Yes - What is current rhythm? NSR - What is current hemodynamic status? Stable  Miscellaneous Information:  - Labs sent, including: None  - Primary team notified?  Yes  - Family Notified? Primary bedside, per primary  - Additional notes/ transfer status: NA     Virl Axe, MD  05/06/2020, 4:22 AM

## 2020-05-06 NOTE — Progress Notes (Signed)
   05/06/20 0400  Clinical Encounter Type  Visited With Patient not available  Visit Type Initial  Referral From Nurse  Consult/Referral To Chaplain  The chaplain responded to code blue. No family present. The chaplain was present for the duration of the code.The patient will be moved to 2H01. The chaplain will follow up as needed.

## 2020-05-06 NOTE — Progress Notes (Signed)
Was asked to reevaluate patient as he was unable to tolerate compression socks.  Unfortunately patient has had a respiratory arrest this morning is now intubated.  Wife is at his bedside and tearful.  She said that he just needed a break from the socks that he did not have any significant increase in pain.  Examination of his feet unchanged from Dr. Jess Barters previous assessment he has a  gangrenous great toe with a with a ulcer on the plantar surface.  Right foot mild erythema of the fifth toe.  No other gangrenous changes noted

## 2020-05-06 NOTE — Progress Notes (Signed)
NAME:  Jonathan Shaffer, MRN:  [id number], DOB:  February 26, 1966, LOS: 71 ADMISSION DATE:  04/23/2020, CONSULTATION DATE:  04/23/20 REFERRING MD:  Karleen Hampshire, CHIEF COMPLAINT:  Septic shock  Brief History   MSSA bacteremia +/- AC septic arthritis, septic emboli to spleen  History of present illness   Jonathan Shaffer is a 54 y/o gentleman admitted with sepsis, found to have MSSA bacteremia. He developed sudden onset nausea and vomiting and chills last week and later developed fevers and R shoulder pain. He was evaluated and felt to have a sprain. He was treated with an OP steroid injection and started oral steroids. He developed a painful rash on his hand. His progressive malaise, fevers, rash, and joint pain prompted return to the ED. At presentation he was febrile, tachycardic, tachypneic, with lactic acidosis. He was in DKA at presentation. He has a previous history of "high diabetes markers", which he reports he was not following up on. He was started on empiric vanc, zosyn & cefepime, and doxycycline. He has been evaluated by orthopaedics for possible septic arthritis of his R AC joint. ID has been consulted for MSSA+ blood cultures. No known history of IVDU or injuries prior to becoming ill. He has been encephalopathic this admission, prompting concern for septic emboli to the brain; MRI pending.  Past Medical History  Uncontrolled DM Testicular cancer; in remission [facility] Events   Started vasopressors 11/20 MV replacement 11/27 12/2 aspiration event  Consults:  ID Ortho PCCM TCTS Nephrology  Procedures:  PICC  Mechanical MV replacement 11/27  Significant Diagnostic Tests:  MRI R shoulder> septic arthritis AC joint, possible intramuscular abscess, OM distal clavicle Echocardiogram 11/19> LVEF 55 to 60%, normal valves. LUE US> no DVT MRI left wrist> cellulitis, no septic arthritis  Micro Data:  Blood culture 11/19> MSSA  2/3 Urine culture 11/19> staph aureus Blood  culture 11/21> NG Shoulder culture 11/22> abundant MSSA Blood cultuers 11/23> NG Operative cultures 11/22> abundant WBC> rare MSSA  Antimicrobials:  Vanc 11/20 Zosyn 11/19 Cefepime 11/19- 11/20 Doxycycline 11/19 Nafcillin 11/20>  Interim history/subjective:  Aspirated early AM leading to 13 min witnessed PEA arrest. Intubated/sedated.  Objective   Blood pressure 94/79, pulse 84, temperature 98.8 F (37.1 C), temperature source Axillary, resp. rate (!) 24, height 6\' 3"  (1.905 m), weight 135.4 kg, SpO2 90 %.    Vent Mode: PRVC FiO2 (%):  [100 %] 100 % Set Rate:  [16 bmp] 16 bmp Vt Set:  [670 mL] 670 mL PEEP:  [5 cmH20] 5 cmH20 Plateau Pressure:  [34 cmH20] 34 cmH20   Intake/Output Summary (Last 24 hours) at 05/06/2020 0847 Last data filed at 05/06/2020 0800 Gross per 24 hour  Intake 1590.69 ml  Output 5149 ml  Net -3558.31 ml   Filed Weights   05/05/20 0500 05/06/20 0320 05/06/20 0600  Weight: (!) 136.9 kg 135.1 kg 135.4 kg    Examination: Constitutional: ill appearing man on ventilator  Eyes: pupils pinpoint, reactive Ears, nose, mouth, and throat: ETT in place, minimal secretions Cardiovascular: RRR, ext warm Respiratory: rhonci bilaterally, +accessory muscle use Gastrointestinal: distended, tympanic to percussion, NGT with bilious output Skin: left foot ischemic changes stable Neurologic: not withdrawing for me Psychiatric: RASS -4  Labs actually look improving.  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  - TCTS,  ortho, ID following - Zosyn should be fine but defer to ID - DC warfarin, resume heparin gtt once INR < 2  Acute respiratory failure post-operatively due to acute pulmonary edema, atelectasis, bilateral effusion- improving Aspiration event  leading to profound hypoxemia 12/4 - Sedate further, start iNO, PRN paralytics - Aggressive PEEP and low tidal volumes trying to limit driving pressures while maintaining reasonable pH  In hospital PEA arrest, severe septic shock- suspicion for aspiration given enlarging abdomen - Levo/vaso titrated to MAP 65 - Start stress steroids - Check co-ox, stat echo  AKI- still an issue, hypoxemia takes precedence for now  DM with hyperglycemia- controlled; prev in DKA. A1c 11.3 -Levemir, SSI as ordered, watch closely as now NPO and starting stress steroids  Shock liver- improving, trend  Ileus- NGT to suction, reglan for now, daily exams  Best practice:  Diet: NPO Pain/Anxiety/Delirium protocol (if indicated): prop/fent titrated to vent synchrony VAP protocol (if indicated): in place DVT prophylaxis: warfarin/heparin GI prophylaxis: PPI Glucose control: levemir and SSI Mobility: BR Code Status: full Family Communication: updated wife at bedside Disposition: ICU   Patient critically ill due to severe shock, severe hypoxemic respiratory failure Interventions to address this today vent support, pressors, antibiotics, inhaled nitric oxide Risk of deterioration without these interventions is high  I personally spent 85 minutes providing critical care not including any separately billable procedures  Erskine Emery MD [facility] 05/06/2020 9:25 AM Personal pager: [phone] If unanswered, please page CCM On-call: [phone]

## 2020-05-06 NOTE — Progress Notes (Signed)
Pt c/o nausea, 100 cc bilious emesis noted.  Zofran given.  Pt noted to desat to 80's on RA, mouth breathing.  Attempted nasal cannula but unable to hold sats due to mouth breathing.  Venti mask placed.  Sats up to 90%.  At approx 0315 pt began vomiting bilious emesis.  Desating to 80's.  Abd distended and taut, very hypo active bowel signs.  Rapid response notified.  Will continue to monitor and page Dr. Cyndia Bent as needed.

## 2020-05-06 NOTE — Progress Notes (Signed)
TCTS BRIEF SICU PROGRESS NOTE  6 Days Post-Op  S/P Procedure(s) (LRB): MITRAL VALVE (MV) REPLACEMENT USING ON-X MITRAL VALVE SIZE 31/33 MM,  PERMANENT EPICARDIAL PACEMAKING SYSTEM IMPLANT USING MEDTRONIC LEADS (N/A) TRANSESOPHAGEAL ECHOCARDIOGRAM (TEE) (N/A)   Sedated on vent NSR w/ stable BP on vasopressin and levophed drips O2 sats 99-100% Remains oliguric  Plan: Continue current plan  Rexene Alberts, MD 05/06/2020 5:33 PM

## 2020-05-06 NOTE — Procedures (Signed)
Central Venous Catheter Insertion Procedure Note  DURWOOD DITTUS  [id number]  11/20/1965  Date:05/06/20  Time:4:53 PM   Provider Performing:Mana Haberl Loletha Grayer Tamala Julian   Procedure: Insertion of Non-tunneled Central Venous [phone]) with US guidance ([id number])   Indication(s) Medication administration  Consent Risks of the procedure as well as the alternatives and risks of each were explained to the patient and/or caregiver.  Consent for the procedure was obtained and is signed in the bedside chart  Anesthesia Topical only with 1% lidocaine   Timeout Verified patient identification, verified procedure, site/side was marked, verified correct patient position, special equipment/implants available, medications/allergies/relevant history reviewed, required imaging and test results available.  Sterile Technique Maximal sterile technique including full sterile barrier drape, hand hygiene, sterile gown, sterile gloves, mask, hair covering, sterile ultrasound probe cover (if used).  Procedure Description Area of catheter insertion was cleaned with chlorhexidine and draped in sterile fashion.  With real-time ultrasound guidance a central venous catheter was placed into the left internal jugular vein. Nonpulsatile blood flow and easy flushing noted in all ports.  The catheter was sutured in place and sterile dressing applied.  Complications/Tolerance None; patient tolerated the procedure well. Chest X-ray is ordered to verify placement for internal jugular or subclavian cannulation.   Chest x-ray is not ordered for femoral cannulation.  EBL Minimal  Specimen(s) None

## 2020-05-06 NOTE — Progress Notes (Signed)
[facility] for Infectious Disease  Date of Admission:  04/17/2020      Total days of antibiotics 15  Nafcillin 11/20 >>   Piptazo 12/3>>            ASSESSMENT: Jonathan Shaffer is a 54 y.o. male with disseminated MSSA infection including mitral valve endocarditis (s/p replacement, complicated by CNS/Splenic emboli), R shoulder abscess (s/p debridement), ischemic foot.   POD 6 today.  Overnight events noted with aspiration event leading to witnessed PEA arrest.  ROSC after 13 min CPR.  Started on Pip tazo due to concern for aspiration.  Now intubated/sedated back in the ICU.  WBC with overall downward trend.  Creatinine up some today post-arrest.  LFTs trending down.   PLAN: 1. Continue nafcillin for now.  Hopeful to get 2 full weeks of coverage through 12/5.  Bump in creatinine noted today but multifactorial given arrest overnight.  If creatinine continues to rise tmrw, anticipate changing coverage 2. Can DC zosyn to avoid dual beta lactam.  Likely chemical pneumonitis.  Will add Flagyl for anaerobic coverage x 5 days 3. Holding on PPM generator for now (epicardial system) 4. Follow progression of feet, fever and WBC curve 5. Hold on addition of gent / rifampin  6. Monitor LFTs   Principal Problem:   Abscess of right shoulder Active Problems:   Severe sepsis (HCC)   Splenic infarct   Diabetic acidosis without coma (HCC)   AKI (acute kidney injury) ([place])   Endocarditis of mitral valve   Bacteremia   S/P MVR (mitral valve repair)   Embolic disease of toe (HCC)   Venous insufficiency (chronic) (peripheral)   History of open heart surgery   Foot lesion   . acetaminophen  500 mg Oral Q8H  . aspirin  150 mg Rectal Daily  . bisacodyl  10 mg Oral Daily   Or  . bisacodyl  10 mg Rectal Daily  . chlorhexidine gluconate (MEDLINE KIT)  15 mL Mouth Rinse BID  . Chlorhexidine Gluconate Cloth  6 each Topical Daily  . feeding supplement  237 mL Oral BID BM  .  hydrocortisone sod succinate (SOLU-CORTEF) inj  50 mg Intravenous Q6H  . insulin aspart  2-6 Units Subcutaneous Q4H  . insulin detemir  8 Units Subcutaneous Q12H  . lidocaine  1 patch Transdermal Daily  . loratadine  10 mg Oral Daily  . mouth rinse  15 mL Mouth Rinse 10 times per day  . metoCLOPramide (REGLAN) injection  10 mg Intravenous Q6H  . midazolam      . multivitamin with minerals  1 tablet Oral Daily  . mupirocin ointment  1 application Nasal BID  . pantoprazole (PROTONIX) IV  40 mg Intravenous QHS  . potassium chloride  40 mEq Oral Daily  . sodium bicarbonate      . sodium bicarbonate  50 mEq Intravenous Once  . sodium bicarbonate  50 mEq Intravenous Once  . Warfarin - Pharmacist Dosing Inpatient   Does not apply q1600    SUBJECTIVE: Unable to obtain due to intubated.    Review of Systems: Review of Systems  Unable to perform ROS: Intubated    No Known Allergies  OBJECTIVE: Vitals:   05/06/20 0745 05/06/20 0800 05/06/20 0815 05/06/20 0830  BP: (!) 103/53     Pulse: 74 81 84   Resp: (!) 21 (!) 29 (!) 33 (!) 24  Temp:      TempSrc:  SpO2: (!) 88% (!) 85% 90%   Weight:      Height:       Body mass index is 37.31 kg/m.  Physical Exam Constitutional:      Appearance: He is well-developed.     Comments: Intubated, sedated.  HENT:     Head: Normocephalic and atraumatic.     Comments: ET tube in place. Left CVC NG tube.    Mouth/Throat:     Dentition: Normal dentition. No dental abscesses.  Pulmonary:     Comments: Intubated on vent.  Symmetric chest rise and fall.  Chest:     Comments: Midsternal incision dressed and clean.  Abdominal:     General: There is distension.  Musculoskeletal:     Comments: Feet wrapped in compression wraps  Lymphadenopathy:     Cervical: No cervical adenopathy.  Skin:    General: Skin is warm and dry.  Neurological:     Comments: Sedated.      Lab Results Lab Results  Component Value Date   WBC 15.5 (H)  05/06/2020   HGB 10.2 (L) 05/06/2020   HCT 30.0 (L) 05/06/2020   MCV 93.4 05/06/2020   PLT 338 05/06/2020    Lab Results  Component Value Date   CREATININE 3.49 (H) 05/06/2020   BUN 35 (H) 05/06/2020   NA 139 05/06/2020   K 3.5 05/06/2020   CL 99 05/06/2020   CO2 21 (L) 05/06/2020    Lab Results  Component Value Date   ALT 270 (H) 05/06/2020   AST 118 (H) 05/06/2020   ALKPHOS 316 (H) 05/06/2020   BILITOT 2.7 (H) 05/06/2020     Microbiology: Recent Results (from the past 240 hour(s))  Culture, blood (Routine X 2) w Reflex to ID Panel     Status: None   Collection Time: 04/26/20  9:58 AM   Specimen: BLOOD RIGHT HAND  Result Value Ref Range Status   Specimen Description   Final    BLOOD RIGHT HAND Performed at [facility], [place] [address], [place], [place] [address]    Special Requests   Final    BOTTLES DRAWN AEROBIC ONLY Blood Culture results may not be optimal due to an inadequate volume of blood received in culture bottles Performed at [facility] [address], [place], [place] [address]    Culture   Final    NO GROWTH 5 DAYS Performed at [facility], [place] [address], [place], [place] [address]    Report Status 05/01/2020 FINAL  Final  Culture, blood (Routine X 2) w Reflex to ID Panel     Status: None   Collection Time: 04/26/20  9:58 AM   Specimen: BLOOD RIGHT HAND  Result Value Ref Range Status   Specimen Description   Final    BLOOD RIGHT HAND Performed at [facility] [address] [place], [place] [address]    Special Requests   Final    BOTTLES DRAWN AEROBIC ONLY Blood Culture results may not be optimal due to an inadequate volume of blood received in culture bottles Performed at [facility] [address], [place], [place] [address]    Culture   Final    NO GROWTH 5 DAYS Performed at [facility], [place] [address], [place],  [address]    Report Status  05/01/2020 FINAL  Final  Surgical pcr screen     Status: Abnormal   Collection Time: 05/02/2020  2:39  AM   Specimen: Nasal Mucosa; Nasal Swab  Result Value Ref Range Status   MRSA, PCR NEGATIVE NEGATIVE Final   Staphylococcus aureus POSITIVE (A) NEGATIVE Final    Comment: (NOTE) The Xpert SA Assay (FDA approved for NASAL specimens in patients 65 years of age and older), is one component of a comprehensive surveillance program. It is not intended to diagnose infection nor to guide or monitor treatment. Performed at [facility], [place] [address], [place], [place] [address]   Aerobic/Anaerobic Culture (surgical/deep wound)     Status: None   Collection Time: 04/06/2020 11:01 AM   Specimen: Other Source  Result Value Ref Range Status   Specimen Description TISSUE  Final   Special Requests   Final    MITRAL VALVE VEGETATION SPEC A ANTIBIOTICS ZINACEF VANCOMYCIN   Gram Stain   Final    ABUNDANT WBC PRESENT, PREDOMINANTLY PMN NO ORGANISMS SEEN    Culture   Final    RARE STAPHYLOCOCCUS AUREUS CRITICAL RESULT CALLED TO, READ BACK BY AND VERIFIED WITH: RN Mellissa Kohut [id number] AT 1212 BY CM NO ANAEROBES ISOLATED Performed at [facility], [place] [address] [place], [place] [address]    Report Status 05/05/2020 FINAL  Final   Organism ID, Bacteria STAPHYLOCOCCUS AUREUS  Final      Susceptibility   Staphylococcus aureus - MIC*    CIPROFLOXACIN <=0.5 SENSITIVE Sensitive     ERYTHROMYCIN <=0.25 SENSITIVE Sensitive     GENTAMICIN <=0.5 SENSITIVE Sensitive     OXACILLIN 0.5 SENSITIVE Sensitive     TETRACYCLINE <=1 SENSITIVE Sensitive     VANCOMYCIN 1 SENSITIVE Sensitive     TRIMETH/SULFA <=10 SENSITIVE Sensitive     CLINDAMYCIN <=0.25 SENSITIVE Sensitive     RIFAMPIN <=0.5 SENSITIVE Sensitive     Inducible Clindamycin NEGATIVE Sensitive     * RARE STAPHYLOCOCCUS AUREUS    Raynelle High[facility] for Infectious [facility] 05/06/2020, 9:52 AM   I  have spent a total of  35 minutes with the patient reviewing hospital notes,  test results, labs and examining the patient as well as establishing an assessment and plan.

## 2020-05-06 NOTE — Progress Notes (Signed)
6 Days Post-Op Procedure(s) (LRB): MITRAL VALVE (MV) REPLACEMENT USING ON-X MITRAL VALVE SIZE 31/33 MM,  PERMANENT EPICARDIAL PACEMAKING SYSTEM IMPLANT USING MEDTRONIC LEADS (N/A) TRANSESOPHAGEAL ECHOCARDIOGRAM (TEE) (N/A) Subjective:  Called by nurse this am that he had abdominal distension and complained of nausea, vomited 100 cc of bilious fluid. Desaturation to 80's on RA and would not maintain sats with [place] due to mouth breathing so put on venti mask with improvement of sats to 90%. Abdomen reported to be very distended with no BS. He had been hemodynamically stable. NG tube ordered but when placing he had respiratory arrest, possibly from aspiration and code blue called. He was intubated by CRNA, had CPR for 14 minutes, 3 epi and 1 bicarb with return of pulse and BP. NG inserted after intubation and about 1000 cc of bilious fluid removed. Transferred to [facility] intubated and hemodynamically stable.  Objective: Vital signs in last 24 hours: Temp:  [97.6 F (36.4 C)-98.2 F (36.8 C)] 98.1 F (36.7 C) (12/03 0320) Pulse Rate:  [84-95] 94 (12/03 0320) Cardiac Rhythm: Other (Comment);Ventricular tachycardia (12/03 0402) Resp:  [17-23] 22 (12/03 0320) BP: (127-156)/(80-96) 141/92 (12/03 0320) SpO2:  [89 %-96 %] 89 % (12/03 0320) Weight:  [135.1 kg] 135.1 kg (12/03 0320)  Hemodynamic parameters for last 24 hours:    Intake/Output from previous day: 12/02 0701 - 12/03 0700 In: 202.2 [P.O.:120; IV Piggyback:82.2] Out: 4560 [Urine:4250; Chest Tube:310] Intake/Output this shift: Total I/O In: 120 [P.O.:120] Out: 650 [Urine:650]  General appearance: intubated on vent Neurologic: has not woken up yet after arrest Heart: regular rate and rhythm, S1, S2 normal, no murmur Lungs: clear to auscultation bilaterally Abdomen: no BS, very distended and tight. Extremities:  mild anasarca Wound: chest incision with slight separation following CPR.  Lab Results: Recent Labs    05/05/20 0313  05/06/20 0053  WBC 23.5* 22.5*  HGB 9.6* 10.5*  HCT 28.8* 32.1*  PLT 334 407*   BMET:  Recent Labs    05/05/20 0313 05/06/20 0053  NA 135 137  K 3.2* 3.2*  CL 98 99  CO2 24 24  GLUCOSE 111* 107*  BUN 35* 33*  CREATININE 3.13* 3.06*  CALCIUM 7.1* 7.6*    PT/INR:  Recent Labs    05/06/20 0053  LABPROT 26.2*  INR 2.5*   ABG    Component Value Date/Time   PHART 7.419 04/29/2020 1823   HCO3 18.8 (L) 05/03/2020 1823   TCO2 20 (L) 04/06/2020 1823   ACIDBASEDEF 5.0 (H) 04/05/2020 1823   O2SAT 97.0 04/05/2020 1823   CBG (last 3)  Recent Labs    05/05/20 1948 05/05/20 2330 05/06/20 0311  GLUCAP 158* 109* 108*    Assessment/Plan: S/P Procedure(s) (LRB): MITRAL VALVE (MV) REPLACEMENT USING ON-X MITRAL VALVE SIZE 31/33 MM,  PERMANENT EPICARDIAL PACEMAKING SYSTEM IMPLANT USING MEDTRONIC LEADS (N/A) TRANSESOPHAGEAL ECHOCARDIOGRAM (TEE) (N/A)  Probably postop ileus with vomiting, probable aspiration with respiratory arrest, rapidly resuscitated. SBP 90's and likely intravascularly dry due to volume loss into gut. Will give him some albumin and IVF. Check KUB, CXR, ABG, BMET, CBC.  He is on Nafcillin for his endocarditis. Will add Zosyn for aspiration pneumonia.   Will likely need TPN.   LOS: 14 days    Gaye Pollack 05/06/2020

## 2020-05-06 NOTE — Progress Notes (Addendum)
  Echocardiogram 2D Echocardiogram has been performed.  Jonathan Shaffer 05/06/2020, 9:47 AM

## 2020-05-06 NOTE — Anesthesia Procedure Notes (Addendum)
Procedure Name: Intubation Date/Time: 05/06/2020 4:13 AM Performed by: Josephine Igo, CRNA Pre-anesthesia Checklist: Patient being monitored Ventilation: Mask ventilation with difficulty Grade View: Grade I Number of attempts: 2 Airway Equipment and Method: Stylet and Video-laryngoscopy Placement Confirmation: ETT inserted through vocal cords under direct vision and CO2 detector Secured at: 24 cm Tube secured with: Tape Dental Injury: Teeth and Oropharynx as per pre-operative assessment  Comments: Responded to Code Blue call. Pt. Asystolic- CPR in progress.  RT present- ventilated with AMBU. Active, profuse emesis. DL x 1 unsuccessful; Glide Scope laryngoscopy- intubation. Oral ETT 7.5 Cuff inflated. +ETCO2. ETT suction. Ventilated AMBU-ETT by RT. Ready for trans port to 2-H.

## 2020-05-06 NOTE — Progress Notes (Signed)
Nutrition Follow-up  DOCUMENTATION CODES:   Obesity unspecified  INTERVENTION:   - Recommend TPN for nutrition support  - d/c liquid MVI and Ensure Enlive  NUTRITION DIAGNOSIS:   Inadequate oral intake related to decreased appetite as evidenced by per patient/family report, meal completion < 50%.  Ongoing  GOAL:   Patient will meet greater than or equal to 90% of their needs  Unmet at this time  MONITOR:   Vent status, Labs, Weight trends, Skin, I & O's  REASON FOR ASSESSMENT:   Ventilator Poor PO, Calorie Count  ASSESSMENT:   Pt admitted with right shoulder pain, septic arthritis. Found to have mitral valve endocarditis due to MSSA bacteremia. MVR and temp pacemaker on 11/27. PMH of thyroid disease and testicular cancer. Pt with new onset of DM2.  12/03 - Code Blue due to respiratory arrest, intubated  Pt with multiple episodes of vomiting this AM. NGT placed with return of 1000 ml bilious output per notes. Pt with ileus and NGT is to suction. Pt also with severe septic shock.  Recommend initiation of TPN as pt was eating very poorly over the last several days (meal completions 0-25% since 11/29). Last good PO intake was on 11/25 with meal completion of 100%. Discussed recommendation with CCM who would like to hold off on starting TPN at this time.  Admit weight: 126.8 kg Current weight: 135.4 kg EDW: 120.2 kg  Pt with +1 pitting generalized edema.  Patient is currently intubated on ventilator support MV: 11.2 L/min Temp (24hrs), Avg:98.6 F (37 C), Min:97.6 F (36.4 C), Max:100 F (37.8 C) BP (a-line): 97/56 MAP (a-line): 69  Drips: Propofol: 16.3 ml/hr (provides 430 kcal from lipid daily) Precedex: off this AM Plasmalyte: 150 ml/hr Fentanyl: 5 ml/hr Levophed: 93.8 ml/hr Vasopressin: 12 ml/hr  Medications reviewed and include: dulcolax, solu-cortef, SSI q 4 hours, levemir 8 units q 12 hours, IV Reglan 10 mg q 6 hours, IV protonix, klor-con 40 mEq  daily, liquid MVI, warfarin, IV albumin, IV abx  Labs reviewed: BUN 35, creatinine 3.49, ionized calcium 1.00, elevated LFTs CBG's: 79-182 x 24 hours  UOP: 4250 ml x 24 hours NGT OP: 475 ml x 12 hours CT: 410 ml x 24 hours I/O's: +265 ml since admit  Diet Order:   Diet Order            Diet NPO time specified  Diet effective now                 EDUCATION NEEDS:   Education needs have been addressed  Skin:  Skin Assessment: Skin Integrity Issues: Incisions: right shoulder, chest  Last BM:  05/05/20 medium type 7  Height:   Ht Readings from Last 1 Encounters:  04/22/2020 6\' 3"  (1.905 m)    Weight:   Wt Readings from Last 1 Encounters:  05/06/20 135.4 kg    Ideal Body Weight:  89 kg  BMI:  Body mass index is 37.31 kg/m.  Estimated Nutritional Needs:   Kcal:  2477  Protein:  175-195 grams  Fluid:  2 L/day    Jonathan Bryant, MS, RD, LDN Inpatient Clinical Dietitian Please see [facility] for contact information.

## 2020-05-06 NOTE — Progress Notes (Signed)
This chaplain responded to a spiritual care referral for prayer.  The Pt. wife-Angela is bedside with a friend for support.    The chaplain listens as Laurena Bering shares the sequence of medical events bringing the Pt. to the ICU.  The chaplain understands Jonathan Shaffer is relying on strength from her faith/prayer  to navigate the Pt. healthcare alongside with the responsibilities of family.   The chaplain understands Jonathan Shaffer recognizes the importance of communication. Jonathan Shaffer asked her friend to help with family communication.  The same friend is joining Jonathan Shaffer as an extra set of listening ears with the healthcare team.  Jonathan Shaffer hopes to have the opportunity for the Pt. mother and adult children to be present for a bedside visit.    Jonathan Shaffer accepted the chaplain's invitation for prayer and F/U spiritual care.

## 2020-05-07 ENCOUNTER — Inpatient Hospital Stay (HOSPITAL_COMMUNITY): Payer: BC Managed Care – PPO

## 2020-05-07 DIAGNOSIS — I059 Rheumatic mitral valve disease, unspecified: Secondary | ICD-10-CM | POA: Diagnosis not present

## 2020-05-07 DIAGNOSIS — L02413 Cutaneous abscess of right upper limb: Secondary | ICD-10-CM | POA: Diagnosis not present

## 2020-05-07 DIAGNOSIS — N179 Acute kidney failure, unspecified: Secondary | ICD-10-CM | POA: Diagnosis not present

## 2020-05-07 LAB — POCT I-STAT 7, (LYTES, BLD GAS, ICA,H+H)
Acid-base deficit: 1 mmol/L (ref 0.0–2.0)
Bicarbonate: 24 mmol/L (ref 20.0–28.0)
Calcium, Ion: 0.88 mmol/L — CL (ref 1.15–1.40)
HCT: 26 % — ABNORMAL LOW (ref 39.0–52.0)
Hemoglobin: 8.8 g/dL — ABNORMAL LOW (ref 13.0–17.0)
O2 Saturation: 99 %
Patient temperature: 99.1
Potassium: 5 mmol/L (ref 3.5–5.1)
Sodium: 138 mmol/L (ref 135–145)
TCO2: 25 mmol/L (ref 22–32)
pCO2 arterial: 42.8 mmHg (ref 32.0–48.0)
pH, Arterial: 7.357 (ref 7.350–7.450)
pO2, Arterial: 129 mmHg — ABNORMAL HIGH (ref 83.0–108.0)

## 2020-05-07 LAB — CBC WITH DIFFERENTIAL/PLATELET
Abs Immature Granulocytes: 0.85 10*3/uL — ABNORMAL HIGH (ref 0.00–0.07)
Basophils Absolute: 0.1 10*3/uL (ref 0.0–0.1)
Basophils Relative: 0 %
Eosinophils Absolute: 0 10*3/uL (ref 0.0–0.5)
Eosinophils Relative: 0 %
HCT: 26.1 % — ABNORMAL LOW (ref 39.0–52.0)
Hemoglobin: 8.3 g/dL — ABNORMAL LOW (ref 13.0–17.0)
Immature Granulocytes: 2 %
Lymphocytes Relative: 4 %
Lymphs Abs: 1.6 10*3/uL (ref 0.7–4.0)
MCH: 29.9 pg (ref 26.0–34.0)
MCHC: 31.8 g/dL (ref 30.0–36.0)
MCV: 93.9 fL (ref 80.0–100.0)
Monocytes Absolute: 0.4 10*3/uL (ref 0.1–1.0)
Monocytes Relative: 1 %
Neutro Abs: 34.3 10*3/uL — ABNORMAL HIGH (ref 1.7–7.7)
Neutrophils Relative %: 93 %
Platelets: 292 10*3/uL (ref 150–400)
RBC: 2.78 MIL/uL — ABNORMAL LOW (ref 4.22–5.81)
RDW: 18.6 % — ABNORMAL HIGH (ref 11.5–15.5)
WBC Morphology: INCREASED
WBC: 37.2 10*3/uL — ABNORMAL HIGH (ref 4.0–10.5)
nRBC: 0.1 % (ref 0.0–0.2)

## 2020-05-07 LAB — RENAL FUNCTION PANEL
Albumin: 2.3 g/dL — ABNORMAL LOW (ref 3.5–5.0)
Albumin: 2.2 g/dL — ABNORMAL LOW (ref 3.5–5.0)
Anion gap: 20 — ABNORMAL HIGH (ref 5–15)
Anion gap: 17 — ABNORMAL HIGH (ref 5–15)
BUN: 39 mg/dL — ABNORMAL HIGH (ref 6–20)
BUN: 46 mg/dL — ABNORMAL HIGH (ref 6–20)
CO2: 20 mmol/L — ABNORMAL LOW (ref 22–32)
CO2: 21 mmol/L — ABNORMAL LOW (ref 22–32)
Calcium: 7 mg/dL — ABNORMAL LOW (ref 8.9–10.3)
Calcium: 6.6 mg/dL — ABNORMAL LOW (ref 8.9–10.3)
Chloride: 98 mmol/L (ref 98–111)
Chloride: 101 mmol/L (ref 98–111)
Creatinine, Ser: 4.27 mg/dL — ABNORMAL HIGH (ref 0.61–1.24)
Creatinine, Ser: 4.8 mg/dL — ABNORMAL HIGH (ref 0.61–1.24)
GFR, Estimated: 16 mL/min — ABNORMAL LOW (ref >60)
GFR, Estimated: 14 mL/min — ABNORMAL LOW (ref >60)
Glucose, Bld: 85 mg/dL (ref 70–99)
Glucose, Bld: 99 mg/dL (ref 70–99)
Phosphorus: 10.4 mg/dL — ABNORMAL HIGH (ref 2.5–4.6)
Phosphorus: 11.9 mg/dL — ABNORMAL HIGH (ref 2.5–4.6)
Potassium: 4.7 mmol/L (ref 3.5–5.1)
Potassium: 5.5 mmol/L — ABNORMAL HIGH (ref 3.5–5.1)
Sodium: 138 mmol/L (ref 135–145)
Sodium: 139 mmol/L (ref 135–145)

## 2020-05-07 LAB — HEPATIC FUNCTION PANEL
ALT: 140 U/L — ABNORMAL HIGH (ref 0–44)
AST: 111 U/L — ABNORMAL HIGH (ref 15–41)
Albumin: 2.3 g/dL — ABNORMAL LOW (ref 3.5–5.0)
Alkaline Phosphatase: 199 U/L — ABNORMAL HIGH (ref 38–126)
Bilirubin, Direct: 2 mg/dL — ABNORMAL HIGH (ref 0.0–0.2)
Indirect Bilirubin: 3.5 mg/dL — ABNORMAL HIGH (ref 0.3–0.9)
Total Bilirubin: 5.5 mg/dL — ABNORMAL HIGH (ref 0.3–1.2)
Total Protein: 6.2 g/dL — ABNORMAL LOW (ref 6.5–8.1)

## 2020-05-07 LAB — GLUCOSE, CAPILLARY
Glucose-Capillary: 73 mg/dL (ref 70–99)
Glucose-Capillary: 85 mg/dL (ref 70–99)
Glucose-Capillary: 67 mg/dL — ABNORMAL LOW (ref 70–99)
Glucose-Capillary: 110 mg/dL — ABNORMAL HIGH (ref 70–99)
Glucose-Capillary: 100 mg/dL — ABNORMAL HIGH (ref 70–99)
Glucose-Capillary: 66 mg/dL — ABNORMAL LOW (ref 70–99)
Glucose-Capillary: 91 mg/dL (ref 70–99)
Glucose-Capillary: 67 mg/dL — ABNORMAL LOW (ref 70–99)

## 2020-05-07 LAB — PROTIME-INR
INR: 2.4 — ABNORMAL HIGH (ref 0.8–1.2)
Prothrombin Time: 25.6 seconds — ABNORMAL HIGH (ref 11.4–15.2)

## 2020-05-07 IMAGING — DX DG CHEST 1V PORT
1 series · 1 of 1 positions shown · non-contrast
Comparison: Film from earlier in the same day.

CLINICAL DATA: Check central line placement

EXAM:
PORTABLE CHEST 1 VIEW

[chest]
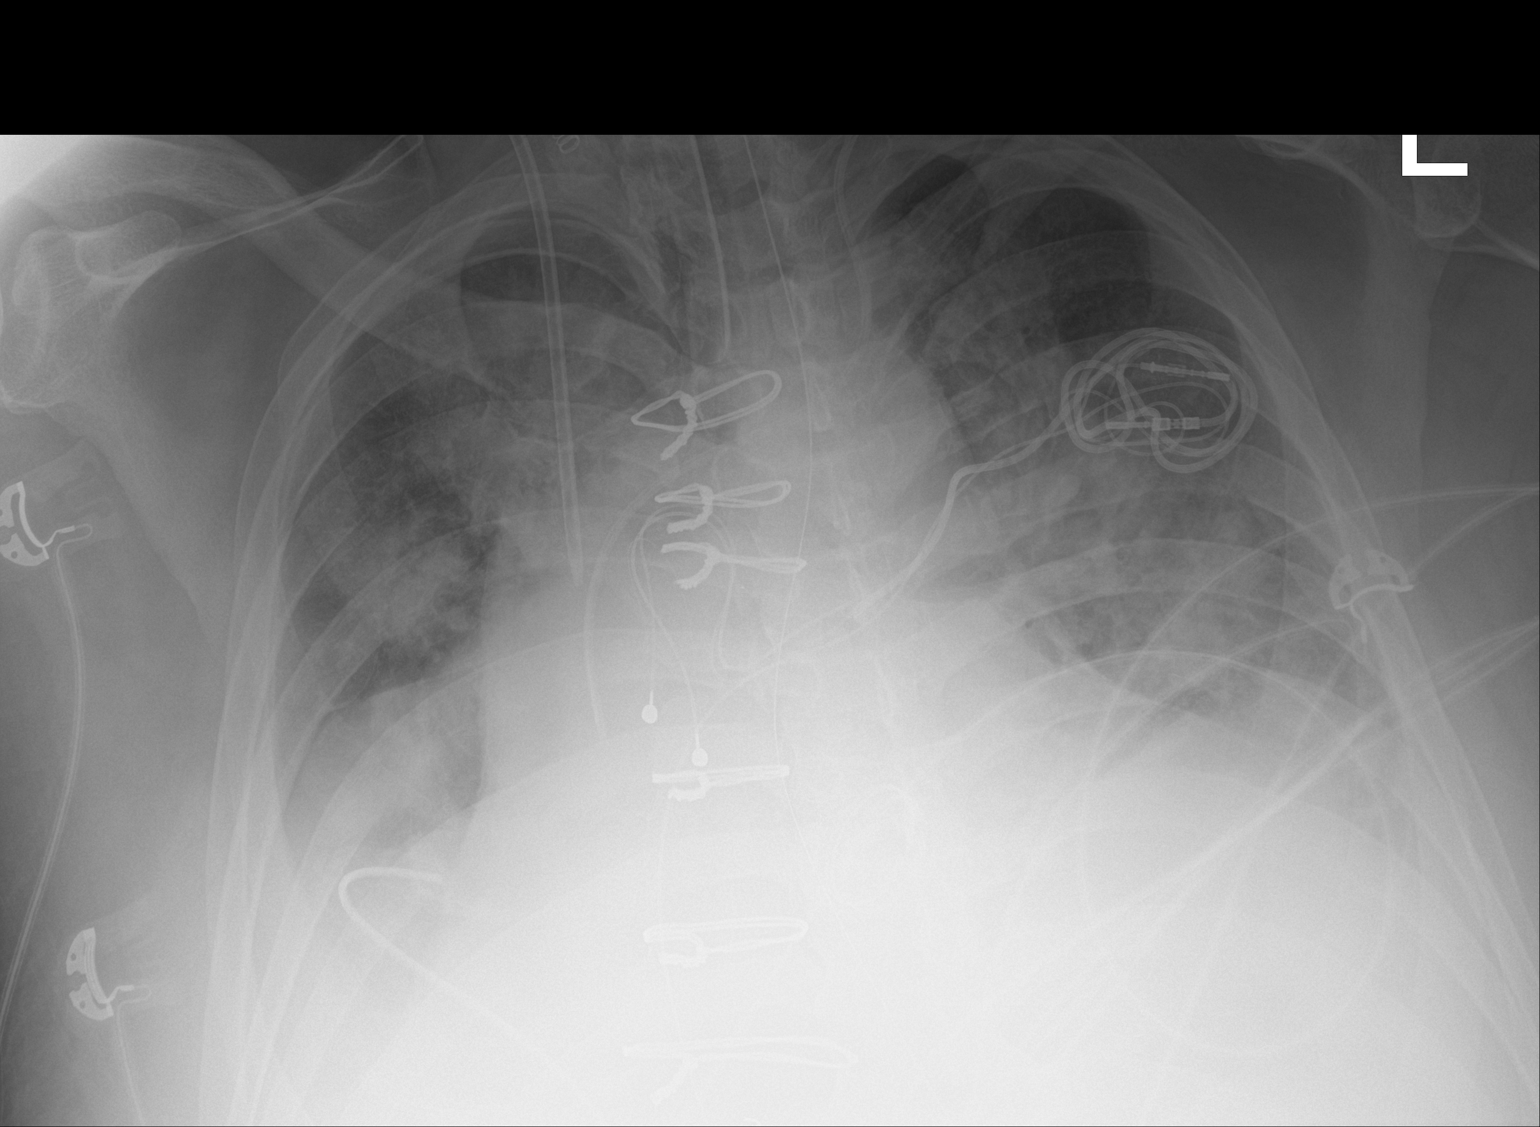

[1 of 1 positions shown; findings below may reference images not displayed]

FINDINGS: New right jugular temporary dialysis catheter is noted in the mid
superior vena cava. No pneumothorax is noted. Endotracheal tube,
nasogastric catheter and left jugular central line are again seen
and stable. Cardiac shadow is stable. Patchy airspace opacities are
noted with right-sided pleural effusion.
IMPRESSION: No pneumothorax following right jugular central line placement.

## 2020-05-07 IMAGING — DX DG CHEST 1V PORT
1 series · 1 of 1 positions shown · non-contrast
Comparison: [DATE]

CLINICAL DATA: ARDS

EXAM:
PORTABLE CHEST 1 VIEW

[chest ap]
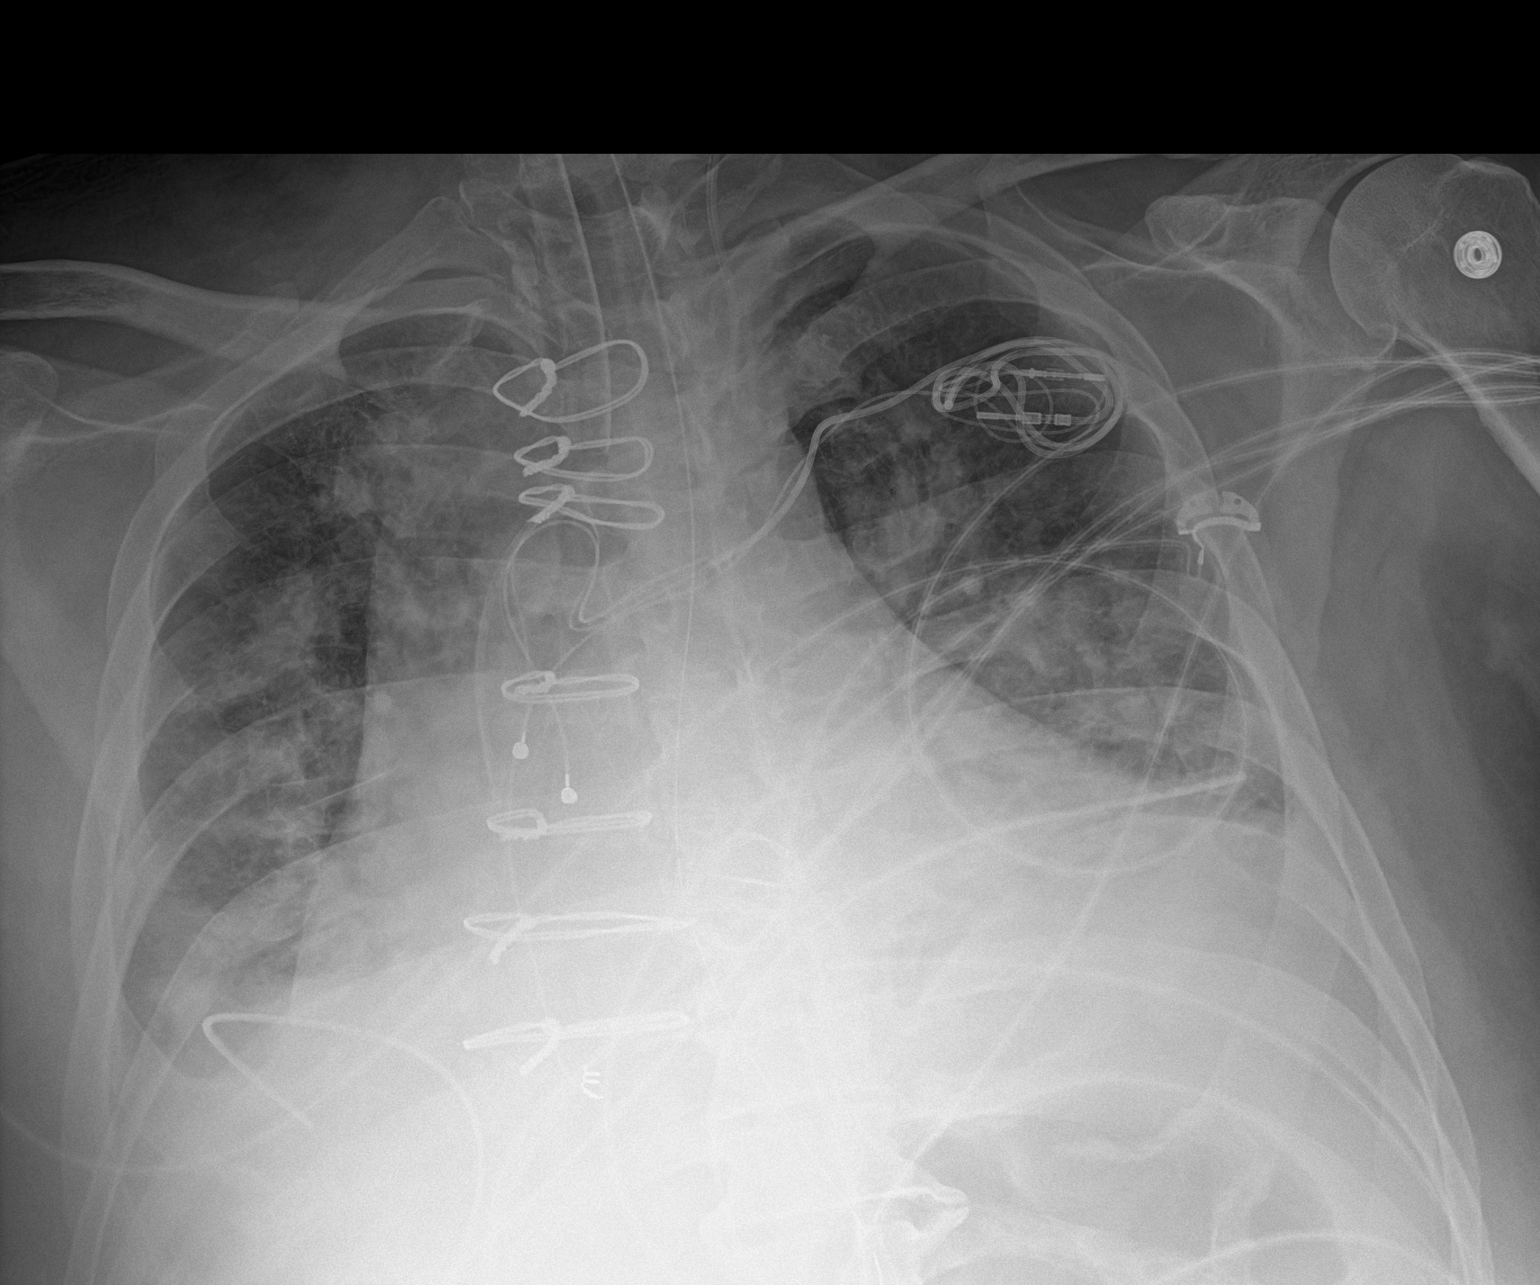

[1 of 1 positions shown; findings below may reference images not displayed]

FINDINGS: Endotracheal tube, gastric catheter and left jugular central line
are again seen and stable. Old pacer wires are again identified.
Cardiomegaly is stable. Bilateral airspace opacity is again
identified similar to that seen on the prior exam. Bilateral
thoracostomy catheters are noted without evidence of pneumothorax.
No bony abnormality is seen.
IMPRESSION: Tubes and lines as described above.

Persistent bilateral airspace opacities stable from the prior study.
No pneumothorax is noted.

## 2020-05-07 IMAGING — CT CT CHEST W/O CM
3 of 4 series · 15 of 46 positions shown, 16 images · non-contrast
Comparison: Abdomen/pelvis CT [DATE]

CLINICAL DATA: Cardiac arrest/asystole. Status post CPR. Bowel
obstruction suspected.

EXAM:
CT CHEST, ABDOMEN AND PELVIS WITHOUT CONTRAST
TECHNIQUE: Multidetector CT imaging of the chest, abdomen and pelvis was
performed following the standard protocol without IV contrast.

[Series 3: cap without · axial · non-contrast · 0.98mm/px · z∈[-599,-89]mm · 7 of 138 slices shown, 8 images]
[im 18/138  soft-tissue]
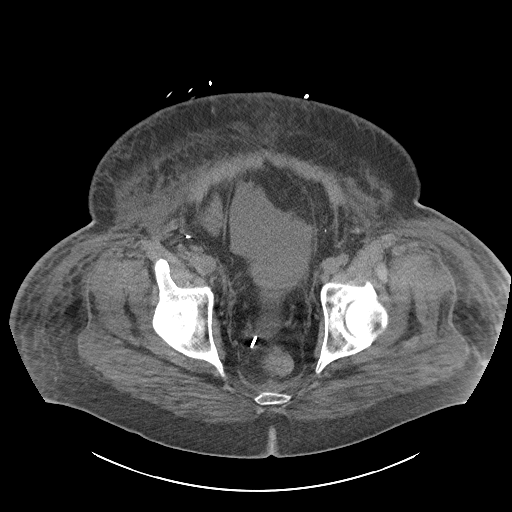
[im 18/138  bone]
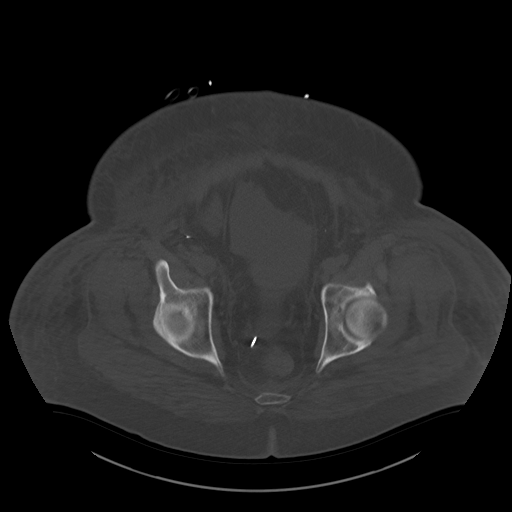
[im 35/138  soft-tissue]
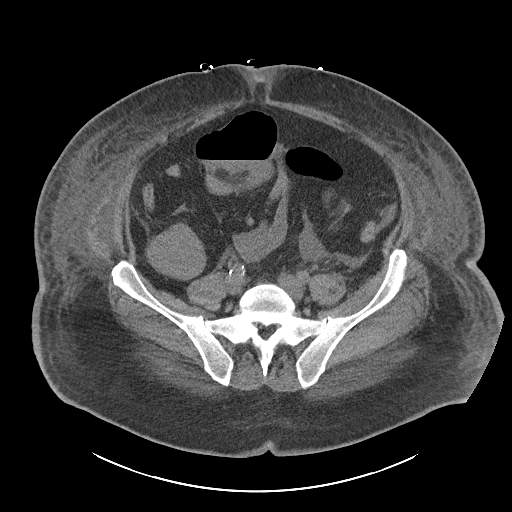
[im 52/138  soft-tissue]
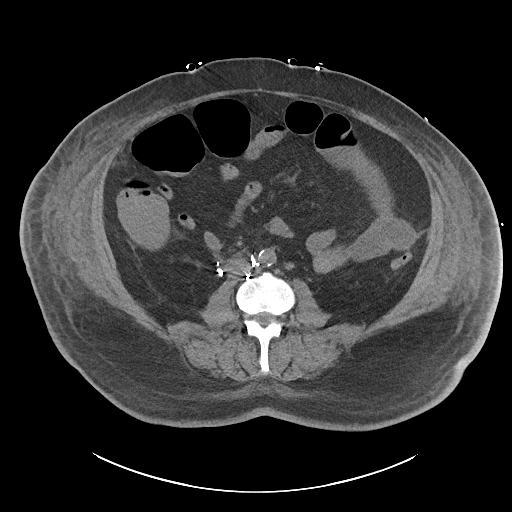
[im 69/138  soft-tissue]
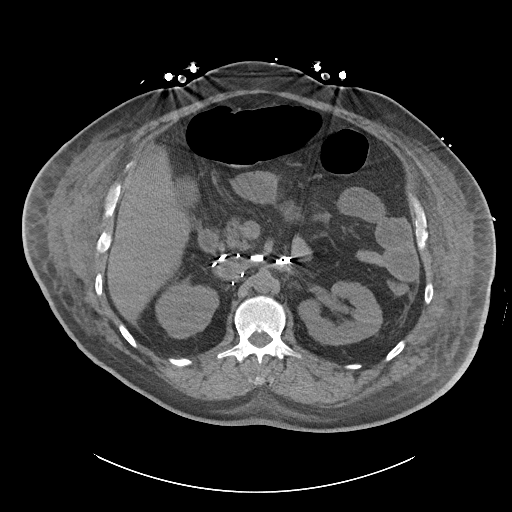
[im 86/138  soft-tissue]
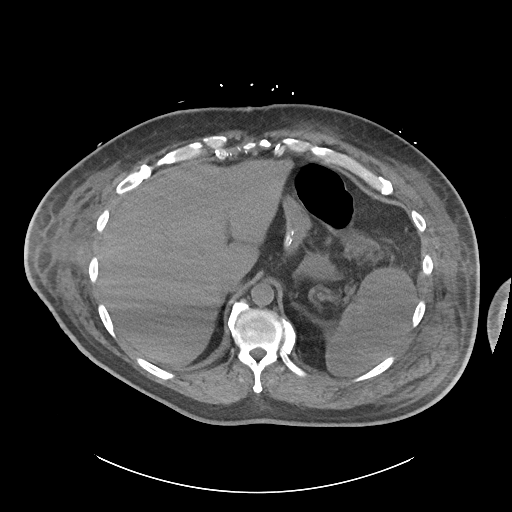
[im 103/138  soft-tissue]
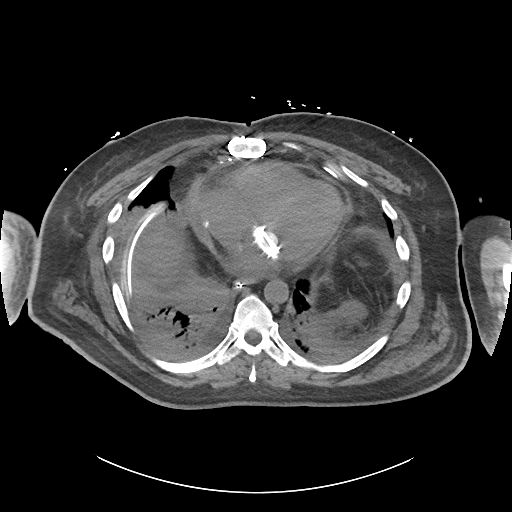
[im 120/138  soft-tissue]
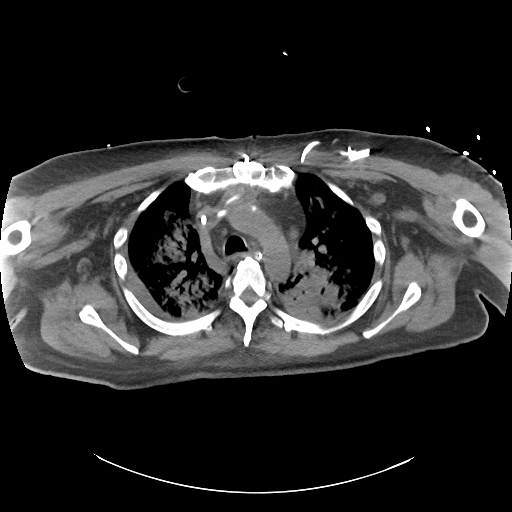

[Series 5: lungs · axial · 0.98mm/px · z∈[-625,-313]mm · 5 of 345 slices shown]
[im 32/345  soft-tissue]
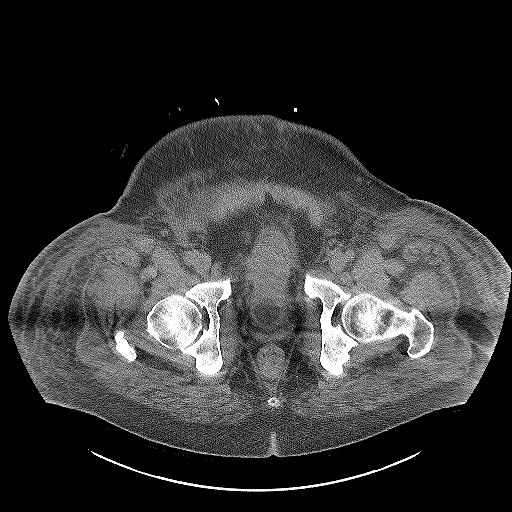
[im 63/345  soft-tissue]
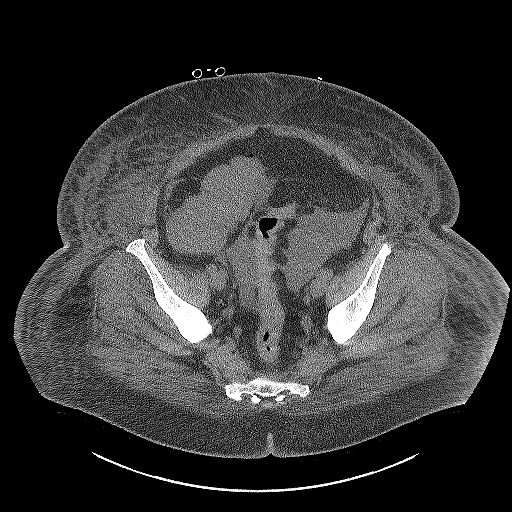
[im 110/345  soft-tissue]
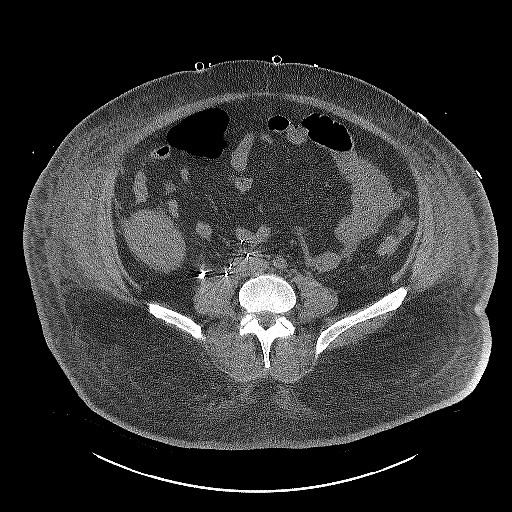
[im 157/345  soft-tissue]
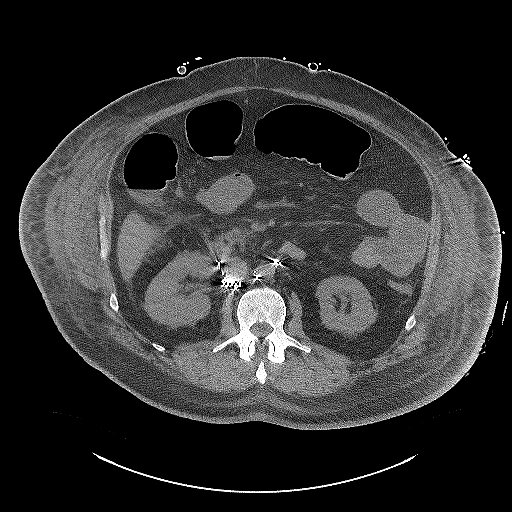
[im 188/345  soft-tissue]
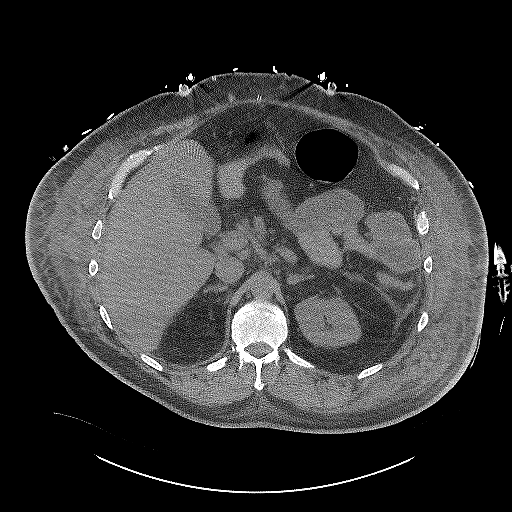

[Series 6: cor · coronal · 1.01mm/px · 3 of 132 slices shown]
[im 44/132  soft-tissue]
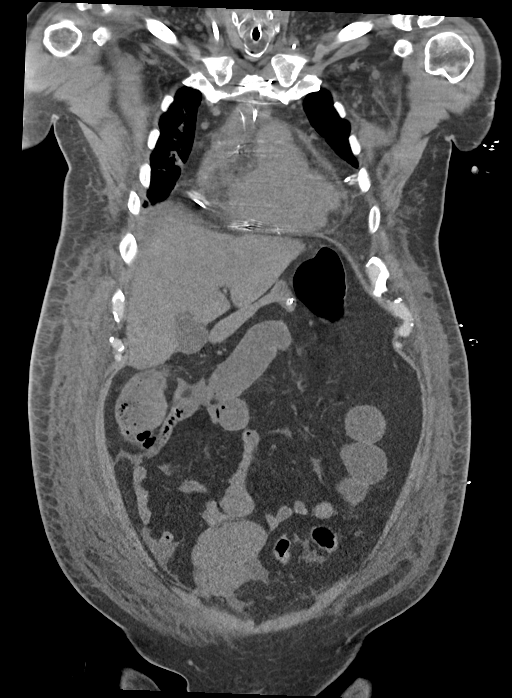
[im 59/132  soft-tissue]
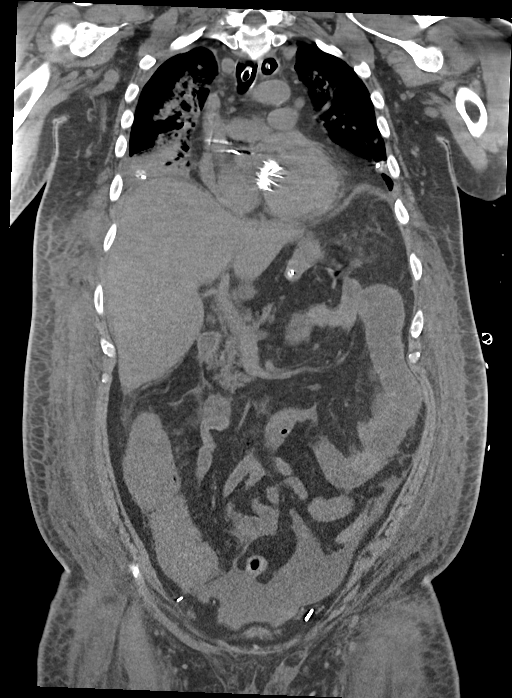
[im 73/132  soft-tissue]
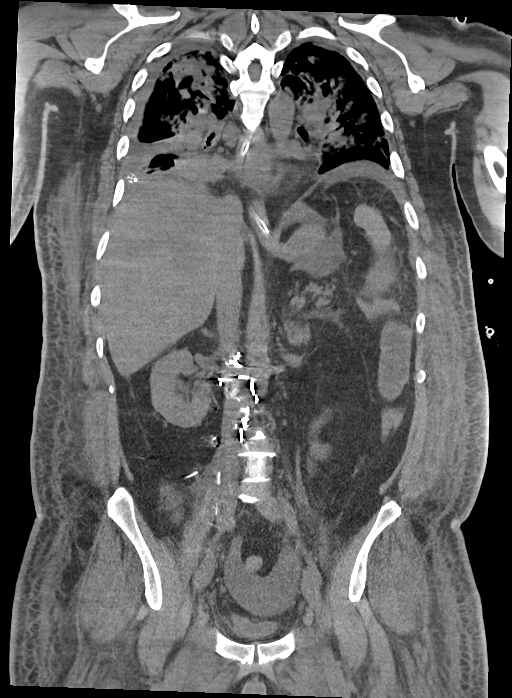

[15 of 46 positions shown; findings below may reference images not displayed]

FINDINGS: CT CHEST FINDINGS

Cardiovascular: Heart size upper normal. Trace pericardial effusion.
Status post mitral valve replacement. Right central line tip is
positioned in the mid SVC. Left IJ central line tip is in the mid to
distal SVC. There is abdominal aortic atherosclerosis without
aneurysm. Endotracheal tube tip is positioned in the distal trachea.
NG tube tip is in the mid stomach.

Mediastinum/Nodes: Scattered small mediastinal lymph nodes evident
without lymphadenopathy. Hilar regions obscured by parahilar
collapse/consolidative opacity. The esophagus has normal imaging
features. Scattered small axillary nodes are evident bilaterally.

Lungs/Pleura: Patchy ground-glass and consolidative opacity is
identified bilaterally, centrally predominant in the upper lobes
with more confluent collapse/consolidative disease in the lower
lungs bilaterally. Small bilateral pleural effusions noted. Pleural
drains are evident bilaterally.

Musculoskeletal: No worrisome lytic or sclerotic osseous
abnormality. Posterior right first rib fracture noted.

CT ABDOMEN PELVIS FINDINGS

Hepatobiliary: No focal abnormality in the liver on this study
without intravenous contrast. There is no evidence for gallstones,
gallbladder wall thickening, or pericholecystic fluid. No
intrahepatic or extrahepatic biliary dilation.

Pancreas: No focal mass lesion. No dilatation of the main duct. No
intraparenchymal cyst. No peripancreatic edema.

Spleen: Spleen is markedly heterogeneous. Although assessment is
limited by lack of intravenous contrast material, imaging features
could be compatible with multiple splenic infarcts. Inferior
subcapsular hematoma cannot be excluded.

Adrenals/Urinary Tract: No adrenal nodule or mass. Unremarkable
noncontrast appearance of the kidneys. No evidence for hydroureter.
Bladder decompressed by Foley catheter.

Stomach/Bowel: Stomach is decompressed. Duodenum is normally
positioned as is the ligament of Treitz. Fluid-filled small bowel
loops measure up to 3.6 cm diameter. Small bowel wall cannot be
thoroughly assessed given lack of intravenous contrast. Right colon
is moderately distended and fluid-filled transverse colon is non
dependent and diffusely air-filled with mild distention. No wall
thickening in the transverse colon. Left colon is decompressed as is
the sigmoid colon.

Vascular/Lymphatic: There is abdominal aortic atherosclerosis
without aneurysm. Numerous vascular clips are seen in the
retroperitoneal space of the abdomen No pelvic sidewall
lymphadenopathy.

Reproductive: The prostate gland and seminal vesicles are
unremarkable.

Other: Small to moderate volume pelvic ascites.

Musculoskeletal: Diffuse body wall edema noted. No worrisome lytic
or sclerotic osseous abnormality.
IMPRESSION: 1. Patchy ground-glass and consolidative opacity bilaterally,
centrally predominant in the upper lobes with more confluent
collapse/consolidative disease in the lower lungs bilaterally.
Imaging features could be related to pulmonary edema or diffuse
infection. Small bilateral pleural effusions with bilateral pleural
drains in place.
2. Markedly heterogeneous spleen. Although assessment is limited by
lack of intravenous contrast material, imaging features are
compatible with multiple splenic infarcts. Inferior subcapsular
hematoma cannot be excluded.
3. Fluid-filled minimally distended small bowel loops with mild
distention of the right and transverse colon. Imaging features
likely related to ileus.
4. Small to moderate volume pelvic ascites.
5. Diffuse body wall edema.
6. Posterior right first rib fracture.
7. Aortic Atherosclerosis ([9W]-[9W]).

## 2020-05-07 MED ORDER — HEPARIN (PORCINE) 2000 UNITS/L FOR CRRT
INTRAVENOUS_CENTRAL | Status: DC | PRN
  Filled 2020-05-07: qty 1000

## 2020-05-07 MED ORDER — BISACODYL 10 MG RE SUPP
10.0000 mg | Freq: Two times a day (BID) | RECTAL | Status: DC
  Administered 2020-05-07 – 2020-05-08 (×3): 10 mg via RECTAL
  Filled 2020-05-07 (×3): qty 1

## 2020-05-07 MED ORDER — METHYLNALTREXONE BROMIDE 12 MG/0.6ML ~~LOC~~ SOLN
12.0000 mg | Freq: Once | SUBCUTANEOUS | Status: AC
  Administered 2020-05-07: 12 mg via SUBCUTANEOUS
  Filled 2020-05-07: qty 0.6

## 2020-05-07 MED ORDER — PANTOPRAZOLE SODIUM 40 MG IV SOLR
40.0000 mg | INTRAVENOUS | Status: DC
  Administered 2020-05-07 – 2020-05-08 (×2): 40 mg via INTRAVENOUS
  Filled 2020-05-07 (×2): qty 40

## 2020-05-07 MED ORDER — AMIODARONE HCL IN DEXTROSE 360-4.14 MG/200ML-% IV SOLN: 30.0000 mg/h | INTRAVENOUS | Status: DC

## 2020-05-07 MED ORDER — INSULIN ASPART 100 UNIT/ML ~~LOC~~ SOLN: 0.0000 [IU] | SUBCUTANEOUS | Status: DC

## 2020-05-07 MED ORDER — PRISMASOL BGK 4/2.5 32-4-2.5 MEQ/L REPLACEMENT SOLN: Status: DC

## 2020-05-07 MED ORDER — NEOSTIGMINE METHYLSULFATE 10 MG/10ML IV SOLN
0.2500 mg | Freq: Four times a day (QID) | INTRAVENOUS | Status: AC
  Administered 2020-05-07 – 2020-05-08 (×6): 0.25 mg via INTRAMUSCULAR
  Filled 2020-05-07 (×11): qty 0.25

## 2020-05-07 MED ORDER — AMIODARONE LOAD VIA INFUSION
150.0000 mg | Freq: Once | INTRAVENOUS | Status: AC
  Administered 2020-05-07: 150 mg via INTRAVENOUS
  Filled 2020-05-07: qty 83.34

## 2020-05-07 MED ORDER — PRISMASOL BGK 4/2.5 32-4-2.5 MEQ/L EC SOLN: Status: DC

## 2020-05-07 MED ORDER — ASPIRIN 300 MG RE SUPP
150.0000 mg | Freq: Every day | RECTAL | Status: DC
  Administered 2020-05-07 – 2020-05-08 (×2): 150 mg via RECTAL
  Filled 2020-05-07 (×2): qty 1

## 2020-05-07 MED ORDER — AMIODARONE HCL IN DEXTROSE 360-4.14 MG/200ML-% IV SOLN
60.0000 mg/h | INTRAVENOUS | Status: AC
  Filled 2020-05-07 (×2): qty 200

## 2020-05-07 MED ORDER — CALCIUM GLUCONATE-NACL 1-0.675 GM/50ML-% IV SOLN
1.0000 g | Freq: Once | INTRAVENOUS | Status: AC
  Administered 2020-05-07: 1000 mg via INTRAVENOUS
  Filled 2020-05-07: qty 50

## 2020-05-07 MED ORDER — HEPARIN SODIUM (PORCINE) 1000 UNIT/ML DIALYSIS
1000.0000 [IU] | INTRAMUSCULAR | Status: DC | PRN
  Filled 2020-05-07: qty 6

## 2020-05-07 NOTE — Procedures (Signed)
Central Venous Catheter Insertion Procedure Note  Jonathan Shaffer  [id number]  25-Jun-1965  Date:05/07/20  Time:10:48 AM   Provider Performing:Keela Rubert E Nili Honda   Procedure: Insertion of Non-tunneled Central Venous Catheter(36556)with US guidance (87867)    Indication(s) Hemodialysis  Consent Risks of the procedure as well as the alternatives and risks of each were explained to the patient and/or caregiver.  Consent for the procedure was obtained and is signed in the bedside chart  Anesthesia Topical only with 1% lidocaine , propofol and fentanyl infusions as per PAD while on mechanical ventilation.   Timeout Verified patient identification, verified procedure, site/side was marked, verified correct patient position, special equipment/implants available, medications/allergies/relevant history reviewed, required imaging and test results available.  Sterile Technique Maximal sterile technique including full sterile barrier drape, hand hygiene, sterile gown, sterile gloves, mask, hair covering, sterile ultrasound probe cover (if used).  Procedure Description Area of catheter insertion was cleaned with chlorhexidine and draped in sterile fashion.   With real-time ultrasound guidance a HD catheter was placed into the right internal jugular vein.  Nonpulsatile blood flow and easy flushing noted in all ports.  The catheter was sutured in place and sterile dressing applied.  Complications/Tolerance None; patient tolerated the procedure well. Chest X-ray is ordered to verify placement for internal jugular cannulation, and is pending.   EBL Minimal  Specimen(s) None   Eliseo Gum MSN, AGACNP-BC [place] [phone] If no answer, [phone] 05/07/2020, 10:50 AM

## 2020-05-07 NOTE — Progress Notes (Signed)
[facility] BRIEF SICU PROGRESS NOTE  7 Days Post-Op  S/P Procedure(s) (LRB): MITRAL VALVE (MV) REPLACEMENT USING ON-X MITRAL VALVE SIZE 31/33 MM,  PERMANENT EPICARDIAL PACEMAKING SYSTEM IMPLANT USING MEDTRONIC LEADS (N/A) TRANSESOPHAGEAL ECHOCARDIOGRAM (TEE) (N/A)   No change in neuro status Brief episode Afib earlier, otherwise NSR BP stable on levophed and vasopressin Attempts to wean FiO2 and PEEP unsuccessful CT w/out any clear signs of mesenteric ischemia, bowel gas pattern c/w ileus Possible splenic infarcts  Plan: Continue current plan  Rexene Alberts, MD 05/07/2020 5:57 PM

## 2020-05-07 NOTE — Progress Notes (Signed)
Patient taken to CT as scheduled at 1300. During CT Patient had difficulty with lying flat RN and RT had to reposition patient several times prior to obtaining CT due to inability to maintain O2 sats. Patient was able to maintain sats with 30 degree elevation during CT. During transport from CT back to patient room, Patient began to have arrhythmias which looked to be PVCs and NSVT on the monitor. Upon further examination, The patient had converted to Afib with rate up to 170s non-sustained. This RN notified Ina Homes MD. Amiodorone was ordered by MD. Upon getting the amio gtt ready to administer, the patient had converted back to sinus rhythm. Stawicki MD notified and Amio held at this time per MD.   About 1700 the patient converted back into Afib HR up to 130s-170s.  Tamala Julian MD notified and Amiodorone protocol started. Amio bolus 150mg  was administered to the patient. Patient then brady down with an irregular bradicardic rhythm. Ouk MD notified and came to bedside. Amiodorone stopped at this time d/t irregular bradycardia per Tamala Julian MD.    Luberta Mutter RN

## 2020-05-07 NOTE — Progress Notes (Signed)
RT note- attempted to decrease fio2 and peep, unable, dialysis catheter being placed, desat 86%, fio2 now back to 70% and peep +10. Will check ABG. Nitric being weaned off at this time.

## 2020-05-07 NOTE — Progress Notes (Signed)
RT note-Patient taken to CT, after moving patient to scanner, sp02 dropped to 78%. Patient was then sat up with wedge behind back, sp02 improved, scan done and placed back to bed and HOB up to 30. Sp02 now 94%, returned to room and suctioned for copious amount pink tan secretions. Remains on 100% and PEEP +12. Continue to monitor.

## 2020-05-07 NOTE — Progress Notes (Signed)
Rt note- several attempts to wean fio2 and peep, unable, Nitric is off.

## 2020-05-07 NOTE — Progress Notes (Signed)
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)   AKI (acute kidney injury) ([place])   Endocarditis of mitral valve   Bacteremia   S/P MVR (mitral valve repair)   Embolic disease of toe (HCC)   Venous insufficiency (chronic) (peripheral)   History of open heart surgery   Foot lesion   Antibiotics:  Total days of antibiotics 16             Nafcillin 11/20 >> 12/4, cefazolin 12/4-             Piptazo 12/3  Assessment 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 He has completed almost 2 weeks of therapy by today  -Continue Metronidazole 500mg  PO TID  -Repeat 2 sets of blood cultures today. He is having some low grade fevers. Last Blood cx was done on 11/23. No diarrhea per RN. Has left IJ CVC in place -monitor CBC and CMP on IV abx -Following   Rest of the management as per the primary team. Thank you for the  consult. Please page with pertinent questions or concerns.  Rosiland Oz, MD Infectious [place] [facility]   To contact the attending provider between 8A-5P or the covering provider during after hours 5P-8A, please log into the web site [email or web address] and access using universal [place] password for that web site. If you do not have the password, please call the hospital operator. ______________________________________________________________________ Subjective patient seen and examined at the bedside. Wife at bedside. Discussed with ICU attending at bedside regarding changing Nafcillin to cefazolin.   Down from Fio2 of 100 to 70%, PEEP12. On vasopression, Levopehed from 40 to 11.   Vitals BP (!) 93/57   Pulse 81   Temp 99.1 F (37.3 C) (Axillary)   Resp (!) 22   Ht 6\' 3"  (1.905 m)   Wt 134.1 kg   SpO2 97%   BMI 36.95 kg/m   Physical Exam Constitutional:      Comments: Oedematous body   Cardiovascular:     Rate and Rhythm: Normal rate and regular rhythm.     Heart sounds: No murmur heard.   Pulmonary:     Effort: On vent     Comments:   Abdominal:     Palpations: Abdomen is distended and tense but soft     Tenderness:   Musculoskeletal:        General: diffuse swelling  Skin:    Comments: Did not see rashes in the extremities,  abdomen, chest, face  Neurological:     General: Unable to assess    LINES/TUBES:left IJ CVC  METAL IMPLANT/HARDWARE:  Pertinent Microbiology Results for orders placed or performed during the hospital encounter of 04/04/2020  Blood Culture (routine x 2)     Status: Abnormal   Collection Time: 04/11/2020  2:00 AM   Specimen: BLOOD RIGHT HAND  Result Value Ref Range Status   Specimen Description   Final    BLOOD RIGHT HAND Performed at [facility], [address] [place], [place] [address]    Special Requests   Final    BOTTLES DRAWN AEROBIC AND ANAEROBIC Blood Culture adequate  volume Performed at [facility], [address] [place], [place] [address]    Culture  Setup Time   Final    GRAM POSITIVE COCCI IN BOTH AEROBIC AND ANAEROBIC BOTTLES CRITICAL RESULT CALLED TO, READ BACK BY AND VERIFIED WITHSeward Meth [facility] 2109 04/30/2020 A BROWNING Performed at [facility], [place] [address] [place], [place] [address]    Culture STAPHYLOCOCCUS AUREUS (A)  Final   Report Status 04/24/2020 FINAL  Final   Organism ID, Bacteria STAPHYLOCOCCUS AUREUS  Final      Susceptibility   Staphylococcus aureus - MIC*    CIPROFLOXACIN <=0.5 SENSITIVE Sensitive     ERYTHROMYCIN <=0.25 SENSITIVE Sensitive     GENTAMICIN <=0.5 SENSITIVE Sensitive     OXACILLIN 0.5 SENSITIVE Sensitive     TETRACYCLINE <=1 SENSITIVE Sensitive     VANCOMYCIN 1 SENSITIVE Sensitive     TRIMETH/SULFA <=10 SENSITIVE Sensitive     CLINDAMYCIN <=0.25 SENSITIVE Sensitive     RIFAMPIN <=0.5 SENSITIVE Sensitive     Inducible Clindamycin NEGATIVE Sensitive     * STAPHYLOCOCCUS AUREUS  Blood Culture ID Panel (Reflexed)     Status: Abnormal   Collection Time: 04/06/2020  2:00 AM  Result Value Ref Range Status   Enterococcus faecalis NOT DETECTED NOT DETECTED Final   Enterococcus Faecium NOT DETECTED NOT DETECTED Final   Listeria monocytogenes NOT DETECTED NOT DETECTED Final   Staphylococcus species DETECTED (A) NOT DETECTED Final    Comment: CRITICAL RESULT CALLED TO, READ BACK BY AND VERIFIED WITH: N GOLGOVAC PHARMD 2109 04/24/2020 A BROWNING    Staphylococcus aureus (BCID) DETECTED (A) NOT DETECTED Final    Comment: CRITICAL RESULT CALLED TO, READ BACK BY AND VERIFIED WITH: N GOLGOVAC PHARMD 2109 04/29/2020 A BROWNING    Staphylococcus epidermidis NOT DETECTED NOT DETECTED Final   Staphylococcus lugdunensis NOT DETECTED NOT DETECTED Final   Streptococcus species NOT DETECTED NOT DETECTED Final   Streptococcus agalactiae NOT DETECTED NOT DETECTED Final   Streptococcus pneumoniae NOT DETECTED  NOT DETECTED Final   Streptococcus pyogenes NOT DETECTED NOT DETECTED Final   A.calcoaceticus-baumannii NOT DETECTED NOT DETECTED Final   Bacteroides fragilis NOT DETECTED NOT DETECTED Final   Enterobacterales NOT DETECTED NOT DETECTED Final   Enterobacter cloacae complex NOT DETECTED NOT DETECTED Final   Escherichia coli NOT DETECTED NOT DETECTED Final   Klebsiella aerogenes NOT DETECTED NOT DETECTED Final   Klebsiella oxytoca NOT DETECTED NOT DETECTED Final   Klebsiella pneumoniae NOT DETECTED NOT DETECTED Final   Proteus species NOT DETECTED NOT DETECTED Final   Salmonella species NOT DETECTED NOT DETECTED Final   Serratia marcescens NOT DETECTED NOT DETECTED Final   Haemophilus influenzae NOT DETECTED NOT DETECTED Final   Neisseria meningitidis NOT DETECTED NOT DETECTED Final   Pseudomonas aeruginosa NOT DETECTED  NOT DETECTED Final   Stenotrophomonas maltophilia NOT DETECTED NOT DETECTED Final   Candida albicans NOT DETECTED NOT DETECTED Final   Candida auris NOT DETECTED NOT DETECTED Final   Candida glabrata NOT DETECTED NOT DETECTED Final   Candida krusei NOT DETECTED NOT DETECTED Final   Candida parapsilosis NOT DETECTED NOT DETECTED Final   Candida tropicalis NOT DETECTED NOT DETECTED Final   Cryptococcus neoformans/gattii NOT DETECTED NOT DETECTED Final   Meth resistant mecA/C and MREJ NOT DETECTED NOT DETECTED Final    Comment: Performed at [facility], [place] [address], [place], [place] [address]  Urine culture     Status: Abnormal   Collection Time: 04/06/2020  2:02 AM   Specimen: In/Out Cath Urine  Result Value Ref Range Status   Specimen Description IN/OUT CATH URINE  Final   Special Requests NONE  Final   Culture 40,000 COLONIES/mL STAPHYLOCOCCUS AUREUS (A)  Final   Report Status 04/24/2020 FINAL  Final   Organism ID, Bacteria STAPHYLOCOCCUS AUREUS (A)  Final      Susceptibility   Staphylococcus aureus - MIC*    CIPROFLOXACIN <=0.5 SENSITIVE Sensitive      GENTAMICIN <=0.5 SENSITIVE Sensitive     NITROFURANTOIN 32 SENSITIVE Sensitive     OXACILLIN <=0.25 SENSITIVE Sensitive     TETRACYCLINE <=1 SENSITIVE Sensitive     VANCOMYCIN 1 SENSITIVE Sensitive     TRIMETH/SULFA <=10 SENSITIVE Sensitive     CLINDAMYCIN <=0.25 SENSITIVE Sensitive     RIFAMPIN <=0.5 SENSITIVE Sensitive     Inducible Clindamycin NEGATIVE Sensitive     * 40,000 COLONIES/mL STAPHYLOCOCCUS AUREUS  Resp Panel by RT-PCR (Flu A&B, Covid) Nasopharyngeal Swab     Status: None   Collection Time: 04/24/2020  2:05 AM   Specimen: Nasopharyngeal Swab; Nasopharyngeal(NP) swabs in vial transport medium  Result Value Ref Range Status   SARS Coronavirus 2 by RT PCR NEGATIVE NEGATIVE Final    Comment: (NOTE) SARS-CoV-2 target nucleic acids are NOT DETECTED.  The SARS-CoV-2 RNA is generally detectable in upper respiratory specimens during the acute phase of infection. The lowest concentration of SARS-CoV-2 viral copies this assay can detect is 138 copies/mL. A negative result does not preclude SARS-Cov-2 infection and should not be used as the sole basis for treatment or other patient management decisions. A negative result may occur with  improper specimen collection/handling, submission of specimen other than nasopharyngeal swab, presence of viral mutation(s) within the areas targeted by this assay, and inadequate number of viral copies(<138 copies/mL). A negative result must be combined with clinical observations, patient history, and epidemiological information. The expected result is Negative.  Fact Sheet for Patients:  EntrepreneurPulse.com.au  Fact Sheet for Healthcare Providers:  IncredibleEmployment.be  This test is no t yet approved or cleared by the [place] FDA and  has been authorized for detection and/or diagnosis of SARS-CoV-2 by FDA under an Emergency Use Authorization (EUA). This EUA will remain  in effect (meaning this  test can be used) for the duration of the COVID-19 declaration under Section 564(b)(1) of the Act, 21 U.S.C.section 360bbb-3(b)(1), unless the authorization is terminated  or revoked sooner.       Influenza A by PCR NEGATIVE NEGATIVE Final   Influenza B by PCR NEGATIVE NEGATIVE Final    Comment: (NOTE) The Xpert Xpress SARS-CoV-2/FLU/RSV plus assay is intended as an aid in the diagnosis of influenza from Nasopharyngeal swab specimens and should not be used as a sole basis for treatment. Nasal washings and  aspirates are unacceptable for Xpert Xpress SARS-CoV-2/FLU/RSV testing.  Fact Sheet for Patients: EntrepreneurPulse.com.au  Fact Sheet for Healthcare Providers: IncredibleEmployment.be  This test is not yet approved or cleared by the [place] FDA and has been authorized for detection and/or diagnosis of SARS-CoV-2 by FDA under an Emergency Use Authorization (EUA). This EUA will remain in effect (meaning this test can be used) for the duration of the COVID-19 declaration under Section 564(b)(1) of the Act, 21 U.S.C. section 360bbb-3(b)(1), unless the authorization is terminated or revoked.  Performed at [facility], [address] [place], [place] [address]   Blood Culture (routine x 2)     Status: Abnormal   Collection Time: 04/12/2020  2:06 AM   Specimen: BLOOD LEFT FOREARM  Result Value Ref Range Status   Specimen Description   Final    BLOOD LEFT FOREARM Performed at [facility], [address] [place], [place] [address]    Special Requests   Final    BOTTLES DRAWN AEROBIC AND ANAEROBIC Blood Culture adequate volume Performed at [facility], [address] [place], [place] [address]    Culture  Setup Time   Final    GRAM POSITIVE COCCI IN BOTH AEROBIC AND ANAEROBIC BOTTLES CRITICAL VALUE NOTED.  VALUE IS CONSISTENT WITH PREVIOUSLY REPORTED AND CALLED VALUE.    Culture (A)  Final     STAPHYLOCOCCUS AUREUS SUSCEPTIBILITIES PERFORMED ON PREVIOUS CULTURE WITHIN THE LAST 5 DAYS. Performed at [facility], [place] [address] [place], [place] [address]    Report Status 04/24/2020 FINAL  Final  MRSA PCR Screening     Status: None   Collection Time: 04/13/2020  6:24 AM   Specimen: Nasal Mucosa; Nasopharyngeal  Result Value Ref Range Status   MRSA by PCR NEGATIVE NEGATIVE Final    Comment:        The GeneXpert MRSA Assay (FDA approved for NASAL specimens only), is one component of a comprehensive MRSA colonization surveillance program. It is not intended to diagnose MRSA infection nor to guide or monitor treatment for MRSA infections. Performed at [facility], [place] [address], [place], [place] [address]   Culture, blood (single)     Status: Abnormal   Collection Time: 04/05/2020  1:53 PM   Specimen: BLOOD RIGHT HAND  Result Value Ref Range Status   Specimen Description   Final    BLOOD RIGHT HAND Performed at [place] [address] [place], [place] [address]    Special Requests   Final    BOTTLES DRAWN AEROBIC AND ANAEROBIC Blood Culture results may not be optimal due to an inadequate volume of blood received in culture bottles Performed at [place] [address] [place], [place] [address]    Culture  Setup Time   Final    ANAEROBIC BOTTLE ONLY GRAM POSITIVE COCCI CRITICAL RESULT CALLED TO, READ BACK BY AND VERIFIED WITH: Concepcion Elk [phone] MLM Performed at [facility], [address] [place], [place] [address]    Culture STAPHYLOCOCCUS AUREUS (A)  Final   Report Status 04/28/2020 FINAL  Final   Organism ID, Bacteria STAPHYLOCOCCUS AUREUS  Final      Susceptibility   Staphylococcus aureus - MIC*    CIPROFLOXACIN <=0.5 SENSITIVE Sensitive     ERYTHROMYCIN <=0.25 SENSITIVE Sensitive     GENTAMICIN <=0.5 SENSITIVE Sensitive     OXACILLIN 0.5 SENSITIVE Sensitive  TETRACYCLINE <=1 SENSITIVE Sensitive     VANCOMYCIN 1 SENSITIVE Sensitive     TRIMETH/SULFA <=10 SENSITIVE Sensitive     CLINDAMYCIN <=0.25 SENSITIVE Sensitive     RIFAMPIN <=0.5 SENSITIVE Sensitive     Inducible Clindamycin NEGATIVE Sensitive     * STAPHYLOCOCCUS AUREUS  Blood Culture ID Panel (Reflexed)     Status: Abnormal   Collection Time: 04/13/2020  1:53 PM  Result Value Ref Range Status   Enterococcus faecalis NOT DETECTED NOT DETECTED Final   Enterococcus Faecium NOT DETECTED NOT DETECTED Final   Listeria monocytogenes NOT DETECTED NOT DETECTED Final   Staphylococcus species DETECTED (A) NOT DETECTED Final    Comment: CRITICAL RESULT CALLED TO, READ BACK BY AND VERIFIED WITH: PHARMD M HICKS [phone] MLM    Staphylococcus aureus (BCID) DETECTED (A) NOT DETECTED Final    Comment: CRITICAL RESULT CALLED TO, READ BACK BY AND VERIFIED WITH: PHARMD M HICKS [id number] [id number] MLM    Staphylococcus epidermidis NOT DETECTED NOT DETECTED Final   Staphylococcus lugdunensis NOT DETECTED NOT DETECTED Final   Streptococcus species NOT DETECTED NOT DETECTED Final   Streptococcus agalactiae NOT DETECTED NOT DETECTED Final   Streptococcus pneumoniae NOT DETECTED NOT DETECTED Final   Streptococcus pyogenes NOT DETECTED NOT DETECTED Final   A.calcoaceticus-baumannii NOT DETECTED NOT DETECTED Final   Bacteroides fragilis NOT DETECTED NOT DETECTED Final   Enterobacterales NOT DETECTED NOT DETECTED Final   Enterobacter cloacae complex NOT DETECTED NOT DETECTED Final   Escherichia coli NOT DETECTED NOT DETECTED Final   Klebsiella aerogenes NOT DETECTED NOT DETECTED Final   Klebsiella oxytoca NOT DETECTED NOT DETECTED Final   Klebsiella pneumoniae NOT DETECTED NOT DETECTED Final   Proteus species NOT DETECTED NOT DETECTED Final   Salmonella species NOT DETECTED NOT DETECTED Final   Serratia marcescens NOT DETECTED NOT DETECTED Final   Haemophilus influenzae NOT DETECTED NOT DETECTED Final    Neisseria meningitidis NOT DETECTED NOT DETECTED Final   Pseudomonas aeruginosa NOT DETECTED NOT DETECTED Final   Stenotrophomonas maltophilia NOT DETECTED NOT DETECTED Final   Candida albicans NOT DETECTED NOT DETECTED Final   Candida auris NOT DETECTED NOT DETECTED Final   Candida glabrata NOT DETECTED NOT DETECTED Final   Candida krusei NOT DETECTED NOT DETECTED Final   Candida parapsilosis NOT DETECTED NOT DETECTED Final   Candida tropicalis NOT DETECTED NOT DETECTED Final   Cryptococcus neoformans/gattii NOT DETECTED NOT DETECTED Final   Meth resistant mecA/C and MREJ NOT DETECTED NOT DETECTED Final    Comment: Performed at [facility], [address], [place], [place] [address]  Culture, blood (Routine X 2) w Reflex to ID Panel     Status: None   Collection Time: 04/24/20  7:52 AM   Specimen: BLOOD LEFT HAND  Result Value Ref Range Status   Specimen Description   Final    BLOOD LEFT HAND Performed at [facility], [place] [address], [place], [place] [address]    Special Requests   Final    BOTTLES DRAWN AEROBIC AND ANAEROBIC BLOOD LEFT HAND Performed at [place] [address] [place], [place] [address]    Culture   Final    NO GROWTH 5 DAYS Performed at [facility], [place] [address] [place], [place] [address]    Report Status 04/22/2020 FINAL  Final  Culture, blood (Routine X 2) w Reflex to ID Panel     Status: None   Collection Time: 04/24/20  7:52 AM   Specimen: BLOOD LEFT HAND  Result Value Ref Range Status   Specimen Description   Final    BLOOD LEFT HAND Performed at [facility], [address], [place], [place] [address]    Special Requests   Final    BOTTLES DRAWN AEROBIC AND ANAEROBIC BLOOD LEFT HAND Performed at [place] [address], [place], [place] [address]    Culture   Final    NO GROWTH 5 DAYS Performed at [facility], [place] [address] [place], [place] [address]    Report  Status 04/06/2020 FINAL  Final  Aerobic/Anaerobic Culture (surgical/deep wound)     Status: None   Collection Time: 04/07/2020  2:48 PM   Specimen: PATH Other; Tissue  Result Value Ref Range Status   Specimen Description   Final    ABSCESS SHOULDER RIGHT Performed at [place] [address], [place], [place] [address]    Special Requests   Final    NONE Performed at [facility], [place] [address] [place], [place] [address]    Gram Stain   Final    FEW WBC PRESENT, PREDOMINANTLY PMN ABUNDANT GRAM POSITIVE COCCI    Culture   Final    MODERATE STAPHYLOCOCCUS AUREUS NO ANAEROBES ISOLATED Performed at [facility], [place] [address], [place], [place] [address]    Report Status 05/01/2020 FINAL  Final   Organism ID, Bacteria STAPHYLOCOCCUS AUREUS  Final      Susceptibility   Staphylococcus aureus - MIC*    CIPROFLOXACIN <=0.5 SENSITIVE Sensitive     ERYTHROMYCIN <=0.25 SENSITIVE Sensitive     GENTAMICIN <=0.5 SENSITIVE Sensitive     OXACILLIN 0.5 SENSITIVE Sensitive     TETRACYCLINE <=1 SENSITIVE Sensitive     VANCOMYCIN 1 SENSITIVE Sensitive     TRIMETH/SULFA <=10 SENSITIVE Sensitive     CLINDAMYCIN <=0.25 SENSITIVE Sensitive     RIFAMPIN <=0.5 SENSITIVE Sensitive     Inducible Clindamycin NEGATIVE Sensitive     * MODERATE STAPHYLOCOCCUS AUREUS  Aerobic/Anaerobic Culture (surgical/deep wound)     Status: None   Collection Time: 05/03/2020  2:58 PM   Specimen: PATH Other; Tissue  Result Value Ref Range Status   Specimen Description   Final    TISSUE SHOULDER RIGHT Performed at [place] [address], [place], [place] [address]    Special Requests   Final    NONE Performed at [facility], [place] [address] [place], [place] [address]    Gram Stain   Final    ABUNDANT WBC PRESENT, PREDOMINANTLY PMN ABUNDANT GRAM POSITIVE COCCI    Culture   Final    ABUNDANT STAPHYLOCOCCUS AUREUS NO  ANAEROBES ISOLATED Performed at [facility], [place] [address] [place], [place] [address]    Report Status 05/01/2020 FINAL  Final   Organism ID, Bacteria STAPHYLOCOCCUS AUREUS  Final      Susceptibility   Staphylococcus aureus - MIC*    CIPROFLOXACIN <=0.5 SENSITIVE Sensitive     ERYTHROMYCIN <=0.25 SENSITIVE Sensitive     GENTAMICIN <=0.5 SENSITIVE Sensitive     OXACILLIN 0.5 SENSITIVE Sensitive     TETRACYCLINE <=1 SENSITIVE Sensitive     VANCOMYCIN <=0.5 SENSITIVE Sensitive     TRIMETH/SULFA <=10 SENSITIVE Sensitive     CLINDAMYCIN <=0.25 SENSITIVE Sensitive     RIFAMPIN <=0.5 SENSITIVE Sensitive     Inducible Clindamycin NEGATIVE Sensitive     *  ABUNDANT STAPHYLOCOCCUS AUREUS  Culture, blood (Routine X 2) w Reflex to ID Panel     Status: None   Collection Time: 04/26/20  9:58 AM   Specimen: BLOOD RIGHT HAND  Result Value Ref Range Status   Specimen Description   Final    BLOOD RIGHT HAND Performed at [place] [address], [place], [place] [address]    Special Requests   Final    BOTTLES DRAWN AEROBIC ONLY Blood Culture results may not be optimal due to an inadequate volume of blood received in culture bottles Performed at [place] [address] [place], [place] [address]    Culture   Final    NO GROWTH 5 DAYS Performed at [facility], [place] [address], [place], [place] [address]    Report Status 05/01/2020 FINAL  Final  Culture, blood (Routine X 2) w Reflex to ID Panel     Status: None   Collection Time: 04/26/20  9:58 AM   Specimen: BLOOD RIGHT HAND  Result Value Ref Range Status   Specimen Description   Final    BLOOD RIGHT HAND Performed at [place] [address] [place], [place] [address]    Special Requests   Final    BOTTLES DRAWN AEROBIC ONLY Blood Culture results may not be optimal due to an inadequate volume of blood received in culture bottles Performed at [place] [address], [place], [place] [address]    Culture   Final    NO GROWTH 5 DAYS Performed at [facility], [place] [address] [place], [place] [address]    Report Status 05/01/2020 FINAL  Final  Surgical pcr screen     Status: Abnormal   Collection Time: 04/15/2020  2:39 AM   Specimen: Nasal Mucosa; Nasal Swab  Result Value Ref Range Status   MRSA, PCR NEGATIVE NEGATIVE Final   Staphylococcus aureus POSITIVE (A) NEGATIVE Final    Comment: (NOTE) The Xpert SA Assay (FDA approved for NASAL specimens in patients 53 years of age and older), is one component of a comprehensive surveillance program. It is not intended to diagnose infection nor to guide or monitor treatment. Performed at [facility], [place] [address], [place], [place] [address]   Aerobic/Anaerobic Culture (surgical/deep wound)     Status: None   Collection Time: 04/22/2020 11:01 AM   Specimen: Other Source  Result Value Ref Range Status   Specimen Description TISSUE  Final   Special Requests   Final    MITRAL VALVE VEGETATION SPEC A ANTIBIOTICS ZINACEF VANCOMYCIN   Gram Stain   Final    ABUNDANT WBC PRESENT, PREDOMINANTLY PMN NO ORGANISMS SEEN    Culture   Final    RARE STAPHYLOCOCCUS AUREUS CRITICAL RESULT CALLED TO, READ BACK BY AND VERIFIED WITH: RN Mellissa Kohut [id number] AT 1212 BY CM NO ANAEROBES ISOLATED Performed at [facility], [place] [address] [place], [place] [address]    Report Status 05/05/2020 FINAL  Final   Organism ID, Bacteria STAPHYLOCOCCUS AUREUS  Final      Susceptibility   Staphylococcus aureus - MIC*    CIPROFLOXACIN <=0.5 SENSITIVE Sensitive     ERYTHROMYCIN <=0.25 SENSITIVE Sensitive     GENTAMICIN <=0.5 SENSITIVE Sensitive     OXACILLIN 0.5 SENSITIVE Sensitive     TETRACYCLINE <=1 SENSITIVE Sensitive     VANCOMYCIN 1 SENSITIVE Sensitive     TRIMETH/SULFA <=10 SENSITIVE  Sensitive     CLINDAMYCIN <=0.25 SENSITIVE Sensitive     RIFAMPIN <=0.5 SENSITIVE  Sensitive     Inducible Clindamycin NEGATIVE Sensitive     * RARE STAPHYLOCOCCUS AUREUS     Pertinent Lab. CBC Latest Ref Rng & Units 05/07/2020 05/07/2020 05/06/2020  WBC 4.0 - 10.5 K/uL - 37.2(H) 14.5(H)  Hemoglobin 13.0 - 17.0 g/dL 8.8(L) 8.3(L) 8.9(L)  Hematocrit 39 - 52 % 26.0(L) 26.1(L) 26.8(L)  Platelets 150 - 400 K/uL - 292 322   CMP Latest Ref Rng & Units 05/07/2020 05/07/2020 05/06/2020  Glucose 70 - 99 mg/dL - 85 108(H)  BUN 6 - 20 mg/dL - 39(H) 34(H)  Creatinine 0.61 - 1.24 mg/dL - 4.27(H) 3.60(H)  Sodium 135 - 145 mmol/L 138 138 137  Potassium 3.5 - 5.1 mmol/L 5.0 4.7 3.5  Chloride 98 - 111 mmol/L - 98 98  CO2 22 - 32 mmol/L - 20(L) 23  Calcium 8.9 - 10.3 mg/dL - 7.0(L) 7.0(L)  Total Protein 6.5 - 8.1 g/dL - 6.2(L) -  Total Bilirubin 0.3 - 1.2 mg/dL - 5.5(H) -  Alkaline Phos 38 - 126 U/L - 199(H) -  AST 15 - 41 U/L - 111(H) -  ALT 0 - 44 U/L - 140(H) -    Pertinent Imaging today Plain films and CT images have been personally visualized and interpreted; radiology reports have been reviewed. Decision making incorporated into the Impression / Recommendations.  Chest Xray 05/07/20 FINDINGS: New right jugular temporary dialysis catheter is noted in the mid superior vena cava. No pneumothorax is noted. Endotracheal tube, nasogastric catheter and left jugular central line are again seen and stable. Cardiac shadow is stable. Patchy airspace opacities are noted with right-sided pleural effusion.  IMPRESSION: No pneumothorax following right jugular central line placement.     I have spent approx 30 minutes for this patient encounter including review of prior medical records with greater than 50% of time being face to face and coordination of their care.

## 2020-05-07 NOTE — Progress Notes (Addendum)
NAME:  Jonathan Shaffer, MRN:  [id number], DOB:  1966/05/28, LOS: 61 ADMISSION DATE:  04/28/2020, CONSULTATION DATE:  04/23/20 REFERRING MD:  Jonathan Shaffer, CHIEF COMPLAINT:  Septic shock  Brief History   MSSA bacteremia +/- AC septic arthritis, septic emboli to spleen  History of present illness   Jonathan Shaffer is a 54 y/o gentleman admitted with sepsis, found to have MSSA bacteremia. He developed sudden onset nausea and vomiting and chills last week and later developed fevers and R shoulder pain. He was evaluated and felt to have a sprain. He was treated with an OP steroid injection and started oral steroids. He developed a painful rash on his hand. His progressive malaise, fevers, rash, and joint pain prompted return to the ED. At presentation he was febrile, tachycardic, tachypneic, with lactic acidosis. He was in DKA at presentation. He has a previous history of "high diabetes markers", which he reports he was not following up on. He was started on empiric vanc, zosyn & cefepime, and doxycycline. He has been evaluated by orthopaedics for possible septic arthritis of his R AC joint. ID has been consulted for MSSA+ blood cultures. No known history of IVDU or injuries prior to becoming ill. He has been encephalopathic this admission, prompting concern for septic emboli to the brain; MRI pending.  Past Medical History  Uncontrolled DM Testicular cancer; in remission [place] [facility] Events   Started vasopressors 11/20 MV replacement 11/27 12/2 aspiration event  Consults:  ID Ortho PCCM TCTS Nephrology  Procedures:  PICC  Mechanical MV replacement 11/27  Significant Diagnostic Tests:  MRI R shoulder> septic arthritis AC joint, possible intramuscular abscess, OM distal clavicle Echocardiogram 11/19> LVEF 55 to 60%, normal valves. LUE US> no DVT MRI left wrist> cellulitis, no septic arthritis  Micro Data:  Blood culture 11/19> MSSA  2/3 Urine culture 11/19> staph aureus Blood  culture 11/21> NG Shoulder culture 11/22> abundant MSSA Blood cultuers 11/23> NG Operative cultures 11/22> abundant WBC> rare MSSA  Antimicrobials:  Vanc 11/20 Zosyn 11/19 Cefepime 11/19- 11/20 Doxycycline 11/19 Nafcillin 11/20>  Interim history/subjective:  No events, remains sedated and intubated on vent. Pressor requirements and A-a gradient improved.  Objective   Blood pressure (!) 93/57, pulse 80, temperature 99.2 F (37.3 C), temperature source Oral, resp. rate (!) 26, height 6\' 3"  (1.905 m), weight 134.1 kg, SpO2 99 %.    Vent Mode: PCV FiO2 (%):  [70 %-100 %] 70 % Set Rate:  [16 bmp-26 bmp] 26 bmp Vt Set:  [670 mL] 670 mL PEEP:  [5 cmH20-12 cmH20] 12 cmH20 Plateau Pressure:  [32 cmH20-34 cmH20] 33 cmH20   Intake/Output Summary (Last 24 hours) at 05/07/2020 0700 Last data filed at 05/07/2020 0600 Gross per 24 hour  Intake 6833.42 ml  Output 1653 ml  Net 5180.42 ml   Filed Weights   05/06/20 0320 05/06/20 0600 05/07/20 0500  Weight: 135.1 kg 135.4 kg 134.1 kg    Examination: Constitutional: sedated ill appearing man on vent  Eyes: pupils pointpoint, reactive Ears, nose, mouth, and throat: ETT in place with thick secretions, trachea midline Cardiovascular: RRR, ext warm Respiratory: Scattered rhonci, not triggering vent Gastrointestinal: distended, tympanic to percussion Skin: R shoulder, dressing in place, leaking some purulent fluid around incision site Neurologic: heavily sedated, not withdrawing Psychiatric: RASS -5  WBC up question steroid effect CXR ARDS Renal function worse, 5L positive yesterday  Resolved Hospital Problem list   Lactic acidosis Septic shock Hyponatremia  Assessment & Plan:   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  - TCTS, ortho, ID following  - To change to cefazolin today with worsening renal function - DC warfarin,  resume heparin gtt once INR < 2  Acute respiratory failure post-operatively due to acute pulmonary edema, atelectasis, bilateral effusion- improving Aspiration event leading to profound hypoxemia 12/4 ARDS Improved today - Wean iNO - ABG PRN - Usual lung protective tidal volumes and VAP prevention bundle  In hospital PEA arrest, severe septic/distributive shock- suspicion for aspiration given enlarging abdomen, N/V preceding. Improved - Levo/vaso titrated to MAP 65 - Continue stress steroids today, consider wean in AM - Culture shoulder wound output  Encephalopathy- post arrest currently heavily sedated due to O2 needs - Will need re-eval once off sedation  AKI- worsening related to shock, need fluid off, essentially anuric, should start CRRT today and try to keep even, appreciate nephro help  DM- now borderline hypoglycemic, - DC levemir  Shock liver- improving, trend  Ileus- contributing to FRC loss and respiratory failure, remains an issue - NGT to LIS, continue reglan, add suppository, relistor, SQ neostigmine  Best practice:  Diet: NPO, need to consider TPN if no BM soon Pain/Anxiety/Delirium protocol (if indicated): prop/fent titrated to vent synchrony VAP protocol (if indicated): in place DVT prophylaxis: warfarin/heparin GI prophylaxis: PPI Glucose control: SSI Mobility: BR Code Status: full Family Communication: updated wife at bedside, consents to HD catheter placement Disposition: ICU   Patient critically ill due to severe shock, severe hypoxemic respiratory failure Interventions to address this today vent support, pressors, antibiotics, inhaled nitric oxide Risk of deterioration without these interventions is high  I personally spent 45 minutes providing critical care not including any separately billable procedures  Jonathan Emery MD [facility] 05/07/2020 7:00 AM Personal pager: [phone] If unanswered, please page CCM On-call:  [phone]

## 2020-05-07 NOTE — Progress Notes (Signed)
[place] for warfarin > heparin while intubated Indication: On-X mitral valve   No Known Allergies  Patient Measurements: Height: 6\' 3"  (190.5 cm) Weight: 134.1 kg (295 lb 10.2 oz) IBW/kg (Calculated) : 84.5  Vital Signs: Temp: 99.1 F (37.3 C) (12/04 1100) Temp Source: Axillary (12/04 1100) BP: 101/59 (12/04 1200) Pulse Rate: 77 (12/04 1200)  Labs: Recent Labs    05/06/20 0053 05/06/20 0053 05/06/20 0537 05/06/20 0545 05/06/20 1335 05/06/20 1335 05/07/20 0206 05/07/20 1111  HGB 10.5*   < > 9.3*   < > 8.9*   < > 8.3* 8.8*  HCT 32.1*   < > 28.1*   < > 26.8*  --  26.1* 26.0*  PLT 407*   < > 338  --  322  --  292  --   LABPROT 26.2*  --   --   --  27.3*  --  25.6*  --   INR 2.5*  --   --   --  2.6*  --  2.4*  --   CREATININE 3.06*   < > 3.49*  --  3.60*  --  4.27*  --    < > = values in this interval not displayed.    Estimated Creatinine Clearance: 29.2 mL/min (A) (by C-G formula based on SCr of 4.27 mg/dL (H)).   Medical History: Past Medical History:  Diagnosis Date  . Cancer ([place])   . Diabetes mellitus without complication ([facility])   . High cholesterol   . Testicle cancer ([place])   . Thyroid disease   . 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 Hgb stable post-op, currently at 9.4, plt wnl. No bleeding noted.   INR with significant increase yesterday from 2 to 4.6, now down to 2.4. LFTs improving, CBC stable.   Goal of Therapy:  INR goal 2.5-3.5 Monitor platelets by anticoagulation protocol: Yes   Plan:  Hold warfarin for now while intubated Per discussion with CCM/TCTS - will start IV heparin when INR < 2 Recheck INR this afternoon. Resume warfarin as able  [place] Crane, Vena Austria, BCPS, BCCP Clinical Pharmacist  05/07/2020 1:51 PM   [facility] pharmacy phone numbers are listed on [email or web address]

## 2020-05-07 NOTE — Progress Notes (Signed)
Patient ID: Jonathan Shaffer, male   DOB: 11/28/1965, 54 y.o.   MRN: [id number] S: no events overnight.  Remains oliguric. O:BP (!) 93/57   Pulse 81   Temp 99.2 F (37.3 C) (Axillary)   Resp (!) 22   Ht $R'6\' 3"'rd$  (1.905 m)   Wt 134.1 kg   SpO2 100%   BMI 36.95 kg/m   Intake/Output Summary (Last 24 hours) at 05/07/2020 0832 Last data filed at 05/07/2020 0700 Gross per 24 hour  Intake 6409.07 ml  Output 1639 ml  Net 4770.07 ml   Intake/Output: I/O last 3 completed shifts: In: 7938 [P.O.:120; I.V.:4725.1; NG/GT:60; IV Piggyback:3032.9] Out: 2878 [Urine:713; Emesis/NG output:1225; Chest Tube:940]  Intake/Output this shift:  No intake/output data recorded. Weight change: -1 kg Gen: intubated and sedated CVS: RRR Resp: occ rhonchi Abd: distended, tense, hypoactive BS Ext: 1-2+ pitting edema of BLE  Recent Labs  Lab 05/01/20 1741 05/02/20 0407 05/03/20 0424 05/03/20 0424 05/04/20 0331 05/04/20 0331 05/04/20 2000 05/04/20 2000 05/05/20 0313 05/06/20 0053 05/06/20 0537 05/06/20 0545 05/06/20 1251 05/06/20 1335 05/07/20 0206  NA 140   < > 135   < > 136   < > 135   < > 135 137 137 139 139 137 138  K 4.1   < > 4.4   < > 3.8   < > 3.3*   < > 3.2* 3.2* 3.5 3.5 3.6 3.5 4.7  CL 106   < > 98   < > 100  --  99  --  98 99 99  --   --  98 98  CO2 21*   < > 22   < > 23  --  24  --  24 24 21*  --   --  23 20*  GLUCOSE 111*   < > 144*   < > 143*  --  164*  --  111* 107* 123*  --   --  108* 85  BUN 19   < > 32*   < > 34*  --  33*  --  35* 33* 35*  --   --  34* 39*  CREATININE 2.02*   < > 2.92*   < > 3.07*  --  3.00*  --  3.13* 3.06* 3.49*  --   --  3.60* 4.27*  ALBUMIN 1.8*  --  1.4*  --  1.7*  --   --   --  1.5*  1.5* 1.6*  1.6*  --   --   --   --  2.3*  2.3*  CALCIUM 6.8*   < > 6.9*   < > 6.9*  --  7.0*  --  7.1* 7.6* 7.3*  --   --  7.0* 7.0*  PHOS  --   --   --   --   --   --   --   --  7.5* 6.7*  --   --   --   --  10.4*  AST 3,257*  --  1,021*  --  480*  --   --   --  198* 118*  --    --   --   --  111*  ALT 984*  --  686*  --  517*  --   --   --  343* 270*  --   --   --   --  140*   < > = values in this interval not displayed.   Liver Function Tests: Recent  Labs  Lab 05/05/20 0313 05/06/20 0053 05/07/20 0206  AST 198* 118* 111*  ALT 343* 270* 140*  ALKPHOS 327* 316* 199*  BILITOT 3.2* 2.7* 5.5*  PROT 6.3* 6.9 6.2*  ALBUMIN 1.5*  1.5* 1.6*  1.6* 2.3*  2.3*   No results for input(s): LIPASE, AMYLASE in the last 168 hours. Recent Labs  Lab 05/03/20 0824  AMMONIA 43*   CBC: Recent Labs  Lab 05/05/20 0313 05/05/20 0313 05/06/20 0053 05/06/20 0053 05/06/20 0537 05/06/20 0545 05/06/20 1251 05/06/20 1335 05/07/20 0206  WBC 23.5*   < > 22.5*   < > 15.5*  --   --  14.5* 37.2*  NEUTROABS 20.3*  --  19.8*  --   --   --   --   --  34.3*  HGB 9.6*   < > 10.5*   < > 9.3*   < > 10.2* 8.9* 8.3*  HCT 28.8*   < > 32.1*   < > 28.1*   < > 30.0* 26.8* 26.1*  MCV 90.9  --  90.4  --  93.4  --   --  92.7 93.9  PLT 334   < > 407*   < > 338  --   --  322 292   < > = values in this interval not displayed.   Cardiac Enzymes: No results for input(s): CKTOTAL, CKMB, CKMBINDEX, TROPONINI in the last 168 hours. CBG: Recent Labs  Lab 05/06/20 1516 05/06/20 1949 05/06/20 2354 05/07/20 0357 05/07/20 0748  GLUCAP 98 83 79 73 85    Iron Studies: No results for input(s): IRON, TIBC, TRANSFERRIN, FERRITIN in the last 72 hours. Studies/Results: DG Abd 1 View  Result Date: 05/06/2020 CLINICAL DATA:  Nasogastric intubation EXAM: ABDOMEN - 1 VIEW COMPARISON:  05/04/2020 FINDINGS: Nasogastric tube is seen with its tip overlying the expected proximal body of the stomach. Multiple gas-filled loops of large and small bowel are seen within the visualized abdomen suggestive of an adynamic ileus. Pelvis and bilateral flanks are excluded from view. IMPRESSION: Nasogastric tube within the proximal body of the stomach. Electronically Signed   By: Fidela Salisbury MD   On: 05/06/2020  05:43   DG CHEST PORT 1 VIEW  Result Date: 05/06/2020 CLINICAL DATA:  Central placement. EXAM: PORTABLE CHEST 1 VIEW COMPARISON:  One-view chest x-ray 05/06/2020 FINDINGS: Heart is enlarged. Endotracheal tube is stable, 4 cm above the carina. Pacing wires are stable. Enteric tube courses off the inferior border the film. A new left IJ line is in place. The tip is at the cavoatrial junction. Interstitial edema has increased slightly. Bilateral pleural effusions are noted. Bilateral chest tubes are in place. No pneumothorax is present. IMPRESSION: 1. Interval placement of left IJ line with the tip at the cavoatrial junction. 2. Slight increase in interstitial edema. 3. Stable bilateral pleural effusions. 4. Stable support apparatus otherwise. Electronically Signed   By: San Morelle M.D.   On: 05/06/2020 09:23   DG CHEST PORT 1 VIEW  Result Date: 05/06/2020 CLINICAL DATA:  Intubation EXAM: PORTABLE CHEST 1 VIEW COMPARISON:  Two days ago FINDINGS: Endotracheal tube with tip between the clavicular heads and carina. The left IJ sheath has been removed. There is an enteric tube which at least reaches the stomach. Direct pacer leads without battery pack. Chest tubes in place. Low volume chest with atelectasis/vascular congestion that is increased. Stable cardiopericardial enlargement. IMPRESSION: 1. Unremarkable hardware positioning. 2. Increased vascular congestion or atelectasis. No visible pneumothorax. Electronically Signed  By: Marnee Spring M.D.   On: 05/06/2020 05:36   ECHOCARDIOGRAM LIMITED  Result Date: 05/06/2020    ECHOCARDIOGRAM LIMITED REPORT   Patient Name:   Jonathan Shaffer Date of Exam: 05/06/2020 Medical Rec #:  [id number]      Height:       75.0 in Accession #:    [id number]     Weight:       298.5 lb Date of Birth:  [birth year]     BSA:          2.602 m Patient Age:    54 years       BP:           103/53 mmHg Patient Gender: M              HR:           97 bpm. Exam Location:   Inpatient Procedure: Limited Echo Indications:    Pericardial effusion  History:        Patient has prior history of Echocardiogram examinations, most                 recent 04/10/2020. Risk Factors:Diabetes. S/P mitral valve                 repair. H/O endiocarditis of mitral valve. Bacteremia.  Sonographer:    Ross Ludwig RDCS (AE) Referring Phys: [id number] Lorin Glass  Sonographer Comments: Technically difficult study due to poor echo windows, suboptimal parasternal window, suboptimal apical window, patient is morbidly obese and echo performed with patient supine and on artificial respirator. Image acquisition challenging due to patient body habitus. IMPRESSIONS  1. The echo is technically difficult with poor image quality. I was initially concerned with possible akinesis of the inferobasal segments but with Definity contrast, the LV is seen to contract normally . Marland Kitchen Left ventricular ejection fraction, by estimation, is 60 to 65%. The left ventricle has normal function. The left ventricle demonstrates regional wall motion abnormalities (see scoring diagram/findings for description). There is severe akinesis of the left ventricular, basal inferior wall.  2. The aortic valve is grossly normal. Aortic valve regurgitation is not visualized. No aortic stenosis is present. FINDINGS  Left Ventricle: The echo is technically difficult with poor image quality. I was initially concerned with possible akinesis of the inferobasal segments but with Definity contrast, the LV is seen to contract normally. Left ventricular ejection fraction, by estimation, is 60 to 65%. The left ventricle has normal function. The left ventricle demonstrates regional wall motion abnormalities. Severe akinesis of the left ventricular, basal inferior wall. Definity contrast agent was given IV to delineate the left ventricular endocardial borders. The left ventricular internal cavity size was normal in size. Aortic Valve: The aortic valve is grossly  normal. Aortic valve regurgitation is not visualized. No aortic stenosis is present. Kristeen Miss MD Electronically signed by Kristeen Miss MD Signature Date/Time: 05/06/2020/10:46:09 AM    Final    [place] EKG SITE RITE  Result Date: 05/06/2020 If Site Rite image not attached, placement could not be confirmed due to current cardiac rhythm.  Marland Kitchen acetaminophen (TYLENOL) oral liquid 160 mg/5 mL  500 mg Per Tube Q8H  . aspirin  162 mg Per Tube Daily  . bisacodyl  10 mg Oral Daily   Or  . bisacodyl  10 mg Rectal Daily  . bisacodyl  10 mg Rectal BID  . chlorhexidine gluconate (MEDLINE KIT)  15 mL Mouth Rinse BID  . Chlorhexidine Gluconate Cloth  6 each Topical Daily  . hydrocortisone sod succinate (SOLU-CORTEF) inj  50 mg Intravenous Q6H  . insulin aspart  0-24 Units Subcutaneous Q4H  . lidocaine  1 patch Transdermal Daily  . loratadine  10 mg Per Tube Daily  . mouth rinse  15 mL Mouth Rinse 10 times per day  . methylnaltrexone  12 mg Subcutaneous Once  . metoCLOPramide (REGLAN) injection  10 mg Intravenous Q6H  . neostigmine  0.25 mg Intramuscular Q6H  . pantoprazole sodium  40 mg Per Tube Daily  . potassium chloride  40 mEq Per Tube Daily  . Warfarin - Pharmacist Dosing Inpatient   Does not apply q1600    BMET    Component Value Date/Time   NA 138 05/07/2020 0206   K 4.7 05/07/2020 0206   CL 98 05/07/2020 0206   CO2 20 (L) 05/07/2020 0206   GLUCOSE 85 05/07/2020 0206   BUN 39 (H) 05/07/2020 0206   CREATININE 4.27 (H) 05/07/2020 0206   CALCIUM 7.0 (L) 05/07/2020 0206   GFRNONAA 16 (L) 05/07/2020 0206   CBC    Component Value Date/Time   WBC 37.2 (H) 05/07/2020 0206   RBC 2.78 (L) 05/07/2020 0206   HGB 8.3 (L) 05/07/2020 0206   HCT 26.1 (L) 05/07/2020 0206   PLT 292 05/07/2020 0206   MCV 93.9 05/07/2020 0206   MCH 29.9 05/07/2020 0206   MCHC 31.8 05/07/2020 0206   RDW 18.6 (H) 05/07/2020 0206   LYMPHSABS 1.6 05/07/2020 0206   MONOABS 0.4 05/07/2020 0206   EOSABS 0.0  05/07/2020 0206   BASOSABS 0.1 05/07/2020 0206    Assessment/Plan: 1. AKI, non-oliguric- Unclear etiology, however he has had multiple renal insults with hypotension following surgery requiring pressors, multiple antibiotics, IV contrast, and diuretics with marked diuresis since his surgery. UA with moderate blood, trace leukocytes, negative protein. DDx includes ischemic ATN from hypotension/pressors, AIN from Nafcillin, immune mediated GN due to endocarditis, septic emboli to kidneys, or pre-renal azotemia due to diuresis and 3rd spacing. Now with superimposed ischemic ATN in setting of respiratory arrest and shock from aspiration pna requiring pressors. 1. + urineeosinophils, however no eosinophilia on cbc with diff. Nonspecific and could be due to AIN or atheroemboli to kidneys. Discussed with ID and once he has been treated appropriately for CNS infection, will stop Nafcillin and switch to IV Ancef (after 14 days of Nafcillin)  2. Serologies negative dsDNA, ASO, GBM, ANA, normal CHF but + ANCA PR3 at 23. C3 low but normal C4 consistent with alternative pathway activation  3. Renal artery duplex without stenosis 4. Hold diuretics for now and follow UOP and daily Scr.  5. Renal dose meds for eGFR is <20 with rising BUN/Cr. Avoid nephrotoxic agents (NSAIDs/IV contrast, Fleets enema). 6. Remains oliguric/anuric.  Agree with initiating CRRT and will keep even for now. 2. VDRF following Acute respiratory arrest with aspiration - early this morning now with shock and on pressors per PCCM 3. 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 ID following and will require 6 weeks of IV abx. 4. S/p MVR 5. Sinus node dysfunction- has known atrial rhythm and may require PPM 6. DM type 2 7. Anemia of critical illness/ABLA following surgery- transfuse as needed 8. Acute CHF following sinus arrest 9. Rash- on  torso, possible drug rash from Nafcillin.ID following and will let us know if he has been treated long enough for CNS involvement and if so  will change to ancef  Donetta Potts, MD [facility] [phone]

## 2020-05-07 NOTE — Progress Notes (Signed)
Hypoglycemic Event  CBG: 67 @ 1112  Treatment: 12.5g Dextrose 50% Symptoms: N/A- intubated/sedated  Follow-up CBG: [id number] CBG Result: 110  Possible Reasons for Event: NPO/Intubated  Comments/MD notified: Erskine Emery MD    Jonathan Shaffer

## 2020-05-07 NOTE — Progress Notes (Signed)
[address]       [place] [address]             [phone]        CARDIOTHORACIC SURGERY PROGRESS NOTE   R7 Days Post-Op Procedure(s) (LRB): MITRAL VALVE (MV) REPLACEMENT USING ON-X MITRAL VALVE SIZE 31/33 MM,  PERMANENT EPICARDIAL PACEMAKING SYSTEM IMPLANT USING MEDTRONIC LEADS (N/A) TRANSESOPHAGEAL ECHOCARDIOGRAM (TEE) (N/A)  Subjective: Unresponsive on vent.  No events overnight.  Objective: Vital signs: BP Readings from Last 1 Encounters:  05/07/20 (!) 93/57   Pulse Readings from Last 1 Encounters:  05/07/20 81   Resp Readings from Last 1 Encounters:  05/07/20 (!) 22   Temp Readings from Last 1 Encounters:  05/07/20 99.2 F (37.3 C) (Axillary)    Hemodynamics:    Physical Exam:  Rhythm:   sinus  Breath sounds: Coarse but clear  Heart sounds:  RRR w/ mechanical sounds  Incisions:  Clean and dry  Abdomen:  Soft, non-distended, quiet  Extremities:  Warm, well-perfused, swollen   Intake/Output from previous day: 12/03 0701 - 12/04 0700 In: 6958.8 [I.V.:4723.5; NG/GT:30; IV Piggyback:2205.4] Out: 8144 [Urine:63; Emesis/NG output:750; Chest Tube:840] Intake/Output this shift: No intake/output data recorded.  Lab Results:  CBC: Recent Labs    05/06/20 1335 05/07/20 0206  WBC 14.5* 37.2*  HGB 8.9* 8.3*  HCT 26.8* 26.1*  PLT 322 292    BMET:  Recent Labs    05/06/20 1335 05/07/20 0206  NA 137 138  K 3.5 4.7  CL 98 98  CO2 23 20*  GLUCOSE 108* 85  BUN 34* 39*  CREATININE 3.60* 4.27*  CALCIUM 7.0* 7.0*     PT/INR:   Recent Labs    05/07/20 0206  LABPROT 25.6*  INR 2.4*    CBG (last 3)  Recent Labs    05/06/20 2354 05/07/20 0357 05/07/20 0748  GLUCAP 79 73 85    ABG    Component Value Date/Time   PHART 7.392 05/06/2020 1251   PCO2ART 43.8 05/06/2020 1251   PO2ART 64 (L) 05/06/2020 1251   HCO3 26.5 05/06/2020 1251   TCO2 28 05/06/2020 1251   ACIDBASEDEF 2.0 05/06/2020 0545   O2SAT 91.0 05/06/2020  1251    CXR: PORTABLE CHEST 1 VIEW  COMPARISON:  05/06/2020  FINDINGS: Endotracheal tube, gastric catheter and left jugular central line are again seen and stable. Old pacer wires are again identified. Cardiomegaly is stable. Bilateral airspace opacity is again identified similar to that seen on the prior exam. Bilateral thoracostomy catheters are noted without evidence of pneumothorax. No bony abnormality is seen.  IMPRESSION: Tubes and lines as described above.  Persistent bilateral airspace opacities stable from the prior study. No pneumothorax is noted.   Electronically Signed   By: Inez Catalina M.D.   On: 05/07/2020 08:51   Assessment/Plan: S/P Procedure(s) (LRB): MITRAL VALVE (MV) REPLACEMENT USING ON-X MITRAL VALVE SIZE 31/33 MM,  PERMANENT EPICARDIAL PACEMAKING SYSTEM IMPLANT USING MEDTRONIC LEADS (N/A) TRANSESOPHAGEAL ECHOCARDIOGRAM (TEE) (N/A)  S/P acute respiratory arrest early morning 12/3 w/ likely aspiration Maintaining NSR w/ stable BP on somewhat lower dose levophed but still on vasopressin Remains unresponsive on vent w/ O2 sats 99-100% on 60% PEEP 12, weaning nitric oxide, CXR w/ moderate diffuse opacity both lung fields c/w aspiration pneumonia +/- ARDS Acute oliguric renal failure w/ plans to initiate CRRT later today Leukocytosis w/ WBC up 37k likely related to sepsis +/- bolus steroids Abdominal distension w/ vomiting and aspiration  event, possible ileus versus ischemic bowel Serosanguinous sternal drainage seems to have decreased/stopped S/P MVR with mechanical valve, INR 2.4 today, warfarin on hold for now Nafcillin for MSSA bacterial endocarditis, flagyl added since aspiration event   Agree w/ plans outlined by Dr Tamala Julian and Dr Marval Regal  Will plan CT scan abdomen/pelvis r/o ischemic bowel, CT chest to evaluate sternal drainage  Wean nitric oxide  Wean PEEP to 8 cm H2O to help w/ BP  Wean levophed and vasopressin as tolerated  CRRT  and keep even for now  Continue IV nafcillin and flagyl - consider improved GNR coverage  Discussed prognosis at bedside w/ patient's wife  Rexene Alberts, MD 05/07/2020 9:41 AM

## 2020-05-08 ENCOUNTER — Inpatient Hospital Stay (HOSPITAL_COMMUNITY): Payer: BC Managed Care – PPO

## 2020-05-08 DIAGNOSIS — L02413 Cutaneous abscess of right upper limb: Secondary | ICD-10-CM | POA: Diagnosis not present

## 2020-05-08 DIAGNOSIS — I340 Nonrheumatic mitral (valve) insufficiency: Secondary | ICD-10-CM | POA: Diagnosis not present

## 2020-05-08 LAB — COMPREHENSIVE METABOLIC PANEL
ALT: 238 U/L — ABNORMAL HIGH (ref 0–44)
ALT: 572 U/L — ABNORMAL HIGH (ref 0–44)
AST: 767 U/L — ABNORMAL HIGH (ref 15–41)
AST: 3046 U/L — ABNORMAL HIGH (ref 15–41)
Albumin: 2.3 g/dL — ABNORMAL LOW (ref 3.5–5.0)
Albumin: 2.1 g/dL — ABNORMAL LOW (ref 3.5–5.0)
Alkaline Phosphatase: 249 U/L — ABNORMAL HIGH (ref 38–126)
Alkaline Phosphatase: 253 U/L — ABNORMAL HIGH (ref 38–126)
Anion gap: 20 — ABNORMAL HIGH (ref 5–15)
Anion gap: 21 — ABNORMAL HIGH (ref 5–15)
BUN: 38 mg/dL — ABNORMAL HIGH (ref 6–20)
BUN: 33 mg/dL — ABNORMAL HIGH (ref 6–20)
CO2: 16 mmol/L — ABNORMAL LOW (ref 22–32)
CO2: 15 mmol/L — ABNORMAL LOW (ref 22–32)
Calcium: 6.6 mg/dL — ABNORMAL LOW (ref 8.9–10.3)
Calcium: 6.4 mg/dL — CL (ref 8.9–10.3)
Chloride: 99 mmol/L (ref 98–111)
Chloride: 97 mmol/L — ABNORMAL LOW (ref 98–111)
Creatinine, Ser: 4.27 mg/dL — ABNORMAL HIGH (ref 0.61–1.24)
Creatinine, Ser: 3.41 mg/dL — ABNORMAL HIGH (ref 0.61–1.24)
GFR, Estimated: 16 mL/min — ABNORMAL LOW (ref >60)
GFR, Estimated: 21 mL/min — ABNORMAL LOW (ref >60)
Glucose, Bld: 99 mg/dL (ref 70–99)
Glucose, Bld: 141 mg/dL — ABNORMAL HIGH (ref 70–99)
Potassium: 6 mmol/L — ABNORMAL HIGH (ref 3.5–5.1)
Potassium: 5.6 mmol/L — ABNORMAL HIGH (ref 3.5–5.1)
Sodium: 135 mmol/L (ref 135–145)
Sodium: 133 mmol/L — ABNORMAL LOW (ref 135–145)
Total Bilirubin: 7.8 mg/dL — ABNORMAL HIGH (ref 0.3–1.2)
Total Bilirubin: 8.8 mg/dL — ABNORMAL HIGH (ref 0.3–1.2)
Total Protein: 6.8 g/dL (ref 6.5–8.1)
Total Protein: 6.6 g/dL (ref 6.5–8.1)

## 2020-05-08 LAB — RENAL FUNCTION PANEL
Albumin: 2.1 g/dL — ABNORMAL LOW (ref 3.5–5.0)
Anion gap: 21 — ABNORMAL HIGH (ref 5–15)
BUN: 30 mg/dL — ABNORMAL HIGH (ref 6–20)
CO2: 17 mmol/L — ABNORMAL LOW (ref 22–32)
Calcium: 6.6 mg/dL — ABNORMAL LOW (ref 8.9–10.3)
Chloride: 98 mmol/L (ref 98–111)
Creatinine, Ser: 2.99 mg/dL — ABNORMAL HIGH (ref 0.61–1.24)
GFR, Estimated: 24 mL/min — ABNORMAL LOW (ref >60)
Glucose, Bld: 120 mg/dL — ABNORMAL HIGH (ref 70–99)
Phosphorus: 9.4 mg/dL — ABNORMAL HIGH (ref 2.5–4.6)
Potassium: 5.8 mmol/L — ABNORMAL HIGH (ref 3.5–5.1)
Sodium: 136 mmol/L (ref 135–145)

## 2020-05-08 LAB — CBC
HCT: 28.6 % — ABNORMAL LOW (ref 39.0–52.0)
HCT: 29.3 % — ABNORMAL LOW (ref 39.0–52.0)
Hemoglobin: 8.8 g/dL — ABNORMAL LOW (ref 13.0–17.0)
Hemoglobin: 8.8 g/dL — ABNORMAL LOW (ref 13.0–17.0)
MCH: 30 pg (ref 26.0–34.0)
MCH: 30.9 pg (ref 26.0–34.0)
MCHC: 30 g/dL (ref 30.0–36.0)
MCHC: 30.8 g/dL (ref 30.0–36.0)
MCV: 100 fL (ref 80.0–100.0)
MCV: 100.4 fL — ABNORMAL HIGH (ref 80.0–100.0)
Platelets: 245 10*3/uL (ref 150–400)
Platelets: 294 10*3/uL (ref 150–400)
RBC: 2.85 MIL/uL — ABNORMAL LOW (ref 4.22–5.81)
RBC: 2.93 MIL/uL — ABNORMAL LOW (ref 4.22–5.81)
RDW: 19.1 % — ABNORMAL HIGH (ref 11.5–15.5)
RDW: 19.6 % — ABNORMAL HIGH (ref 11.5–15.5)
WBC: 42.7 10*3/uL — ABNORMAL HIGH (ref 4.0–10.5)
WBC: 43.4 10*3/uL — ABNORMAL HIGH (ref 4.0–10.5)
nRBC: 0.2 % (ref 0.0–0.2)
nRBC: 0.5 % — ABNORMAL HIGH (ref 0.0–0.2)

## 2020-05-08 LAB — ECHOCARDIOGRAM LIMITED
Area-P 1/2: 2.53 cm2
Height: 75 in
Weight: 4920.67 oz

## 2020-05-08 LAB — COOXEMETRY PANEL
Carboxyhemoglobin: 0.7 % (ref 0.5–1.5)
Methemoglobin: 1.1 % (ref 0.0–1.5)
O2 Saturation: 81 %
Total hemoglobin: 8.9 g/dL — ABNORMAL LOW (ref 12.0–16.0)

## 2020-05-08 LAB — GLUCOSE, CAPILLARY
Glucose-Capillary: 106 mg/dL — ABNORMAL HIGH (ref 70–99)
Glucose-Capillary: 113 mg/dL — ABNORMAL HIGH (ref 70–99)
Glucose-Capillary: 114 mg/dL — ABNORMAL HIGH (ref 70–99)
Glucose-Capillary: 122 mg/dL — ABNORMAL HIGH (ref 70–99)
Glucose-Capillary: 198 mg/dL — ABNORMAL HIGH (ref 70–99)
Glucose-Capillary: 61 mg/dL — ABNORMAL LOW (ref 70–99)
Glucose-Capillary: 66 mg/dL — ABNORMAL LOW (ref 70–99)
Glucose-Capillary: 73 mg/dL (ref 70–99)
Glucose-Capillary: 79 mg/dL (ref 70–99)
Glucose-Capillary: 80 mg/dL (ref 70–99)
Glucose-Capillary: 83 mg/dL (ref 70–99)
Glucose-Capillary: 95 mg/dL (ref 70–99)

## 2020-05-08 LAB — MAGNESIUM
Magnesium: 3 mg/dL — ABNORMAL HIGH (ref 1.7–2.4)
Magnesium: 3.2 mg/dL — ABNORMAL HIGH (ref 1.7–2.4)

## 2020-05-08 LAB — LACTIC ACID, PLASMA: Lactic Acid, Venous: 9.5 mmol/L (ref 0.5–1.9)

## 2020-05-08 LAB — PROTIME-INR
INR: 3.6 — ABNORMAL HIGH (ref 0.8–1.2)
Prothrombin Time: 34.9 seconds — ABNORMAL HIGH (ref 11.4–15.2)

## 2020-05-08 LAB — PHOSPHORUS: Phosphorus: 10.4 mg/dL — ABNORMAL HIGH (ref 2.5–4.6)

## 2020-05-08 IMAGING — DX DG CHEST 1V PORT
1 series · 1 of 1 positions shown · non-contrast
Comparison: Chest x-rays dated [DATE]

CLINICAL DATA: Respiratory failure

EXAM:
PORTABLE CHEST 1 VIEW

[chest]
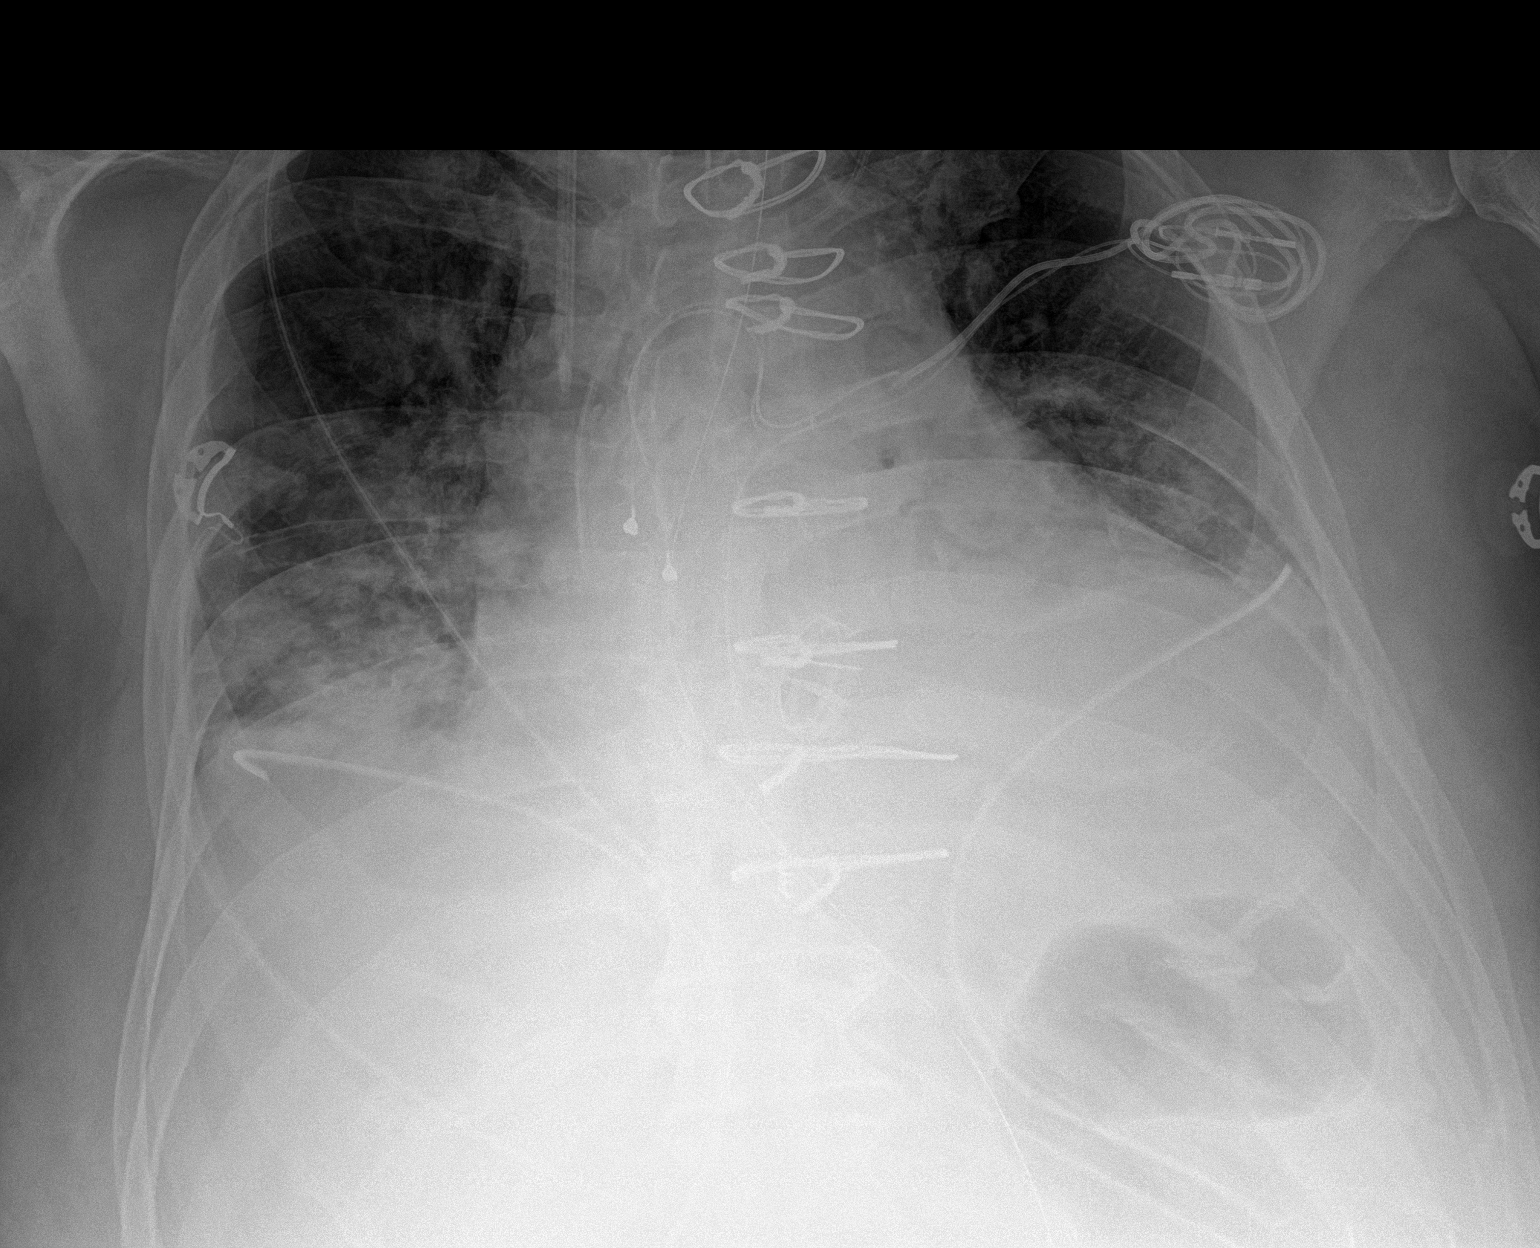

[1 of 1 positions shown; findings below may reference images not displayed]

FINDINGS: Support apparatus appears stable in position. Heart size and
mediastinal contours are stable. The bilateral airspace opacities
are unchanged. Small bilateral pleural effusions better demonstrated
on yesterday's chest CT. Median sternotomy wires in place.
IMPRESSION: Stable chest x-ray. Diffuse bilateral airspace opacities are
unchanged, compatible with multifocal pneumonia versus pulmonary
edema.

## 2020-05-08 MED ORDER — DEXTROSE 10 % IV SOLN: INTRAVENOUS | Status: DC

## 2020-05-08 MED ORDER — PRISMASOL BGK 0/2.5 32-2.5 MEQ/L EC SOLN
Status: DC
  Filled 2020-05-08 (×12): qty 5000

## 2020-05-08 MED ORDER — SODIUM CHLORIDE 0.9 % IV SOLN
2.0000 g | Freq: Two times a day (BID) | INTRAVENOUS | Status: DC
  Administered 2020-05-08 (×2): 2 g via INTRAVENOUS
  Filled 2020-05-08 (×2): qty 2

## 2020-05-08 MED ORDER — DEXTROSE 10 % IV SOLN: INTRAVENOUS | Status: AC

## 2020-05-08 MED ORDER — PRISMASOL BGK 0/2.5 32-2.5 MEQ/L EC SOLN
Status: DC
  Filled 2020-05-08 (×3): qty 5000

## 2020-05-08 MED ORDER — VANCOMYCIN HCL 1250 MG/250ML IV SOLN
1250.0000 mg | INTRAVENOUS | Status: DC
  Filled 2020-05-08: qty 250

## 2020-05-08 MED ORDER — PRISMASOL BGK 0/2.5 32-2.5 MEQ/L EC SOLN
Status: DC
  Filled 2020-05-08 (×4): qty 5000

## 2020-05-08 MED ORDER — VANCOMYCIN HCL 2000 MG/400ML IV SOLN
2000.0000 mg | Freq: Once | INTRAVENOUS | Status: AC
  Administered 2020-05-08: 2000 mg via INTRAVENOUS
  Filled 2020-05-08: qty 400

## 2020-05-08 MED ORDER — SODIUM ZIRCONIUM CYCLOSILICATE 10 G PO PACK
10.0000 g | PACK | Freq: Three times a day (TID) | ORAL | Status: DC
  Filled 2020-05-08: qty 1

## 2020-05-08 NOTE — Progress Notes (Signed)
Pharmacy Antibiotic Note  Jonathan Shaffer is a 54 y.o. male admitted on 04/10/2020 with MSSA bacteremia and sepsis.  Pharmacy has been consulted for cefepime and vancomycin dosing.  On planned 6 weeks of nafcillin for MSSA endocarditis, now with worsening suspected sepsis this AM, and pharmacy asked to broaden to vancomycin and cefepime.  Also with potential drug rash on torso from nafcillin?  CRRT initiated 12/4.  Plan: Vancomycin 2g IV x 1 now, then vancomycin 1250 mg IV q 24 hrs while on CRRT. Cefepime 2g IV q 12 hrs while on CRRT.  Height: 6\' 3"  (190.5 cm) Weight: (!) 139.5 kg (307 lb 8.7 oz) IBW/kg (Calculated) : 84.5  Temp (24hrs), Avg:98.2 F (36.8 C), Min:97.1 F (36.2 C), Max:99.1 F (37.3 C)  Recent Labs  Lab 05/06/20 0053 05/06/20 0053 05/06/20 0537 05/06/20 1335 05/07/20 0206 05/07/20 1639 05/08/20 0035  WBC 22.5*  --  15.5* 14.5* 37.2*  --  42.7*  CREATININE 3.06*   < > 3.49* 3.60* 4.27* 4.80* 4.27*   < > = values in this interval not displayed.    Estimated Creatinine Clearance: 29.8 mL/min (A) (by C-G formula based on SCr of 4.27 mg/dL (H)).    No Known Allergies  Antimicrobials this admission: 11/19 Zosyn x 1 11/19 cefepime >> 11/20; 12/5 >  11/19 vancomycin >> 11/20; 12/5 >  11/20 nafcillin >> (6 wks min from 11/23) 12/3 Zosyn > 12/3 12/3 Flagyl >>  Dose adjustments this admission:  Microbiology results: 11/19 MRSA PCR neg 11/19 Bcx: 4/4 MSSA 11/19 Ucx: MSSA 11/21 Bcx: negF 11/22 abscess tissue: MSSA 11/23 Bcx: negF 11/27 MV vegetation: MSSA 12/4 R shoulder wound - pending  Thank you for allowing pharmacy to be a part of this patient's care.  [place] Crane, Roylene Reason, BCCP Clinical Pharmacist  05/08/2020 9:12 AM   [facility] pharmacy phone numbers are listed on [email or web address]

## 2020-05-08 NOTE — Progress Notes (Addendum)
CRITICAL VALUE ALERT  Critical Value:  Lactic Acid 9.5 / Ca 6.4  Date & Time Notied:  05/08/20 1111  Provider Notified: Ina Homes MD  Orders Received/Actions taken: No new orders

## 2020-05-08 NOTE — Procedures (Signed)
Admit: 04/07/2020 LOS: 16  Developed A fib last night and started on amiodarone.  Still requiring pressors  Current CRRT Prescription: Start Date: 05/07/20 Catheter: 05/07/20 BFR: 200-450 Pre Blood Pump: 500 ml/hr DFR: 1500 ml/hr Replacement Rate: 300 ml/hr Goal UF: keep even Anticoagulation: on systemic anticoagulation for MVR Clotting:  none    S: Tolerating CRRT  O: 12/04 0701 - 12/05 0700 In: 4938.7 [I.V.:4065.5; NG/GT:30; IV Piggyback:843.2] Out: 3382 [Urine:57; Emesis/NG output:400; Chest Tube:500]  Filed Weights   05/06/20 0600 05/07/20 0500 05/08/20 0500  Weight: 135.4 kg 134.1 kg (!) 139.5 kg    Recent Labs  Lab 05/06/20 0053 05/06/20 0537 05/07/20 0206 05/07/20 0206 05/07/20 1111 05/07/20 1639 05/08/20 0035  NA 137   < > 138   < > 138 139 135  K 3.2*   < > 4.7   < > 5.0 5.5* 6.0*  CL 99   < > 98  --   --  101 99  CO2 24   < > 20*  --   --  21* 16*  GLUCOSE 107*   < > 85  --   --  99 99  BUN 33*   < > 39*  --   --  46* 38*  CREATININE 3.06*   < > 4.27*  --   --  4.80* 4.27*  CALCIUM 7.6*   < > 7.0*  --   --  6.6* 6.6*  PHOS 6.7*  --  10.4*  --   --  11.9*  --    < > = values in this interval not displayed.   Recent Labs  Lab 05/05/20 0313 05/05/20 0313 05/06/20 0053 05/06/20 0537 05/06/20 1335 05/06/20 1335 05/07/20 0206 05/07/20 1111 05/08/20 0035  WBC 23.5*   < > 22.5*   < > 14.5*  --  37.2*  --  42.7*  NEUTROABS 20.3*  --  19.8*  --   --   --  34.3*  --   --   HGB 9.6*   < > 10.5*   < > 8.9*   < > 8.3* 8.8* 8.8*  HCT 28.8*   < > 32.1*   < > 26.8*   < > 26.1* 26.0* 29.3*  MCV 90.9   < > 90.4   < > 92.7  --  93.9  --  100.0  PLT 334   < > 407*   < > 322  --  292  --  294   < > = values in this interval not displayed.    Scheduled Meds: . acetaminophen (TYLENOL) oral liquid 160 mg/5 mL  500 mg Per Tube Q8H  . aspirin  150 mg Rectal Daily  . bisacodyl  10 mg Oral Daily   Or  . bisacodyl  10 mg Rectal Daily  . bisacodyl  10 mg Rectal  BID  . chlorhexidine gluconate (MEDLINE KIT)  15 mL Mouth Rinse BID  . Chlorhexidine Gluconate Cloth  6 each Topical Daily  . hydrocortisone sod succinate (SOLU-CORTEF) inj  50 mg Intravenous Q6H  . insulin aspart  0-24 Units Subcutaneous Q4H  . lidocaine  1 patch Transdermal Daily  . mouth rinse  15 mL Mouth Rinse 10 times per day  . metoCLOPramide (REGLAN) injection  10 mg Intravenous Q6H  . neostigmine  0.25 mg Intramuscular Q6H  . pantoprazole (PROTONIX) IV  40 mg Intravenous Q24H  . Warfarin - Pharmacist Dosing Inpatient   Does not apply q1600   Continuous Infusions: .  amiodarone    . ceFEPime (MAXIPIME) IV 2 g (05/08/20 1020)  . fentaNYL infusion INTRAVENOUS 150 mcg/hr (05/08/20 0700)  . metronidazole 100 mL/hr at 05/08/20 0700  . norepinephrine (LEVOPHED) Adult infusion 14 mcg/min (05/08/20 0700)  . prismasol BGK 2/2.5 dialysis solution 1,500 mL/hr at 05/08/20 0752  . prismasol BGK 2/2.5 replacement solution 500 mL/hr at 05/08/20 0424  . prismasol BGK 2/2.5 replacement solution 300 mL/hr at 05/08/20 0425  . propofol (DIPRIVAN) infusion 10 mcg/kg/min (05/08/20 0700)  . [START ON 2020/05/16] vancomycin    . vasopressin 0.04 Units/min (05/08/20 0700)   PRN Meds:.dextrose, fentaNYL, heparin, heparin, lip balm, metoprolol tartrate, ondansetron (ZOFRAN) IV  ABG    Component Value Date/Time   PHART 7.357 05/07/2020 1111   PCO2ART 42.8 05/07/2020 1111   PO2ART 129 (H) 05/07/2020 1111   HCO3 24.0 05/07/2020 1111   TCO2 25 05/07/2020 1111   ACIDBASEDEF 1.0 05/07/2020 1111   O2SAT 81.0 05/08/2020 1003    A/P  1. Dialysis dependent AKI with multiple renal insults but more recently ischemic ATN in setting of sepsis due to aspiration pna.      Donato Heinz, MD [place] Pager: [phone] Office [phone]

## 2020-05-08 NOTE — Progress Notes (Signed)
TCTS BRIEF SICU PROGRESS NOTE  8 Days Post-Op  S/P Procedure(s) (LRB): MITRAL VALVE (MV) REPLACEMENT USING ON-X MITRAL VALVE SIZE 31/33 MM,  PERMANENT EPICARDIAL PACEMAKING SYSTEM IMPLANT USING MEDTRONIC LEADS (N/A) TRANSESOPHAGEAL ECHOCARDIOGRAM (TEE) (N/A)   No significant changes over the course of the day Remains critically ill in MSOF VVI paced w/ stable MAP although levophed now up to 20 O2 sats 100% on 80% FiO2 PEEP 12  Plan: Continue current plan  Rexene Alberts, MD 05/08/2020 5:09 PM

## 2020-05-08 NOTE — Plan of Care (Signed)
  Problem: Clinical Measurements: Goal: Ability to maintain clinical measurements within normal limits will improve Outcome: Not Progressing Goal: Will remain free from infection Outcome: Not Progressing Goal: Diagnostic test results will improve Outcome: Not Progressing Goal: Respiratory complications will improve Outcome: Not Progressing Goal: Cardiovascular complication will be avoided Outcome: Not Progressing

## 2020-05-08 NOTE — Progress Notes (Addendum)
NAME:  Jonathan Shaffer, MRN:  [id number], DOB:  [birth year], LOS: 36 ADMISSION DATE:  04/24/2020, CONSULTATION DATE:  04/23/20 REFERRING MD:  Karleen Hampshire, CHIEF COMPLAINT:  Septic shock  Brief History   MSSA bacteremia +/- AC septic arthritis, septic emboli to spleen  History of present illness   Jonathan Shaffer is a 54 y/o gentleman admitted with sepsis, found to have MSSA bacteremia. He developed sudden onset nausea and vomiting and chills last week and later developed fevers and R shoulder pain. He was evaluated and felt to have a sprain. He was treated with an OP steroid injection and started oral steroids. He developed a painful rash on his hand. His progressive malaise, fevers, rash, and joint pain prompted return to the ED. At presentation he was febrile, tachycardic, tachypneic, with lactic acidosis. He was in DKA at presentation. He has a previous history of "high diabetes markers", which he reports he was not following up on. He was started on empiric vanc, zosyn & cefepime, and doxycycline. He has been evaluated by orthopaedics for possible septic arthritis of his R AC joint. ID has been consulted for MSSA+ blood cultures. No known history of IVDU or injuries prior to becoming ill. He has been encephalopathic this admission, prompting concern for septic emboli to the brain; MRI pending.  Past Medical History  Uncontrolled DM Testicular cancer; in remission [facility] Events   Started vasopressors 11/20 MV replacement 11/27 12/2 aspiration event  Consults:  ID Ortho PCCM TCTS Nephrology  Procedures:  PICC  Mechanical MV replacement 11/27  Significant Diagnostic Tests:  MRI R shoulder> septic arthritis AC joint, possible intramuscular abscess, OM distal clavicle Echocardiogram 11/19> LVEF 55 to 60%, normal valves. LUE US> no DVT MRI left wrist> cellulitis, no septic arthritis  Micro Data:  Blood culture 11/19> MSSA  2/3 Urine culture 11/19> staph aureus Blood  culture 11/21> NG Shoulder culture 11/22> abundant MSSA Blood cultuers 11/23> NG Operative cultures 11/22> abundant WBC> rare MSSA  Antimicrobials:  Vanc 11/20 Zosyn 11/19 Cefepime 11/19- 11/20 Doxycycline 11/19 Nafcillin 11/20>  Interim history/subjective:  Unfortunately seems to have gone into liver failure overnight. Remains nearly comatose on vent.  Objective   Blood pressure (!) 96/57, pulse (!) 47, temperature (!) 97.1 F (36.2 C), temperature source Axillary, resp. rate (!) 26, height 6\' 3"  (1.905 m), weight (!) 139.5 kg, SpO2 93 %.    Vent Mode: PCV FiO2 (%):  [50 %-100 %] 100 % Set Rate:  [26 bmp] 26 bmp PEEP:  [8 BTD97-41 cmH20] 12 cmH20 Plateau Pressure:  [26 cmH20-31 cmH20] 26 cmH20   Intake/Output Summary (Last 24 hours) at 05/08/2020 0656 Last data filed at 05/08/2020 0600 Gross per 24 hour  Intake 4063.48 ml  Output 3384 ml  Net 679.48 ml   Filed Weights   05/06/20 0600 05/07/20 0500 05/08/20 0500  Weight: 135.4 kg 134.1 kg (!) 139.5 kg    Examination: Constitutional: intubated, sedated ill appearing  Eyes: pupils pinpoint, upward deviation Ears, nose, mouth, and throat: ETT in place, small thick secretions Cardiovascular: bradycardic, ext warm Respiratory: Diminished at bases, passive on vent Gastrointestinal: tympanic, distended, hypoactive BS Skin: No rashes, normal turgor Neurologic: GCS3, redated Psychiatric: cannot assess   WBC up again CBGs now low LFTs up K up despite HD Lactate pending  Resolved Hospital Problem list   Lactic acidosis Septic shock Hyponatremia  Assessment & Plan:   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  Acute respiratory failure post-operatively due to acute pulmonary edema, atelectasis, bilateral effusion- improving Aspiration event leading to profound hypoxemia 12/4, ARDS In hospital PEA arrest, severe  septic/distributive shock- suspicion for aspiration given enlarging abdomen, N/V preceding. Improved Encephalopathy- post arrest currently heavily sedated due to O2 needs Acute renal failure, refractory hyperkalemia Acute liver failure, refractory hypoglycemia Afib w/ SVR- now paced Ileus- contributing to FRC loss and respiratory failure, remains an issue, nothing actionable on CT C/A/P  - Start lokelma - Continue vent support - Broaden antibiotics as salvage - Unfortunately progressing to multiorgan failure without any obvious treatment options, will speak with wife today regarding goals of care  Best practice:  Diet: NPO Pain/Anxiety/Delirium protocol (if indicated): prop/fent titrated to vent synchrony VAP protocol (if indicated): in place DVT prophylaxis: warfarin/heparin GI prophylaxis: PPI Glucose control: SSI Mobility: BR Code Status: full Family Communication: will update Disposition: ICU   Patient critically ill due to severe shock, severe hypoxemic respiratory failure Interventions to address this today vent support, pressors, antibiotics,  Risk of deterioration without these interventions is high  I personally spent 38 minutes providing critical care not including any separately billable procedures  Erskine Emery MD [facility] 05/08/2020 6:56 AM Personal pager: [phone] If unanswered, please page CCM On-call: [phone]

## 2020-05-08 NOTE — Progress Notes (Signed)
  Echocardiogram 2D Echocardiogram has been performed.  Johny Chess 05/08/2020, 11:35 AM

## 2020-05-08 NOTE — Progress Notes (Signed)
[place] for warfarin > heparin while intubated Indication: On-X mitral valve   No Known Allergies  Patient Measurements: Height: 6\' 3"  (190.5 cm) Weight: (!) 139.5 kg (307 lb 8.7 oz) IBW/kg (Calculated) : 84.5  Vital Signs: Temp: 97.1 F (36.2 C) (12/04 2326) Temp Source: Axillary (12/04 2326) BP: 96/57 (12/05 0352) Pulse Rate: 70 (12/05 0730)  Labs: Recent Labs    05/06/20 1335 05/06/20 1335 05/07/20 0206 05/07/20 0206 05/07/20 1111 05/07/20 1639 05/08/20 0035 05/08/20 0652  HGB 8.9*   < > 8.3*   < > 8.8*  --  8.8*  --   HCT 26.8*   < > 26.1*  --  26.0*  --  29.3*  --   PLT 322  --  292  --   --   --  294  --   LABPROT 27.3*  --  25.6*  --   --   --   --  34.9*  INR 2.6*  --  2.4*  --   --   --   --  3.6*  CREATININE 3.60*   < > 4.27*  --   --  4.80* 4.27*  --    < > = values in this interval not displayed.    Estimated Creatinine Clearance: 29.8 mL/min (A) (by C-G formula based on SCr of 4.27 mg/dL (H)).   Medical History: Past Medical History:  Diagnosis Date  . Cancer ([place])   . Diabetes mellitus without complication ([place])   . High cholesterol   . Testicle cancer ([place])   . Thyroid disease   . 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 Hgb stable post-op, currently at 8.8, plt wnl. No bleeding noted.   INR with significant increase yesterday from 2.4 > 3.6 despite no Coumadin.   LFTs back up today, CBC low, stable.   Goal of Therapy:  INR goal 2.5-3.5 Monitor platelets by anticoagulation protocol: Yes   Plan:  Continue to hold warfarin. Per discussion with CCM/TCTS - will start IV heparin when INR < 2   [place] Crane, Roylene Reason, [facility] Clinical Pharmacist  05/08/2020 9:17 AM   [facility] pharmacy phone numbers are listed on [email or web address]

## 2020-05-08 NOTE — Progress Notes (Addendum)
[address]       [place] [address]             [phone]        CARDIOTHORACIC SURGERY PROGRESS NOTE   R8 Days Post-Op Procedure(s) (LRB): MITRAL VALVE (MV) REPLACEMENT USING ON-X MITRAL VALVE SIZE 31/33 MM,  PERMANENT EPICARDIAL PACEMAKING SYSTEM IMPLANT USING MEDTRONIC LEADS (N/A) TRANSESOPHAGEAL ECHOCARDIOGRAM (TEE) (N/A)  Subjective: Unresponsive on vent.  Objective: Vital signs: BP Readings from Last 1 Encounters:  05/08/20 (!) 96/57   Pulse Readings from Last 1 Encounters:  05/08/20 70   Resp Readings from Last 1 Encounters:  05/08/20 (!) 26   Temp Readings from Last 1 Encounters:  05/07/20 (!) 97.1 F (36.2 C) (Axillary)    Hemodynamics:  CVP 16 Mixed venous co-ox 81%   Physical Exam:  Rhythm:   sinus  Breath sounds: Coarse, symmetrical  Heart sounds:  RRR  Incisions:  Dressing dry  Abdomen:  Soft, moderately distended, quiet  Extremities:  Swollen, adequately perfused  Chest tubes:  moderate volume thin serosanguinous output, no air leak    Intake/Output from previous day: 12/04 0701 - 12/05 0700 In: 4938.7 [I.V.:4065.5; NG/GT:30; IV Piggyback:843.2] Out: 7902 [Urine:57; Emesis/NG output:400; Chest Tube:500] Intake/Output this shift: No intake/output data recorded.  Lab Results:  CBC: Recent Labs    05/07/20 0206 05/07/20 0206 05/07/20 1111 05/08/20 0035  WBC 37.2*  --   --  42.7*  HGB 8.3*   < > 8.8* 8.8*  HCT 26.1*   < > 26.0* 29.3*  PLT 292  --   --  294   < > = values in this interval not displayed.    BMET:  Recent Labs    05/07/20 1639 05/08/20 0035  NA 139 135  K 5.5* 6.0*  CL 101 99  CO2 21* 16*  GLUCOSE 99 99  BUN 46* 38*  CREATININE 4.80* 4.27*  CALCIUM 6.6* 6.6*     PT/INR:   Recent Labs    05/08/20 0652  LABPROT 34.9*  INR 3.6*    CBG (last 3)  Recent Labs    05/08/20 0405 05/08/20 0408 05/08/20 0626  GLUCAP 66* 80 83    ABG    Component Value Date/Time   PHART  7.357 05/07/2020 1111   PCO2ART 42.8 05/07/2020 1111   PO2ART 129 (H) 05/07/2020 1111   HCO3 24.0 05/07/2020 1111   TCO2 25 05/07/2020 1111   ACIDBASEDEF 1.0 05/07/2020 1111   O2SAT 99.0 05/07/2020 1111    Results for Jonathan Shaffer, Jonathan Shaffer (MRN [id number]) as of 05/08/2020 13:05  Ref. Range 05/08/2020 10:03  Lactic Acid, Venous Latest Ref Range: 0.5 - 1.9 mmol/L 9.5 (HH)     CXR: PORTABLE CHEST 1 VIEW  COMPARISON:  Chest x-rays dated 05/07/2020  FINDINGS: Support apparatus appears stable in position. Heart size and mediastinal contours are stable. The bilateral airspace opacities are unchanged. Small bilateral pleural effusions better demonstrated on yesterday's chest CT. Median sternotomy wires in place.  IMPRESSION: Stable chest x-ray. Diffuse bilateral airspace opacities are unchanged, compatible with multifocal pneumonia versus pulmonary edema.   Electronically Signed   By: Franki Cabot M.D.   On: 05/08/2020 08:55   ECHO:   ECHOCARDIOGRAM LIMITED REPORT       Patient Name:  Jonathan Shaffer Date of Exam: 05/08/2020  Medical Rec #: [id number]   Height:    75.0 in  Accession #:  [id number]   Weight:    307.5  lb  Date of Birth: [birth year]   BSA:     2.635 m  Patient Age:  54 years    BP:      96/57 mmHg  Patient Gender: M       HR:      79 bpm.  Exam Location: Inpatient   Procedure: Limited Echo, Limited Color Doppler and Cardiac Doppler   STAT ECHO   Indications:  mitral vavle insufficiency 424.0    History:    Patient has prior history of Echocardiogram examinations,  most         recent 05/06/2020. Sepsis; Risk Factors:Diabetes.           Mitral Valve: 33 mm On-X mechanical valve valve is present  in         the mitral position. Procedure Date: 05/02/2020.    Sonographer:  Johny Chess  Referring Phys: [id number] Candee Furbish     Sonographer Comments: Echo  performed with patient supine and on artificial  respirator and Technically difficult study due to poor echo windows. Image  acquisition challenging due to patient body habitus.  IMPRESSIONS    1. Left ventricular ejection fraction, by estimation, is 55 to 60%. The  left ventricle has normal function.  2. Right ventricular systolic function was not well visualized. The right  ventricular size is not well visualized.  3. The mitral valve has been repaired/replaced. The mean mitral valve  gradient is 3.0 mmHg. There is a 33 mm On-X mechanical valve present in  the mitral position. Procedure Date: 04/09/2020. Echo findings are  consistent with normal structure and  function of the mitral valve prosthesis.  4. The aortic valve is grossly normal. Aortic valve regurgitation is not  visualized.   Conclusion(s)/Recommendation(s): I did not appreciate the previously  commented on wall motion abnormality in the basal inferior wall, though  per prior echo this appeared normal with contrast. Apical images are  somewhat difficult to assess wall motion,  but parasternal images show normal EF and normal wall motion. The recently  placed mitral valve appears well seated without significant abnormalities.   FINDINGS  Left Ventricle: Left ventricular ejection fraction, by estimation, is 55  to 60%. The left ventricle has normal function. Abnormal (paradoxical)  septal motion consistent with post-operative status.   Right Ventricle: The right ventricular size is not well visualized. Right  ventricular systolic function was not well visualized.   Left Atrium: Left atrial size was not well visualized.   Right Atrium: Right atrial size was not well visualized.   Pericardium: The pericardium was not well visualized.   Mitral Valve: The mitral valve has been repaired/replaced. There is a 33  mm On-X mechanical valve present in the mitral position. Procedure Date:  04/25/2020. Echo findings are  consistent with normal structure and  function of the mitral valve prosthesis.  MV peak gradient, 10.4 mmHg. The mean mitral valve gradient is 3.0 mmHg.   Tricuspid Valve: The tricuspid valve is grossly normal. Tricuspid valve  regurgitation is trivial.   Aortic Valve: The aortic valve is grossly normal. Aortic valve  regurgitation is not visualized.   Pulmonic Valve: The pulmonic valve was not well visualized.   Venous: The inferior vena cava was not well visualized.   MITRAL VALVE  MV Area (PHT): 2.53 cm  MV Peak grad: 10.4 mmHg  MV Mean grad: 3.0 mmHg  MV Vmax:    1.61 m/s  MV Vmean:   71.5 cm/s   Buford Dresser MD  Electronically signed by Buford Dresser MD  Signature Date/Time: 05/08/2020/11:49:22 AM       Assessment/Plan: S/P Procedure(s) (LRB): MITRAL VALVE (MV) REPLACEMENT USING ON-X MITRAL VALVE SIZE 31/33 MM,  PERMANENT EPICARDIAL PACEMAKING SYSTEM IMPLANT USING MEDTRONIC LEADS (N/A) TRANSESOPHAGEAL ECHOCARDIOGRAM (TEE) (N/A)  Patient has developed progressive MSOF with worsening lactic acidosis and evidence of acute liver failure, rising LFT's and INR Currently Afib under pacer w/ stable BP on levophed 14 and vasopressin 0.04 for BP support w/ CVP 16 and mixed venous co-ox 81% - amiodarone stopped due to intermittent bradycardia and acute liver failure Limited ECHO at bedside w/ normal LV function and normal functioning mechanical mitral valve prosthesis w/out obvious paravalvular leak, RV not well visualized but no clinical signs of RV failure Acute hypoxemic respiratory failure w/ ARDS, O2 sats 100% on 80% FiO2 PEEP 12, CXR stable, recent respiratory arrest suspicious for aspiration Acute oliguric renal failure on CRRT w/out any attempts at volume removal Afebrile w/ worsening leukocytosis WBC up to 43k today on broad-spectrum antibiotics Unresponsive on vent since respiratory arrest, neuro status unclear   Continue current support w/ plan  outlined by Dr Tamala Julian  Updated patient's wife briefly at bedside   Rexene Alberts, MD 05/08/2020 9:24 AM

## 2020-05-08 NOTE — Progress Notes (Signed)
[facility] Progress Note Patient Name: Jonathan Shaffer DOB: 10-27-1965 MRN: [id number]   Date of Service  05/08/2020  HPI/Events of Note  CBG's 60's- 70's requiring amps of D50, Asking for Continous Dextrose  Leukocytosis/sepsis. On steroids/ abx. Anemia at 8.8 Cr 4.8, CRRT. on 2 pressors. On Ventilator. Elevated AST/ALT.    eICU Interventions  Start D10 at 10 ml/hr. Call back if blood sugar > 180 or under 80.      Intervention Category Intermediate Interventions: Other:  Elmer Sow 05/08/2020, 1:14 AM

## 2020-05-08 NOTE — Progress Notes (Signed)
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)   AKI (acute kidney injury) ([place])   Endocarditis of mitral valve   Bacteremia   S/P MVR (mitral valve repair)   Embolic disease of toe (HCC)   Venous insufficiency (chronic) (peripheral)   History of open heart surgery   Foot lesion   Antibiotics:  Total days of antibiotics 16             Nafcillin 11/20 >>             Vancomycin 12/5-             Cefepime 12/5-             Piptazo 12/3  Assessment 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 RT shoulder swab 12/4 GPC in pairs and chains, NGTD  Acute Respiratory Failure in the setting of Cardiac arrest. CT chest abdomen/pelvis with worsening findings concerning for pulmonary edema and diffuse infection  AKI - Nephrology following, started on CRRT  Septic Shock/Shock liver-  in the setting of PEA arrest  Leukocytosis/Lactic acidosis - uptrending  Recommendations Vancomycin and cefepime was added given continued clinical worsening and progressing to multiorgan failure as SALVAGE I have ordered 2 sets of blood cultures. Last blood cx were on 04/26/20 Poor prognosis Planned for Goals of care discussion by primary No new recommendations   ID team will follow on Monday   Rest of the management as per the primary team. Thank you for the consult. Please page with pertinent  questions or concerns.  Rosiland Oz, MD Infectious [facility]   To contact the attending provider between 8A-5P or the covering provider during after hours 5P-8A, please log into the web site [email or web address] and access using universal [place] password for that web site. If you do not have the password, please call the hospital operator. ______________________________________________________________________ Subjective patient seen and examined at the bedside. Wife at bedside. Uptrending leukocytosis. Worsening findings of pul edema vs diffuse infection in CT chest abd/pelvis. Started on CRRT.   Continues to be on 2 pressors ( vaso and levo)   Vitals BP (!) 96/57   Pulse 70   Temp (!) 97.1 F (36.2 C) (Axillary)   Resp (!) 26   Ht 6\' 3"  (1.905 m)   Wt (!) 139.5 kg   SpO2 100%   BMI 38.44 kg/m   Physical Exam Constitutional:      Comments: Oedematous body   Cardiovascular:     Rate and Rhythm: Normal rate and regular rhythm.     Heart sounds: No murmur heard.   Pulmonary:  Effort: On vent     Comments:   Abdominal:     Palpations: Abdomen is distended and tense but soft     Tenderness:   Musculoskeletal:        General: diffuse swelling  Skin:    Comments:Cold bilateral feet and necrotic Left foot  Neurological:     General: Unable to assess    LINES/TUBES:left IJ CVC  METAL IMPLANT/HARDWARE:  Pertinent Microbiology Results for orders placed or performed during the hospital encounter of 04/16/2020  Blood Culture (routine x 2)     Status: Abnormal   Collection Time: 04/21/2020  2:00 AM   Specimen: BLOOD RIGHT HAND  Result Value Ref Range Status   Specimen Description   Final    BLOOD RIGHT HAND Performed at [facility], [address] [place], [place] [address]    Special Requests   Final    BOTTLES DRAWN AEROBIC AND ANAEROBIC Blood Culture adequate volume Performed at [facility], [address] [place], [place] [address]    Culture  Setup Time   Final    GRAM POSITIVE COCCI IN BOTH AEROBIC AND ANAEROBIC BOTTLES CRITICAL RESULT CALLED TO, READ BACK BY AND VERIFIED WITHSeward Meth [facility] 2109 04/19/2020 A BROWNING Performed at [facility], [place] [address], [place], [place] [address]    Culture STAPHYLOCOCCUS AUREUS (A)  Final   Report Status 04/24/2020 FINAL  Final   Organism ID, Bacteria STAPHYLOCOCCUS AUREUS  Final      Susceptibility   Staphylococcus aureus - MIC*    CIPROFLOXACIN <=0.5 SENSITIVE Sensitive     ERYTHROMYCIN <=0.25 SENSITIVE Sensitive     GENTAMICIN <=0.5 SENSITIVE Sensitive     OXACILLIN 0.5 SENSITIVE Sensitive     TETRACYCLINE <=1 SENSITIVE Sensitive     VANCOMYCIN 1 SENSITIVE Sensitive     TRIMETH/SULFA <=10 SENSITIVE Sensitive     CLINDAMYCIN <=0.25 SENSITIVE Sensitive     RIFAMPIN <=0.5 SENSITIVE Sensitive     Inducible Clindamycin NEGATIVE Sensitive     * STAPHYLOCOCCUS AUREUS  Blood Culture ID Panel (Reflexed)     Status: Abnormal   Collection Time: 04/07/2020  2:00 AM  Result Value Ref Range Status   Enterococcus faecalis NOT DETECTED NOT DETECTED Final   Enterococcus Faecium NOT DETECTED NOT DETECTED Final   Listeria monocytogenes NOT DETECTED NOT DETECTED Final   Staphylococcus species DETECTED (A) NOT DETECTED Final    Comment: CRITICAL RESULT CALLED TO, READ BACK BY AND VERIFIED WITH: N GOLGOVAC PHARMD 2109 04/18/2020 A BROWNING    Staphylococcus aureus (BCID) DETECTED (A) NOT DETECTED Final    Comment: CRITICAL RESULT CALLED TO, READ BACK BY AND VERIFIED WITH: N GOLGOVAC PHARMD 2109 04/08/2020 A BROWNING    Staphylococcus epidermidis NOT DETECTED NOT DETECTED Final   Staphylococcus lugdunensis NOT DETECTED NOT DETECTED Final   Streptococcus species NOT DETECTED NOT DETECTED Final   Streptococcus agalactiae NOT DETECTED NOT DETECTED Final   Streptococcus pneumoniae NOT DETECTED NOT DETECTED Final   Streptococcus pyogenes NOT  DETECTED NOT DETECTED Final   A.calcoaceticus-baumannii NOT DETECTED NOT DETECTED Final   Bacteroides fragilis NOT DETECTED NOT DETECTED Final   Enterobacterales NOT DETECTED NOT DETECTED Final   Enterobacter cloacae complex NOT DETECTED NOT DETECTED Final   Escherichia coli NOT DETECTED NOT DETECTED Final   Klebsiella aerogenes NOT DETECTED NOT DETECTED Final   Klebsiella oxytoca NOT DETECTED NOT DETECTED Final   Klebsiella pneumoniae NOT DETECTED NOT DETECTED Final  Proteus species NOT DETECTED NOT DETECTED Final   Salmonella species NOT DETECTED NOT DETECTED Final   Serratia marcescens NOT DETECTED NOT DETECTED Final   Haemophilus influenzae NOT DETECTED NOT DETECTED Final   Neisseria meningitidis NOT DETECTED NOT DETECTED Final   Pseudomonas aeruginosa NOT DETECTED NOT DETECTED Final   Stenotrophomonas maltophilia NOT DETECTED NOT DETECTED Final   Candida albicans NOT DETECTED NOT DETECTED Final   Candida auris NOT DETECTED NOT DETECTED Final   Candida glabrata NOT DETECTED NOT DETECTED Final   Candida krusei NOT DETECTED NOT DETECTED Final   Candida parapsilosis NOT DETECTED NOT DETECTED Final   Candida tropicalis NOT DETECTED NOT DETECTED Final   Cryptococcus neoformans/gattii NOT DETECTED NOT DETECTED Final   Meth resistant mecA/C and MREJ NOT DETECTED NOT DETECTED Final    Comment: Performed at [facility], [address] [place], [place] [address]  Urine culture     Status: Abnormal   Collection Time: 04/20/2020  2:02 AM   Specimen: In/Out Cath Urine  Result Value Ref Range Status   Specimen Description IN/OUT CATH URINE  Final   Special Requests NONE  Final   Culture 40,000 COLONIES/mL STAPHYLOCOCCUS AUREUS (A)  Final   Report Status 04/24/2020 FINAL  Final   Organism ID, Bacteria STAPHYLOCOCCUS AUREUS (A)  Final      Susceptibility   Staphylococcus aureus - MIC*    CIPROFLOXACIN <=0.5 SENSITIVE Sensitive     GENTAMICIN <=0.5 SENSITIVE Sensitive      NITROFURANTOIN 32 SENSITIVE Sensitive     OXACILLIN <=0.25 SENSITIVE Sensitive     TETRACYCLINE <=1 SENSITIVE Sensitive     VANCOMYCIN 1 SENSITIVE Sensitive     TRIMETH/SULFA <=10 SENSITIVE Sensitive     CLINDAMYCIN <=0.25 SENSITIVE Sensitive     RIFAMPIN <=0.5 SENSITIVE Sensitive     Inducible Clindamycin NEGATIVE Sensitive     * 40,000 COLONIES/mL STAPHYLOCOCCUS AUREUS  Resp Panel by RT-PCR (Flu A&B, Covid) Nasopharyngeal Swab     Status: None   Collection Time: 04/21/2020  2:05 AM   Specimen: Nasopharyngeal Swab; Nasopharyngeal(NP) swabs in vial transport medium  Result Value Ref Range Status   SARS Coronavirus 2 by RT PCR NEGATIVE NEGATIVE Final    Comment: (NOTE) SARS-CoV-2 target nucleic acids are NOT DETECTED.  The SARS-CoV-2 RNA is generally detectable in upper respiratory specimens during the acute phase of infection. The lowest concentration of SARS-CoV-2 viral copies this assay can detect is 138 copies/mL. A negative result does not preclude SARS-Cov-2 infection and should not be used as the sole basis for treatment or other patient management decisions. A negative result may occur with  improper specimen collection/handling, submission of specimen other than nasopharyngeal swab, presence of viral mutation(s) within the areas targeted by this assay, and inadequate number of viral copies(<138 copies/mL). A negative result must be combined with clinical observations, patient history, and epidemiological information. The expected result is Negative.  Fact Sheet for Patients:  EntrepreneurPulse.com.au  Fact Sheet for Healthcare Providers:  IncredibleEmployment.be  This test is no t yet approved or cleared by the [place] FDA and  has been authorized for detection and/or diagnosis of SARS-CoV-2 by FDA under an Emergency Use Authorization (EUA). This EUA will remain  in effect (meaning this test can be used) for the duration of  the COVID-19 declaration under Section 564(b)(1) of the Act, 21 U.S.C.section 360bbb-3(b)(1), unless the authorization is terminated  or revoked sooner.       Influenza A by PCR NEGATIVE NEGATIVE  Final   Influenza B by PCR NEGATIVE NEGATIVE Final    Comment: (NOTE) The Xpert Xpress SARS-CoV-2/FLU/RSV plus assay is intended as an aid in the diagnosis of influenza from Nasopharyngeal swab specimens and should not be used as a sole basis for treatment. Nasal washings and aspirates are unacceptable for Xpert Xpress SARS-CoV-2/FLU/RSV testing.  Fact Sheet for Patients: EntrepreneurPulse.com.au  Fact Sheet for Healthcare Providers: IncredibleEmployment.be  This test is not yet approved or cleared by the [place] FDA and has been authorized for detection and/or diagnosis of SARS-CoV-2 by FDA under an Emergency Use Authorization (EUA). This EUA will remain in effect (meaning this test can be used) for the duration of the COVID-19 declaration under Section 564(b)(1) of the Act, 21 U.S.C. section 360bbb-3(b)(1), unless the authorization is terminated or revoked.  Performed at [facility], [address] [place], [place] [address]   Blood Culture (routine x 2)     Status: Abnormal   Collection Time: 04/21/2020  2:06 AM   Specimen: BLOOD LEFT FOREARM  Result Value Ref Range Status   Specimen Description   Final    BLOOD LEFT FOREARM Performed at [facility], [address] [place], [place] [address]    Special Requests   Final    BOTTLES DRAWN AEROBIC AND ANAEROBIC Blood Culture adequate volume Performed at [facility], [address] [place], [place] [address]    Culture  Setup Time   Final    GRAM POSITIVE COCCI IN BOTH AEROBIC AND ANAEROBIC BOTTLES CRITICAL VALUE NOTED.  VALUE IS CONSISTENT WITH PREVIOUSLY REPORTED AND CALLED VALUE.    Culture (A)  Final    STAPHYLOCOCCUS AUREUS SUSCEPTIBILITIES  PERFORMED ON PREVIOUS CULTURE WITHIN THE LAST 5 DAYS. Performed at [facility], [place] [address], [place], [place] [address]    Report Status 04/24/2020 FINAL  Final  MRSA PCR Screening     Status: None   Collection Time: 04/11/2020  6:24 AM   Specimen: Nasal Mucosa; Nasopharyngeal  Result Value Ref Range Status   MRSA by PCR NEGATIVE NEGATIVE Final    Comment:        The GeneXpert MRSA Assay (FDA approved for NASAL specimens only), is one component of a comprehensive MRSA colonization surveillance program. It is not intended to diagnose MRSA infection nor to guide or monitor treatment for MRSA infections. Performed at [facility], [place] [address], [place], [place] [address]   Culture, blood (single)     Status: Abnormal   Collection Time: 04/18/2020  1:53 PM   Specimen: BLOOD RIGHT HAND  Result Value Ref Range Status   Specimen Description   Final    BLOOD RIGHT HAND Performed at [facility] [address], [place], [place] [address]    Special Requests   Final    BOTTLES DRAWN AEROBIC AND ANAEROBIC Blood Culture results may not be optimal due to an inadequate volume of blood received in culture bottles Performed at [facility] [address] [place], [place] [address]    Culture  Setup Time   Final    ANAEROBIC BOTTLE ONLY GRAM POSITIVE COCCI CRITICAL RESULT CALLED TO, READ BACK BY AND VERIFIED WITH: Concepcion Elk [id number] MLM Performed at [facility], [address], [place], [place] [address]    Culture STAPHYLOCOCCUS AUREUS (A)  Final   Report Status 04/28/2020 FINAL  Final   Organism ID, Bacteria  STAPHYLOCOCCUS AUREUS  Final      Susceptibility   Staphylococcus aureus - MIC*    CIPROFLOXACIN <=0.5 SENSITIVE Sensitive     ERYTHROMYCIN <=0.25 SENSITIVE Sensitive     GENTAMICIN <=0.5 SENSITIVE Sensitive     OXACILLIN 0.5 SENSITIVE Sensitive     TETRACYCLINE <=1 SENSITIVE Sensitive      VANCOMYCIN 1 SENSITIVE Sensitive     TRIMETH/SULFA <=10 SENSITIVE Sensitive     CLINDAMYCIN <=0.25 SENSITIVE Sensitive     RIFAMPIN <=0.5 SENSITIVE Sensitive     Inducible Clindamycin NEGATIVE Sensitive     * STAPHYLOCOCCUS AUREUS  Blood Culture ID Panel (Reflexed)     Status: Abnormal   Collection Time: 04/29/2020  1:53 PM  Result Value Ref Range Status   Enterococcus faecalis NOT DETECTED NOT DETECTED Final   Enterococcus Faecium NOT DETECTED NOT DETECTED Final   Listeria monocytogenes NOT DETECTED NOT DETECTED Final   Staphylococcus species DETECTED (A) NOT DETECTED Final    Comment: CRITICAL RESULT CALLED TO, READ BACK BY AND VERIFIED WITH: PHARMD M HICKS [id number] MLM    Staphylococcus aureus (BCID) DETECTED (A) NOT DETECTED Final    Comment: CRITICAL RESULT CALLED TO, READ BACK BY AND VERIFIED WITH: PHARMD M HICKS [id number] MLM    Staphylococcus epidermidis NOT DETECTED NOT DETECTED Final   Staphylococcus lugdunensis NOT DETECTED NOT DETECTED Final   Streptococcus species NOT DETECTED NOT DETECTED Final   Streptococcus agalactiae NOT DETECTED NOT DETECTED Final   Streptococcus pneumoniae NOT DETECTED NOT DETECTED Final   Streptococcus pyogenes NOT DETECTED NOT DETECTED Final   A.calcoaceticus-baumannii NOT DETECTED NOT DETECTED Final   Bacteroides fragilis NOT DETECTED NOT DETECTED Final   Enterobacterales NOT DETECTED NOT DETECTED Final   Enterobacter cloacae complex NOT DETECTED NOT DETECTED Final   Escherichia coli NOT DETECTED NOT DETECTED Final   Klebsiella aerogenes NOT DETECTED NOT DETECTED Final   Klebsiella oxytoca NOT DETECTED NOT DETECTED Final   Klebsiella pneumoniae NOT DETECTED NOT DETECTED Final   Proteus species NOT DETECTED NOT DETECTED Final   Salmonella species NOT DETECTED NOT DETECTED Final   Serratia marcescens NOT DETECTED NOT DETECTED Final   Haemophilus influenzae NOT DETECTED NOT DETECTED Final   Neisseria meningitidis NOT DETECTED NOT DETECTED  Final   Pseudomonas aeruginosa NOT DETECTED NOT DETECTED Final   Stenotrophomonas maltophilia NOT DETECTED NOT DETECTED Final   Candida albicans NOT DETECTED NOT DETECTED Final   Candida auris NOT DETECTED NOT DETECTED Final   Candida glabrata NOT DETECTED NOT DETECTED Final   Candida krusei NOT DETECTED NOT DETECTED Final   Candida parapsilosis NOT DETECTED NOT DETECTED Final   Candida tropicalis NOT DETECTED NOT DETECTED Final   Cryptococcus neoformans/gattii NOT DETECTED NOT DETECTED Final   Meth resistant mecA/C and MREJ NOT DETECTED NOT DETECTED Final    Comment: Performed at [facility], [address], [place], [place] [address]  Culture, blood (Routine X 2) w Reflex to ID Panel     Status: None   Collection Time: 04/24/20  7:52 AM   Specimen: BLOOD LEFT HAND  Result Value Ref Range Status   Specimen Description   Final    BLOOD LEFT HAND Performed at [facility], [place] [address], [place], [place] [address]    Special Requests   Final    BOTTLES DRAWN AEROBIC AND ANAEROBIC BLOOD LEFT HAND Performed at [facility] [address] [place], [place] [address]    Culture   Final  NO GROWTH 5 DAYS Performed at [facility], [place] [address], [place], [place] [address]    Report Status 04/07/2020 FINAL  Final  Culture, blood (Routine X 2) w Reflex to ID Panel     Status: None   Collection Time: 04/24/20  7:52 AM   Specimen: BLOOD LEFT HAND  Result Value Ref Range Status   Specimen Description   Final    BLOOD LEFT HAND Performed at [facility], [place] [address], [place], [place] [address]    Special Requests   Final    BOTTLES DRAWN AEROBIC AND ANAEROBIC BLOOD LEFT HAND Performed at [facility] [address], [place], [place] [address]    Culture   Final    NO GROWTH 5 DAYS Performed at [facility], [place] [address], [place], [place] [address]    Report Status 04/30/2020 FINAL  Final    Aerobic/Anaerobic Culture (surgical/deep wound)     Status: None   Collection Time: 04/11/2020  2:48 PM   Specimen: PATH Other; Tissue  Result Value Ref Range Status   Specimen Description   Final    ABSCESS SHOULDER RIGHT Performed at [facility] [address], [place], [place] [address]    Special Requests   Final    NONE Performed at [facility], [place] [address], [place], [place] [address]    Gram Stain   Final    FEW WBC PRESENT, PREDOMINANTLY PMN ABUNDANT GRAM POSITIVE COCCI    Culture   Final    MODERATE STAPHYLOCOCCUS AUREUS NO ANAEROBES ISOLATED Performed at [facility], [place] [address], [place], [place] [address]    Report Status 05/01/2020 FINAL  Final   Organism ID, Bacteria STAPHYLOCOCCUS AUREUS  Final      Susceptibility   Staphylococcus aureus - MIC*    CIPROFLOXACIN <=0.5 SENSITIVE Sensitive     ERYTHROMYCIN <=0.25 SENSITIVE Sensitive     GENTAMICIN <=0.5 SENSITIVE Sensitive     OXACILLIN 0.5 SENSITIVE Sensitive     TETRACYCLINE <=1 SENSITIVE Sensitive     VANCOMYCIN 1 SENSITIVE Sensitive     TRIMETH/SULFA <=10 SENSITIVE Sensitive     CLINDAMYCIN <=0.25 SENSITIVE Sensitive     RIFAMPIN <=0.5 SENSITIVE Sensitive     Inducible Clindamycin NEGATIVE Sensitive     * MODERATE STAPHYLOCOCCUS AUREUS  Aerobic/Anaerobic Culture (surgical/deep wound)     Status: None   Collection Time: 04/24/2020  2:58 PM   Specimen: PATH Other; Tissue  Result Value Ref Range Status   Specimen Description   Final    TISSUE SHOULDER RIGHT Performed at [facility] [address], [place], [place] [address]    Special Requests   Final    NONE Performed at [facility], [place] [address] [place], [place] [address]    Gram Stain   Final    ABUNDANT WBC PRESENT, PREDOMINANTLY PMN ABUNDANT GRAM POSITIVE COCCI    Culture   Final    ABUNDANT STAPHYLOCOCCUS AUREUS NO ANAEROBES ISOLATED Performed at  [facility], [place] [address], [place], [place] [address]    Report Status 05/01/2020 FINAL  Final   Organism ID, Bacteria STAPHYLOCOCCUS AUREUS  Final      Susceptibility   Staphylococcus aureus - MIC*    CIPROFLOXACIN <=0.5 SENSITIVE Sensitive     ERYTHROMYCIN <=0.25 SENSITIVE Sensitive     GENTAMICIN <=0.5 SENSITIVE Sensitive     OXACILLIN 0.5 SENSITIVE Sensitive  TETRACYCLINE <=1 SENSITIVE Sensitive     VANCOMYCIN <=0.5 SENSITIVE Sensitive     TRIMETH/SULFA <=10 SENSITIVE Sensitive     CLINDAMYCIN <=0.25 SENSITIVE Sensitive     RIFAMPIN <=0.5 SENSITIVE Sensitive     Inducible Clindamycin NEGATIVE Sensitive     * ABUNDANT STAPHYLOCOCCUS AUREUS  Culture, blood (Routine X 2) w Reflex to ID Panel     Status: None   Collection Time: 04/26/20  9:58 AM   Specimen: BLOOD RIGHT HAND  Result Value Ref Range Status   Specimen Description   Final    BLOOD RIGHT HAND Performed at [facility] [address] [place], [place] [address]    Special Requests   Final    BOTTLES DRAWN AEROBIC ONLY Blood Culture results may not be optimal due to an inadequate volume of blood received in culture bottles Performed at [facility] [address], [place], [place] [address]    Culture   Final    NO GROWTH 5 DAYS Performed at [facility], [place] [address], [place], [place] [address]    Report Status 05/01/2020 FINAL  Final  Culture, blood (Routine X 2) w Reflex to ID Panel     Status: None   Collection Time: 04/26/20  9:58 AM   Specimen: BLOOD RIGHT HAND  Result Value Ref Range Status   Specimen Description   Final    BLOOD RIGHT HAND Performed at [facility] [address], [place], [place] [address]    Special Requests   Final    BOTTLES DRAWN AEROBIC ONLY Blood Culture results may not be optimal due to an inadequate volume of blood received in culture bottles Performed at [facility]  [address], [place], [place] [address]    Culture   Final    NO GROWTH 5 DAYS Performed at [facility], [place] [address], [place], [place] [address]    Report Status 05/01/2020 FINAL  Final  Surgical pcr screen     Status: Abnormal   Collection Time: 04/19/2020  2:39 AM   Specimen: Nasal Mucosa; Nasal Swab  Result Value Ref Range Status   MRSA, PCR NEGATIVE NEGATIVE Final   Staphylococcus aureus POSITIVE (A) NEGATIVE Final    Comment: (NOTE) The Xpert SA Assay (FDA approved for NASAL specimens in patients 94 years of age and older), is one component of a comprehensive surveillance program. It is not intended to diagnose infection nor to guide or monitor treatment. Performed at [facility], [place] [address], [place], [place] [address]   Aerobic/Anaerobic Culture (surgical/deep wound)     Status: None   Collection Time: 04/09/2020 11:01 AM   Specimen: Other Source  Result Value Ref Range Status   Specimen Description TISSUE  Final   Special Requests   Final    MITRAL VALVE VEGETATION SPEC A ANTIBIOTICS ZINACEF VANCOMYCIN   Gram Stain   Final    ABUNDANT WBC PRESENT, PREDOMINANTLY PMN NO ORGANISMS SEEN    Culture   Final    RARE STAPHYLOCOCCUS AUREUS CRITICAL RESULT CALLED TO, READ BACK BY AND VERIFIED WITH: RN Mellissa Kohut [id number] AT 1212 BY CM NO ANAEROBES ISOLATED Performed at [facility], [place] [address] [place], [place] [address]    Report Status 05/05/2020 FINAL  Final   Organism ID, Bacteria STAPHYLOCOCCUS AUREUS  Final      Susceptibility   Staphylococcus aureus - MIC*    CIPROFLOXACIN <=0.5  SENSITIVE Sensitive     ERYTHROMYCIN <=0.25 SENSITIVE Sensitive     GENTAMICIN <=0.5 SENSITIVE Sensitive     OXACILLIN 0.5 SENSITIVE Sensitive     TETRACYCLINE <=1 SENSITIVE Sensitive     VANCOMYCIN 1 SENSITIVE Sensitive     TRIMETH/SULFA <=10 SENSITIVE Sensitive     CLINDAMYCIN <=0.25 SENSITIVE Sensitive     RIFAMPIN <=0.5 SENSITIVE Sensitive     Inducible Clindamycin  NEGATIVE Sensitive     * RARE STAPHYLOCOCCUS AUREUS  Aerobic Culture (superficial specimen)     Status: None (Preliminary result)   Collection Time: 05/07/20  4:39 PM   Specimen: Wound  Result Value Ref Range Status   Specimen Description WOUND RIGHT SHOULDER  Final   Special Requests NONE  Final   Gram Stain   Final    RARE WBC PRESENT, PREDOMINANTLY PMN FEW GRAM POSITIVE COCCI IN PAIRS IN CHAINS    Culture   Final    NO GROWTH < 24 HOURS Performed at [facility], [place] [address], [place], [place] [address]    Report Status PENDING  Incomplete    Pertinent Lab. CBC Latest Ref Rng & Units 05/08/2020 05/07/2020 05/07/2020  WBC 4.0 - 10.5 K/uL 42.7(H) - 37.2(H)  Hemoglobin 13.0 - 17.0 g/dL 8.8(L) 8.8(L) 8.3(L)  Hematocrit 39 - 52 % 29.3(L) 26.0(L) 26.1(L)  Platelets 150 - 400 K/uL 294 - 292   CMP Latest Ref Rng & Units 05/08/2020 05/08/2020 05/07/2020  Glucose 70 - 99 mg/dL PENDING 99 99  BUN 6 - 20 mg/dL PENDING 38(H) 46(H)  Creatinine 0.61 - 1.24 mg/dL PENDING 4.27(H) 4.80(H)  Sodium 135 - 145 mmol/L PENDING 135 139  Potassium 3.5 - 5.1 mmol/L PENDING 6.0(H) 5.5(H)  Chloride 98 - 111 mmol/L PENDING 99 101  CO2 22 - 32 mmol/L PENDING 16(L) 21(L)  Calcium 8.9 - 10.3 mg/dL PENDING 6.6(L) 6.6(L)  Total Protein 6.5 - 8.1 g/dL PENDING 6.8 -  Total Bilirubin 0.3 - 1.2 mg/dL PENDING 7.8(H) -  Alkaline Phos 38 - 126 U/L PENDING 249(H) -  AST 15 - 41 U/L 3,046(H) 767(H) -  ALT 0 - 44 U/L PENDING 238(H) -     Pertinent Imaging today Plain films and CT images have been personally visualized and interpreted; radiology reports have been reviewed. Decision making incorporated into the Impression / Recommendations.  CT Chest abdomen pelvis 05/07/20 FINDINGS: CT CHEST FINDINGS  Cardiovascular: Heart size upper normal. Trace pericardial effusion. Status post mitral valve replacement. Right central line tip is positioned in the mid SVC. Left IJ central line tip is in the mid  to distal SVC. There is abdominal aortic atherosclerosis without aneurysm. Endotracheal tube tip is positioned in the distal trachea. NG tube tip is in the mid stomach.  Mediastinum/Nodes: Scattered small mediastinal lymph nodes evident without lymphadenopathy. Hilar regions obscured by parahilar collapse/consolidative opacity. The esophagus has normal imaging features. Scattered small axillary nodes are evident bilaterally.  Lungs/Pleura: Patchy ground-glass and consolidative opacity is identified bilaterally, centrally predominant in the upper lobes with more confluent collapse/consolidative disease in the lower lungs bilaterally. Small bilateral pleural effusions noted. Pleural drains are evident bilaterally.  Musculoskeletal: No worrisome lytic or sclerotic osseous abnormality. Posterior right first rib fracture noted.  CT ABDOMEN PELVIS FINDINGS  Hepatobiliary: No focal abnormality in the liver on this study without intravenous contrast. There is no evidence for gallstones, gallbladder wall thickening, or pericholecystic fluid. No intrahepatic or extrahepatic biliary dilation.  Pancreas: No focal mass lesion. No dilatation of the  main duct. No intraparenchymal cyst. No peripancreatic edema.  Spleen: Spleen is markedly heterogeneous. Although assessment is limited by lack of intravenous contrast material, imaging features could be compatible with multiple splenic infarcts. Inferior subcapsular hematoma cannot be excluded.  Adrenals/Urinary Tract: No adrenal nodule or mass. Unremarkable noncontrast appearance of the kidneys. No evidence for hydroureter. Bladder decompressed by Foley catheter.  Stomach/Bowel: Stomach is decompressed. Duodenum is normally positioned as is the ligament of Treitz. Fluid-filled small bowel loops measure up to 3.6 cm diameter. Small bowel wall cannot be thoroughly assessed given lack of intravenous contrast. Right colon is moderately  distended and fluid-filled transverse colon is non dependent and diffusely air-filled with mild distention. No wall thickening in the transverse colon. Left colon is decompressed as is the sigmoid colon.  Vascular/Lymphatic: There is abdominal aortic atherosclerosis without aneurysm. Numerous vascular clips are seen in the retroperitoneal space of the abdomen No pelvic sidewall lymphadenopathy.  Reproductive: The prostate gland and seminal vesicles are unremarkable.  Other: Small to moderate volume pelvic ascites.  Musculoskeletal: Diffuse body wall edema noted. No worrisome lytic or sclerotic osseous abnormality.  IMPRESSION: 1. Patchy ground-glass and consolidative opacity bilaterally, centrally predominant in the upper lobes with more confluent collapse/consolidative disease in the lower lungs bilaterally. Imaging features could be related to pulmonary edema or diffuse infection. Small bilateral pleural effusions with bilateral pleural drains in place. 2. Markedly heterogeneous spleen. 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 Inferior subcapsular hematoma cannot be excluded. 3. Fluid-filled minimally distended small bowel loops with mild distention of the right and transverse colon. Imaging features likely related to ileus. 4. Small to moderate volume pelvic ascites. 5. Diffuse body wall edema. 6. Posterior right first rib fracture. 7. Aortic Atherosclerosis (ICD10-I70.0).   I have spent approx 30 minutes for this patient encounter including review of prior medical records with greater than 50% of time being face to face and coordination of their care.

## 2020-05-08 NOTE — Progress Notes (Signed)
Patient ID: Jonathan Shaffer, male   DOB: 02-24-1966, 54 y.o.   MRN: [id number] S: Pt seen and examined while on CRRT.  Hyperkalemia worsened overnight despite CRRT and fluids were changed to 2K/2.5Ca.  Unfortunately he has also developed hypoglycemia and worsening LFT's and hyperbilirubinemia.  He also developed atrial fibrillation and was started on amiodarone last night. O:BP (!) 96/57   Pulse 70   Temp (!) 97.1 F (36.2 C) (Axillary)   Resp (!) 26   Ht _0  (1.905 m)   Wt (!) 139.5 kg   SpO2 98%   BMI 38.44 kg/m   Intake/Output Summary (Last 24 hours) at 05/08/2020 0832 Last data filed at 05/08/2020 0700 Gross per 24 hour  Intake 4872.42 ml  Output 3868 ml  Net 1004.42 ml   Intake/Output: I/O last 3 completed shifts: In: 7755.2 [I.V.:6192.1; NG/GT:60; IV Piggyback:1503.2] Out: 4783 [Urine:82; Emesis/NG output:800; OZDGU:4403; Chest Tube:990]  Intake/Output this shift:  No intake/output data recorded. Weight change: 5.4 kg Gen: intubated and sedated CVS: RRR no rub Resp: ventilated BS bilaterally Abd: distended, firm, hypoactive BS Ext: 2+ anasarca  Recent Labs  Lab 05/01/20 1741 05/02/20 0407 05/03/20 0424 05/03/20 0424 05/04/20 0331 05/04/20 2000 05/05/20 0313 05/05/20 0313 05/06/20 0053 05/06/20 0053 05/06/20 0537 05/06/20 0537 05/06/20 0545 05/06/20 1251 05/06/20 1335 05/07/20 0206 05/07/20 1111 05/07/20 1639 05/08/20 0035  NA 140   < > 135   < > 136   < > 135   < > 137   < > 137   < > 139 139 137 138 138 139 135  K 4.1   < > 4.4   < > 3.8   < > 3.2*   < > 3.2*   < > 3.5   < > 3.5 3.6 3.5 4.7 5.0 5.5* 6.0*  CL 106   < > 98   < > 100   < > 98  --  99  --  99  --   --   --  98 98  --  101 99  CO2 21*   < > 22   < > 23   < > 24  --  24  --  21*  --   --   --  23 20*  --  21* 16*  GLUCOSE 111*   < > 144*   < > 143*   < > 111*  --  107*  --  123*  --   --   --  108* 85  --  99 99  BUN 19   < > 32*   < > 34*   < > 35*  --  33*  --  35*  --   --   --  34* 39*   --  46* 38*  CREATININE 2.02*   < > 2.92*   < > 3.07*   < > 3.13*  --  3.06*  --  3.49*  --   --   --  3.60* 4.27*  --  4.80* 4.27*  ALBUMIN 1.8*  --  1.4*  --  1.7*  --  1.5*  1.5*  --  1.6*  1.6*  --   --   --   --   --   --  2.3*  2.3*  --  2.2* 2.3*  CALCIUM 6.8*   < > 6.9*   < > 6.9*   < > 7.1*  --  7.6*  --  7.3*  --   --   --  7.0* 7.0*  --  6.6* 6.6*  PHOS  --   --   --   --   --   --  7.5*  --  6.7*  --   --   --   --   --   --  10.4*  --  11.9*  --   AST 3,257*  --  1,021*  --  480*  --  198*  --  118*  --   --   --   --   --   --  111*  --   --  767*  ALT 984*  --  686*  --  517*  --  343*  --  270*  --   --   --   --   --   --  140*  --   --  238*   < > = values in this interval not displayed.   Liver Function Tests: Recent Labs  Lab 05/06/20 0053 05/06/20 0053 05/07/20 0206 05/07/20 1639 05/08/20 0035  AST 118*  --  111*  --  767*  ALT 270*  --  140*  --  238*  ALKPHOS 316*  --  199*  --  249*  BILITOT 2.7*  --  5.5*  --  7.8*  PROT 6.9  --  6.2*  --  6.8  ALBUMIN 1.6*  1.6*   < > 2.3*  2.3* 2.2* 2.3*   < > = values in this interval not displayed.   No results for input(s): LIPASE, AMYLASE in the last 168 hours. Recent Labs  Lab 05/03/20 0824  AMMONIA 43*   CBC: Recent Labs  Lab 05/05/20 0313 05/05/20 0313 05/06/20 0053 05/06/20 0053 05/06/20 0537 05/06/20 0545 05/06/20 1335 05/06/20 1335 05/07/20 0206 05/07/20 1111 05/08/20 0035  WBC 23.5*   < > 22.5*   < > 15.5*   < > 14.5*  --  37.2*  --  42.7*  NEUTROABS 20.3*  --  19.8*  --   --   --   --   --  34.3*  --   --   HGB 9.6*   < > 10.5*   < > 9.3*   < > 8.9*   < > 8.3* 8.8* 8.8*  HCT 28.8*   < > 32.1*   < > 28.1*   < > 26.8*   < > 26.1* 26.0* 29.3*  MCV 90.9   < > 90.4  --  93.4  --  92.7  --  93.9  --  100.0  PLT 334   < > 407*   < > 338   < > 322  --  292  --  294   < > = values in this interval not displayed.   Cardiac Enzymes: No results for input(s): CKTOTAL, CKMB, CKMBINDEX, TROPONINI in  the last 168 hours. CBG: Recent Labs  Lab 05/07/20 2328 05/08/20 0010 05/08/20 0203 05/08/20 0405 05/08/20 0408  GLUCAP 67* 79 73 66* 80    Iron Studies: No results for input(s): IRON, TIBC, TRANSFERRIN, FERRITIN in the last 72 hours. Studies/Results: CT ABDOMEN PELVIS WO CONTRAST  Result Date: 05/07/2020 CLINICAL DATA:  Cardiac arrest/asystole. Status post CPR. Bowel obstruction suspected. EXAM: CT CHEST, ABDOMEN AND PELVIS WITHOUT CONTRAST TECHNIQUE: Multidetector CT imaging of the chest, abdomen and pelvis was performed following the standard protocol without IV contrast. COMPARISON:  Abdomen/pelvis CT 04/23/2020 FINDINGS: CT CHEST FINDINGS Cardiovascular: Heart size upper normal. Trace pericardial effusion. Status post  mitral valve replacement. Right central line tip is positioned in the mid SVC. Left IJ central line tip is in the mid to distal SVC. There is abdominal aortic atherosclerosis without aneurysm. Endotracheal tube tip is positioned in the distal trachea. NG tube tip is in the mid stomach. Mediastinum/Nodes: Scattered small mediastinal lymph nodes evident without lymphadenopathy. Hilar regions obscured by parahilar collapse/consolidative opacity. The esophagus has normal imaging features. Scattered small axillary nodes are evident bilaterally. Lungs/Pleura: Patchy ground-glass and consolidative opacity is identified bilaterally, centrally predominant in the upper lobes with more confluent collapse/consolidative disease in the lower lungs bilaterally. Small bilateral pleural effusions noted. Pleural drains are evident bilaterally. Musculoskeletal: No worrisome lytic or sclerotic osseous abnormality. Posterior right first rib fracture noted. CT ABDOMEN PELVIS FINDINGS Hepatobiliary: No focal abnormality in the liver on this study without intravenous contrast. There is no evidence for gallstones, gallbladder wall thickening, or pericholecystic fluid. No intrahepatic or extrahepatic  biliary dilation. Pancreas: No focal mass lesion. No dilatation of the main duct. No intraparenchymal cyst. No peripancreatic edema. Spleen: Spleen is markedly heterogeneous. Although assessment is limited by lack of intravenous contrast material, imaging features could be compatible with multiple splenic infarcts. Inferior subcapsular hematoma cannot be excluded. Adrenals/Urinary Tract: No adrenal nodule or mass. Unremarkable noncontrast appearance of the kidneys. No evidence for hydroureter. Bladder decompressed by Foley catheter. Stomach/Bowel: Stomach is decompressed. Duodenum is normally positioned as is the ligament of Treitz. Fluid-filled small bowel loops measure up to 3.6 cm diameter. Small bowel wall cannot be thoroughly assessed given lack of intravenous contrast. Right colon is moderately distended and fluid-filled transverse colon is non dependent and diffusely air-filled with mild distention. No wall thickening in the transverse colon. Left colon is decompressed as is the sigmoid colon. Vascular/Lymphatic: There is abdominal aortic atherosclerosis without aneurysm. Numerous vascular clips are seen in the retroperitoneal space of the abdomen No pelvic sidewall lymphadenopathy. Reproductive: The prostate gland and seminal vesicles are unremarkable. Other: Small to moderate volume pelvic ascites. Musculoskeletal: Diffuse body wall edema noted. No worrisome lytic or sclerotic osseous abnormality. IMPRESSION: 1. Patchy ground-glass and consolidative opacity bilaterally, centrally predominant in the upper lobes with more confluent collapse/consolidative disease in the lower lungs bilaterally. Imaging features could be related to pulmonary edema or diffuse infection. Small bilateral pleural effusions with bilateral pleural drains in place. 2. Markedly heterogeneous spleen. 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  Inferior subcapsular hematoma cannot be excluded. 3. Fluid-filled minimally distended small bowel loops with mild distention of the right and transverse colon. Imaging features likely related to ileus. 4. Small to moderate volume pelvic ascites. 5. Diffuse body wall edema. 6. Posterior right first rib fracture. 7. Aortic Atherosclerosis (ICD10-I70.0). Electronically Signed   By: Misty Stanley M.D.   On: 05/07/2020 14:16   CT CHEST WO CONTRAST  Result Date: 05/07/2020 CLINICAL DATA:  Cardiac arrest/asystole. Status post CPR. Bowel obstruction suspected. EXAM: CT CHEST, ABDOMEN AND PELVIS WITHOUT CONTRAST TECHNIQUE: Multidetector CT imaging of the chest, abdomen and pelvis was performed following the standard protocol without IV contrast. COMPARISON:  Abdomen/pelvis CT 05/03/2020 FINDINGS: CT CHEST FINDINGS Cardiovascular: Heart size upper normal. Trace pericardial effusion. Status post mitral valve replacement. Right central line tip is positioned in the mid SVC. Left IJ central line tip is in the mid to distal SVC. There is abdominal aortic atherosclerosis without aneurysm. Endotracheal tube tip is positioned in the distal trachea. NG tube tip is in  the mid stomach. Mediastinum/Nodes: Scattered small mediastinal lymph nodes evident without lymphadenopathy. Hilar regions obscured by parahilar collapse/consolidative opacity. The esophagus has normal imaging features. Scattered small axillary nodes are evident bilaterally. Lungs/Pleura: Patchy ground-glass and consolidative opacity is identified bilaterally, centrally predominant in the upper lobes with more confluent collapse/consolidative disease in the lower lungs bilaterally. Small bilateral pleural effusions noted. Pleural drains are evident bilaterally. Musculoskeletal: No worrisome lytic or sclerotic osseous abnormality. Posterior right first rib fracture noted. CT ABDOMEN PELVIS FINDINGS Hepatobiliary: No focal abnormality in the liver on this study without  intravenous contrast. There is no evidence for gallstones, gallbladder wall thickening, or pericholecystic fluid. No intrahepatic or extrahepatic biliary dilation. Pancreas: No focal mass lesion. No dilatation of the main duct. No intraparenchymal cyst. No peripancreatic edema. Spleen: Spleen is markedly heterogeneous. Although assessment is limited by lack of intravenous contrast material, imaging features could be compatible with multiple splenic infarcts. Inferior subcapsular hematoma cannot be excluded. Adrenals/Urinary Tract: No adrenal nodule or mass. Unremarkable noncontrast appearance of the kidneys. No evidence for hydroureter. Bladder decompressed by Foley catheter. Stomach/Bowel: Stomach is decompressed. Duodenum is normally positioned as is the ligament of Treitz. Fluid-filled small bowel loops measure up to 3.6 cm diameter. Small bowel wall cannot be thoroughly assessed given lack of intravenous contrast. Right colon is moderately distended and fluid-filled transverse colon is non dependent and diffusely air-filled with mild distention. No wall thickening in the transverse colon. Left colon is decompressed as is the sigmoid colon. Vascular/Lymphatic: There is abdominal aortic atherosclerosis without aneurysm. Numerous vascular clips are seen in the retroperitoneal space of the abdomen No pelvic sidewall lymphadenopathy. Reproductive: The prostate gland and seminal vesicles are unremarkable. Other: Small to moderate volume pelvic ascites. Musculoskeletal: Diffuse body wall edema noted. No worrisome lytic or sclerotic osseous abnormality. IMPRESSION: 1. Patchy ground-glass and consolidative opacity bilaterally, centrally predominant in the upper lobes with more confluent collapse/consolidative disease in the lower lungs bilaterally. Imaging features could be related to pulmonary edema or diffuse infection. Small bilateral pleural effusions with bilateral pleural drains in place. 2. Markedly heterogeneous  spleen. 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 Inferior subcapsular hematoma cannot be excluded. 3. Fluid-filled minimally distended small bowel loops with mild distention of the right and transverse colon. Imaging features likely related to ileus. 4. Small to moderate volume pelvic ascites. 5. Diffuse body wall edema. 6. Posterior right first rib fracture. 7. Aortic Atherosclerosis (ICD10-I70.0). Electronically Signed   By: Misty Stanley M.D.   On: 05/07/2020 14:16   DG CHEST PORT 1 VIEW  Result Date: 05/07/2020 CLINICAL DATA:  Check central line placement EXAM: PORTABLE CHEST 1 VIEW COMPARISON:  Film from earlier in the same day. FINDINGS: New right jugular temporary dialysis catheter is noted in the mid superior vena cava. No pneumothorax is noted. Endotracheal tube, nasogastric catheter and left jugular central line are again seen and stable. Cardiac shadow is stable. Patchy airspace opacities are noted with right-sided pleural effusion. IMPRESSION: No pneumothorax following right jugular central line placement. Electronically Signed   By: Inez Catalina M.D.   On: 05/07/2020 11:10   DG Chest Port 1 View  Result Date: 05/07/2020 CLINICAL DATA:  ARDS EXAM: PORTABLE CHEST 1 VIEW COMPARISON:  05/06/2020 FINDINGS: Endotracheal tube, gastric catheter and left jugular central line are again seen and stable. Old pacer wires are again identified. Cardiomegaly is stable. Bilateral airspace opacity is again identified similar to that seen on  the prior exam. Bilateral thoracostomy catheters are noted without evidence of pneumothorax. No bony abnormality is seen. IMPRESSION: Tubes and lines as described above. Persistent bilateral airspace opacities stable from the prior study. No pneumothorax is noted. Electronically Signed   By: Inez Catalina M.D.   On: 05/07/2020 08:51   DG CHEST PORT 1 VIEW  Result Date:  05/06/2020 CLINICAL DATA:  Central placement. EXAM: PORTABLE CHEST 1 VIEW COMPARISON:  One-view chest x-ray 05/06/2020 FINDINGS: Heart is enlarged. Endotracheal tube is stable, 4 cm above the carina. Pacing wires are stable. Enteric tube courses off the inferior border the film. A new left IJ line is in place. The tip is at the cavoatrial junction. Interstitial edema has increased slightly. Bilateral pleural effusions are noted. Bilateral chest tubes are in place. No pneumothorax is present. IMPRESSION: 1. Interval placement of left IJ line with the tip at the cavoatrial junction. 2. Slight increase in interstitial edema. 3. Stable bilateral pleural effusions. 4. Stable support apparatus otherwise. Electronically Signed   By: San Morelle M.D.   On: 05/06/2020 09:23   ECHOCARDIOGRAM LIMITED  Result Date: 05/06/2020    ECHOCARDIOGRAM LIMITED REPORT   Patient Name:   MUHAMMED TEUTSCH Date of Exam: 05/06/2020 Medical Rec #:  [id number]      Height:       75.0 in Accession #:    [id number]     Weight:       298.5 lb Date of Birth:  [birth year]     BSA:          2.602 m Patient Age:    18 years       BP:           103/53 mmHg Patient Gender: M              HR:           97 bpm. Exam Location:  Inpatient Procedure: Limited Echo Indications:    Pericardial effusion  History:        Patient has prior history of Echocardiogram examinations, most                 recent 05/02/2020. Risk Factors:Diabetes. S/P mitral valve                 repair. H/O endiocarditis of mitral valve. Bacteremia.  Sonographer:    Clayton Lefort RDCS (AE) Referring Phys: [id number] Candee Furbish  Sonographer Comments: Technically difficult study due to poor echo windows, suboptimal parasternal window, suboptimal apical window, patient is morbidly obese and echo performed with patient supine and on artificial respirator. Image acquisition challenging due to patient body habitus. IMPRESSIONS  1. The echo is technically difficult with poor image  quality. I was initially concerned with possible akinesis of the inferobasal segments but with Definity contrast, the LV is seen to contract normally . Marland Kitchen Left ventricular ejection fraction, by estimation, is 60 to 65%. The left ventricle has normal function. The left ventricle demonstrates regional wall motion abnormalities (see scoring diagram/findings for description). There is severe akinesis of the left ventricular, basal inferior wall.  2. The aortic valve is grossly normal. Aortic valve regurgitation is not visualized. No aortic stenosis is present. FINDINGS  Left Ventricle: The echo is technically difficult with poor image quality. I was initially concerned with possible akinesis of the inferobasal segments but with Definity contrast, the LV is seen to contract normally. Left ventricular ejection fraction, by estimation, is 60 to 65%. The left ventricle  has normal function. The left ventricle demonstrates regional wall motion abnormalities. Severe akinesis of the left ventricular, basal inferior wall. Definity contrast agent was given IV to delineate the left ventricular endocardial borders. The left ventricular internal cavity size was normal in size. Aortic Valve: The aortic valve is grossly normal. Aortic valve regurgitation is not visualized. No aortic stenosis is present. Mertie Moores MD Electronically signed by Mertie Moores MD Signature Date/Time: 05/06/2020/10:46:09 AM    Final    . acetaminophen (TYLENOL) oral liquid 160 mg/5 mL  500 mg Per Tube Q8H  . aspirin  150 mg Rectal Daily  . bisacodyl  10 mg Oral Daily   Or  . bisacodyl  10 mg Rectal Daily  . bisacodyl  10 mg Rectal BID  . chlorhexidine gluconate (MEDLINE KIT)  15 mL Mouth Rinse BID  . Chlorhexidine Gluconate Cloth  6 each Topical Daily  . hydrocortisone sod succinate (SOLU-CORTEF) inj  50 mg Intravenous Q6H  . insulin aspart  0-24 Units Subcutaneous Q4H  . lidocaine  1 patch Transdermal Daily  . mouth rinse  15 mL Mouth Rinse  10 times per day  . metoCLOPramide (REGLAN) injection  10 mg Intravenous Q6H  . neostigmine  0.25 mg Intramuscular Q6H  . pantoprazole (PROTONIX) IV  40 mg Intravenous Q24H  . sodium zirconium cyclosilicate  10 g Oral TID  . Warfarin - Pharmacist Dosing Inpatient   Does not apply q1600    BMET    Component Value Date/Time   NA 135 05/08/2020 0035   K 6.0 (H) 05/08/2020 0035   CL 99 05/08/2020 0035   CO2 16 (L) 05/08/2020 0035   GLUCOSE 99 05/08/2020 0035   BUN 38 (H) 05/08/2020 0035   CREATININE 4.27 (H) 05/08/2020 0035   CALCIUM 6.6 (L) 05/08/2020 0035   GFRNONAA 16 (L) 05/08/2020 0035   CBC    Component Value Date/Time   WBC 42.7 (H) 05/08/2020 0035   RBC 2.93 (L) 05/08/2020 0035   HGB 8.8 (L) 05/08/2020 0035   HCT 29.3 (L) 05/08/2020 0035   PLT 294 05/08/2020 0035   MCV 100.0 05/08/2020 0035   MCH 30.0 05/08/2020 0035   MCHC 30.0 05/08/2020 0035   RDW 19.1 (H) 05/08/2020 0035   LYMPHSABS 1.6 05/07/2020 0206   MONOABS 0.4 05/07/2020 0206   EOSABS 0.0 05/07/2020 0206   BASOSABS 0.1 05/07/2020 0206     Assessment/Plan: 1. AKI, non-oliguric- Unclear etiology, however he has had multiple renal insults with hypotension following surgery requiring pressors, multiple antibiotics, IV contrast, and diuretics with marked diuresis since his surgery. UA with moderate blood, trace leukocytes, negative protein. DDx includes ischemic ATN from hypotension/pressors, AIN from Nafcillin, immune mediated GN due to endocarditis, septic emboli to kidneys, or pre-renal azotemia due to diuresis and 3rd spacing. Now with superimposed ischemic ATN in setting of respiratory arrest and shock from aspiration pna requiring pressors. 1. + urineeosinophils, however no eosinophilia on cbc with diff. Nonspecific and could be due to AIN or atheroemboli to kidneys (CT scan performed 05/07/20 without changes to kidneys). Discussed with ID and once he has been treated appropriately for CNS infection,  Nafcillin stopped today and abx switched per ID initially to cefepime, flagyl, and vancomycin.  2. Serologies negative dsDNA, ASO, GBM, ANA, normal CHF but + ANCA PR3 at 23. C3 low but normal C4 consistent with alternative pathway activation.  Cannot perform renal biopsy due to need of anticoagulation and unstable clinical condition. 3. Renal artery duplex without stenosis  4. Renal dose meds for eGFR is <20 with rising BUN/Cr. Avoid nephrotoxic agents (NSAIDs/IV contrast, Fleets enema). 5. Remains oliguric/anuric.  Initiated CRRT 05/07/20. 6. CRRT Orders:  All fluid 2K/2.5Ca, pre-filter 500 ml/hr, post-filter 300 ml/hr, dialysate 1500 ml/hr. UF goal to keep even, no heparin as on anticoagulation 7. Right IJ HD catheter placed 05/07/20. 8. Repeat renal panel this morning and if K still elevated will increase rate of dialysate to 2000 ml/hr.   2. Hyperkalemia- despite initiating CRRT.  Fluids changed to 2K/2.5 Ca.  Will recheck renal panel this morning.  Will also stop plasmalyte IVF's as it has potassium.  Overall a poor prognosis given ongoing sepsis and worsening liver function. 3. VDRF following Acute respiratory arrest with aspiration - early this morning now with shock and on pressors per PCCM 4. 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 ID following and will require 6 weeks of IV abx. 5. S/p MVR 6. Sinus node dysfunction- has known atrial rhythm and may require PPM 7. DM type 2 8. Anemia of critical illness/ABLA following surgery- transfuse as needed 9. Acute CHF following sinus arrest 10. Rash- on torso, possible drug rash from Nafcillin.ID following and now on cefepime and vanco.  Last dose of Nafcillin 05/08/20.   Donetta Potts, MD [employer] [phone]

## 2020-05-09 ENCOUNTER — Inpatient Hospital Stay (HOSPITAL_COMMUNITY): Payer: BC Managed Care – PPO

## 2020-05-09 DIAGNOSIS — R6521 Severe sepsis with septic shock: Secondary | ICD-10-CM | POA: Diagnosis not present

## 2020-05-09 DIAGNOSIS — E1110 Type 2 diabetes mellitus with ketoacidosis without coma: Secondary | ICD-10-CM | POA: Diagnosis present

## 2020-05-09 DIAGNOSIS — A4101 Sepsis due to Methicillin susceptible Staphylococcus aureus: Secondary | ICD-10-CM | POA: Diagnosis present

## 2020-05-09 DIAGNOSIS — M60011 Infective myositis, right shoulder: Secondary | ICD-10-CM | POA: Diagnosis not present

## 2020-05-09 DIAGNOSIS — E872 Acidosis: Secondary | ICD-10-CM | POA: Diagnosis not present

## 2020-05-09 DIAGNOSIS — J80 Acute respiratory distress syndrome: Secondary | ICD-10-CM | POA: Diagnosis not present

## 2020-05-09 DIAGNOSIS — J9811 Atelectasis: Secondary | ICD-10-CM | POA: Diagnosis not present

## 2020-05-09 DIAGNOSIS — Z20822 Contact with and (suspected) exposure to covid-19: Secondary | ICD-10-CM | POA: Diagnosis present

## 2020-05-09 DIAGNOSIS — N170 Acute kidney failure with tubular necrosis: Secondary | ICD-10-CM | POA: Diagnosis present

## 2020-05-09 DIAGNOSIS — M009 Pyogenic arthritis, unspecified: Secondary | ICD-10-CM | POA: Diagnosis present

## 2020-05-09 DIAGNOSIS — K7200 Acute and subacute hepatic failure without coma: Secondary | ICD-10-CM | POA: Diagnosis present

## 2020-05-09 DIAGNOSIS — I76 Septic arterial embolism: Secondary | ICD-10-CM | POA: Diagnosis not present

## 2020-05-09 DIAGNOSIS — Y92239 Unspecified place in hospital as the place of occurrence of the external cause: Secondary | ICD-10-CM | POA: Diagnosis not present

## 2020-05-09 DIAGNOSIS — E440 Moderate protein-calorie malnutrition: Secondary | ICD-10-CM | POA: Diagnosis not present

## 2020-05-09 DIAGNOSIS — F05 Delirium due to known physiological condition: Secondary | ICD-10-CM | POA: Diagnosis not present

## 2020-05-09 DIAGNOSIS — J690 Pneumonitis due to inhalation of food and vomit: Secondary | ICD-10-CM | POA: Diagnosis not present

## 2020-05-09 DIAGNOSIS — I330 Acute and subacute infective endocarditis: Secondary | ICD-10-CM | POA: Diagnosis present

## 2020-05-09 DIAGNOSIS — E871 Hypo-osmolality and hyponatremia: Secondary | ICD-10-CM | POA: Diagnosis not present

## 2020-05-09 DIAGNOSIS — G9341 Metabolic encephalopathy: Secondary | ICD-10-CM | POA: Diagnosis present

## 2020-05-09 DIAGNOSIS — L03114 Cellulitis of left upper limb: Secondary | ICD-10-CM | POA: Diagnosis not present

## 2020-05-09 DIAGNOSIS — I5021 Acute systolic (congestive) heart failure: Secondary | ICD-10-CM | POA: Diagnosis not present

## 2020-05-09 DIAGNOSIS — Z66 Do not resuscitate: Secondary | ICD-10-CM | POA: Diagnosis not present

## 2020-05-09 DIAGNOSIS — D689 Coagulation defect, unspecified: Secondary | ICD-10-CM | POA: Diagnosis not present

## 2020-05-09 DIAGNOSIS — J95821 Acute postprocedural respiratory failure: Secondary | ICD-10-CM | POA: Diagnosis not present

## 2020-05-09 DIAGNOSIS — E860 Dehydration: Secondary | ICD-10-CM | POA: Diagnosis present

## 2020-05-09 DIAGNOSIS — L02413 Cutaneous abscess of right upper limb: Secondary | ICD-10-CM | POA: Diagnosis not present

## 2020-05-09 LAB — POCT I-STAT 7, (LYTES, BLD GAS, ICA,H+H)
Acid-base deficit: 8 mmol/L — ABNORMAL HIGH (ref 0.0–2.0)
Bicarbonate: 18.3 mmol/L — ABNORMAL LOW (ref 20.0–28.0)
Calcium, Ion: 0.84 mmol/L — CL (ref 1.15–1.40)
HCT: 32 % — ABNORMAL LOW (ref 39.0–52.0)
Hemoglobin: 10.9 g/dL — ABNORMAL LOW (ref 13.0–17.0)
O2 Saturation: 99 %
Patient temperature: 33.4
Potassium: 6.2 mmol/L — ABNORMAL HIGH (ref 3.5–5.1)
Sodium: 134 mmol/L — ABNORMAL LOW (ref 135–145)
TCO2: 19 mmol/L — ABNORMAL LOW (ref 22–32)
pCO2 arterial: 33.1 mmHg (ref 32.0–48.0)
pH, Arterial: 7.332 — ABNORMAL LOW (ref 7.350–7.450)
pO2, Arterial: 136 mmHg — ABNORMAL HIGH (ref 83.0–108.0)

## 2020-05-09 LAB — COMPREHENSIVE METABOLIC PANEL
ALT: 688 U/L — ABNORMAL HIGH (ref 0–44)
AST: 3457 U/L — ABNORMAL HIGH (ref 15–41)
Albumin: 2 g/dL — ABNORMAL LOW (ref 3.5–5.0)
Alkaline Phosphatase: 288 U/L — ABNORMAL HIGH (ref 38–126)
Anion gap: 20 — ABNORMAL HIGH (ref 5–15)
BUN: 30 mg/dL — ABNORMAL HIGH (ref 6–20)
CO2: 15 mmol/L — ABNORMAL LOW (ref 22–32)
Calcium: 6.7 mg/dL — ABNORMAL LOW (ref 8.9–10.3)
Chloride: 98 mmol/L (ref 98–111)
Creatinine, Ser: 2.81 mg/dL — ABNORMAL HIGH (ref 0.61–1.24)
GFR, Estimated: 26 mL/min — ABNORMAL LOW (ref >60)
Glucose, Bld: 155 mg/dL — ABNORMAL HIGH (ref 70–99)
Potassium: 5.9 mmol/L — ABNORMAL HIGH (ref 3.5–5.1)
Sodium: 133 mmol/L — ABNORMAL LOW (ref 135–145)
Total Bilirubin: 8.6 mg/dL — ABNORMAL HIGH (ref 0.3–1.2)
Total Protein: 6.4 g/dL — ABNORMAL LOW (ref 6.5–8.1)

## 2020-05-09 LAB — CBC
HCT: 28.7 % — ABNORMAL LOW (ref 39.0–52.0)
Hemoglobin: 9.2 g/dL — ABNORMAL LOW (ref 13.0–17.0)
MCH: 31.4 pg (ref 26.0–34.0)
MCHC: 32.1 g/dL (ref 30.0–36.0)
MCV: 98 fL (ref 80.0–100.0)
Platelets: 206 10*3/uL (ref 150–400)
RBC: 2.93 MIL/uL — ABNORMAL LOW (ref 4.22–5.81)
RDW: 20.2 % — ABNORMAL HIGH (ref 11.5–15.5)
WBC: 40.4 10*3/uL — ABNORMAL HIGH (ref 4.0–10.5)
nRBC: 0.6 % — ABNORMAL HIGH (ref 0.0–0.2)

## 2020-05-09 LAB — PROTIME-INR
INR: 5.2 (ref 0.8–1.2)
Prothrombin Time: 46 seconds — ABNORMAL HIGH (ref 11.4–15.2)

## 2020-05-09 LAB — GLUCOSE, CAPILLARY
Glucose-Capillary: 59 mg/dL — ABNORMAL LOW (ref 70–99)
Glucose-Capillary: 155 mg/dL — ABNORMAL HIGH (ref 70–99)
Glucose-Capillary: 150 mg/dL — ABNORMAL HIGH (ref 70–99)

## 2020-05-09 LAB — LACTIC ACID, PLASMA: Lactic Acid, Venous: 9.1 mmol/L (ref 0.5–1.9)

## 2020-05-09 LAB — MAGNESIUM: Magnesium: 2.8 mg/dL — ABNORMAL HIGH (ref 1.7–2.4)

## 2020-05-09 MED ORDER — SODIUM BICARBONATE 8.4 % IV SOLN
50.0000 meq | Freq: Once | INTRAVENOUS | Status: AC
  Administered 2020-05-09: 50 meq via INTRAVENOUS
  Filled 2020-05-09: qty 50

## 2020-05-09 MED ORDER — GLYCOPYRROLATE 0.2 MG/ML IJ SOLN
0.2000 mg | INTRAMUSCULAR | Status: DC | PRN
  Filled 2020-05-09: qty 1

## 2020-05-09 MED ORDER — DEXTROSE 5 % IV SOLN: INTRAVENOUS | Status: DC

## 2020-05-09 MED ORDER — CALCIUM GLUCONATE 10 % IV SOLN: 1.0000 g | Freq: Once | INTRAVENOUS | Status: DC

## 2020-05-09 MED ORDER — CALCIUM GLUCONATE-NACL 1-0.675 GM/50ML-% IV SOLN
1.0000 g | Freq: Once | INTRAVENOUS | Status: AC
  Administered 2020-05-09: 1000 mg via INTRAVENOUS
  Filled 2020-05-09: qty 50

## 2020-05-09 MED ORDER — DEXTROSE 10 % IV SOLN: Freq: Once | INTRAVENOUS | Status: AC

## 2020-05-09 MED ORDER — GLYCOPYRROLATE 0.2 MG/ML IJ SOLN: 0.2000 mg | INTRAMUSCULAR | Status: DC | PRN

## 2020-05-09 MED ORDER — GLYCOPYRROLATE 1 MG PO TABS
1.0000 mg | ORAL_TABLET | ORAL | Status: DC | PRN
  Administered 2020-05-09: 1 mg via ORAL
  Filled 2020-05-09 (×2): qty 1

## 2020-05-09 MED ORDER — PRISMASOL BGK 0/2.5 32-2.5 MEQ/L REPLACEMENT SOLN
Status: DC
  Filled 2020-05-09 (×2): qty 5000

## 2020-05-09 MED ORDER — PRISMASOL BGK 0/2.5 32-2.5 MEQ/L REPLACEMENT SOLN
Status: DC
  Filled 2020-05-09: qty 5000

## 2020-05-09 MED ORDER — DIPHENHYDRAMINE HCL 50 MG/ML IJ SOLN: 25.0000 mg | INTRAMUSCULAR | Status: DC | PRN

## 2020-05-09 MED ORDER — ACETAMINOPHEN 325 MG PO TABS: 650.0000 mg | ORAL_TABLET | Freq: Four times a day (QID) | ORAL | Status: DC | PRN

## 2020-05-09 MED ORDER — SODIUM BICARBONATE 8.4 % IV SOLN
Freq: Once | INTRAVENOUS | Status: AC
  Filled 2020-05-09: qty 100

## 2020-05-09 MED ORDER — PRISMASOL BGK 0/2.5 32-2.5 MEQ/L EC SOLN
Status: DC
  Filled 2020-05-09 (×5): qty 5000

## 2020-05-09 MED ORDER — INSULIN ASPART 100 UNIT/ML IV SOLN
5.0000 [IU] | Freq: Once | INTRAVENOUS | Status: AC
  Administered 2020-05-09: 5 [IU] via INTRAVENOUS

## 2020-05-09 MED ORDER — POLYVINYL ALCOHOL 1.4 % OP SOLN
1.0000 [drp] | Freq: Four times a day (QID) | OPHTHALMIC | Status: DC | PRN
  Filled 2020-05-09: qty 15

## 2020-05-09 MED ORDER — DEXTROSE 50 % IV SOLN
1.0000 | Freq: Once | INTRAVENOUS | Status: AC
  Administered 2020-05-09: 50 mL via INTRAVENOUS
  Filled 2020-05-09: qty 50

## 2020-05-09 MED ORDER — ACETAMINOPHEN 650 MG RE SUPP: 650.0000 mg | Freq: Four times a day (QID) | RECTAL | Status: DC | PRN

## 2020-05-09 MED ORDER — LORAZEPAM 2 MG/ML IJ SOLN: 2.0000 mg | INTRAMUSCULAR | Status: DC | PRN

## 2020-05-10 LAB — AEROBIC CULTURE W GRAM STAIN (SUPERFICIAL SPECIMEN): Culture: NO GROWTH

## 2020-05-13 LAB — CULTURE, BLOOD (ROUTINE X 2): Culture: NO GROWTH

## 2020-06-03 LAB — ECHO INTRAOPERATIVE TEE
Height: 75 in
LVOT diameter: 21 mm
STJ: 2.5 cm
Sinus: 3 cm
Weight: 4659.64 oz

## 2020-06-04 NOTE — Progress Notes (Signed)
NAME:  Jonathan Shaffer, MRN:  [id number], DOB:  1966-03-29, LOS: 33 ADMISSION DATE:  04/30/2020, CONSULTATION DATE:  04/23/20 REFERRING MD:  Jonathan Shaffer, CHIEF COMPLAINT:  Septic shock  Brief History   MSSA bacteremia +/- AC septic arthritis, septic emboli to spleen  History of present illness   Jonathan Shaffer is a 55 y/o gentleman admitted with sepsis, found to have MSSA bacteremia. He developed sudden onset nausea and vomiting and chills last week and later developed fevers and R shoulder pain. He was evaluated and felt to have a sprain. He was treated with an OP steroid injection and started oral steroids. He developed a painful rash on his hand. His progressive malaise, fevers, rash, and joint pain prompted return to the ED. At presentation he was febrile, tachycardic, tachypneic, with lactic acidosis. He was in DKA at presentation. He has a previous history of "high diabetes markers", which he reports he was not following up on. He was started on empiric vanc, zosyn & cefepime, and doxycycline. He has been evaluated by orthopaedics for possible septic arthritis of his R AC joint. ID has been consulted for MSSA+ blood cultures. No known history of IVDU or injuries prior to becoming ill. He has been encephalopathic this admission, prompting concern for septic emboli to the brain; MRI pending.  Past Medical History  Uncontrolled DM Testicular cancer; in remission [facility] Events   Started vasopressors 11/20 MV replacement 11/27 12/2 aspiration event  Consults:  ID Ortho PCCM [facility]CTS Nephrology  Procedures:  PICC  Mechanical MV replacement 11/27  Significant Diagnostic Tests:  MRI R shoulder> septic arthritis AC joint, possible intramuscular abscess, OM distal clavicle Echocardiogram 11/19> LVEF 55 to 60%, normal valves. LUE US> no DVT MRI left wrist> cellulitis, no septic arthritis  Micro Data:  Blood culture 11/19> MSSA  2/3 Urine culture 11/19> staph aureus Blood  culture 11/21> NG Shoulder culture 11/22> abundant MSSA Blood cultuers 11/23> NG Operative cultures 11/22> abundant WBC> rare MSSA  Antimicrobials:  Vanc 11/20 Zosyn 11/19 Cefepime 11/19- 11/20 Doxycycline 11/19 Nafcillin 11/20>  Interim history/subjective:  Remains in multiorgan failure.  Made DNR overnight.  Objective   Blood pressure 98/63, pulse 67, temperature (!) 95.2 F (35.1 C), resp. rate (!) 26, height 6\' 3"  (1.905 m), weight (!) 139.5 kg, SpO2 99 %. CVP:  [12 mmHg-19 mmHg] 15 mmHg  Vent Mode: PCV FiO2 (%):  [60 %-90 %] 60 % Set Rate:  [26 bmp] 26 bmp Vt Set:  [992 mL] 670 mL PEEP:  [12 cmH20] 12 cmH20 Plateau Pressure:  [32 cmH20-34 cmH20] 33 cmH20   Intake/Output Summary (Last 24 hours) at 2020/06/01 0656 Last data filed at 01-Jun-2020 0600 Gross per 24 hour  Intake 2589.67 ml  Output 2604 ml  Net -14.33 ml   Filed Weights   05/06/20 0600 05/07/20 0500 05/08/20 0500  Weight: 135.4 kg 134.1 kg (!) 139.5 kg    Examination: Constitutional: ill appearing unresponsive man on vent  Eyes: upward gaze deviation, pupils sluggish Ears, nose, mouth, and throat: Trachea midline, ETT in place, with minimal secretions Cardiovascular: RRR, ext lukewarm Respiratory: Diminished at bases, no accessory muscle use, not triggering vent Gastrointestinal: distended, absent BS Skin: No rashes, normal turgor Neurologic: GCS3 Psychiatric: cannot assess  Stable leukocytosis Stable lactic acidemiea Worsening pressor requirements Worsening hypoglycemia Worsening coagulopathy    WBC up again CBGs now low LFTs up K up despite HD Lactate pending  Resolved Hospital Problem list   Lactic acidosis Septic shock Hyponatremia  Assessment & Plan:   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  Acute respiratory failure post-operatively  Aspiration event leading to profound  hypoxemia 12/4, ARDS In hospital PEA arrest, severe septic/distributive shock- suspicion for aspiration given enlarging abdomen, N/V preceding. Improved Encephalopathy- post arrest currently heavily sedated due to O2 needs Acute renal failure, refractory hyperkalemia Acute liver failure, refractory hypoglycemia, worsening Afib w/ SVR- now paced Ileus Overal distributive shock with worsening multiorgan failure- terminal decline over past 48 hours.  - Meet today to discuss allowing Jonathan Shaffer to pass in peace - Continue vent support in inteirm   Patient critically ill due to severe shock, severe hypoxemic respiratory failure, liver failure Interventions to address this today vent support, pressors, antibiotics, family meeting Risk of deterioration without these interventions is high  I personally spent 32 minutes providing critical care not including any separately billable procedures  Erskine Emery MD [facility] 2020/06/04 6:56 AM Personal pager: [phone] If unanswered, please page CCM On-call: [phone]

## 2020-06-04 NOTE — Progress Notes (Signed)
Wife at bedside, indicates that she wants no chest compressions if patient's heart were to stop. States that she would like to continue life supporting care until her children can come and be at the bedside. MD to be notified by [facility] nurse

## 2020-06-04 NOTE — Procedures (Signed)
Extubation Procedure Note  Patient Details:   Name: Jonathan Shaffer DOB: March 03, 1966 MRN: [id number]   Airway Documentation:    Vent end date: 05/23/2020 Vent end time: 0748   Evaluation  O2 sats: terminal extubation  Complications: No apparent complications Patient terminal extubation tolerate procedure well. Bilateral Breath Sounds: Diminished, Rhonchi   No   Pt was terminally extubated per MD order.  Ronaldo Miyamoto 05-23-20, 7:48 AM

## 2020-06-04 NOTE — Progress Notes (Signed)
CRITICAL VALUE STICKER  CRITICAL VALUE: INR 5.2  RECEIVER Star Age, RN  DATE & TIME NOTIFIED: 06-Jun-2020  MD NOTIFIED: Dr. Genevive Bi  TIME OF NOTIFICATION: 06-06-20 at [place]

## 2020-06-04 NOTE — Progress Notes (Signed)
[facility] Progress Note Patient Name: Jonathan Shaffer DOB: 11-17-1965 MRN: [id number]   Date of Service  2020-05-28  HPI/Events of Note  Notified of INR 5.9 and repeat K 5.9 from 6.2 No report of bleeding  eICU Interventions  Inform nephrology and pharmacy of results as may need to be heparin free     Intervention Category Major Interventions: Other:  [name] 05-28-2020, 4:33 AM

## 2020-06-04 NOTE — Death Summary Note (Signed)
DEATH SUMMARY   Patient Details  Name: Jonathan Shaffer MRN: [id number] DOB: 1966-03-17  Admission/Discharge Information   Admit Date:  May 14, 2020  Date of Death: Date of Death: 05-31-20  Time of Death: Time of Death: 0810  Length of Stay: 09-10-2022  Referring Physician: Patient, No Pcp Per   Reason(s) for Hospitalization  MSSA endocarditis S/P mitral valve replacement In hospital cardiac arrest suspected from aspiration event  Diagnoses  Preliminary cause of death: aspiration pneumonia Secondary Diagnoses (including complications and co-morbidities):  Principal Problem:   Abscess of right shoulder Active Problems:   Severe sepsis ([place])   Splenic infarct   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 year old male who presented with profound sepsis found to have MSSA bacteremia with seeding of mitral valve.  He underwent a mitral valve replacement and was recovering well postoperatively until he had an aspiration event in the morning of 05/07/20. This lead to an in-hospital cardiac arrest.  Unfortunately after this event he slowly went into multiorgan failure despite aggressive care.  Given dire prognosis, family decided to allow him to pass in peace.  Pertinent Labs and Studies  Significant Diagnostic Studies CT ABDOMEN PELVIS WO CONTRAST  Result Date: 05/07/2020 CLINICAL DATA:  Cardiac arrest/asystole. Status post CPR. Bowel obstruction suspected. EXAM: CT CHEST, ABDOMEN AND PELVIS WITHOUT CONTRAST TECHNIQUE: Multidetector CT imaging of the chest, abdomen and pelvis was performed following the standard  protocol without IV contrast. COMPARISON:  Abdomen/pelvis CT May 14, 2020 FINDINGS: CT CHEST FINDINGS Cardiovascular: Heart size upper normal. Trace pericardial effusion. Status post mitral valve replacement. Right central line tip is positioned in the mid SVC. Left IJ central line tip is in the mid to distal SVC. There is abdominal aortic atherosclerosis without aneurysm. Endotracheal tube tip is positioned in the distal trachea. NG tube tip is in the mid stomach. Mediastinum/Nodes: Scattered small mediastinal lymph nodes evident without lymphadenopathy. Hilar regions obscured by parahilar collapse/consolidative opacity. The esophagus has normal imaging features. Scattered small axillary nodes are evident bilaterally. Lungs/Pleura: Patchy ground-glass and consolidative opacity is identified bilaterally, centrally predominant in the upper lobes with more confluent collapse/consolidative disease in the lower lungs bilaterally. Small bilateral pleural effusions noted. Pleural drains are evident bilaterally. Musculoskeletal: No worrisome lytic or sclerotic osseous abnormality. Posterior right first rib fracture noted. CT ABDOMEN PELVIS FINDINGS Hepatobiliary: No focal abnormality in the liver on this study without intravenous contrast. There is no evidence for gallstones, gallbladder wall thickening, or pericholecystic fluid. No intrahepatic or extrahepatic biliary dilation. Pancreas: No focal mass lesion. No dilatation of the main duct. No intraparenchymal cyst. No peripancreatic edema. Spleen: Spleen is markedly heterogeneous. Although assessment is limited by lack of intravenous contrast material, imaging features could be compatible with multiple splenic infarcts. Inferior subcapsular hematoma cannot be excluded. Adrenals/Urinary Tract: No adrenal nodule or mass. Unremarkable noncontrast appearance of the kidneys. No evidence for hydroureter. Bladder decompressed by Foley catheter. Stomach/Bowel: Stomach is  decompressed. Duodenum is normally positioned as is the ligament of Treitz. Fluid-filled small bowel loops measure up to 3.6 cm diameter. Small bowel wall cannot be thoroughly  assessed given lack of intravenous contrast. Right colon is moderately distended and fluid-filled transverse colon is non dependent and diffusely air-filled with mild distention. No wall thickening in the transverse colon. Left colon is decompressed as is the sigmoid colon. Vascular/Lymphatic: There is abdominal aortic atherosclerosis without aneurysm. Numerous vascular clips are seen in the retroperitoneal space of the abdomen No pelvic sidewall lymphadenopathy. Reproductive: The prostate gland and seminal vesicles are unremarkable. Other: Small to moderate volume pelvic ascites. Musculoskeletal: Diffuse body wall edema noted. No worrisome lytic or sclerotic osseous abnormality. IMPRESSION: 1. Patchy ground-glass and consolidative opacity bilaterally, centrally predominant in the upper lobes with more confluent collapse/consolidative disease in the lower lungs bilaterally. Imaging features could be related to pulmonary edema or diffuse infection. Small bilateral pleural effusions with bilateral pleural drains in place. 2. Markedly heterogeneous spleen. 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 Inferior subcapsular hematoma cannot be excluded. 3. Fluid-filled minimally distended small bowel loops with mild distention of the right and transverse colon. Imaging features likely related to ileus. 4. Small to moderate volume pelvic ascites. 5. Diffuse body wall edema. 6. Posterior right first rib fracture. 7. Aortic Atherosclerosis (ICD10-I70.0). Electronically Signed   By: Misty Stanley M.D.   On: 05/07/2020 14:16   DG Chest 1 View  Result Date: 05/04/2020 CLINICAL DATA:  Post MVR, chest tube EXAM: CHEST  1 VIEW COMPARISON:  Portable exam 0553 hours  compared to 05/03/2020 FINDINGS: LEFT jugular central venous catheter unchanged. BILATERAL thoracostomy tubes unchanged. Enlargement of cardiac silhouette post median sternotomy and MVR. Pacing leads project over chest. Bibasilar atelectasis greater on LEFT with small LEFT pleural effusion. Upper lungs clear. No pneumothorax. IMPRESSION: Bibasilar atelectasis LEFT greater than RIGHT. Small LEFT pleural effusion. Electronically Signed   By: Lavonia Dana M.D.   On: 05/04/2020 08:18   DG Chest 1 View  Result Date: 05/03/2020 CLINICAL DATA:  Chest tube. Open-heart surgery. Shortness of breath. EXAM: CHEST  1 VIEW COMPARISON:  05/02/2020. FINDINGS: Left IJ sheath in stable position. Bilateral chest tubes in stable position. Cardiomegaly with bilateral mild pulmonary interstitial prominence suggesting pulmonary interstitial edema, new from prior exam. Persistent bibasilar atelectasis. No pleural effusion or pneumothorax. IMPRESSION: 1. Left IJ sheath in stable position. Bilateral chest tubes in stable position. No pneumothorax. 2. Cardiomegaly with new bilateral mild interstitial prominence suggesting pulmonary interstitial edema. Persistent bibasilar atelectasis. Electronically Signed   By: Marcello Moores  Register   On: 05/03/2020 06:25   DG Chest 1 View  Result Date: 05/02/2020 CLINICAL DATA:  Shortness of breath status post surgery. EXAM: CHEST  1 VIEW COMPARISON:  May 01, 2020. FINDINGS: Stable cardiomegaly. Bilateral chest tubes are noted without pneumothorax. Swan-Ganz catheter has been removed. Mild bibasilar subsegmental atelectasis is noted. Bony thorax is unremarkable. IMPRESSION: Bilateral chest tubes are noted without pneumothorax. Mild bibasilar subsegmental atelectasis. Electronically Signed   By: Marijo Conception M.D.   On: 05/02/2020 09:10   DG Shoulder Right  Result Date: 04/17/2020 CLINICAL DATA:  Pain EXAM: RIGHT SHOULDER - 2+ VIEW COMPARISON:  None FINDINGS: Frontal, oblique, and Y scapular  images were obtained. No fracture or dislocation. There is moderate generalized joint space narrowing. No erosive change or intra-articular calcification. Visualized right lung clear. IMPRESSION: Moderate generalized osteoarthritic change. No fracture or dislocation. Electronically Signed   By: Lowella Grip III M.D.   On: 04/17/2020 11:50   DG Wrist Complete Left  Result Date: 04/24/2020 CLINICAL DATA:  Left wrist pain. EXAM: LEFT WRIST - COMPLETE 3+ VIEW COMPARISON:  None. FINDINGS: There is no evidence of fracture or dislocation. There is no evidence of arthropathy or other focal bone abnormality. Soft tissues are unremarkable. IMPRESSION: Negative. Electronically Signed   By: Dorise Bullion III M.D   On: 04/24/2020 15:42   DG Abd 1 View  Result Date: 05/06/2020 CLINICAL DATA:  Nasogastric intubation EXAM: ABDOMEN - 1 VIEW COMPARISON:  05/04/2020 FINDINGS: Nasogastric tube is seen with its tip overlying the expected proximal body of the stomach. Multiple gas-filled loops of large and small bowel are seen within the visualized abdomen suggestive of an adynamic ileus. Pelvis and bilateral flanks are excluded from view. IMPRESSION: Nasogastric tube within the proximal body of the stomach. Electronically Signed   By: Fidela Salisbury MD   On: 05/06/2020 05:43   DG Abd 1 View  Result Date: 05/04/2020 CLINICAL DATA:  Occluded EXAM: ABDOMEN - 1 VIEW COMPARISON:  CT abdomen and pelvis April 22, 2020 FINDINGS: Loops of mildly dilated bowel throughout the abdomen persist without appreciable air-fluid levels. No free air evident on supine imaging. Multiple surgical clips noted throughout the abdomen and upper pelvis. IMPRESSION: Suspect postoperative ileus, although a degree of partial bowel obstruction cannot be excluded in this circumstance. No free air appreciable on supine examination. Multiple surgical clips present. Electronically Signed   By: Lowella Grip III M.D.   On: 05/04/2020 08:52   CT  Head Wo Contrast  Result Date: 04/28/2020 CLINICAL DATA:  Delirium EXAM: CT HEAD WITHOUT CONTRAST TECHNIQUE: Contiguous axial images were obtained from the base of the skull through the vertex without intravenous contrast. COMPARISON:  None. FINDINGS: Brain: Normal anatomic configuration. No abnormal intra or extra-axial mass lesion or fluid collection. No abnormal mass effect or midline shift. No evidence of acute intracranial hemorrhage or infarct. Ventricular size is normal. Cerebellum unremarkable. Vascular: Unremarkable Skull: Intact Sinuses/Orbits: Paranasal sinuses are clear. Orbits are unremarkable. Other: Mastoid air cells and middle ear cavities are clear. IMPRESSION: No acute intracranial abnormality. Electronically Signed   By: Fidela Salisbury MD   On: 04/29/2020 04:18   CT CHEST WO CONTRAST  Result Date: 05/07/2020 CLINICAL DATA:  Cardiac arrest/asystole. Status post CPR. Bowel obstruction suspected. EXAM: CT CHEST, ABDOMEN AND PELVIS WITHOUT CONTRAST TECHNIQUE: Multidetector CT imaging of the chest, abdomen and pelvis was performed following the standard protocol without IV contrast. COMPARISON:  Abdomen/pelvis CT 04/11/2020 FINDINGS: CT CHEST FINDINGS Cardiovascular: Heart size upper normal. Trace pericardial effusion. Status post mitral valve replacement. Right central line tip is positioned in the mid SVC. Left IJ central line tip is in the mid to distal SVC. There is abdominal aortic atherosclerosis without aneurysm. Endotracheal tube tip is positioned in the distal trachea. NG tube tip is in the mid stomach. Mediastinum/Nodes: Scattered small mediastinal lymph nodes evident without lymphadenopathy. Hilar regions obscured by parahilar collapse/consolidative opacity. The esophagus has normal imaging features. Scattered small axillary nodes are evident bilaterally. Lungs/Pleura: Patchy ground-glass and consolidative opacity is identified bilaterally, centrally predominant in the upper lobes  with more confluent collapse/consolidative disease in the lower lungs bilaterally. Small bilateral pleural effusions noted. Pleural drains are evident bilaterally. Musculoskeletal: No worrisome lytic or sclerotic osseous abnormality. Posterior right first rib fracture noted. CT ABDOMEN PELVIS FINDINGS Hepatobiliary: No focal abnormality in the liver on this study without intravenous contrast. There is no evidence for gallstones, gallbladder wall thickening, or pericholecystic fluid. No intrahepatic or extrahepatic biliary dilation. Pancreas: No focal mass lesion. No  dilatation of the main duct. No intraparenchymal cyst. No peripancreatic edema. Spleen: Spleen is markedly heterogeneous. Although assessment is limited by lack of intravenous contrast material, imaging features could be compatible with multiple splenic infarcts. Inferior subcapsular hematoma cannot be excluded. Adrenals/Urinary Tract: No adrenal nodule or mass. Unremarkable noncontrast appearance of the kidneys. No evidence for hydroureter. Bladder decompressed by Foley catheter. Stomach/Bowel: Stomach is decompressed. Duodenum is normally positioned as is the ligament of Treitz. Fluid-filled small bowel loops measure up to 3.6 cm diameter. Small bowel wall cannot be thoroughly assessed given lack of intravenous contrast. Right colon is moderately distended and fluid-filled transverse colon is non dependent and diffusely air-filled with mild distention. No wall thickening in the transverse colon. Left colon is decompressed as is the sigmoid colon. Vascular/Lymphatic: There is abdominal aortic atherosclerosis without aneurysm. Numerous vascular clips are seen in the retroperitoneal space of the abdomen No pelvic sidewall lymphadenopathy. Reproductive: The prostate gland and seminal vesicles are unremarkable. Other: Small to moderate volume pelvic ascites. Musculoskeletal: Diffuse body wall edema noted. No worrisome lytic or sclerotic osseous abnormality.  IMPRESSION: 1. Patchy ground-glass and consolidative opacity bilaterally, centrally predominant in the upper lobes with more confluent collapse/consolidative disease in the lower lungs bilaterally. Imaging features could be related to pulmonary edema or diffuse infection. Small bilateral pleural effusions with bilateral pleural drains in place. 2. Markedly heterogeneous spleen. 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 Inferior subcapsular hematoma cannot be excluded. 3. Fluid-filled minimally distended small bowel loops with mild distention of the right and transverse colon. Imaging features likely related to ileus. 4. Small to moderate volume pelvic ascites. 5. Diffuse body wall edema. 6. Posterior right first rib fracture. 7. Aortic Atherosclerosis (ICD10-I70.0). Electronically Signed   By: Misty Stanley M.D.   On: 05/07/2020 14:16   MR BRAIN WO CONTRAST  Result Date: 04/23/2020 CLINICAL DATA:  Altered mental status EXAM: MRI HEAD WITHOUT CONTRAST TECHNIQUE: Multiplanar, multiecho pulse sequences of the brain and surrounding structures were obtained without intravenous contrast. COMPARISON:  None. FINDINGS: Motion artifact is present. Brain: There are scattered small foci of restricted diffusion in the parietal lobes bilaterally, right greater than left. Additional involvement of the posterior left frontal lobe, left occipitotemporal lobe, and bilateral cerebellum. Minimal punctate foci of susceptibility in the bilateral parietal and right occipital lobes probably reflect chronic microhemorrhages. There is no intracranial mass or significant mass effect. There is no hydrocephalus or extra-axial fluid collection. Ventricles and sulci are normal in size and configuration. Vascular: Major vessel flow voids at the skull base are preserved. Skull and upper cervical spine: Normal marrow signal is preserved.  Sinuses/Orbits: Paranasal sinuses are aerated. Orbits are unremarkable. Other: Sella is unremarkable.  Mastoid air cells are clear. IMPRESSION: Small acute infarcts in the bilateral parietal lobes, posterior left frontal lobe, left occipitotemporal lobe, and bilateral cerebellum. These results will be called to the ordering clinician or representative by the Radiologist Assistant, and communication documented in the PACS or [employer]. Electronically Signed   By: Macy Mis M.D.   On: 04/23/2020 17:43   CT ABDOMEN PELVIS W CONTRAST  Result Date: 04/05/2020 CLINICAL DATA:  Abdominal pain, fever, vomiting EXAM: CT ABDOMEN AND PELVIS WITH CONTRAST TECHNIQUE: Multidetector CT imaging of the abdomen and pelvis was performed using the standard protocol following bolus administration of intravenous contrast. CONTRAST:  161mL OMNIPAQUE IOHEXOL 300 MG/ML  SOLN COMPARISON:  None. FINDINGS: Lower chest: The visualized lung bases  are clear bilaterally. The visualized heart and pericardium are unremarkable. Hepatobiliary: No focal liver abnormality is seen. No gallstones, gallbladder wall thickening, or biliary dilatation. Pancreas: Unremarkable Spleen: There are multiple wedge-shaped areas of hypoattenuation within the upper pole of the spleen likely representing multiple small splenic infarcts. The spleen is of normal size. Splenic artery and vein are patent. Adrenals/Urinary Tract: Adrenal glands are unremarkable. Kidneys are normal, without renal calculi, focal lesion, or hydronephrosis. Bladder is unremarkable. Stomach/Bowel: The stomach, small bowel, and large bowel are unremarkable. The appendix is normal. No free intraperitoneal gas or fluid. Vascular/Lymphatic: The abdominal vasculature is unremarkable. Numerous surgical clips are seen within the retroperitoneum in keeping with retroperitoneal lymph node dissection. The right spermatic cord is absent in keeping with orchiectomy. No pathologic adenopathy  within the abdomen and pelvis. Reproductive: Prostate is unremarkable. Other: Rectum unremarkable.  Tiny fat containing umbilical hernia. Musculoskeletal: No acute bone abnormality. 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 Electronically Signed   By: Fidela Salisbury MD   On: 04/21/2020 04:27   CARDIAC CATHETERIZATION  Result Date: 04/28/2020  Prox Cx lesion is 25% stenosed.  Mid LAD lesion is 10% stenosed.  LV end diastolic pressure is moderately elevated. LVEDP 29 mm Hg.  There is no aortic valve stenosis.  Hemodynamic findings consistent with mild pulmonary hypertension.  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 Continue with plans for mitral valve intervention with Dr. Orvan Seen.   MR WRIST LEFT WO CONTRAST  Result Date: 04/24/2020 CLINICAL DATA:  Question of infection, MSSA bacteremia EXAM: MR OF THE LEFT WRIST WITHOUT CONTRAST TECHNIQUE: Multiplanar, multisequence MR imaging of the left wrist was performed. No intravenous contrast was administered. COMPARISON:  None. FINDINGS: The study is limited due to patient motion. Bones/Joint/Cartilage No definite areas of cortical destruction or periosteal reaction. Normal osseous marrow signal is seen throughout. There is an ulnar minus variance. No large joint effusions are seen. Ligaments Somewhat limited, however the TFCC appears to be intact. The scapholunate and lunotriquetral ligaments are grossly intact. Muscles and Tendons The muscles surrounding the wrist are normal appearance without focal atrophy or tear. The flexor and extensor tendons are intact. Soft tissue Dorsal subcutaneous edema is seen surrounding the wrist. No focal fluid collections or subcutaneous emphysema.  IMPRESSION: Study is somewhat limited due to patient motion. Diffuse dorsal subcutaneous edema and findings of cellulitis. No definite soft tissue abscess or evidence of osteomyelitis. Electronically Signed   By: Prudencio Pair M.D.   On: 04/24/2020 20:36   MR SHOULDER RIGHT WO CONTRAST  Result Date: 04/23/2020 CLINICAL DATA:  Right shoulder pain, sepsis.  Concern for infection EXAM: MRI OF THE RIGHT SHOULDER WITHOUT CONTRAST TECHNIQUE: Multiplanar, multisequence MR imaging of the shoulder was performed. No intravenous contrast was administered. COMPARISON:  X-ray 04/17/2020 FINDINGS: Rotator cuff: Mild tendinosis of the supraspinatus and infraspinatus tendons. Possible tiny insertional tear of the posterior aspect of the infraspinatus tendon. Mild subscapularis tendinosis with low-grade insertional tear. Intact teres minor. No high-grade or full-thickness rotator cuff tear. Muscles: Mild subscapularis intramuscular edema. Preserved bulk of the rotator cuff musculature without atrophy or fatty infiltration. Biceps long head:  Intact.  No tenosynovitis. Acromioclavicular Joint: Moderate-sized acromioclavicular joint effusion. Marked bone marrow edema  involving the acromion process and distal clavicle. There is subchondral irregularity and low T1 signal intensity changes within the distal clavicle and acromion tip suspicious for osteomyelitis (series 8, images 15-17). Marked surrounding soft tissue edema. Complex subacromial-subdeltoid bursal fluid collection (series 7, image 20). Glenohumeral Joint: No glenohumeral joint effusion. No cartilage defect. Labrum:  Suboptimally evaluated. Bones: No acute fracture or dislocation. Marrow edema and T1 signal abnormality centered at the [facility] joint suspicious for osteomyelitis, as above. Other: Marked intramuscular edema within the anterior deltoid muscle with complex multilobulated fluid collection suspicious for intramuscular abscess. Although not well-defined. Collection  measures up to 4.7 x 1.7 cm trans axially along the anterior aspect of the [facility] joint and extends inferiorly within the muscle at least 10 cm in length. Extensive intramuscular edema within the lateral deltoid muscle without organized fluid collection. Multiple prominent axillary lymph nodes. Prominent surrounding subcutaneous edema. IMPRESSION: 1. 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 2. Marked intramuscular edema within the anterior deltoid muscle with complex multilobulated fluid collection suspicious for intramuscular abscess. 3. Complex subacromial-subdeltoid bursal fluid collection suspicious for bursitis, which may be infectious. 4. No evidence to suggest glenohumeral joint septic arthritis. 5. Mild tendinosis of the supraspinatus and infraspinatus tendons. Possible tiny insertional tear of the posterior aspect of the infraspinatus tendon. Mild subscapularis tendinosis with low-grade insertional tear. No high-grade or full-thickness rotator cuff tear. These results will be called to the ordering clinician or representative by the Radiologist Assistant, and communication documented in the PACS or [employer]. Electronically Signed   By: Davina Poke D.O.   On: 04/23/2020 16:45   US Guided Needle Placement  Result Date: 04/19/2020 I briefly attempted diagnostic imaging but he was unable to tolerate positioning. Ultrasound guided injection is preferred based studies that show increased duration, increased effect, greater accuracy, decreased procedural pain, increased response rate, and decreased cost with ultrasound guided versus blind injection.   Verbal informed consent obtained.  Time-out conducted.  Noted no overlying erythema, induration, or other signs of local infection. Ultrasound-guided right glenohumeral injection: After sterile prep with Betadine, injected 8 cc 1% lidocaine without  epinephrine and 40 mg methylprednisolone using a 22-gauge spinal needle, passing the needle from posterior approach into the glenohumeral joint. Injectate was seen filling the joint capsule. He had very good immediate relief.   DG Chest Port 1 View  Result Date: 05/08/2020 CLINICAL DATA:  Respiratory failure EXAM: PORTABLE CHEST 1 VIEW COMPARISON:  Chest x-rays dated 05/07/2020 FINDINGS: Support apparatus appears stable in position. Heart size and mediastinal contours are stable. The bilateral airspace opacities are unchanged. Small bilateral pleural effusions better demonstrated on yesterday's chest CT. Median sternotomy wires in place. IMPRESSION: Stable chest x-ray. Diffuse bilateral airspace opacities are unchanged, compatible with multifocal pneumonia versus pulmonary edema. Electronically Signed   By: Franki Cabot M.D.   On: 05/08/2020 08:55   DG CHEST PORT 1 VIEW  Result Date: 05/07/2020 CLINICAL DATA:  Check central line placement EXAM: PORTABLE CHEST 1 VIEW COMPARISON:  Film from earlier in the same day. FINDINGS: New right jugular temporary dialysis catheter is noted in the mid superior vena cava. No pneumothorax is noted. Endotracheal tube, nasogastric catheter and left jugular central line are again seen and stable. Cardiac shadow is stable. Patchy airspace opacities are noted with right-sided pleural effusion. IMPRESSION: No pneumothorax following right jugular central line placement. Electronically Signed   By: Linus Mako.D.  On: 05/07/2020 11:10   DG Chest Port 1 View  Result Date: 05/07/2020 CLINICAL DATA:  ARDS EXAM: PORTABLE CHEST 1 VIEW COMPARISON:  05/06/2020 FINDINGS: Endotracheal tube, gastric catheter and left jugular central line are again seen and stable. Old pacer wires are again identified. Cardiomegaly is stable. Bilateral airspace opacity is again identified similar to that seen on the prior exam. Bilateral thoracostomy catheters are noted without evidence of  pneumothorax. No bony abnormality is seen. IMPRESSION: Tubes and lines as described above. Persistent bilateral airspace opacities stable from the prior study. No pneumothorax is noted. Electronically Signed   By: Inez Catalina M.D.   On: 05/07/2020 08:51   DG CHEST PORT 1 VIEW  Result Date: 05/06/2020 CLINICAL DATA:  Central placement. EXAM: PORTABLE CHEST 1 VIEW COMPARISON:  One-view chest x-ray 05/06/2020 FINDINGS: Heart is enlarged. Endotracheal tube is stable, 4 cm above the carina. Pacing wires are stable. Enteric tube courses off the inferior border the film. A new left IJ line is in place. The tip is at the cavoatrial junction. Interstitial edema has increased slightly. Bilateral pleural effusions are noted. Bilateral chest tubes are in place. No pneumothorax is present. IMPRESSION: 1. Interval placement of left IJ line with the tip at the cavoatrial junction. 2. Slight increase in interstitial edema. 3. Stable bilateral pleural effusions. 4. Stable support apparatus otherwise. Electronically Signed   By: San Morelle M.D.   On: 05/06/2020 09:23   DG CHEST PORT 1 VIEW  Result Date: 05/06/2020 CLINICAL DATA:  Intubation EXAM: PORTABLE CHEST 1 VIEW COMPARISON:  Two days ago FINDINGS: Endotracheal tube with tip between the clavicular heads and carina. The left IJ sheath has been removed. There is an enteric tube which at least reaches the stomach. Direct pacer leads without battery pack. Chest tubes in place. Low volume chest with atelectasis/vascular congestion that is increased. Stable cardiopericardial enlargement. IMPRESSION: 1. Unremarkable hardware positioning. 2. Increased vascular congestion or atelectasis. No visible pneumothorax. Electronically Signed   By: Monte Fantasia M.D.   On: 05/06/2020 05:36   DG Chest Port 1 View  Result Date: 05/01/2020 CLINICAL DATA:  Status post mitral valve replacement EXAM: PORTABLE CHEST 1 VIEW COMPARISON:  April 30, 2020 FINDINGS: Endotracheal  tube and nasogastric tube have been removed. Swan-Ganz catheter tip is in the main pulmonary outflow tract. Left chest tube and mediastinal drains unchanged in position. Pacemaker wires remain with pacemaker device no longer present. Status post mitral valve replacement. No pneumothorax. There is bibasilar atelectasis. Lungs otherwise clear. Heart is enlarged, stable, with pulmonary vascularity normal. No adenopathy. No bone lesions. IMPRESSION: Tube and catheter positions as described without pneumothorax. Bibasilar atelectasis, essentially stable. Stable cardiac prominence. Electronically Signed   By: Lowella Grip III M.D.   On: 05/01/2020 07:55   DG Chest Port 1 View  Result Date: 04/07/2020 CLINICAL DATA:  Status post mitral valve repair. EXAM: PORTABLE CHEST 1 VIEW COMPARISON:  April 22, 2020 FINDINGS: Post intubation, satisfactory position of the endotracheal tube. Swan-Ganz catheter terminates at the expected location of the pulmonary outflow tract. Artificial mitral valve noted. Mediastinal and chest drains in place. Enteric catheter in place. Cardiomediastinal silhouette is normal. Mediastinal contours appear intact. There is no evidence of focal airspace consolidation, pleural effusion or pneumothorax. Low lung volumes. Osseous structures are without acute abnormality. Soft tissues are grossly normal. IMPRESSION: 1. Post intubation, satisfactory position of the endotracheal tube. 2. Low lung volumes. Electronically Signed   By: Fidela Salisbury M.D.   On: 04/27/2020  16:06   DG Chest Port 1 View  Result Date: 04/16/2020 CLINICAL DATA:  Sepsis EXAM: PORTABLE CHEST 1 VIEW COMPARISON:  None. FINDINGS: Lungs volumes are small, and there is mild left basilar atelectasis. No pneumothorax or pleural effusion. Cardiac size within normal limits. Pulmonary vascularity is normal. Osseous structures are age-appropriate. No acute bone abnormality. IMPRESSION: No active disease. Electronically Signed    By: Fidela Salisbury MD   On: 04/06/2020 02:55   [place] LT LOWER EXTREM LTD SOFT TISSUE NON VASCULAR  Result Date: 05/03/2020 CLINICAL DATA:  Follow-up left great toe lesion EXAM: ULTRASOUND  LEFT FIRST TOE TECHNIQUE: Ultrasound examination of the lower extremity soft tissues was performed in the area of clinical concern. COMPARISON:  Ultrasound April 24, 2020. FINDINGS: Decreased size of the complex fluid collection along the medial aspect of left great toe which now measures 2.2 x 2.2 x 1.3 cm previously 2.4 x 2.3 x 1.5 cm. IMPRESSION: Interval decrease in size of the phlegmonous collection/developing abscess along the medial aspect of the left great toe. Electronically Signed   By: Dahlia Bailiff MD   On: 05/03/2020 12:47   [place] LT LOWER EXTREM LTD SOFT TISSUE NON VASCULAR  Result Date: 05/01/2020 CLINICAL DATA:  Concern for abscess EXAM: ULTRASOUND LEFT FIRST TOE TECHNIQUE: Longitudinal and transverse images of the left first toe obtained. COMPARISON:  None. FINDINGS: Along the medial aspect of the left first toe at site of palpable fullness, there is a complex fluid collection measuring 2.3 x 2.4 x 1.5 cm. No other lesion evident in this area. IMPRESSION: Apparent abscess along the medial aspect of the left first toe measuring 2.3 x 2.4 x 1.5 cm. No other lesion evident by ultrasound in this region. Electronically Signed   By: Lowella Grip III M.D.   On: 05/01/2020 13:23   ECHO TEE  Result Date: 04/30/2020    TRANSESOPHOGEAL ECHO REPORT   Patient Name:   DOREN KASPAR Date of Exam: 04/27/2020 Medical Rec #:  [id number]      Height:       75.0 in Accession #:    [id number]     Weight:       265.0 lb Date of Birth:  [birth year]     BSA:          2.473 m Patient Age:    55 years       BP:           146/114 mmHg Patient Gender: M              HR:           104 bpm. Exam Location:  Inpatient Procedure: Transesophageal Echo Indications:    Bacteremia 790.7 / R78.81  History:        Patient has prior  history of Echocardiogram examinations, most                 recent 04/04/2020. Risk Factors:Diabetes.  Sonographer:    Bernadene Person RDCS Referring Phys: [id number] CADENCE H FURTH PROCEDURE: After discussion of the risks and benefits of a TEE, an informed consent was obtained from the patient. The transesophogeal probe was passed without difficulty through the esophogus of the patient. Local oropharyngeal anesthetic was provided with Cetacaine. Sedation performed by different physician. The patient was monitored while under deep sedation. Anesthestetic sedation was provided intravenously by Anesthesiology: 400.5mg  of Propofol, 100mg  of Lidocaine. The patient's vital signs; including heart rate, blood pressure, and oxygen saturation; remained stable throughout  the procedure. The patient developed no complications during the procedure. IMPRESSIONS  1. There is a large echodensity that is partially fixed (below the valve on the ventricular side and possibly representing an abscess) and partially hypermobile, multilobar ( on the left atrial side) consistent with a vegetation with high risk for embolization. This involves P2,3 and A3 leaflets. Vegetation size is 4.4 x 2.4 cm.  2. Left ventricular ejection fraction, by estimation, is 55 to 60%. The left ventricle has normal function. The left ventricle has no regional wall motion abnormalities. Left ventricular diastolic function could not be evaluated.  3. Right ventricular systolic function is normal. The right ventricular size is normal.  4. No left atrial/left atrial appendage thrombus was detected.  5. The mitral valve is normal in structure. Mild mitral valve regurgitation. No evidence of mitral stenosis.  6. The aortic valve is normal in structure. Aortic valve regurgitation is not visualized. No aortic stenosis is present.  7. The inferior vena cava is normal in size with greater than 50% respiratory variability, suggesting right atrial pressure of 3 mmHg.  Conclusion(s)/Recommendation(s): Normal biventricular function without evidence of hemodynamically significant valvular heart disease. CT surgery and hospitalist Dr Avon Gully were notified at 1:10 pm.The patient will be transferred to the [facility].  FINDINGS  Left Ventricle: Left ventricular ejection fraction, by estimation, is 55 to 60%. The left ventricle has normal function. The left ventricle has no regional wall motion abnormalities. The left ventricular internal cavity size was normal in size. There is  no left ventricular hypertrophy. Left ventricular diastolic function could not be evaluated. Right Ventricle: The right ventricular size is normal. No increase in right ventricular wall thickness. Right ventricular systolic function is normal. Left Atrium: Left atrial size was normal in size. No left atrial/left atrial appendage thrombus was detected. Right Atrium: Right atrial size was normal in size. Pericardium: There is no evidence of pericardial effusion. Mitral Valve: There is a large echodensity that is partially fixed (below the valve on the ventricular side and possibly representing an abscess) and partially hypermobile, multilobar ( on the left atrial side) consistent with a vegetation with high risk  for embolization. This involves P2,3 and A3 leaflets. The mitral valve is normal in structure. Mild mitral valve regurgitation. No evidence of mitral valve stenosis. Tricuspid Valve: The tricuspid valve is normal in structure. Tricuspid valve regurgitation is mild . No evidence of tricuspid stenosis. There is no evidence of tricuspid valve vegetation. Aortic Valve: The aortic valve is normal in structure. Aortic valve regurgitation is not visualized. No aortic stenosis is present. There is no evidence of aortic valve vegetation. Pulmonic Valve: The pulmonic valve was normal in structure. Pulmonic valve regurgitation is not visualized. No evidence of pulmonic stenosis. There is no evidence of pulmonic valve  vegetation. Aorta: The aortic root is normal in size and structure. There is minimal (Grade I) plaque. Venous: The inferior vena cava is normal in size with greater than 50% respiratory variability, suggesting right atrial pressure of 3 mmHg. IAS/Shunts: The interatrial septum appears to be lipomatous. No atrial level shunt detected by color flow Doppler. There is no evidence of an atrial septal defect. [name] MD Electronically signed by [name] MD Signature Date/Time: 04/30/2020/1:29:48 PM    Final    VAS US RENAL ARTERY DUPLEX  Result Date: 05/03/2020 ABDOMINAL VISCERAL Indications: Endocarditis, acute kidney injury concern for embolization to renal              artery. High Risk Factors:  Hypertension, hyperlipidemia, Diabetes, coronary artery                    disease. 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 Limitations: Air/bowel gas and obesity. Performing Technologist: Oda Cogan RDMS, RVT  Examination Guidelines: A complete evaluation includes B-mode imaging, spectral Doppler, color Doppler, and power Doppler as needed of all accessible portions of each vessel. Bilateral testing is considered an integral part of a complete examination. Limited examinations for reoccurring indications may be performed as noted.  Duplex Findings: +--------------------+--------+--------+------+--------------+ Mesenteric          PSV cm/sEDV cm/sPlaque   Comments    +--------------------+--------+--------+------+--------------+ Aorta Prox                                not visualized +--------------------+--------+--------+------+--------------+ Celiac Artery Origin                      not visualized +--------------------+--------+--------+------+--------------+ SMA Proximal                              not visualized +--------------------+--------+--------+------+--------------+   +------------------+--------+--------+--------+ Right Renal ArteryPSV cm/sEDV cm/sComment   +------------------+--------+--------+--------+ Origin                            not seen +------------------+--------+--------+--------+ Proximal             70      17            +------------------+--------+--------+--------+ Mid                  46      19            +------------------+--------+--------+--------+ Distal               66      19            +------------------+--------+--------+--------+ +-----------------+--------+--------+--------+ Left Renal ArteryPSV cm/sEDV cm/sComment  +-----------------+--------+--------+--------+ Origin                           not seen +-----------------+--------+--------+--------+ Proximal                         not seen +-----------------+--------+--------+--------+ Mid                 70      25            +-----------------+--------+--------+--------+ Distal              54      17            +-----------------+--------+--------+--------+  Technologist observations: Technically difficult study due to patient's body habitus and excessive air and gas. +------------+--------+--------+----+-----------+--------+--------+----+ Right KidneyPSV cm/sEDV cm/sRI  Left KidneyPSV cm/sEDV cm/sRI   +------------+--------+--------+----+-----------+--------+--------+----+ Upper Pole                      Upper Pole                      +------------+--------+--------+----+-----------+--------+--------+----+ Mid         36      11      0.69Mid        38  11      0.72 +------------+--------+--------+----+-----------+--------+--------+----+ Lower Pole  23      8       0.65Lower Pole 25      9       0.64 +------------+--------+--------+----+-----------+--------+--------+----+ Hilar       17      6       0.63Hilar                           +------------+--------+--------+----+-----------+--------+--------+----+ +------------------+-----+------------------+-----+ Right Kidney           Left  Kidney             +------------------+-----+------------------+-----+ RAR                    RAR                     +------------------+-----+------------------+-----+ RAR (manual)           RAR (manual)            +------------------+-----+------------------+-----+ Cortex                 Cortex                  +------------------+-----+------------------+-----+ Cortex thickness       Corex thickness         +------------------+-----+------------------+-----+ Kidney length (cm)11.25Kidney length (cm)13.00 +------------------+-----+------------------+-----+  Summary: Renal:  Right: Patent renal artery. Left:  Patent renal artery.  *See table(s) above for measurements and observations.  Diagnosing physician: Deitra Mayo MD  Electronically signed by Deitra Mayo MD on 05/03/2020 at 7:56:09 PM.    Final    VAS [place] UPPER EXTREMITY VENOUS DUPLEX  Result Date: 04/23/2020 UPPER VENOUS STUDY  Indications: Swelling, and Edema Comparison Study: no prior Performing Technologist: Abram Sander RVS  Examination Guidelines: A complete evaluation includes B-mode imaging, spectral Doppler, color Doppler, and power Doppler as needed of all accessible portions of each vessel. Bilateral testing is considered an integral part of a complete examination. Limited examinations for reoccurring indications may be performed as noted.  Left Findings: +----------+------------+---------+-----------+----------+-------+ LEFT      CompressiblePhasicitySpontaneousPropertiesSummary +----------+------------+---------+-----------+----------+-------+ IJV           Full       Yes       Yes                      +----------+------------+---------+-----------+----------+-------+ Subclavian    Full       Yes       Yes                      +----------+------------+---------+-----------+----------+-------+ Axillary      Full       Yes       Yes                       +----------+------------+---------+-----------+----------+-------+ Brachial      Full       Yes       Yes                      +----------+------------+---------+-----------+----------+-------+ Radial        Full                                          +----------+------------+---------+-----------+----------+-------+  Ulnar         Full                                          +----------+------------+---------+-----------+----------+-------+ Cephalic      Full                                          +----------+------------+---------+-----------+----------+-------+ Basilic       Full                                          +----------+------------+---------+-----------+----------+-------+  Summary:  Left: No evidence of deep vein thrombosis in the upper extremity. No evidence of superficial vein thrombosis in the upper extremity. No evidence of thrombosis in the subclavian.  *See table(s) above for measurements and observations.  Diagnosing physician: Deitra Mayo MD Electronically signed by Deitra Mayo MD on 04/23/2020 at 7:52:58 PM.    Final    ECHOCARDIOGRAM LIMITED  Result Date: 05/08/2020    ECHOCARDIOGRAM LIMITED REPORT   Patient Name:   Jonathan Shaffer Date of Exam: 05/08/2020 Medical Rec #:  [id number]      Height:       75.0 in Accession #:    [id number]     Weight:       307.5 lb Date of Birth:  1966/02/02     BSA:          2.635 m Patient Age:    6 years       BP:           96/57 mmHg Patient Gender: M              HR:           79 bpm. Exam Location:  Inpatient Procedure: Limited Echo, Limited Color Doppler and Cardiac Doppler STAT ECHO Indications:    mitral vavle insufficiency 424.0  History:        Patient has prior history of Echocardiogram examinations, most                 recent 05/06/2020. Sepsis; Risk Factors:Diabetes.                  Mitral Valve: 33 mm On-X mechanical valve valve is present in                 the mitral position.  Procedure Date: 04/13/2020.  Sonographer:    Johny Chess Referring Phys: [id number] Candee Furbish  Sonographer Comments: Echo performed with patient supine and on artificial respirator and Technically difficult study due to poor echo windows. Image acquisition challenging due to patient body habitus. IMPRESSIONS  1. Left ventricular ejection fraction, by estimation, is 55 to 60%. The left ventricle has normal function.  2. Right ventricular systolic function was not well visualized. The right ventricular size is not well visualized.  3. The mitral valve has been repaired/replaced. The mean mitral valve gradient is 3.0 mmHg. There is a 33 mm On-X mechanical valve present in the mitral position. Procedure Date: 04/24/2020. Echo findings are consistent with normal structure and function of the mitral valve prosthesis.  4. The aortic valve is grossly normal. Aortic valve  regurgitation is not visualized. Conclusion(s)/Recommendation(s): I did not appreciate the previously commented on wall motion abnormality in the basal inferior wall, though per prior echo this appeared normal with contrast. Apical images are somewhat difficult to assess wall motion, but parasternal images show normal EF and normal wall motion. The recently placed mitral valve appears well seated without significant abnormalities. FINDINGS  Left Ventricle: Left ventricular ejection fraction, by estimation, is 55 to 60%. The left ventricle has normal function. Abnormal (paradoxical) septal motion consistent with post-operative status. Right Ventricle: The right ventricular size is not well visualized. Right ventricular systolic function was not well visualized. Left Atrium: Left atrial size was not well visualized. Right Atrium: Right atrial size was not well visualized. Pericardium: The pericardium was not well visualized. Mitral Valve: The mitral valve has been repaired/replaced. There is a 33 mm On-X mechanical valve present in the mitral  position. Procedure Date: 04/26/2020. Echo findings are consistent with normal structure and function of the mitral valve prosthesis. MV peak gradient, 10.4 mmHg. The mean mitral valve gradient is 3.0 mmHg. Tricuspid Valve: The tricuspid valve is grossly normal. Tricuspid valve regurgitation is trivial. Aortic Valve: The aortic valve is grossly normal. Aortic valve regurgitation is not visualized. Pulmonic Valve: The pulmonic valve was not well visualized. Venous: The inferior vena cava was not well visualized. MITRAL VALVE MV Area (PHT): 2.53 cm MV Peak grad:  10.4 mmHg MV Mean grad:  3.0 mmHg MV Vmax:       1.61 m/s MV Vmean:      71.5 cm/s Buford Dresser MD Electronically signed by Buford Dresser MD Signature Date/Time: 05/08/2020/11:49:22 AM    Final    ECHOCARDIOGRAM LIMITED  Result Date: 05/06/2020    ECHOCARDIOGRAM LIMITED REPORT   Patient Name:   Jonathan Shaffer Date of Exam: 05/06/2020 Medical Rec #:  [id number]      Height:       75.0 in Accession #:    [id number]     Weight:       298.5 lb Date of Birth:  1965/08/31     BSA:          2.602 m Patient Age:    52 years       BP:           103/53 mmHg Patient Gender: M              HR:           97 bpm. Exam Location:  Inpatient Procedure: Limited Echo Indications:    Pericardial effusion  History:        Patient has prior history of Echocardiogram examinations, most                 recent 04/20/2020. Risk Factors:Diabetes. S/P mitral valve                 repair. H/O endiocarditis of mitral valve. Bacteremia.  Sonographer:    Clayton Lefort RDCS (AE) Referring Phys: [id number] Candee Furbish  Sonographer Comments: Technically difficult study due to poor echo windows, suboptimal parasternal window, suboptimal apical window, patient is morbidly obese and echo performed with patient supine and on artificial respirator. Image acquisition challenging due to patient body habitus. IMPRESSIONS  1. The echo is technically difficult with poor image quality. I  was initially concerned with possible akinesis of the inferobasal segments but with Definity contrast, the LV is seen to contract normally . Marland Kitchen Left ventricular ejection fraction, by estimation, is 60  to 65%. The left ventricle has normal function. The left ventricle demonstrates regional wall motion abnormalities (see scoring diagram/findings for description). There is severe akinesis of the left ventricular, basal inferior wall.  2. The aortic valve is grossly normal. Aortic valve regurgitation is not visualized. No aortic stenosis is present. FINDINGS  Left Ventricle: The echo is technically difficult with poor image quality. I was initially concerned with possible akinesis of the inferobasal segments but with Definity contrast, the LV is seen to contract normally. Left ventricular ejection fraction, by estimation, is 60 to 65%. The left ventricle has normal function. The left ventricle demonstrates regional wall motion abnormalities. Severe akinesis of the left ventricular, basal inferior wall. Definity contrast agent was given IV to delineate the left ventricular endocardial borders. The left ventricular internal cavity size was normal in size. Aortic Valve: The aortic valve is grossly normal. Aortic valve regurgitation is not visualized. No aortic stenosis is present. Mertie Moores MD Electronically signed by Mertie Moores MD Signature Date/Time: 05/06/2020/10:46:09 AM    Final    ECHOCARDIOGRAM LIMITED  Result Date: 04/09/2020    ECHOCARDIOGRAM LIMITED REPORT   Patient Name:   Jonathan Shaffer Date of Exam: 04/23/2020 Medical Rec #:  [id number]      Height:       75.0 in Accession #:    [id number]     Weight:       279.5 lb Date of Birth:  [birth year]     BSA:          2.530 m Patient Age:    37 years       BP:           163/86 mmHg Patient Gender: M              HR:           130 bpm. Exam Location:  Inpatient Procedure: Limited Echo and Limited Color Doppler Indications:    Fever R50.9  History:         Patient has no prior history of Echocardiogram examinations.                 Risk Factors:Diabetes.  Sonographer:    Mikki Santee RDCS (AE) Referring Phys: [id number] CHRISTOPHER P DANFORD IMPRESSIONS  1. Left ventricular ejection fraction, by estimation, is 55 to 60%. The left ventricle has normal function. The left ventricle has no regional wall motion abnormalities. Left ventricular diastolic function could not be evaluated.  2. The mitral valve is grossly normal. No evidence of mitral valve regurgitation. No evidence of mitral stenosis.  3. The aortic valve is tricuspid. Aortic valve regurgitation is not visualized. No aortic stenosis is present. FINDINGS  Left Ventricle: Left ventricular ejection fraction, by estimation, is 55 to 60%. The left ventricle has normal function. The left ventricle has no regional wall motion abnormalities. The left ventricular internal cavity size was normal in size. There is  no left ventricular hypertrophy. Left ventricular diastolic function could not be evaluated. Pericardium: Trivial pericardial effusion is present. Mitral Valve: The mitral valve is grossly normal. No evidence of mitral valve stenosis. Tricuspid Valve: The tricuspid valve is grossly normal. Tricuspid valve regurgitation is not demonstrated. No evidence of tricuspid stenosis. Aortic Valve: The aortic valve is tricuspid. Aortic valve regurgitation is not visualized. No aortic stenosis is present. Pulmonic Valve: The pulmonic valve was not assessed. Eleonore Chiquito MD Electronically signed by Eleonore Chiquito MD Signature Date/Time: 04/28/2020/1:36:17 PM    Final  [place] EKG SITE RITE  Result Date: 05/06/2020 If Site Rite image not attached, placement could not be confirmed due to current cardiac rhythm.  [place] EKG SITE RITE  Result Date: 04/09/2020 If Site Rite image not attached, placement could not be confirmed due to current cardiac rhythm.   Microbiology Recent Results (from the past 240 hour(s))   Aerobic Culture (superficial specimen)     Status: None   Collection Time: 05/07/20  4:39 PM   Specimen: Wound  Result Value Ref Range Status   Specimen Description WOUND RIGHT SHOULDER  Final   Special Requests NONE  Final   Gram Stain   Final    RARE WBC PRESENT, PREDOMINANTLY PMN FEW GRAM POSITIVE COCCI IN PAIRS IN CHAINS    Culture   Final    NO GROWTH 2 DAYS Performed at [facility], [address], [place], [place] [address]    Report Status 05/10/2020 FINAL  Final  Culture, blood (routine x 2)     Status: None (Preliminary result)   Collection Time: 05/08/20 12:15 PM   Specimen: BLOOD  Result Value Ref Range Status   Specimen Description BLOOD RIGHT ANTECUBITAL  Final   Special Requests   Final    BOTTLES DRAWN AEROBIC AND ANAEROBIC Blood Culture results may not be optimal due to an inadequate volume of blood received in culture bottles   Culture   Final    NO GROWTH 3 DAYS Performed at [facility], [place] [address] [place], [place] [address]    Report Status PENDING  Incomplete  Culture, blood (routine x 2)     Status: None (Preliminary result)   Collection Time: 05/08/20 12:27 PM   Specimen: BLOOD LEFT HAND  Result Value Ref Range Status   Specimen Description BLOOD LEFT HAND  Final   Special Requests   Final    BOTTLES DRAWN AEROBIC ONLY Blood Culture results may not be optimal due to an inadequate volume of blood received in culture bottles   Culture   Final    NO GROWTH 3 DAYS Performed at [facility], [place] [address], [place], [place] [address]    Report Status PENDING  Incomplete    Lab Basic Metabolic Panel: Recent Labs  Lab 05/06/20 0053 05/06/20 0537 05/07/20 0206 05/07/20 1111 05/07/20 1639 05/08/20 0035 05/08/20 1003 05/08/20 1701 2020/05/13 0247  NA 137   < > 138   < > 139 135 133* 136 133*  134*  K 3.2*   < > 4.7   < > 5.5* 6.0* 5.6* 5.8* 5.9*  6.2*  CL 99   < > 98  --  101 99 97* 98 98  CO2 24   < > 20*  --  21* 16*  15* 17* 15*  GLUCOSE 107*   < > 85  --  99 99 141* 120* 155*  BUN 33*   < > 39*  --  46* 38* 33* 30* 30*  CREATININE 3.06*   < > 4.27*  --  4.80* 4.27* 3.41* 2.99* 2.81*  CALCIUM 7.6*   < > 7.0*  --  6.6* 6.6* 6.4* 6.6* 6.7*  MG  --   --   --   --   --  3.0* 3.2*  --  2.8*  PHOS 6.7*  --  10.4*  --  11.9*  --  10.4* 9.4*  --    < > = values in this interval not displayed.   Liver Function  Tests: Recent Labs  Lab 05/06/20 0053 05/06/20 0053 05/07/20 0206 05/07/20 0206 05/07/20 1639 05/08/20 0035 05/08/20 1003 05/08/20 1701 26-May-2020 0247  AST 118*  --  111*  --   --  767* 3,046*  --  3,457*  ALT 270*  --  140*  --   --  238* 572*  --  688*  ALKPHOS 316*  --  199*  --   --  249* 253*  --  288*  BILITOT 2.7*  --  5.5*  --   --  7.8* 8.8*  --  8.6*  PROT 6.9  --  6.2*  --   --  6.8 6.6  --  6.4*  ALBUMIN 1.6*  1.6*   < > 2.3*  2.3*   < > 2.2* 2.3* 2.1* 2.1* 2.0*   < > = values in this interval not displayed.   No results for input(s): LIPASE, AMYLASE in the last 168 hours. No results for input(s): AMMONIA in the last 168 hours. CBC: Recent Labs  Lab 05/05/20 0313 05/05/20 0313 05/06/20 0053 05/06/20 0537 05/06/20 1335 05/06/20 1335 05/07/20 0206 05/07/20 1111 05/08/20 0035 05/08/20 1210 May 26, 2020 0247  WBC 23.5*   < > 22.5*   < > 14.5*  --  37.2*  --  42.7* 43.4* 40.4*  NEUTROABS 20.3*  --  19.8*  --   --   --  34.3*  --   --   --   --   HGB 9.6*   < > 10.5*   < > 8.9*   < > 8.3* 8.8* 8.8* 8.8* 9.2*  10.9*  HCT 28.8*   < > 32.1*   < > 26.8*   < > 26.1* 26.0* 29.3* 28.6* 28.7*  32.0*  MCV 90.9   < > 90.4   < > 92.7  --  93.9  --  100.0 100.4* 98.0  PLT 334   < > 407*   < > 322  --  292  --  294 245 206   < > = values in this interval not displayed.   Cardiac Enzymes: No results for input(s): CKTOTAL, CKMB, CKMBINDEX, TROPONINI in the last 168 hours. Sepsis Labs: Recent Labs  Lab 05/07/20 0206 05/08/20 0035 05/08/20 1003 05/08/20 1210 2020/05/26 0247  WBC  37.2* 42.7*  --  43.4* 40.4*  LATICACIDVEN  --   --  9.5*  --  9.1*     Candee Furbish 05/11/2020, 5:21 PM

## 2020-06-04 NOTE — Progress Notes (Signed)
eLink Physician-Brief Progress Note Patient Name: Jonathan Shaffer DOB: [birth year] MRN: [id number]   Date of Service  2020/05/27  HPI/Events of Note  Notified of hypoglycemic episodes, increased pressor requirement, hypothermia. Ongoing CRRT, On D10 at 10 cc/hr  eICU Interventions  ABG and lactic acid level ordered.  May need bicarb. Electrolytes pending, suspect hyperkalemia as well     Intervention Category Major Interventions: Other:  [name] 05-27-2020, 2:51 AM

## 2020-06-04 NOTE — Progress Notes (Signed)
eLink Physician-Brief Progress Note Patient Name: Jonathan Shaffer DOB: 01/26/1966 MRN: [id number]   Date of Service  2020-05-26  HPI/Events of Note  Notified of K 6.2 ABG 7.33/33/136 CRRT ongoing  eICU Interventions  Ordered hyperkalemia protocol, calcium, D50/insulin and bicarb push. Also started bicarb drip Nephrology to be updated of results as well     Intervention Category Major Interventions: Acid-Base disturbance - evaluation and management;Electrolyte abnormality - evaluation and management  [name] 2020/05/26, 3:28 AM

## 2020-06-04 NOTE — Progress Notes (Signed)
[facility] referral initiated  Referral number: [id number]

## 2020-06-04 NOTE — Progress Notes (Signed)
eLink Physician-Brief Progress Note Patient Name: Jonathan Shaffer DOB: 11/06/1965 MRN: [id number]   Date of Service  2020/05/23  HPI/Events of Note  Wife at bedside and requested patient to be made DNR. Explained to her that there will be no chest compressions or defibrillation in the event of a cardiac arrest. They would still want to continue medical therapies at this time until the rest of the family can visit.  eICU Interventions  DNR ordered Nephrology service to be updated     Intervention Category Minor Interventions: Communication with other healthcare providers and/or family  [name] 05-23-2020, 5:11 AM

## 2020-06-04 DEATH — deceased
